# Patient Record
Sex: Female | Born: 1940
Health system: Southern US, Community
[De-identification: ages and names within clinical notes are randomized; demographics above are authoritative.]

## PROBLEM LIST (undated history)

## (undated) DIAGNOSIS — M810 Age-related osteoporosis without current pathological fracture: Secondary | ICD-10-CM

## (undated) DIAGNOSIS — J189 Pneumonia, unspecified organism: Secondary | ICD-10-CM

## (undated) DIAGNOSIS — H409 Unspecified glaucoma: Secondary | ICD-10-CM

## (undated) DIAGNOSIS — J449 Chronic obstructive pulmonary disease, unspecified: Secondary | ICD-10-CM

## (undated) DIAGNOSIS — K635 Polyp of colon: Secondary | ICD-10-CM

## (undated) DIAGNOSIS — E785 Hyperlipidemia, unspecified: Secondary | ICD-10-CM

## (undated) DIAGNOSIS — K2270 Barrett's esophagus without dysplasia: Secondary | ICD-10-CM

## (undated) DIAGNOSIS — I82409 Acute embolism and thrombosis of unspecified deep veins of unspecified lower extremity: Secondary | ICD-10-CM

## (undated) DIAGNOSIS — K269 Duodenal ulcer, unspecified as acute or chronic, without hemorrhage or perforation: Secondary | ICD-10-CM

## (undated) DIAGNOSIS — K8020 Calculus of gallbladder without cholecystitis without obstruction: Secondary | ICD-10-CM

## (undated) DIAGNOSIS — H3520 Other non-diabetic proliferative retinopathy, unspecified eye: Secondary | ICD-10-CM

## (undated) DIAGNOSIS — K219 Gastro-esophageal reflux disease without esophagitis: Secondary | ICD-10-CM

## (undated) DIAGNOSIS — K559 Vascular disorder of intestine, unspecified: Secondary | ICD-10-CM

## (undated) DIAGNOSIS — N281 Cyst of kidney, acquired: Secondary | ICD-10-CM

## (undated) HISTORY — DX: Acute embolism and thrombosis of unspecified deep veins of unspecified lower extremity: I82.409

## (undated) HISTORY — DX: Hyperlipidemia, unspecified: E78.5

## (undated) HISTORY — DX: Age-related osteoporosis without current pathological fracture: M81.0

## (undated) HISTORY — DX: Barrett's esophagus without dysplasia: K22.70

## (undated) HISTORY — PX: FEMORAL-POPLITEAL BYPASS GRAFT: SHX937

## (undated) HISTORY — DX: Polyp of colon: K63.5

## (undated) HISTORY — DX: Duodenal ulcer, unspecified as acute or chronic, without hemorrhage or perforation: K26.9

## (undated) HISTORY — DX: Calculus of gallbladder without cholecystitis without obstruction: K80.20

## (undated) HISTORY — DX: Vascular disorder of intestine, unspecified: K55.9

## (undated) HISTORY — PX: OTHER SURGICAL HISTORY: SHX169

## (undated) HISTORY — DX: Chronic obstructive pulmonary disease, unspecified: J44.9

## (undated) HISTORY — DX: Cyst of kidney, acquired: N28.1

## (undated) HISTORY — PX: CHOLECYSTECTOMY: SHX55

## (undated) HISTORY — DX: Unspecified glaucoma: H40.9

## (undated) HISTORY — DX: Other non-diabetic proliferative retinopathy, unspecified eye: H35.20

---

## 2014-04-23 DIAGNOSIS — E1159 Type 2 diabetes mellitus with other circulatory complications: Secondary | ICD-10-CM | POA: Diagnosis not present

## 2014-04-30 DIAGNOSIS — L852 Keratosis punctata (palmaris et plantaris): Secondary | ICD-10-CM | POA: Diagnosis not present

## 2014-04-30 DIAGNOSIS — M2041 Other hammer toe(s) (acquired), right foot: Secondary | ICD-10-CM | POA: Diagnosis not present

## 2014-04-30 DIAGNOSIS — I70203 Unspecified atherosclerosis of native arteries of extremities, bilateral legs: Secondary | ICD-10-CM | POA: Diagnosis not present

## 2014-04-30 DIAGNOSIS — B351 Tinea unguium: Secondary | ICD-10-CM | POA: Diagnosis not present

## 2014-05-01 DIAGNOSIS — I70219 Atherosclerosis of native arteries of extremities with intermittent claudication, unspecified extremity: Secondary | ICD-10-CM | POA: Diagnosis not present

## 2014-05-01 DIAGNOSIS — E1159 Type 2 diabetes mellitus with other circulatory complications: Secondary | ICD-10-CM | POA: Diagnosis not present

## 2014-05-01 DIAGNOSIS — J439 Emphysema, unspecified: Secondary | ICD-10-CM | POA: Diagnosis not present

## 2014-05-01 DIAGNOSIS — H4011X0 Primary open-angle glaucoma, stage unspecified: Secondary | ICD-10-CM | POA: Diagnosis not present

## 2014-05-04 DIAGNOSIS — D751 Secondary polycythemia: Secondary | ICD-10-CM | POA: Diagnosis not present

## 2014-05-04 DIAGNOSIS — D45 Polycythemia vera: Secondary | ICD-10-CM | POA: Diagnosis not present

## 2014-05-14 DIAGNOSIS — D45 Polycythemia vera: Secondary | ICD-10-CM | POA: Diagnosis not present

## 2014-05-17 DIAGNOSIS — H40003 Preglaucoma, unspecified, bilateral: Secondary | ICD-10-CM | POA: Diagnosis not present

## 2014-05-17 DIAGNOSIS — H43393 Other vitreous opacities, bilateral: Secondary | ICD-10-CM | POA: Diagnosis not present

## 2014-05-17 DIAGNOSIS — H2512 Age-related nuclear cataract, left eye: Secondary | ICD-10-CM | POA: Diagnosis not present

## 2014-05-17 DIAGNOSIS — Z961 Presence of intraocular lens: Secondary | ICD-10-CM | POA: Diagnosis not present

## 2014-05-17 DIAGNOSIS — R1031 Right lower quadrant pain: Secondary | ICD-10-CM | POA: Diagnosis not present

## 2014-05-17 DIAGNOSIS — H5213 Myopia, bilateral: Secondary | ICD-10-CM | POA: Diagnosis not present

## 2014-05-21 DIAGNOSIS — R1031 Right lower quadrant pain: Secondary | ICD-10-CM | POA: Diagnosis not present

## 2014-05-31 DIAGNOSIS — M810 Age-related osteoporosis without current pathological fracture: Secondary | ICD-10-CM | POA: Diagnosis not present

## 2014-05-31 DIAGNOSIS — J439 Emphysema, unspecified: Secondary | ICD-10-CM | POA: Diagnosis not present

## 2014-05-31 DIAGNOSIS — R0609 Other forms of dyspnea: Secondary | ICD-10-CM | POA: Diagnosis not present

## 2014-05-31 DIAGNOSIS — F17210 Nicotine dependence, cigarettes, uncomplicated: Secondary | ICD-10-CM | POA: Diagnosis not present

## 2014-06-26 DIAGNOSIS — H40003 Preglaucoma, unspecified, bilateral: Secondary | ICD-10-CM | POA: Diagnosis not present

## 2014-06-27 DIAGNOSIS — H402221 Chronic angle-closure glaucoma, left eye, mild stage: Secondary | ICD-10-CM | POA: Diagnosis not present

## 2014-06-27 DIAGNOSIS — E119 Type 2 diabetes mellitus without complications: Secondary | ICD-10-CM | POA: Diagnosis not present

## 2014-06-27 DIAGNOSIS — H43393 Other vitreous opacities, bilateral: Secondary | ICD-10-CM | POA: Diagnosis not present

## 2014-06-27 DIAGNOSIS — H2620 Unspecified complicated cataract: Secondary | ICD-10-CM | POA: Diagnosis not present

## 2014-06-29 DIAGNOSIS — M533 Sacrococcygeal disorders, not elsewhere classified: Secondary | ICD-10-CM | POA: Diagnosis not present

## 2014-06-29 DIAGNOSIS — S300XXA Contusion of lower back and pelvis, initial encounter: Secondary | ICD-10-CM | POA: Diagnosis not present

## 2014-06-29 DIAGNOSIS — M47816 Spondylosis without myelopathy or radiculopathy, lumbar region: Secondary | ICD-10-CM | POA: Diagnosis not present

## 2014-06-29 DIAGNOSIS — W19XXXA Unspecified fall, initial encounter: Secondary | ICD-10-CM | POA: Diagnosis not present

## 2014-07-02 DIAGNOSIS — L852 Keratosis punctata (palmaris et plantaris): Secondary | ICD-10-CM | POA: Diagnosis not present

## 2014-07-02 DIAGNOSIS — B351 Tinea unguium: Secondary | ICD-10-CM | POA: Diagnosis not present

## 2014-07-02 DIAGNOSIS — I70203 Unspecified atherosclerosis of native arteries of extremities, bilateral legs: Secondary | ICD-10-CM | POA: Diagnosis not present

## 2014-07-02 DIAGNOSIS — M2041 Other hammer toe(s) (acquired), right foot: Secondary | ICD-10-CM | POA: Diagnosis not present

## 2014-07-16 DIAGNOSIS — M810 Age-related osteoporosis without current pathological fracture: Secondary | ICD-10-CM | POA: Diagnosis not present

## 2014-07-16 DIAGNOSIS — R5383 Other fatigue: Secondary | ICD-10-CM | POA: Diagnosis not present

## 2014-07-16 DIAGNOSIS — E785 Hyperlipidemia, unspecified: Secondary | ICD-10-CM | POA: Diagnosis not present

## 2014-07-16 DIAGNOSIS — E1159 Type 2 diabetes mellitus with other circulatory complications: Secondary | ICD-10-CM | POA: Diagnosis not present

## 2014-07-23 DIAGNOSIS — E1151 Type 2 diabetes mellitus with diabetic peripheral angiopathy without gangrene: Secondary | ICD-10-CM | POA: Diagnosis not present

## 2014-07-23 DIAGNOSIS — S300XXA Contusion of lower back and pelvis, initial encounter: Secondary | ICD-10-CM | POA: Diagnosis not present

## 2014-07-23 DIAGNOSIS — K2270 Barrett's esophagus without dysplasia: Secondary | ICD-10-CM | POA: Diagnosis not present

## 2014-07-23 DIAGNOSIS — J439 Emphysema, unspecified: Secondary | ICD-10-CM | POA: Diagnosis not present

## 2014-07-31 DIAGNOSIS — J449 Chronic obstructive pulmonary disease, unspecified: Secondary | ICD-10-CM | POA: Diagnosis not present

## 2014-07-31 DIAGNOSIS — H4020X0 Unspecified primary angle-closure glaucoma, stage unspecified: Secondary | ICD-10-CM | POA: Diagnosis not present

## 2014-07-31 DIAGNOSIS — F17200 Nicotine dependence, unspecified, uncomplicated: Secondary | ICD-10-CM | POA: Diagnosis not present

## 2014-07-31 DIAGNOSIS — E119 Type 2 diabetes mellitus without complications: Secondary | ICD-10-CM | POA: Diagnosis not present

## 2014-07-31 DIAGNOSIS — H40212 Acute angle-closure glaucoma, left eye: Secondary | ICD-10-CM | POA: Diagnosis not present

## 2014-08-22 DIAGNOSIS — H402231 Chronic angle-closure glaucoma, bilateral, mild stage: Secondary | ICD-10-CM | POA: Diagnosis not present

## 2014-08-23 DIAGNOSIS — F17210 Nicotine dependence, cigarettes, uncomplicated: Secondary | ICD-10-CM | POA: Diagnosis not present

## 2014-08-23 DIAGNOSIS — J439 Emphysema, unspecified: Secondary | ICD-10-CM | POA: Diagnosis not present

## 2014-08-23 DIAGNOSIS — M810 Age-related osteoporosis without current pathological fracture: Secondary | ICD-10-CM | POA: Diagnosis not present

## 2014-08-23 DIAGNOSIS — R0609 Other forms of dyspnea: Secondary | ICD-10-CM | POA: Diagnosis not present

## 2014-09-05 DIAGNOSIS — D750 Familial erythrocytosis: Secondary | ICD-10-CM | POA: Diagnosis not present

## 2014-09-17 DIAGNOSIS — D45 Polycythemia vera: Secondary | ICD-10-CM | POA: Diagnosis not present

## 2014-10-04 DIAGNOSIS — L853 Xerosis cutis: Secondary | ICD-10-CM | POA: Diagnosis not present

## 2014-10-04 DIAGNOSIS — B353 Tinea pedis: Secondary | ICD-10-CM | POA: Diagnosis not present

## 2014-10-04 DIAGNOSIS — M2041 Other hammer toe(s) (acquired), right foot: Secondary | ICD-10-CM | POA: Diagnosis not present

## 2014-10-04 DIAGNOSIS — I70203 Unspecified atherosclerosis of native arteries of extremities, bilateral legs: Secondary | ICD-10-CM | POA: Diagnosis not present

## 2014-10-04 DIAGNOSIS — L852 Keratosis punctata (palmaris et plantaris): Secondary | ICD-10-CM | POA: Diagnosis not present

## 2014-10-04 DIAGNOSIS — B351 Tinea unguium: Secondary | ICD-10-CM | POA: Diagnosis not present

## 2014-10-18 DIAGNOSIS — E1151 Type 2 diabetes mellitus with diabetic peripheral angiopathy without gangrene: Secondary | ICD-10-CM | POA: Diagnosis not present

## 2014-10-18 DIAGNOSIS — S300XXA Contusion of lower back and pelvis, initial encounter: Secondary | ICD-10-CM | POA: Diagnosis not present

## 2014-10-31 DIAGNOSIS — K2270 Barrett's esophagus without dysplasia: Secondary | ICD-10-CM | POA: Diagnosis not present

## 2014-10-31 DIAGNOSIS — K267 Chronic duodenal ulcer without hemorrhage or perforation: Secondary | ICD-10-CM | POA: Diagnosis not present

## 2014-10-31 DIAGNOSIS — Z0000 Encounter for general adult medical examination without abnormal findings: Secondary | ICD-10-CM | POA: Diagnosis not present

## 2014-10-31 DIAGNOSIS — M533 Sacrococcygeal disorders, not elsewhere classified: Secondary | ICD-10-CM | POA: Diagnosis not present

## 2014-11-01 DIAGNOSIS — Z86010 Personal history of colonic polyps: Secondary | ICD-10-CM | POA: Diagnosis not present

## 2014-11-01 DIAGNOSIS — R1013 Epigastric pain: Secondary | ICD-10-CM | POA: Diagnosis not present

## 2014-11-20 DIAGNOSIS — L723 Sebaceous cyst: Secondary | ICD-10-CM | POA: Diagnosis not present

## 2014-12-03 DIAGNOSIS — D45 Polycythemia vera: Secondary | ICD-10-CM | POA: Diagnosis not present

## 2014-12-04 DIAGNOSIS — D45 Polycythemia vera: Secondary | ICD-10-CM | POA: Diagnosis not present

## 2014-12-06 DIAGNOSIS — B351 Tinea unguium: Secondary | ICD-10-CM | POA: Diagnosis not present

## 2014-12-06 DIAGNOSIS — I70203 Unspecified atherosclerosis of native arteries of extremities, bilateral legs: Secondary | ICD-10-CM | POA: Diagnosis not present

## 2014-12-06 DIAGNOSIS — L852 Keratosis punctata (palmaris et plantaris): Secondary | ICD-10-CM | POA: Diagnosis not present

## 2014-12-06 DIAGNOSIS — M2041 Other hammer toe(s) (acquired), right foot: Secondary | ICD-10-CM | POA: Diagnosis not present

## 2014-12-10 DIAGNOSIS — D45 Polycythemia vera: Secondary | ICD-10-CM | POA: Diagnosis not present

## 2014-12-18 DIAGNOSIS — D125 Benign neoplasm of sigmoid colon: Secondary | ICD-10-CM | POA: Diagnosis not present

## 2014-12-18 DIAGNOSIS — Z86718 Personal history of other venous thrombosis and embolism: Secondary | ICD-10-CM | POA: Diagnosis not present

## 2014-12-18 DIAGNOSIS — K2950 Unspecified chronic gastritis without bleeding: Secondary | ICD-10-CM | POA: Diagnosis not present

## 2014-12-18 DIAGNOSIS — K5730 Diverticulosis of large intestine without perforation or abscess without bleeding: Secondary | ICD-10-CM | POA: Diagnosis not present

## 2014-12-18 DIAGNOSIS — K640 First degree hemorrhoids: Secondary | ICD-10-CM | POA: Diagnosis not present

## 2014-12-18 DIAGNOSIS — J449 Chronic obstructive pulmonary disease, unspecified: Secondary | ICD-10-CM | POA: Diagnosis not present

## 2014-12-18 DIAGNOSIS — J45909 Unspecified asthma, uncomplicated: Secondary | ICD-10-CM | POA: Diagnosis not present

## 2014-12-18 DIAGNOSIS — K2900 Acute gastritis without bleeding: Secondary | ICD-10-CM | POA: Diagnosis not present

## 2014-12-18 DIAGNOSIS — Z86010 Personal history of colonic polyps: Secondary | ICD-10-CM | POA: Diagnosis not present

## 2014-12-18 DIAGNOSIS — R1013 Epigastric pain: Secondary | ICD-10-CM | POA: Diagnosis not present

## 2014-12-18 DIAGNOSIS — K219 Gastro-esophageal reflux disease without esophagitis: Secondary | ICD-10-CM | POA: Diagnosis not present

## 2014-12-18 DIAGNOSIS — F419 Anxiety disorder, unspecified: Secondary | ICD-10-CM | POA: Diagnosis not present

## 2014-12-18 DIAGNOSIS — F329 Major depressive disorder, single episode, unspecified: Secondary | ICD-10-CM | POA: Diagnosis not present

## 2015-01-10 DIAGNOSIS — F17200 Nicotine dependence, unspecified, uncomplicated: Secondary | ICD-10-CM | POA: Diagnosis not present

## 2015-01-10 DIAGNOSIS — M810 Age-related osteoporosis without current pathological fracture: Secondary | ICD-10-CM | POA: Diagnosis not present

## 2015-01-10 DIAGNOSIS — J439 Emphysema, unspecified: Secondary | ICD-10-CM | POA: Diagnosis not present

## 2015-01-10 DIAGNOSIS — R0609 Other forms of dyspnea: Secondary | ICD-10-CM | POA: Diagnosis not present

## 2015-01-28 DIAGNOSIS — D45 Polycythemia vera: Secondary | ICD-10-CM | POA: Diagnosis not present

## 2015-01-28 DIAGNOSIS — E1151 Type 2 diabetes mellitus with diabetic peripheral angiopathy without gangrene: Secondary | ICD-10-CM | POA: Diagnosis not present

## 2015-02-05 DIAGNOSIS — F17200 Nicotine dependence, unspecified, uncomplicated: Secondary | ICD-10-CM | POA: Diagnosis not present

## 2015-02-05 DIAGNOSIS — J432 Centrilobular emphysema: Secondary | ICD-10-CM | POA: Diagnosis not present

## 2015-02-05 DIAGNOSIS — R918 Other nonspecific abnormal finding of lung field: Secondary | ICD-10-CM | POA: Diagnosis not present

## 2015-02-05 DIAGNOSIS — Z122 Encounter for screening for malignant neoplasm of respiratory organs: Secondary | ICD-10-CM | POA: Diagnosis not present

## 2015-02-05 DIAGNOSIS — Z1231 Encounter for screening mammogram for malignant neoplasm of breast: Secondary | ICD-10-CM | POA: Diagnosis not present

## 2015-02-06 DIAGNOSIS — K2270 Barrett's esophagus without dysplasia: Secondary | ICD-10-CM | POA: Diagnosis not present

## 2015-02-06 DIAGNOSIS — H402290 Chronic angle-closure glaucoma, unspecified eye, stage unspecified: Secondary | ICD-10-CM | POA: Diagnosis not present

## 2015-02-06 DIAGNOSIS — J439 Emphysema, unspecified: Secondary | ICD-10-CM | POA: Diagnosis not present

## 2015-02-06 DIAGNOSIS — H40239 Intermittent angle-closure glaucoma, unspecified eye: Secondary | ICD-10-CM | POA: Diagnosis not present

## 2015-02-06 DIAGNOSIS — R5382 Chronic fatigue, unspecified: Secondary | ICD-10-CM | POA: Diagnosis not present

## 2015-02-06 DIAGNOSIS — E1151 Type 2 diabetes mellitus with diabetic peripheral angiopathy without gangrene: Secondary | ICD-10-CM | POA: Diagnosis not present

## 2015-02-06 DIAGNOSIS — Z23 Encounter for immunization: Secondary | ICD-10-CM | POA: Diagnosis not present

## 2015-02-11 DIAGNOSIS — D45 Polycythemia vera: Secondary | ICD-10-CM | POA: Diagnosis not present

## 2015-02-14 DIAGNOSIS — I70203 Unspecified atherosclerosis of native arteries of extremities, bilateral legs: Secondary | ICD-10-CM | POA: Diagnosis not present

## 2015-02-14 DIAGNOSIS — L852 Keratosis punctata (palmaris et plantaris): Secondary | ICD-10-CM | POA: Diagnosis not present

## 2015-02-14 DIAGNOSIS — B351 Tinea unguium: Secondary | ICD-10-CM | POA: Diagnosis not present

## 2015-02-22 DIAGNOSIS — Z780 Asymptomatic menopausal state: Secondary | ICD-10-CM | POA: Diagnosis not present

## 2015-02-22 DIAGNOSIS — M810 Age-related osteoporosis without current pathological fracture: Secondary | ICD-10-CM | POA: Diagnosis not present

## 2015-02-26 DIAGNOSIS — H402231 Chronic angle-closure glaucoma, bilateral, mild stage: Secondary | ICD-10-CM | POA: Diagnosis not present

## 2015-03-05 DIAGNOSIS — I70203 Unspecified atherosclerosis of native arteries of extremities, bilateral legs: Secondary | ICD-10-CM | POA: Diagnosis not present

## 2015-03-13 DIAGNOSIS — E119 Type 2 diabetes mellitus without complications: Secondary | ICD-10-CM | POA: Diagnosis not present

## 2015-03-13 DIAGNOSIS — H43393 Other vitreous opacities, bilateral: Secondary | ICD-10-CM | POA: Diagnosis not present

## 2015-03-13 DIAGNOSIS — H402221 Chronic angle-closure glaucoma, left eye, mild stage: Secondary | ICD-10-CM | POA: Diagnosis not present

## 2015-03-13 DIAGNOSIS — H2512 Age-related nuclear cataract, left eye: Secondary | ICD-10-CM | POA: Diagnosis not present

## 2015-03-18 DIAGNOSIS — I70203 Unspecified atherosclerosis of native arteries of extremities, bilateral legs: Secondary | ICD-10-CM | POA: Diagnosis not present

## 2015-03-18 DIAGNOSIS — Z6825 Body mass index (BMI) 25.0-25.9, adult: Secondary | ICD-10-CM | POA: Diagnosis not present

## 2015-04-09 DIAGNOSIS — D45 Polycythemia vera: Secondary | ICD-10-CM | POA: Diagnosis not present

## 2015-04-09 DIAGNOSIS — E1151 Type 2 diabetes mellitus with diabetic peripheral angiopathy without gangrene: Secondary | ICD-10-CM | POA: Diagnosis not present

## 2015-04-17 DIAGNOSIS — I70219 Atherosclerosis of native arteries of extremities with intermittent claudication, unspecified extremity: Secondary | ICD-10-CM | POA: Diagnosis not present

## 2015-04-17 DIAGNOSIS — M810 Age-related osteoporosis without current pathological fracture: Secondary | ICD-10-CM | POA: Diagnosis not present

## 2015-04-17 DIAGNOSIS — E1151 Type 2 diabetes mellitus with diabetic peripheral angiopathy without gangrene: Secondary | ICD-10-CM | POA: Diagnosis not present

## 2015-04-17 DIAGNOSIS — J439 Emphysema, unspecified: Secondary | ICD-10-CM | POA: Diagnosis not present

## 2015-04-17 DIAGNOSIS — K2270 Barrett's esophagus without dysplasia: Secondary | ICD-10-CM | POA: Diagnosis not present

## 2015-04-17 DIAGNOSIS — M47812 Spondylosis without myelopathy or radiculopathy, cervical region: Secondary | ICD-10-CM | POA: Diagnosis not present

## 2015-04-17 DIAGNOSIS — M489 Spondylopathy, unspecified: Secondary | ICD-10-CM | POA: Diagnosis not present

## 2015-04-22 DIAGNOSIS — E611 Iron deficiency: Secondary | ICD-10-CM | POA: Diagnosis not present

## 2015-04-22 DIAGNOSIS — D45 Polycythemia vera: Secondary | ICD-10-CM | POA: Diagnosis not present

## 2015-04-26 DIAGNOSIS — R0609 Other forms of dyspnea: Secondary | ICD-10-CM | POA: Diagnosis not present

## 2015-04-26 DIAGNOSIS — J431 Panlobular emphysema: Secondary | ICD-10-CM | POA: Diagnosis not present

## 2015-04-26 DIAGNOSIS — Z716 Tobacco abuse counseling: Secondary | ICD-10-CM | POA: Diagnosis not present

## 2015-04-26 DIAGNOSIS — F17210 Nicotine dependence, cigarettes, uncomplicated: Secondary | ICD-10-CM | POA: Diagnosis not present

## 2015-04-26 DIAGNOSIS — M810 Age-related osteoporosis without current pathological fracture: Secondary | ICD-10-CM | POA: Diagnosis not present

## 2015-05-02 DIAGNOSIS — I70203 Unspecified atherosclerosis of native arteries of extremities, bilateral legs: Secondary | ICD-10-CM | POA: Diagnosis not present

## 2015-05-02 DIAGNOSIS — L852 Keratosis punctata (palmaris et plantaris): Secondary | ICD-10-CM | POA: Diagnosis not present

## 2015-05-02 DIAGNOSIS — B351 Tinea unguium: Secondary | ICD-10-CM | POA: Diagnosis not present

## 2015-07-10 DIAGNOSIS — M810 Age-related osteoporosis without current pathological fracture: Secondary | ICD-10-CM | POA: Diagnosis not present

## 2015-07-10 DIAGNOSIS — D45 Polycythemia vera: Secondary | ICD-10-CM | POA: Diagnosis not present

## 2015-07-10 DIAGNOSIS — E1151 Type 2 diabetes mellitus with diabetic peripheral angiopathy without gangrene: Secondary | ICD-10-CM | POA: Diagnosis not present

## 2015-07-10 DIAGNOSIS — R5382 Chronic fatigue, unspecified: Secondary | ICD-10-CM | POA: Diagnosis not present

## 2015-07-11 DIAGNOSIS — I70203 Unspecified atherosclerosis of native arteries of extremities, bilateral legs: Secondary | ICD-10-CM | POA: Diagnosis not present

## 2015-07-11 DIAGNOSIS — B351 Tinea unguium: Secondary | ICD-10-CM | POA: Diagnosis not present

## 2015-07-11 DIAGNOSIS — L852 Keratosis punctata (palmaris et plantaris): Secondary | ICD-10-CM | POA: Diagnosis not present

## 2015-07-17 DIAGNOSIS — D751 Secondary polycythemia: Secondary | ICD-10-CM | POA: Diagnosis not present

## 2015-07-17 DIAGNOSIS — L209 Atopic dermatitis, unspecified: Secondary | ICD-10-CM | POA: Diagnosis not present

## 2015-07-17 DIAGNOSIS — E1151 Type 2 diabetes mellitus with diabetic peripheral angiopathy without gangrene: Secondary | ICD-10-CM | POA: Diagnosis not present

## 2015-07-17 DIAGNOSIS — J439 Emphysema, unspecified: Secondary | ICD-10-CM | POA: Diagnosis not present

## 2015-07-17 DIAGNOSIS — E785 Hyperlipidemia, unspecified: Secondary | ICD-10-CM | POA: Diagnosis not present

## 2015-07-22 DIAGNOSIS — R918 Other nonspecific abnormal finding of lung field: Secondary | ICD-10-CM | POA: Diagnosis not present

## 2015-07-22 DIAGNOSIS — D45 Polycythemia vera: Secondary | ICD-10-CM | POA: Diagnosis not present

## 2015-08-28 DIAGNOSIS — E113293 Type 2 diabetes mellitus with mild nonproliferative diabetic retinopathy without macular edema, bilateral: Secondary | ICD-10-CM | POA: Diagnosis not present

## 2015-08-28 DIAGNOSIS — H43393 Other vitreous opacities, bilateral: Secondary | ICD-10-CM | POA: Diagnosis not present

## 2015-08-28 DIAGNOSIS — Z961 Presence of intraocular lens: Secondary | ICD-10-CM | POA: Diagnosis not present

## 2015-08-28 DIAGNOSIS — H402231 Chronic angle-closure glaucoma, bilateral, mild stage: Secondary | ICD-10-CM | POA: Diagnosis not present

## 2015-10-03 DIAGNOSIS — J449 Chronic obstructive pulmonary disease, unspecified: Secondary | ICD-10-CM | POA: Diagnosis not present

## 2015-10-03 DIAGNOSIS — R0609 Other forms of dyspnea: Secondary | ICD-10-CM | POA: Diagnosis not present

## 2015-11-14 ENCOUNTER — Encounter: Payer: Self-pay | Admitting: Family Medicine

## 2015-11-25 ENCOUNTER — Encounter: Payer: Self-pay | Admitting: Family Medicine

## 2015-11-25 ENCOUNTER — Ambulatory Visit (INDEPENDENT_AMBULATORY_CARE_PROVIDER_SITE_OTHER): Payer: Medicare Other | Admitting: Family Medicine

## 2015-11-25 VITALS — BP 148/72 | HR 80 | Temp 98.4°F | Resp 20 | Ht 64.0 in | Wt 141.0 lb

## 2015-11-25 DIAGNOSIS — E785 Hyperlipidemia, unspecified: Secondary | ICD-10-CM | POA: Diagnosis not present

## 2015-11-25 DIAGNOSIS — M545 Low back pain: Secondary | ICD-10-CM | POA: Diagnosis not present

## 2015-11-25 DIAGNOSIS — F411 Generalized anxiety disorder: Secondary | ICD-10-CM | POA: Diagnosis not present

## 2015-11-25 DIAGNOSIS — Z7189 Other specified counseling: Secondary | ICD-10-CM

## 2015-11-25 DIAGNOSIS — J449 Chronic obstructive pulmonary disease, unspecified: Secondary | ICD-10-CM

## 2015-11-25 DIAGNOSIS — E119 Type 2 diabetes mellitus without complications: Secondary | ICD-10-CM

## 2015-11-25 DIAGNOSIS — Z7689 Persons encountering health services in other specified circumstances: Secondary | ICD-10-CM

## 2015-11-25 DIAGNOSIS — G8929 Other chronic pain: Secondary | ICD-10-CM

## 2015-11-25 MED ORDER — MICROLET LANCETS MISC: 11 refills | Status: DC

## 2015-11-25 MED ORDER — ESCITALOPRAM OXALATE 10 MG PO TABS: 10.0000 mg | ORAL_TABLET | Freq: Every day | ORAL | 5 refills | Status: DC

## 2015-11-25 MED ORDER — OMEPRAZOLE 20 MG PO CPDR: 20.0000 mg | DELAYED_RELEASE_CAPSULE | Freq: Two times a day (BID) | ORAL | 11 refills | Status: DC

## 2015-11-25 MED ORDER — ALENDRONATE SODIUM 70 MG PO TABS: 70.0000 mg | ORAL_TABLET | ORAL | 11 refills | Status: DC

## 2015-11-25 NOTE — Progress Notes (Signed)
Subjective:    Patient ID: Briana Barr, female    DOB: Mar 02, 1941, 75 y.o.   MRN: [id number]  HPI Patient is a 75 year old white female here today to establish care. She has oxygen dependent end-stage COPD. She is on 2 L via nasal cannula constantly. She continues to smoke. She is also on a combination of Pulmicort, Brovana, and Spiriva. She reports several exacerbations every year requiring antibiotics and steroids. She also has type 2 diabetes mellitus currently controlled with glimepiride. She reports frequent symptoms of hypoglycemia. Her fasting blood sugars are typically between 101 120. She also reports chronic low back pain. This seems to be her worst symptom. She reports a history of degenerative disc disease in lower back. She has a remote history of surgery to remove a "bone spur from pinching a nerve". She cannot recollect how long it's been since that surgery but it is been many years ago. She takes pain medication 3 times a day to help control the pain. She has not had any x-rays done of her lower back in many years. She denies any neuropathy in her legs or leg weakness. She also reports Briana anxiety problems. She takes Xanax 3 times a day to control this. She has not tried and failed any preventative medication such as Prozac or Lexapro. Without the medication she has withdrawal type symptoms and uncontrolled anxiety. She also has a history of osteoporosis for which she takes Fosamax. Her colonoscopy is up-to-date. She is due for mammogram. She does not require Pap smears any longer because of her age.  Past Medical History:  Diagnosis Date  . Anxiety   . Arthritis    chronic low back pain (remote surgery)  . COPD (chronic obstructive pulmonary disease) ([place])   . DDD (degenerative disc disease), cervical   . DDD (degenerative disc disease), lumbar   . Diabetes mellitus without complication ([place])   . Diabetic nephropathy ([place])   . Duodenal ulcer   . Glaucoma   . Hyperlipidemia     . Osteoporosis   . Retinopathy, background, proliferative    Past Surgical History:  Procedure Laterality Date  . cervical vertebral fusion     c5-6, 6-7  . CHOLECYSTECTOMY    . FEMORAL-POPLITEAL BYPASS GRAFT      Current Outpatient Prescriptions on File Prior to Visit  Medication Sig Dispense Refill  . ACETAMINOPHEN-CODEINE #3 PO Take 1 tablet by mouth 3 (three) times Briana as needed.    Marland Kitchen acetaZOLAMIDE (DIAMOX) 250 MG tablet Take 250 mg by mouth Briana.    Marland Kitchen albuterol (PROAIR HFA) 108 (90 Base) MCG/ACT inhaler Inhale into the lungs every 6 (six) hours as needed for wheezing or shortness of breath.    . Aloe-Sodium Chloride (AYR SALINE NASAL GEL NA) Place into the nose 2 (two) times Briana.    Marland Kitchen ALPRAZolam (XANAX) 0.5 MG tablet Take 0.5 mg by mouth 3 (three) times Briana as needed for anxiety.    Marland Kitchen arformoterol (BROVANA) 15 MCG/2ML NEBU Take 15 mcg by nebulization 2 (two) times Briana.    Marland Kitchen aspirin 81 MG tablet Take 81 mg by mouth Briana.    . budesonide (PULMICORT) 0.5 MG/2ML nebulizer solution Take 0.5 mg by nebulization 2 (two) times Briana.    . Cholecalciferol (VITAMIN D3) 1000 units CAPS Take 1 capsule by mouth Briana.    Marland Kitchen CINNAMON PO Take 1,000 mg by mouth Briana.    . clotrimazole-betamethasone (LOTRISONE) cream Apply 1 application topically 2 (two) times Briana. To feet    .  Dextromethorphan-Guaifenesin (DIABETIC TUSSIN DM PO) Take by mouth at bedtime as needed.    Marland Kitchen dextromethorphan-guaiFENesin (MUCINEX DM) 30-600 MG 12hr tablet Take 1 tablet by mouth Briana as needed for cough.    . docusate sodium (COLACE) 100 MG capsule Take 100 mg by mouth 2 (two) times Briana as needed for mild constipation.    . fluticasone (FLONASE) 50 MCG/ACT nasal spray Place 2 sprays into both nostrils Briana.    Marland Kitchen glimepiride (AMARYL) 1 MG tablet Take 1 mg by mouth Briana with breakfast.    . glucose blood test strip 1 each by Other route as needed for other. Contour test strips Use as instructed    .  Multiple Vitamin (MULTIVITAMIN) tablet Take 1 tablet by mouth Briana. Centrum Silver 50+ women    . Omega-3 Fatty Acids (FISH OIL) 1000 MG CPDR Take 1,000 mg by mouth Briana.    Marland Kitchen Propylene Glycol (SYSTANE BALANCE OP) Apply to eye 2 (two) times Briana.    . pseudoephedrine ([employer] NASAL DECONGESTANT) 30 MG tablet Take 30 mg by mouth 2 (two) times Briana as needed for congestion.    . simvastatin (ZOCOR) 40 MG tablet Take 40 mg by mouth Briana at 6 PM.    . sodium chloride (OCEAN) 0.65 % SOLN nasal spray Place 1 spray into both nostrils as needed for congestion.    Marland Kitchen tiotropium (SPIRIVA) 18 MCG inhalation capsule Place 18 mcg into inhaler and inhale Briana.     No current facility-administered medications on file prior to visit.    Allergies  Allergen Reactions  . Vioxx [Rofecoxib] Swelling   Social History   Social History  . Marital status: Single    Spouse name: N/A  . Number of children: N/A  . Years of education: N/A   Occupational History  . Not on file.   Social History Main Topics  . Smoking status: Current Some Day Smoker    Types: Cigarettes  . Smokeless tobacco: Never Used  . Alcohol use No  . Drug use: No  . Sexual activity: Not on file   Other Topics Concern  . Not on file   Social History Narrative  . No narrative on file   No family history on file.    Review of Systems  All other systems reviewed and are negative.      Objective:   Physical Exam  Constitutional: She is oriented to person, place, and time. She appears well-developed and well-nourished. No distress.  HENT:  Head: Normocephalic and atraumatic.  Right Ear: External ear normal.  Left Ear: External ear normal.  Nose: Nose normal.  Mouth/Throat: Oropharynx is clear and moist. No oropharyngeal exudate.  Eyes: Conjunctivae are normal. Right eye exhibits no discharge. Left eye exhibits no discharge. No scleral icterus.  Neck: Neck supple. No JVD present. No thyromegaly present.  Cardiovascular:  Normal rate, regular rhythm, normal heart sounds and intact distal pulses.  Exam reveals no gallop and no friction rub.   No murmur heard. Pulmonary/Chest: Effort normal. She has wheezes.  Abdominal: Soft. Bowel sounds are normal. She exhibits no distension and no mass. There is no tenderness. There is no rebound and no guarding.  Musculoskeletal: She exhibits no edema.       Cervical back: She exhibits decreased range of motion and tenderness.       Lumbar back: She exhibits decreased range of motion and tenderness.  Lymphadenopathy:    She has no cervical adenopathy.  Neurological: She is alert and oriented  to person, place, and time. She has normal reflexes. She displays normal reflexes. No cranial nerve deficit. She exhibits normal muscle tone. Coordination normal.  Skin: Skin is warm. No rash noted. She is not diaphoretic. No erythema. No pallor.  Psychiatric: She has a normal mood and affect. Her behavior is normal. Judgment and thought content normal.  Vitals reviewed.         Assessment & Plan:  Establishing care with new doctor, encounter for  GAD (generalized anxiety disorder) - Plan: escitalopram (LEXAPRO) 10 MG tablet  COPD mixed type ([place])  Diabetes mellitus type II, non insulin dependent ([place]) - Plan: CBC with Differential/Platelet, COMPLETE METABOLIC PANEL WITH GFR, Hemoglobin A1c, Lipid panel  HLD (hyperlipidemia)  Chronic low back pain  Patient has numerous medical issues that I'm concerned about. #1 concerned about her uses Xanax 3 times a day and her use of pain medication 3 times a day coupled with her oxygen dependent COPD and her osteoporosis all of which makes her very high fall risk. I explained this in detail to the patient. She states that she needs the Xanax and that she withdraws and she does not get the medication 3 times a day. I recommended starting Lexapro 10 mg a day and rechecking in one month. Hopefully at that time we can start to wean the patient  back off the Xanax as the Lexapro takes effect. I would also like to obtain some baseline lab work including a CBC, CMP, fasting lipid panel, hemoglobin A1c. Her sugars sound well controlled but I do believe that glimepiride is a poor choice given his risk of hypoglycemia. Based on her lab work I would like to switch her to metformin as long as her not any contraindications. I would also like to obtain x-rays of lumbar spine to evaluate her low back pain further rather than just treated with pain medication. She is a very high surgical risk and we very well may need to keep her on chronic pain medication to manage her pain but I want to rule out other possible causes first.

## 2015-11-26 ENCOUNTER — Other Ambulatory Visit: Payer: Medicare Other

## 2015-11-26 DIAGNOSIS — E119 Type 2 diabetes mellitus without complications: Secondary | ICD-10-CM | POA: Diagnosis not present

## 2015-11-26 LAB — CBC WITH DIFFERENTIAL/PLATELET
BASOS PCT: 0 %
Basophils Absolute: 0 cells/uL (ref 0–200)
Eosinophils Absolute: 166 cells/uL (ref 15–500)
Eosinophils Relative: 2 %
HEMATOCRIT: 47.2 % — AB (ref 35.0–45.0)
Hemoglobin: 14.1 g/dL (ref 12.0–15.0)
LYMPHS ABS: 1328 {cells}/uL (ref 850–3900)
MCH: 25.5 pg — ABNORMAL LOW (ref 27.0–33.0)
MCHC: 29.9 g/dL — ABNORMAL LOW (ref 32.0–36.0)
MCV: 85.5 fL (ref 80.0–100.0)
MONOS PCT: 10 %
MPV: 10.6 fL (ref 7.5–12.5)
Monocytes Absolute: 830 cells/uL (ref 200–950)
NEUTROS PCT: 72 %
Neutro Abs: 5976 cells/uL (ref 1500–7800)
PLATELETS: 361 10*3/uL (ref 140–400)
RBC: 5.52 MIL/uL — AB (ref 3.80–5.10)
RDW: 17.3 % — AB (ref 11.0–15.0)
WBC: 8.3 10*3/uL (ref 3.8–10.8)

## 2015-11-26 LAB — COMPLETE METABOLIC PANEL WITH GFR
ALBUMIN: 4.4 g/dL (ref 3.6–5.1)
ALK PHOS: 71 U/L (ref 33–130)
ALT: 21 U/L (ref 6–29)
AST: 20 U/L (ref 10–35)
BILIRUBIN TOTAL: 0.3 mg/dL (ref 0.2–1.2)
BUN: 17 mg/dL (ref 7–25)
CALCIUM: 9.4 mg/dL (ref 8.6–10.4)
CO2: 34 mmol/L — ABNORMAL HIGH (ref 20–31)
CREATININE: 0.48 mg/dL — AB (ref 0.60–0.93)
Chloride: 102 mmol/L (ref 98–110)
GFR, Est Non African American: >89 mL/min (ref >=60)
Glucose, Bld: 104 mg/dL — ABNORMAL HIGH (ref 70–99)
Potassium: 4.7 mmol/L (ref 3.5–5.3)
Sodium: 143 mmol/L (ref 135–146)
Total Protein: 7 g/dL (ref 6.1–8.1)

## 2015-11-26 LAB — LIPID PANEL
CHOL/HDL RATIO: 2.8 ratio (ref <=5.0)
Cholesterol: 169 mg/dL (ref 125–200)
HDL: 60 mg/dL (ref >=46)
LDL Cholesterol: 83 mg/dL (ref <130)
Triglycerides: 128 mg/dL (ref <150)
VLDL: 26 mg/dL (ref <30)

## 2015-11-27 ENCOUNTER — Other Ambulatory Visit: Payer: Self-pay | Admitting: Family Medicine

## 2015-11-27 DIAGNOSIS — M545 Low back pain: Secondary | ICD-10-CM

## 2015-11-27 LAB — HEMOGLOBIN A1C
HEMOGLOBIN A1C: 5.9 % — AB (ref <5.7)
Mean Plasma Glucose: 123 mg/dL

## 2015-11-29 ENCOUNTER — Other Ambulatory Visit: Payer: Self-pay

## 2015-11-29 MED ORDER — METFORMIN HCL 500 MG PO TABS: 500.0000 mg | ORAL_TABLET | Freq: Two times a day (BID) | ORAL | 3 refills | Status: DC

## 2015-12-04 ENCOUNTER — Ambulatory Visit
Admission: RE | Admit: 2015-12-04 | Discharge: 2015-12-04 | Disposition: A | Discharge status: Home / Self Care | Payer: Medicare Other | Source: Ambulatory Visit | Attending: Family Medicine | Admitting: Family Medicine

## 2015-12-04 DIAGNOSIS — M545 Low back pain: Secondary | ICD-10-CM

## 2015-12-04 DIAGNOSIS — M5136 Other intervertebral disc degeneration, lumbar region: Secondary | ICD-10-CM | POA: Diagnosis not present

## 2015-12-06 ENCOUNTER — Other Ambulatory Visit: Payer: Self-pay | Admitting: Family Medicine

## 2015-12-06 DIAGNOSIS — M5136 Other intervertebral disc degeneration, lumbar region: Secondary | ICD-10-CM

## 2015-12-11 ENCOUNTER — Other Ambulatory Visit: Payer: Self-pay | Admitting: *Deleted

## 2015-12-11 MED ORDER — HYDROCODONE-ACETAMINOPHEN 5-325 MG PO TABS: 1.0000 | ORAL_TABLET | Freq: Three times a day (TID) | ORAL | 0 refills | Status: DC | PRN

## 2016-01-02 ENCOUNTER — Ambulatory Visit (INDEPENDENT_AMBULATORY_CARE_PROVIDER_SITE_OTHER): Payer: Medicare Other | Admitting: Family Medicine

## 2016-01-02 ENCOUNTER — Encounter: Payer: Self-pay | Admitting: Family Medicine

## 2016-01-02 VITALS — BP 120/60 | HR 88 | Temp 99.1°F | Resp 20 | Ht 64.0 in | Wt 137.0 lb

## 2016-01-02 DIAGNOSIS — J449 Chronic obstructive pulmonary disease, unspecified: Secondary | ICD-10-CM

## 2016-01-02 DIAGNOSIS — R0609 Other forms of dyspnea: Secondary | ICD-10-CM

## 2016-01-02 NOTE — Progress Notes (Signed)
Subjective:    Patient ID: Briana Barr, female    DOB: 24-Sep-1940, 75 y.o.   MRN: [id number]  HPI  8/17 Patient is a 75 year old white female here today to establish care. She has oxygen dependent end-stage COPD. She is on 2 L via nasal cannula constantly. She continues to smoke. She is also on a combination of Pulmicort, Brovana, and Spiriva. She reports several exacerbations every year requiring antibiotics and steroids. She also has type 2 diabetes mellitus currently controlled with glimepiride. She reports frequent symptoms of hypoglycemia. Her fasting blood sugars are typically between 101 120. She also reports chronic low back pain. This seems to be her worst symptom. She reports a history of degenerative disc disease in lower back. She has a remote history of surgery to remove a "bone spur from pinching a nerve". She cannot recollect how long it's been since that surgery but it is been many years ago. She takes pain medication 3 times a day to help control the pain. She has not had any x-rays done of her lower back in many years. She denies any neuropathy in her legs or leg weakness. She also reports daily anxiety problems. She takes Xanax 3 times a day to control this. She has not tried and failed any preventative medication such as Prozac or Lexapro. Without the medication she has withdrawal type symptoms and uncontrolled anxiety. She also has a history of osteoporosis for which she takes Fosamax. Her colonoscopy is up-to-date. She is due for mammogram. She does not require Pap smears any longer because of her age.  At that time, my plan was: Patient has numerous medical issues that I'm concerned about. #1 concerned about her uses Xanax 3 times a day and her use of pain medication 3 times a day coupled with her oxygen dependent COPD and her osteoporosis all of which makes her very high fall risk. I explained this in detail to the patient. She states that she needs the Xanax and that she withdraws  and she does not get the medication 3 times a day. I recommended starting Lexapro 10 mg a day and rechecking in one month. Hopefully at that time we can start to wean the patient back off the Xanax as the Lexapro takes effect. I would also like to obtain some baseline lab work including a CBC, CMP, fasting lipid panel, hemoglobin A1c. Her sugars sound well controlled but I do believe that glimepiride is a poor choice given his risk of hypoglycemia. Based on her lab work I would like to switch her to metformin as long as her not any contraindications. I would also like to obtain x-rays of lumbar spine to evaluate her low back pain further rather than just treated with pain medication. She is a very high surgical risk and we very well may need to keep her on chronic pain medication to manage her pain but I want to rule out other possible causes first.  01/02/16 Patient reports worsening dyspnea on exertion over the last several months. Unfortunately she continues to smoke approximately one half a pack of cigarettes per day. She has severe COPD and is oxygen dependent. She is 98% on 2 L at rest. With ambulation the patient drops to 95% on 2 L however she becomes extremely winded and has to stop due to a combination of shortness of breath and also claudication symptoms in her legs. She has a history of peripheral vascular disease status post arterial bypass in her left leg certainly  raising the concern about possible coronary artery disease/heart disease. She denies any orthopnea. She denies any paroxysmal nocturnal dyspnea. There is no JVD today on examination. Her pulmonary exam shows diminished lung sounds bilaterally but no wheezes crackles [place]  Past Medical History:  Diagnosis Date  . Anxiety   . Arthritis    chronic low back pain (remote surgery)  . COPD (chronic obstructive pulmonary disease) ([place])   . DDD (degenerative disc disease), cervical   . DDD (degenerative disc disease), lumbar   . Diabetes  mellitus without complication ([place])   . Diabetic nephropathy ([place])   . Duodenal ulcer   . Glaucoma   . Hyperlipidemia   . Osteoporosis   . Retinopathy, background, proliferative    Past Surgical History:  Procedure Laterality Date  . cervical vertebral fusion     c5-6, 6-7  . CHOLECYSTECTOMY    . FEMORAL-POPLITEAL BYPASS GRAFT      Current Outpatient Prescriptions on File Prior to Visit  Medication Sig Dispense Refill  . acetaZOLAMIDE (DIAMOX) 250 MG tablet Take 250 mg by mouth daily.    Marland Kitchen albuterol (PROAIR HFA) 108 (90 Base) MCG/ACT inhaler Inhale into the lungs every 6 (six) hours as needed for wheezing or shortness of breath.    Marland Kitchen alendronate (FOSAMAX) 70 MG tablet Take 1 tablet (70 mg total) by mouth once a week. Take with a full glass of water on an empty stomach. 4 tablet 11  . Aloe-Sodium Chloride (AYR SALINE NASAL GEL NA) Place into the nose 2 (two) times daily.    Marland Kitchen ALPRAZolam (XANAX) 0.5 MG tablet Take 0.5 mg by mouth 3 (three) times daily as needed for anxiety.    Marland Kitchen arformoterol (BROVANA) 15 MCG/2ML NEBU Take 15 mcg by nebulization 2 (two) times daily.    Marland Kitchen aspirin 81 MG tablet Take 81 mg by mouth daily.    . budesonide (PULMICORT) 0.5 MG/2ML nebulizer solution Take 0.5 mg by nebulization 2 (two) times daily.    . Cholecalciferol (VITAMIN D3) 1000 units CAPS Take 1 capsule by mouth daily.    Marland Kitchen CINNAMON PO Take 1,000 mg by mouth daily.    . clotrimazole-betamethasone (LOTRISONE) cream Apply 1 application topically 2 (two) times daily. To feet    . Dextromethorphan-Guaifenesin (DIABETIC TUSSIN DM PO) Take by mouth at bedtime as needed.    Marland Kitchen dextromethorphan-guaiFENesin (MUCINEX DM) 30-600 MG 12hr tablet Take 1 tablet by mouth daily as needed for cough.    . docusate sodium (COLACE) 100 MG capsule Take 100 mg by mouth 2 (two) times daily as needed for mild constipation.    Marland Kitchen escitalopram (LEXAPRO) 10 MG tablet Take 1 tablet (10 mg total) by mouth daily. 30 tablet 5  .  fluticasone (FLONASE) 50 MCG/ACT nasal spray Place 2 sprays into both nostrils daily.    Marland Kitchen glimepiride (AMARYL) 1 MG tablet Take 1 mg by mouth daily with breakfast.    . glucose blood test strip 1 each by Other route as needed for other. Contour test strips Use as instructed    . HYDROcodone-acetaminophen (NORCO) 5-325 MG tablet Take 1 tablet by mouth 3 (three) times daily as needed for moderate pain. 90 tablet 0  . metFORMIN (GLUCOPHAGE) 500 MG tablet Take 1 tablet (500 mg total) by mouth 2 (two) times daily with a meal. 180 tablet 3  . MICROLET LANCETS MISC Check sugars daily 30 each 11  . Multiple Vitamin (MULTIVITAMIN) tablet Take 1 tablet by mouth daily. Centrum Silver 50+ women    .  Omega-3 Fatty Acids (FISH OIL) 1000 MG CPDR Take 1,000 mg by mouth daily.    Marland Kitchen omeprazole (PRILOSEC) 20 MG capsule Take 1 capsule (20 mg total) by mouth 2 (two) times daily before a meal. 60 capsule 11  . OXYGEN Inhale 2 L into the lungs continuous.    Marland Kitchen Propylene Glycol (SYSTANE BALANCE OP) Apply to eye 2 (two) times daily.    . pseudoephedrine ([employer]S NASAL DECONGESTANT) 30 MG tablet Take 30 mg by mouth 2 (two) times daily as needed for congestion.    . simvastatin (ZOCOR) 40 MG tablet Take 40 mg by mouth daily at 6 PM.    . sodium chloride (OCEAN) 0.65 % SOLN nasal spray Place 1 spray into both nostrils as needed for congestion.    Marland Kitchen tiotropium (SPIRIVA) 18 MCG inhalation capsule Place 18 mcg into inhaler and inhale daily.     No current facility-administered medications on file prior to visit.    Allergies  Allergen Reactions  . Vioxx [Rofecoxib] Swelling   Social History   Social History  . Marital status: Single    Spouse name: N/A  . Number of children: N/A  . Years of education: N/A   Occupational History  . Not on file.   Social History Main Topics  . Smoking status: Current Some Day Smoker    Types: Cigarettes  . Smokeless tobacco: Never Used  . Alcohol use No  . Drug use: No  . Sexual  activity: Not on file   Other Topics Concern  . Not on file   Social History Narrative  . No narrative on file   No family history on file.    Review of Systems  All other systems reviewed and are negative.      Objective:   Physical Exam  Constitutional: She is oriented to person, place, and time. She appears well-developed and well-nourished. No distress.  HENT:  Head: Normocephalic and atraumatic.  Right Ear: External ear normal.  Left Ear: External ear normal.  Nose: Nose normal.  Mouth/Throat: Oropharynx is clear and moist. No oropharyngeal exudate.  Eyes: Conjunctivae are normal. Right eye exhibits no discharge. Left eye exhibits no discharge. No scleral icterus.  Neck: Neck supple. No JVD present. No thyromegaly present.  Cardiovascular: Normal rate, regular rhythm, normal heart sounds and intact distal pulses.  Exam reveals no gallop and no friction rub.   No murmur heard. Pulmonary/Chest: Effort normal. She has wheezes.  Abdominal: Soft. Bowel sounds are normal. She exhibits no distension and no mass. There is no tenderness. There is no rebound and no guarding.  Musculoskeletal: She exhibits no edema.       Cervical back: She exhibits decreased range of motion and tenderness.       Lumbar back: She exhibits decreased range of motion and tenderness.  Lymphadenopathy:    She has no cervical adenopathy.  Neurological: She is alert and oriented to person, place, and time. She has normal reflexes. No cranial nerve deficit. She exhibits normal muscle tone. Coordination normal.  Skin: Skin is warm. No rash noted. She is not diaphoretic. No erythema. No pallor.  Psychiatric: She has a normal mood and affect. Her behavior is normal. Judgment and thought content normal.  Vitals reviewed.         Assessment & Plan:  Dyspnea on exertion  COPD mixed type ([place])  I reviewed the lab work that I obtained in August which was relatively normal. The patient has no anemia. I  am  very concerned that this is a cardiovascular or pulmonary problem. Certainly her severe COPD and deconditioning is contributing to her shortness of breath. However given her history of ASCVD, I believe the patient would benefit from an echocardiogram to evaluate for heart failure given her severe dyspnea on exertion. I will see if echocardiogram is abnormal, I would recommend cardiovascular consultation and also optimizing medications for heart failure. If echocardiogram is normal, the patient needs to focus on smoking cessation and I would recommend pulmonary rehabilitation. She also is complaining of severe neuropathic pain in both legs and is ready for epidural steroid injections in her lumbar spine. I recommended that we defer that until we workup heart first

## 2016-01-13 ENCOUNTER — Telehealth: Payer: Self-pay | Admitting: Family Medicine

## 2016-01-13 MED ORDER — HYDROCODONE-ACETAMINOPHEN 5-325 MG PO TABS: 1.0000 | ORAL_TABLET | Freq: Three times a day (TID) | ORAL | 0 refills | Status: DC | PRN

## 2016-01-13 NOTE — Telephone Encounter (Signed)
Ok to refill 

## 2016-01-13 NOTE — Telephone Encounter (Signed)
Patients daughter denise calling to say that if at possible could they could get rx for her hydrocodone today, she is going out of town and did not realize [facility] was out      [phone]

## 2016-01-13 NOTE — Telephone Encounter (Signed)
Prescription printed and patient made aware to come to office to pick up.  

## 2016-01-13 NOTE — Telephone Encounter (Signed)
ok 

## 2016-01-28 ENCOUNTER — Other Ambulatory Visit: Payer: Self-pay

## 2016-01-28 ENCOUNTER — Ambulatory Visit (HOSPITAL_COMMUNITY): Payer: Medicare Other | Attending: Cardiology

## 2016-01-28 DIAGNOSIS — Z720 Tobacco use: Secondary | ICD-10-CM | POA: Diagnosis not present

## 2016-01-28 DIAGNOSIS — R0609 Other forms of dyspnea: Secondary | ICD-10-CM | POA: Diagnosis not present

## 2016-01-28 DIAGNOSIS — J449 Chronic obstructive pulmonary disease, unspecified: Secondary | ICD-10-CM | POA: Diagnosis not present

## 2016-01-28 DIAGNOSIS — E785 Hyperlipidemia, unspecified: Secondary | ICD-10-CM | POA: Diagnosis not present

## 2016-01-28 DIAGNOSIS — R0600 Dyspnea, unspecified: Secondary | ICD-10-CM | POA: Diagnosis present

## 2016-01-28 DIAGNOSIS — I059 Rheumatic mitral valve disease, unspecified: Secondary | ICD-10-CM | POA: Diagnosis not present

## 2016-01-28 DIAGNOSIS — E119 Type 2 diabetes mellitus without complications: Secondary | ICD-10-CM | POA: Diagnosis not present

## 2016-01-30 ENCOUNTER — Other Ambulatory Visit: Payer: Self-pay | Admitting: Family Medicine

## 2016-01-30 DIAGNOSIS — R0600 Dyspnea, unspecified: Secondary | ICD-10-CM

## 2016-02-24 ENCOUNTER — Telehealth: Payer: Self-pay | Admitting: Family Medicine

## 2016-02-24 MED ORDER — HYDROCODONE-ACETAMINOPHEN 5-325 MG PO TABS: 1.0000 | ORAL_TABLET | Freq: Three times a day (TID) | ORAL | 0 refills | Status: DC | PRN

## 2016-02-24 NOTE — Telephone Encounter (Signed)
Approved. #[id number]. 

## 2016-02-24 NOTE — Telephone Encounter (Signed)
Ok to refill??      LRF - 01/13/2016

## 2016-02-24 NOTE — Telephone Encounter (Signed)
Patient calling to get rx for hydrocodone, she is out so if she can get today it would be great she said  [phone]

## 2016-02-24 NOTE — Telephone Encounter (Signed)
RX printed, left up front and patient aware to pick up  

## 2016-03-02 ENCOUNTER — Ambulatory Visit (INDEPENDENT_AMBULATORY_CARE_PROVIDER_SITE_OTHER): Payer: Medicare Other | Admitting: Family Medicine

## 2016-03-02 ENCOUNTER — Encounter: Payer: Self-pay | Admitting: Family Medicine

## 2016-03-02 VITALS — BP 140/82 | HR 104 | Temp 98.6°F | Resp 20 | Ht 64.0 in | Wt 137.0 lb

## 2016-03-02 DIAGNOSIS — Z23 Encounter for immunization: Secondary | ICD-10-CM

## 2016-03-02 DIAGNOSIS — R5383 Other fatigue: Secondary | ICD-10-CM | POA: Diagnosis not present

## 2016-03-02 LAB — BASIC METABOLIC PANEL WITH GFR
BUN: 21 mg/dL (ref 7–25)
CHLORIDE: 101 mmol/L (ref 98–110)
CO2: 34 mmol/L — AB (ref 20–31)
Calcium: 9.9 mg/dL (ref 8.6–10.4)
Creat: 0.52 mg/dL — ABNORMAL LOW (ref 0.60–0.93)
GFR, Est African American: >89 mL/min (ref >=60)
GFR, Est Non African American: >89 mL/min (ref >=60)
Glucose, Bld: 121 mg/dL — ABNORMAL HIGH (ref 70–99)
POTASSIUM: 4.7 mmol/L (ref 3.5–5.3)
SODIUM: 141 mmol/L (ref 135–146)

## 2016-03-02 NOTE — Progress Notes (Signed)
Subjective:    Patient ID: Briana Barr, female    DOB: 24-Sep-1940, 75 y.o.   MRN: [id number]  HPI  8/17 Patient is a 75 year old white female here today to establish care. She has oxygen dependent end-stage COPD. She is on 2 L via nasal cannula constantly. She continues to smoke. She is also on a combination of Pulmicort, Brovana, and Spiriva. She reports several exacerbations every year requiring antibiotics and steroids. She also has type 2 diabetes mellitus currently controlled with glimepiride. She reports frequent symptoms of hypoglycemia. Her fasting blood sugars are typically between 101 120. She also reports chronic low back pain. This seems to be her worst symptom. She reports a history of degenerative disc disease in lower back. She has a remote history of surgery to remove a "bone spur from pinching a nerve". She cannot recollect how long it's been since that surgery but it is been many years ago. She takes pain medication 3 times a day to help control the pain. She has not had any x-rays done of her lower back in many years. She denies any neuropathy in her legs or leg weakness. She also reports daily anxiety problems. She takes Xanax 3 times a day to control this. She has not tried and failed any preventative medication such as Prozac or Lexapro. Without the medication she has withdrawal type symptoms and uncontrolled anxiety. She also has a history of osteoporosis for which she takes Fosamax. Her colonoscopy is up-to-date. She is due for mammogram. She does not require Pap smears any longer because of her age.  At that time, my plan was: Patient has numerous medical issues that I'm concerned about. #1 concerned about her uses Xanax 3 times a day and her use of pain medication 3 times a day coupled with her oxygen dependent COPD and her osteoporosis all of which makes her very high fall risk. I explained this in detail to the patient. She states that she needs the Xanax and that she withdraws  and she does not get the medication 3 times a day. I recommended starting Lexapro 10 mg a day and rechecking in one month. Hopefully at that time we can start to wean the patient back off the Xanax as the Lexapro takes effect. I would also like to obtain some baseline lab work including a CBC, CMP, fasting lipid panel, hemoglobin A1c. Her sugars sound well controlled but I do believe that glimepiride is a poor choice given his risk of hypoglycemia. Based on her lab work I would like to switch her to metformin as long as her not any contraindications. I would also like to obtain x-rays of lumbar spine to evaluate her low back pain further rather than just treated with pain medication. She is a very high surgical risk and we very well may need to keep her on chronic pain medication to manage her pain but I want to rule out other possible causes first.  01/02/16 Patient reports worsening dyspnea on exertion over the last several months. Unfortunately she continues to smoke approximately one half a pack of cigarettes per day. She has severe COPD and is oxygen dependent. She is 98% on 2 L at rest. With ambulation the patient drops to 95% on 2 L however she becomes extremely winded and has to stop due to a combination of shortness of breath and also claudication symptoms in her legs. She has a history of peripheral vascular disease status post arterial bypass in her left leg certainly  raising the concern about possible coronary artery disease/heart disease. She denies any orthopnea. She denies any paroxysmal nocturnal dyspnea. There is no JVD today on examination. Her pulmonary exam shows diminished lung sounds bilaterally but no wheezes crackles Rales.  At that time, ,my plan was: I reviewed the lab work that I obtained in August which was relatively normal. The patient has no anemia. I am very concerned that this is a cardiovascular or pulmonary problem. Certainly her severe COPD and deconditioning is contributing to  her shortness of breath. However given her history of ASCVD, I believe the patient would benefit from an echocardiogram to evaluate for heart failure given her severe dyspnea on exertion. I will see if echocardiogram is abnormal, I would recommend cardiovascular consultation and also optimizing medications for heart failure. If echocardiogram is normal, the patient needs to focus on smoking cessation and I would recommend pulmonary rehabilitation. She also is complaining of severe neuropathic pain in both legs and is ready for epidural steroid injections in her lumbar spine. I recommended that we defer that until we workup heart first  03/02/16 Echo was essentially normal with ef 55-60% and grade 1 diastolic dysfunction.  She refused pulmonary rehab. We did increase the frequency of her hydrocodone for low back pain at her last office visit pending the results of her heart test. Since increasing the frequency, her low back pain is better. She no longer is interested in epidural steroid injections as the pain is more better managed. However she does complain of fatigue and feeling "sleepy" during the day. I believe that this is likely due to the increased frequency of pain medication. Is also possible that she has nocturnal hypoxia however she is adamant that she has been checked for sleep apnea within the last 2 years and was normal. She is on chronic oxygen due to her COPD. She is complaining of bilateral cerumen impactions. Exam confirms that. She is also requesting a flu shot  Past Medical History:  Diagnosis Date  . Anxiety   . Arthritis    chronic low back pain (remote surgery)  . COPD (chronic obstructive pulmonary disease) ([place])   . DDD (degenerative disc disease), cervical   . DDD (degenerative disc disease), lumbar   . Diabetes mellitus without complication ([place])   . Diabetic nephropathy ([place])   . Duodenal ulcer   . Glaucoma   . Hyperlipidemia   . Osteoporosis   . Retinopathy, background,  proliferative    Past Surgical History:  Procedure Laterality Date  . cervical vertebral fusion     c5-6, 6-7  . CHOLECYSTECTOMY    . FEMORAL-POPLITEAL BYPASS GRAFT      Current Outpatient Prescriptions on File Prior to Visit  Medication Sig Dispense Refill  . acetaZOLAMIDE (DIAMOX) 250 MG tablet Take 250 mg by mouth daily.    Marland Kitchen albuterol (PROAIR HFA) 108 (90 Base) MCG/ACT inhaler Inhale into the lungs every 6 (six) hours as needed for wheezing or shortness of breath.    Marland Kitchen alendronate (FOSAMAX) 70 MG tablet Take 1 tablet (70 mg total) by mouth once a week. Take with a full glass of water on an empty stomach. 4 tablet 11  . Aloe-Sodium Chloride (AYR SALINE NASAL GEL NA) Place into the nose 2 (two) times daily.    Marland Kitchen ALPRAZolam (XANAX) 0.5 MG tablet Take 0.5 mg by mouth 3 (three) times daily as needed for anxiety.    Marland Kitchen arformoterol (BROVANA) 15 MCG/2ML NEBU Take 15 mcg by nebulization 2 (two)  times daily.    Marland Kitchen aspirin 81 MG tablet Take 81 mg by mouth daily.    . budesonide (PULMICORT) 0.5 MG/2ML nebulizer solution Take 0.5 mg by nebulization 2 (two) times daily.    . Cholecalciferol (VITAMIN D3) 1000 units CAPS Take 1 capsule by mouth daily.    Marland Kitchen CINNAMON PO Take 1,000 mg by mouth daily.    . clotrimazole-betamethasone (LOTRISONE) cream Apply 1 application topically 2 (two) times daily. To feet    . Dextromethorphan-Guaifenesin (DIABETIC TUSSIN DM PO) Take by mouth at bedtime as needed.    Marland Kitchen dextromethorphan-guaiFENesin (MUCINEX DM) 30-600 MG 12hr tablet Take 1 tablet by mouth daily as needed for cough.    . docusate sodium (COLACE) 100 MG capsule Take 100 mg by mouth 2 (two) times daily as needed for mild constipation.    Marland Kitchen escitalopram (LEXAPRO) 10 MG tablet Take 1 tablet (10 mg total) by mouth daily. 30 tablet 5  . fluticasone (FLONASE) 50 MCG/ACT nasal spray Place 2 sprays into both nostrils daily.    Marland Kitchen glimepiride (AMARYL) 1 MG tablet Take 1 mg by mouth daily with breakfast.    .  glucose blood test strip 1 each by Other route as needed for other. Contour test strips Use as instructed    . HYDROcodone-acetaminophen (NORCO) 5-325 MG tablet Take 1 tablet by mouth 3 (three) times daily as needed for moderate pain. 90 tablet 0  . metFORMIN (GLUCOPHAGE) 500 MG tablet Take 1 tablet (500 mg total) by mouth 2 (two) times daily with a meal. 180 tablet 3  . MICROLET LANCETS MISC Check sugars daily 30 each 11  . Multiple Vitamin (MULTIVITAMIN) tablet Take 1 tablet by mouth daily. Centrum Silver 50+ women    . Omega-3 Fatty Acids (FISH OIL) 1000 MG CPDR Take 1,000 mg by mouth daily.    Marland Kitchen omeprazole (PRILOSEC) 20 MG capsule Take 1 capsule (20 mg total) by mouth 2 (two) times daily before a meal. 60 capsule 11  . OXYGEN Inhale 2 L into the lungs continuous.    Marland Kitchen Propylene Glycol (SYSTANE BALANCE OP) Apply to eye 2 (two) times daily.    . pseudoephedrine ([employer] NASAL DECONGESTANT) 30 MG tablet Take 30 mg by mouth 2 (two) times daily as needed for congestion.    . simvastatin (ZOCOR) 40 MG tablet Take 40 mg by mouth daily at 6 PM.    . sodium chloride (OCEAN) 0.65 % SOLN nasal spray Place 1 spray into both nostrils as needed for congestion.    Marland Kitchen tiotropium (SPIRIVA) 18 MCG inhalation capsule Place 18 mcg into inhaler and inhale daily.     No current facility-administered medications on file prior to visit.    Allergies  Allergen Reactions  . Vioxx [Rofecoxib] Swelling   Social History   Social History  . Marital status: Single    Spouse name: N/A  . Number of children: N/A  . Years of education: N/A   Occupational History  . Not on file.   Social History Main Topics  . Smoking status: Current Some Day Smoker    Types: Cigarettes  . Smokeless tobacco: Never Used  . Alcohol use No  . Drug use: No  . Sexual activity: Not on file   Other Topics Concern  . Not on file   Social History Narrative  . No narrative on file   No family history on file.    Review of Systems   All other systems reviewed and are negative.  Objective:   Physical Exam  Constitutional: She is oriented to person, place, and time. She appears well-developed and well-nourished. No distress.  HENT:  Head: Normocephalic and atraumatic.  Right Ear: External ear normal.  Left Ear: External ear normal.  Nose: Nose normal.  Mouth/Throat: Oropharynx is clear and moist. No oropharyngeal exudate.  Eyes: Conjunctivae are normal. Right eye exhibits no discharge. Left eye exhibits no discharge. No scleral icterus.  Neck: Neck supple. No JVD present. No thyromegaly present.  Cardiovascular: Normal rate, regular rhythm, normal heart sounds and intact distal pulses.  Exam reveals no gallop and no friction rub.   No murmur heard. Pulmonary/Chest: Effort normal. She has wheezes.  Abdominal: Soft. Bowel sounds are normal. She exhibits no distension and no mass. There is no tenderness. There is no rebound and no guarding.  Musculoskeletal: She exhibits no edema.       Cervical back: She exhibits decreased range of motion and tenderness.       Lumbar back: She exhibits decreased range of motion and tenderness.  Lymphadenopathy:    She has no cervical adenopathy.  Neurological: She is alert and oriented to person, place, and time. She has normal reflexes. No cranial nerve deficit. She exhibits normal muscle tone. Coordination normal.  Skin: Skin is warm. No rash noted. She is not diaphoretic. No erythema. No pallor.  Psychiatric: She has a normal mood and affect. Her behavior is normal. Judgment and thought content normal.  Vitals reviewed.         Assessment & Plan:  Other fatigue - Plan: BASIC METABOLIC PANEL WITH GFR  At this point I believe her dyspnea and her fatigue is due to a combination of severe COPD, hypoxia/chronic hypoxia, narcotic pain medication. I will check a BMP to rule out dehydration as she does have some [place]. I continue to encourage her to quit smoking. She can  try to decrease the frequency of pain medication as allowed to see if the fatigue would improve. I continue to recommend a nocturnal sleep study but she declines the present time. She will receive a flu shot today. Cerumen impactions were removed with irrigation bilaterally

## 2016-03-15 ENCOUNTER — Other Ambulatory Visit: Payer: Self-pay | Admitting: Family Medicine

## 2016-03-23 ENCOUNTER — Telehealth: Payer: Self-pay | Admitting: Family Medicine

## 2016-03-23 MED ORDER — HYDROCODONE-ACETAMINOPHEN 5-325 MG PO TABS: 1.0000 | ORAL_TABLET | Freq: Three times a day (TID) | ORAL | 0 refills | Status: DC | PRN

## 2016-03-23 NOTE — Telephone Encounter (Signed)
ok 

## 2016-03-23 NOTE — Telephone Encounter (Signed)
Patient needs rx for her hydrocodone  [phone] when ready

## 2016-03-23 NOTE — Telephone Encounter (Signed)
Ok to refill 

## 2016-03-23 NOTE — Telephone Encounter (Signed)
RX printed, left up front and patient's daughter aware to pick up in am

## 2016-03-24 ENCOUNTER — Other Ambulatory Visit: Payer: Self-pay | Admitting: Family Medicine

## 2016-05-01 ENCOUNTER — Telehealth: Payer: Self-pay | Admitting: Family Medicine

## 2016-05-01 MED ORDER — HYDROCODONE-ACETAMINOPHEN 5-325 MG PO TABS: 1.0000 | ORAL_TABLET | Freq: Three times a day (TID) | ORAL | 0 refills | Status: DC | PRN

## 2016-05-01 NOTE — Telephone Encounter (Signed)
Ok to refill 

## 2016-05-01 NOTE — Telephone Encounter (Signed)
cb [phone], pt needs refill on hydrocodone.

## 2016-05-01 NOTE — Telephone Encounter (Signed)
ok 

## 2016-05-01 NOTE — Telephone Encounter (Signed)
RX printed, left up front and patient's daughter aware to pick up  

## 2016-05-03 ENCOUNTER — Other Ambulatory Visit: Payer: Self-pay | Admitting: Family Medicine

## 2016-05-04 NOTE — Telephone Encounter (Signed)
Ok to refill 

## 2016-05-04 NOTE — Telephone Encounter (Signed)
ok 

## 2016-05-05 ENCOUNTER — Other Ambulatory Visit: Payer: Self-pay | Admitting: Family Medicine

## 2016-05-05 NOTE — Telephone Encounter (Signed)
Medication called to pharmacy. 

## 2016-05-07 ENCOUNTER — Other Ambulatory Visit: Payer: Self-pay | Admitting: Family Medicine

## 2016-05-07 DIAGNOSIS — F411 Generalized anxiety disorder: Secondary | ICD-10-CM

## 2016-06-03 ENCOUNTER — Telehealth: Payer: Self-pay | Admitting: Family Medicine

## 2016-06-03 NOTE — Telephone Encounter (Signed)
Patient is calling to get rx for her hydrocodone  [phone]

## 2016-06-03 NOTE — Telephone Encounter (Signed)
Ok to refill 

## 2016-06-04 MED ORDER — HYDROCODONE-ACETAMINOPHEN 5-325 MG PO TABS: 1.0000 | ORAL_TABLET | Freq: Three times a day (TID) | ORAL | 0 refills | Status: DC | PRN

## 2016-06-04 NOTE — Telephone Encounter (Signed)
ok 

## 2016-06-04 NOTE — Telephone Encounter (Signed)
RX printed, left up front and patient aware to pick up  

## 2016-06-28 ENCOUNTER — Other Ambulatory Visit: Payer: Self-pay | Admitting: Family Medicine

## 2016-06-29 NOTE — Telephone Encounter (Signed)
Medication refilled per protocol. 

## 2016-07-08 ENCOUNTER — Other Ambulatory Visit: Payer: Self-pay | Admitting: Family Medicine

## 2016-07-08 NOTE — Telephone Encounter (Signed)
Ok to refill 

## 2016-07-09 MED ORDER — HYDROCODONE-ACETAMINOPHEN 5-325 MG PO TABS: 1.0000 | ORAL_TABLET | Freq: Three times a day (TID) | ORAL | 0 refills | Status: DC | PRN

## 2016-07-09 NOTE — Telephone Encounter (Signed)
rx printed and family made aware will be ready

## 2016-07-09 NOTE — Telephone Encounter (Signed)
ok 

## 2016-08-03 ENCOUNTER — Other Ambulatory Visit: Payer: Self-pay | Admitting: Family Medicine

## 2016-08-03 NOTE — Telephone Encounter (Signed)
Medication refilled per protocol. 

## 2016-08-06 ENCOUNTER — Ambulatory Visit (INDEPENDENT_AMBULATORY_CARE_PROVIDER_SITE_OTHER): Payer: Medicare Other | Admitting: Family Medicine

## 2016-08-06 ENCOUNTER — Encounter: Payer: Self-pay | Admitting: Family Medicine

## 2016-08-06 VITALS — BP 122/70 | HR 100 | Temp 98.4°F | Resp 22 | Ht 64.0 in | Wt 126.0 lb

## 2016-08-06 DIAGNOSIS — J449 Chronic obstructive pulmonary disease, unspecified: Secondary | ICD-10-CM

## 2016-08-06 DIAGNOSIS — M545 Low back pain: Secondary | ICD-10-CM | POA: Diagnosis not present

## 2016-08-06 DIAGNOSIS — E1159 Type 2 diabetes mellitus with other circulatory complications: Secondary | ICD-10-CM | POA: Diagnosis not present

## 2016-08-06 DIAGNOSIS — E7800 Pure hypercholesterolemia, unspecified: Secondary | ICD-10-CM

## 2016-08-06 DIAGNOSIS — G8929 Other chronic pain: Secondary | ICD-10-CM | POA: Diagnosis not present

## 2016-08-06 DIAGNOSIS — R634 Abnormal weight loss: Secondary | ICD-10-CM

## 2016-08-06 MED ORDER — HYDROCODONE-ACETAMINOPHEN 5-325 MG PO TABS: 1.0000 | ORAL_TABLET | Freq: Three times a day (TID) | ORAL | 0 refills | Status: DC | PRN

## 2016-08-06 NOTE — Progress Notes (Signed)
Subjective:    Patient ID: Briana Barr, female    DOB: 24-Sep-1940, 76 y.o.   MRN: [id number]  HPI  8/17 Patient is a 76 year old white female here today to establish care. She has oxygen dependent end-stage COPD. She is on 2 L via nasal cannula constantly. She continues to smoke. She is also on a combination of Pulmicort, Brovana, and Spiriva. She reports several exacerbations every year requiring antibiotics and steroids. She also has type 2 diabetes mellitus currently controlled with glimepiride. She reports frequent symptoms of hypoglycemia. Her fasting blood sugars are typically between 101 120. She also reports chronic low back pain. This seems to be her worst symptom. She reports a history of degenerative disc disease in lower back. She has a remote history of surgery to remove a "bone spur from pinching a nerve". She cannot recollect how long it's been since that surgery but it is been many years ago. She takes pain medication 3 times a day to help control the pain. She has not had any x-rays done of her lower back in many years. She denies any neuropathy in her legs or leg weakness. She also reports daily anxiety problems. She takes Xanax 3 times a day to control this. She has not tried and failed any preventative medication such as Prozac or Lexapro. Without the medication she has withdrawal type symptoms and uncontrolled anxiety. She also has a history of osteoporosis for which she takes Fosamax. Her colonoscopy is up-to-date. She is due for mammogram. She does not require Pap smears any longer because of her age.  At that time, my plan was: Patient has numerous medical issues that I'm concerned about. #1 concerned about her uses Xanax 3 times a day and her use of pain medication 3 times a day coupled with her oxygen dependent COPD and her osteoporosis all of which makes her very high fall risk. I explained this in detail to the patient. She states that she needs the Xanax and that she withdraws  and she does not get the medication 3 times a day. I recommended starting Lexapro 10 mg a day and rechecking in one month. Hopefully at that time we can start to wean the patient back off the Xanax as the Lexapro takes effect. I would also like to obtain some baseline lab work including a CBC, CMP, fasting lipid panel, hemoglobin A1c. Her sugars sound well controlled but I do believe that glimepiride is a poor choice given his risk of hypoglycemia. Based on her lab work I would like to switch her to metformin as long as her not any contraindications. I would also like to obtain x-rays of lumbar spine to evaluate her low back pain further rather than just treated with pain medication. She is a very high surgical risk and we very well may need to keep her on chronic pain medication to manage her pain but I want to rule out other possible causes first.  01/02/16 Patient reports worsening dyspnea on exertion over the last several months. Unfortunately she continues to smoke approximately one half a pack of cigarettes per day. She has severe COPD and is oxygen dependent. She is 98% on 2 L at rest. With ambulation the patient drops to 95% on 2 L however she becomes extremely winded and has to stop due to a combination of shortness of breath and also claudication symptoms in her legs. She has a history of peripheral vascular disease status post arterial bypass in her left leg certainly  raising the concern about possible coronary artery disease/heart disease. She denies any orthopnea. She denies any paroxysmal nocturnal dyspnea. There is no JVD today on examination. Her pulmonary exam shows diminished lung sounds bilaterally but no wheezes crackles Rales.  At that time, ,my plan was: I reviewed the lab work that I obtained in August which was relatively normal. The patient has no anemia. I am very concerned that this is a cardiovascular or pulmonary problem. Certainly her severe COPD and deconditioning is contributing to  her shortness of breath. However given her history of ASCVD, I believe the patient would benefit from an echocardiogram to evaluate for heart failure given her severe dyspnea on exertion. I will see if echocardiogram is abnormal, I would recommend cardiovascular consultation and also optimizing medications for heart failure. If echocardiogram is normal, the patient needs to focus on smoking cessation and I would recommend pulmonary rehabilitation. She also is complaining of severe neuropathic pain in both legs and is ready for epidural steroid injections in her lumbar spine. I recommended that we defer that until we workup heart first  03/02/16 Echo was essentially normal with ef 55-60% and grade 1 diastolic dysfunction.  She refused pulmonary rehab. We did increase the frequency of her hydrocodone for low back pain at her last office visit pending the results of her heart test. Since increasing the frequency, her low back pain is better. She no longer is interested in epidural steroid injections as the pain is more better managed. However she does complain of fatigue and feeling "sleepy" during the day. I believe that this is likely due to the increased frequency of pain medication. Is also possible that she has nocturnal hypoxia however she is adamant that she has been checked for sleep apnea within the last 2 years and was normal. She is on chronic oxygen due to her COPD. She is complaining of bilateral cerumen impactions. Exam confirms that. She is also requesting a flu shot.  At that time, my plan was: At this point I believe her dyspnea and her fatigue is due to a combination of severe COPD, hypoxia/chronic hypoxia, narcotic pain medication. I will check a BMP to rule out dehydration as she does have some [place]. I continue to encourage her to quit smoking. She can try to decrease the frequency of pain medication as allowed to see if the fatigue would improve. I continue to recommend a nocturnal sleep  study but she declines the present time. She will receive a flu shot today. Cerumen impactions were removed with irrigation bilaterally  08/06/16 Patient is here today for a six-month checkup at my request. She has successfully stopped taking Xanax which I'm very proud of her for doing. She was using it sparingly for anxiety. Unfortunately since last time I saw the patient, she has lost 11 pounds without explanation. She continues to have chronic dyspnea on exertion and even at rest she is oxygen dependent. She also had an episode of epigastric discomfort this morning while eating cereal. She denies any melena or hematochezia. However today on examination she is tender to palpation in the right upper quadrant. There is no guarding or rebound. She denies any fevers or chills. Today was the first time she experienced this pain has since resolved. She denies any night sweats. She denies any nausea vomiting or diarrhea. She denies any rash. She continues to have her chronic low back pain. Regarding her sugars, she does not check it often but when she does her blood sugars  are typically under 130 she denies any hypoglycemia. She denies any polydipsia or blurry vision. She does report polyuria.  He states is possible that she could be missing some of her meals. She also reports poor appetite.  Past Medical History:  Diagnosis Date  . Anxiety   . Arthritis    chronic low back pain (remote surgery)  . COPD (chronic obstructive pulmonary disease) ([place])   . DDD (degenerative disc disease), cervical   . DDD (degenerative disc disease), lumbar   . Diabetes mellitus without complication ([place])   . Diabetic nephropathy ([place])   . Duodenal ulcer   . Glaucoma   . Hyperlipidemia   . Osteoporosis   . Retinopathy, background, proliferative    Past Surgical History:  Procedure Laterality Date  . cervical vertebral fusion     c5-6, 6-7  . CHOLECYSTECTOMY    . FEMORAL-POPLITEAL BYPASS GRAFT      Current  Outpatient Prescriptions on File Prior to Visit  Medication Sig Dispense Refill  . acetaZOLAMIDE (DIAMOX) 250 MG tablet TAKE 1 TABLET DAILY 30 tablet 1  . albuterol (PROAIR HFA) 108 (90 Base) MCG/ACT inhaler Inhale into the lungs every 6 (six) hours as needed for wheezing or shortness of breath.    Marland Kitchen alendronate (FOSAMAX) 70 MG tablet Take 1 tablet (70 mg total) by mouth once a week. Take with a full glass of water on an empty stomach. 4 tablet 11  . Aloe-Sodium Chloride (AYR SALINE NASAL GEL NA) Place into the nose 2 (two) times daily.    Marland Kitchen ALPRAZolam (XANAX) 0.5 MG tablet TAKE 1 TABLET BY MOUTH 3 TIMES A DAY AS NEEDED 90 tablet 1  . arformoterol (BROVANA) 15 MCG/2ML NEBU Take 15 mcg by nebulization 2 (two) times daily.    Marland Kitchen aspirin 81 MG tablet Take 81 mg by mouth daily.    . budesonide (PULMICORT) 0.5 MG/2ML nebulizer solution Take 0.5 mg by nebulization 2 (two) times daily.    . Cholecalciferol (VITAMIN D3) 1000 units CAPS Take 1 capsule by mouth daily.    Marland Kitchen CINNAMON PO Take 1,000 mg by mouth daily.    . clotrimazole-betamethasone (LOTRISONE) cream Apply 1 application topically 2 (two) times daily. To feet    . Dextromethorphan-Guaifenesin (DIABETIC TUSSIN DM PO) Take by mouth at bedtime as needed.    Marland Kitchen dextromethorphan-guaiFENesin (MUCINEX DM) 30-600 MG 12hr tablet Take 1 tablet by mouth daily as needed for cough.    . docusate sodium (COLACE) 100 MG capsule Take 100 mg by mouth 2 (two) times daily as needed for mild constipation.    Marland Kitchen escitalopram (LEXAPRO) 10 MG tablet TAKE 1 TABLET BY MOUTH EVERY DAY 30 tablet 4  . fluticasone (FLONASE) 50 MCG/ACT nasal spray Place 2 sprays into both nostrils daily.    Marland Kitchen glimepiride (AMARYL) 1 MG tablet Take 1 mg by mouth daily with breakfast.    . glucose blood test strip 1 each by Other route as needed for other. Contour test strips Use as instructed    . HYDROcodone-acetaminophen (NORCO) 5-325 MG tablet Take 1 tablet by mouth 3 (three) times daily as  needed for moderate pain. 90 tablet 0  . metFORMIN (GLUCOPHAGE) 500 MG tablet Take 1 tablet (500 mg total) by mouth 2 (two) times daily with a meal. 180 tablet 3  . MICROLET LANCETS MISC Check sugars daily 30 each 11  . Multiple Vitamin (MULTIVITAMIN) tablet Take 1 tablet by mouth daily. Centrum Silver 50+ women    . Omega-3 Fatty  Acids (FISH OIL) 1000 MG CPDR Take 1,000 mg by mouth daily.    Marland Kitchen omeprazole (PRILOSEC) 20 MG capsule Take 1 capsule (20 mg total) by mouth 2 (two) times daily before a meal. 60 capsule 11  . OXYGEN Inhale 2 L into the lungs continuous.    Marland Kitchen Propylene Glycol (SYSTANE BALANCE OP) Apply to eye 2 (two) times daily.    . pseudoephedrine ([employer] NASAL DECONGESTANT) 30 MG tablet Take 30 mg by mouth 2 (two) times daily as needed for congestion.    . simvastatin (ZOCOR) 40 MG tablet TAKE 1 TABLET DAILY 30 tablet 3  . sodium chloride (OCEAN) 0.65 % SOLN nasal spray Place 1 spray into both nostrils as needed for congestion.    Marland Kitchen tiotropium (SPIRIVA) 18 MCG inhalation capsule Place 18 mcg into inhaler and inhale daily.     No current facility-administered medications on file prior to visit.    Allergies  Allergen Reactions  . Vioxx [Rofecoxib] Swelling   Social History   Social History  . Marital status: Single    Spouse name: N/A  . Number of children: N/A  . Years of education: N/A   Occupational History  . Not on file.   Social History Main Topics  . Smoking status: Current Some Day Smoker    Types: Cigarettes  . Smokeless tobacco: Never Used  . Alcohol use No  . Drug use: No  . Sexual activity: Not on file   Other Topics Concern  . Not on file   Social History Narrative  . No narrative on file   No family history on file.    Review of Systems  All other systems reviewed and are negative.      Objective:   Physical Exam  Constitutional: She is oriented to person, place, and time. She appears well-developed and well-nourished. No distress.  HENT:    Head: Normocephalic and atraumatic.  Right Ear: External ear normal.  Left Ear: External ear normal.  Nose: Nose normal.  Mouth/Throat: Oropharynx is clear and moist. No oropharyngeal exudate.  Eyes: Conjunctivae are normal. Right eye exhibits no discharge. Left eye exhibits no discharge. No scleral icterus.  Neck: Neck supple. No JVD present. No thyromegaly present.  Cardiovascular: Normal rate, regular rhythm, normal heart sounds and intact distal pulses.  Exam reveals no gallop and no friction rub.   No murmur heard. Pulmonary/Chest: Effort normal. She has wheezes.  Abdominal: Soft. Bowel sounds are normal. She exhibits no distension and no mass. There is no tenderness. There is no rebound and no guarding.  Musculoskeletal: She exhibits no edema.       Cervical back: She exhibits decreased range of motion and tenderness.       Lumbar back: She exhibits decreased range of motion and tenderness.  Lymphadenopathy:    She has no cervical adenopathy.  Neurological: She is alert and oriented to person, place, and time. She has normal reflexes. No cranial nerve deficit. She exhibits normal muscle tone. Coordination normal.  Skin: Skin is warm. No rash noted. She is not diaphoretic. No erythema. No pallor.  Psychiatric: She has a normal mood and affect. Her behavior is normal. Judgment and thought content normal.  Vitals reviewed.         Assessment & Plan:  Controlled type 2 diabetes mellitus with other circulatory complication, without long-term current use of insulin ([place]) - Plan: CBC with Differential/Platelet, COMPLETE METABOLIC PANEL WITH GFR, Lipid panel, Hemoglobin A1c, TSH  Weight loss - Plan:  DG Chest 2 View  COPD mixed type ([place])  Pure hypercholesterolemia  Chronic bilateral low back pain without sciatica  I am very concerned by the patient's weight loss. I will like the patient to get a chest x-ray given her history of tobacco abuse. I would also like to get lab work  including a CBC, CMP, lipid panel, hemoglobin A1c, and a TSH. If lab work is normal and chest x-ray is normal, I would proceed with further workup of her abdominal discomfort that she experienced today particular given the tenderness to palpation in the right upper quadrant. Next step may be imaging of the abdomen including a right upper quadrant ultrasound. I will like to see her CMP results first before I make a definitive plan. Meanwhile will refill her pain medication that she takes for chronic low back pain. I'm happy that she discontinued Xanax. Check fasting lipid panel. Goal LDL cholesterol is less than 70 given her history of peripheral vascular disease

## 2016-08-07 ENCOUNTER — Telehealth: Payer: Self-pay | Admitting: Family Medicine

## 2016-08-07 ENCOUNTER — Ambulatory Visit
Admission: RE | Admit: 2016-08-07 | Discharge: 2016-08-07 | Disposition: A | Discharge status: Home / Self Care | Payer: Medicare Other | Source: Ambulatory Visit | Attending: Family Medicine | Admitting: Family Medicine

## 2016-08-07 ENCOUNTER — Other Ambulatory Visit: Payer: Medicare Other

## 2016-08-07 DIAGNOSIS — E1159 Type 2 diabetes mellitus with other circulatory complications: Secondary | ICD-10-CM | POA: Diagnosis not present

## 2016-08-07 DIAGNOSIS — R634 Abnormal weight loss: Secondary | ICD-10-CM

## 2016-08-07 DIAGNOSIS — J449 Chronic obstructive pulmonary disease, unspecified: Secondary | ICD-10-CM | POA: Diagnosis not present

## 2016-08-07 LAB — CBC WITH DIFFERENTIAL/PLATELET
Basophils Absolute: 0 cells/uL (ref 0–200)
Basophils Relative: 0 %
EOS PCT: 2 %
Eosinophils Absolute: 180 cells/uL (ref 15–500)
HCT: 50.3 % — ABNORMAL HIGH (ref 35.0–45.0)
HEMOGLOBIN: 15.7 g/dL — AB (ref 12.0–15.0)
LYMPHS ABS: 1980 {cells}/uL (ref 850–3900)
Lymphocytes Relative: 22 %
MCH: 28.8 pg (ref 27.0–33.0)
MCHC: 31.2 g/dL — ABNORMAL LOW (ref 32.0–36.0)
MCV: 92.1 fL (ref 80.0–100.0)
MPV: 10.6 fL (ref 7.5–12.5)
Monocytes Absolute: 900 cells/uL (ref 200–950)
Monocytes Relative: 10 %
NEUTROS ABS: 5940 {cells}/uL (ref 1500–7800)
Neutrophils Relative %: 66 %
Platelets: 468 10*3/uL — ABNORMAL HIGH (ref 140–400)
RBC: 5.46 MIL/uL — AB (ref 3.80–5.10)
RDW: 15.6 % — AB (ref 11.0–15.0)
WBC: 9 10*3/uL (ref 3.8–10.8)

## 2016-08-07 LAB — COMPLETE METABOLIC PANEL WITH GFR
AG Ratio: 1.6 Ratio (ref 1.0–2.5)
ALT: 24 U/L (ref 6–29)
AST: 26 U/L (ref 10–35)
Albumin: 4.2 g/dL (ref 3.6–5.1)
Alkaline Phosphatase: 62 U/L (ref 33–130)
BILIRUBIN TOTAL: 0.3 mg/dL (ref 0.2–1.2)
BUN/Creatinine Ratio: 34 Ratio — ABNORMAL HIGH (ref 6–22)
BUN: 18 mg/dL (ref 7–25)
CHLORIDE: 105 mmol/L (ref 98–110)
CO2: 29 mmol/L (ref 20–31)
Calcium: 9.4 mg/dL (ref 8.6–10.4)
Creat: 0.53 mg/dL — ABNORMAL LOW (ref 0.60–0.93)
GFR, Est Non African American: >89 mL/min (ref >=60)
GLOBULIN: 2.6 g/dL (ref 1.9–3.7)
GLUCOSE: 88 mg/dL (ref 70–99)
Potassium: 4.1 mmol/L (ref 3.5–5.3)
Sodium: 144 mmol/L (ref 135–146)
TOTAL PROTEIN: 6.8 g/dL (ref 6.1–8.1)

## 2016-08-07 LAB — LIPID PANEL
Cholesterol: 161 mg/dL (ref <200)
HDL: 53 mg/dL (ref >50)
LDL CALC: 88 mg/dL (ref <100)
Total CHOL/HDL Ratio: 3 Ratio (ref <5.0)
Triglycerides: 101 mg/dL (ref <150)
VLDL: 20 mg/dL (ref <30)

## 2016-08-07 MED ORDER — GLIMEPIRIDE 1 MG PO TABS: 1.0000 mg | ORAL_TABLET | Freq: Every day | ORAL | 5 refills | Status: DC

## 2016-08-07 MED ORDER — ACETAZOLAMIDE 250 MG PO TABS: 250.0000 mg | ORAL_TABLET | Freq: Every day | ORAL | 5 refills | Status: DC

## 2016-08-07 MED ORDER — GLUCOSE BLOOD VI STRP: 1.0000 | ORAL_STRIP | 5 refills | Status: DC | PRN

## 2016-08-07 NOTE — Telephone Encounter (Signed)
Pt needs ascensia contour or ascensia microfill test strips sent in to [facility] in [place].

## 2016-08-07 NOTE — Addendum Note (Signed)
Addended by: Shary Decamp B on: 08/07/2016 09:18 AM   Modules accepted: Orders

## 2016-08-08 LAB — TSH: TSH: 1.54 m[IU]/L

## 2016-08-08 LAB — HEMOGLOBIN A1C
HEMOGLOBIN A1C: 5.4 % (ref <5.7)
MEAN PLASMA GLUCOSE: 108 mg/dL

## 2016-08-10 ENCOUNTER — Encounter: Payer: Self-pay | Admitting: Family Medicine

## 2016-08-10 MED ORDER — GLUCOSE BLOOD VI STRP: 1.0000 | ORAL_STRIP | 5 refills | Status: DC | PRN

## 2016-08-10 NOTE — Telephone Encounter (Signed)
Medication called/sent to requested pharmacy  

## 2016-09-16 ENCOUNTER — Telehealth: Payer: Self-pay | Admitting: Family Medicine

## 2016-09-16 NOTE — Telephone Encounter (Signed)
Spoke to son and he has questions about pt's o2 - she was getting her o2 from her former MD in [place] and now that she is down here would like to get it locally. He is wanting to know if we think she needs to see pulmonology?    Does she need to come in for evaluation, send her to pulmonology or can we do it off her LOV?

## 2016-09-16 NOTE — Telephone Encounter (Addendum)
Patients son Legrand Como calling to speak to you regarding this patient and her oxygen  Please call him at [phone]

## 2016-09-17 NOTE — Telephone Encounter (Signed)
I am fine doing it off last ov.  Only needs to come in if we need to document O2 readings.

## 2016-09-17 NOTE — Telephone Encounter (Signed)
We need to have documentation of low o2 sats - son called and appt made. Will send to [facility] once completed.

## 2016-09-18 ENCOUNTER — Telehealth: Payer: Self-pay | Admitting: Family Medicine

## 2016-09-18 MED ORDER — HYDROCODONE-ACETAMINOPHEN 5-325 MG PO TABS: 1.0000 | ORAL_TABLET | Freq: Three times a day (TID) | ORAL | 0 refills | Status: DC | PRN

## 2016-09-18 NOTE — Telephone Encounter (Signed)
Pt needs refill on hydrocodone.  °

## 2016-09-18 NOTE — Telephone Encounter (Signed)
ok 

## 2016-09-18 NOTE — Telephone Encounter (Signed)
Ok to refill 

## 2016-09-21 NOTE — Telephone Encounter (Signed)
RX printed, left up front and patient aware to pick up  

## 2016-09-22 ENCOUNTER — Encounter: Payer: Self-pay | Admitting: Family Medicine

## 2016-09-22 ENCOUNTER — Ambulatory Visit (INDEPENDENT_AMBULATORY_CARE_PROVIDER_SITE_OTHER): Payer: Medicare Other | Admitting: Family Medicine

## 2016-09-22 VITALS — BP 130/80 | HR 110 | Temp 99.2°F | Resp 26 | Ht 64.0 in | Wt 123.0 lb

## 2016-09-22 DIAGNOSIS — R634 Abnormal weight loss: Secondary | ICD-10-CM | POA: Diagnosis not present

## 2016-09-22 DIAGNOSIS — R0609 Other forms of dyspnea: Secondary | ICD-10-CM

## 2016-09-22 DIAGNOSIS — J9611 Chronic respiratory failure with hypoxia: Secondary | ICD-10-CM

## 2016-09-22 DIAGNOSIS — R64 Cachexia: Secondary | ICD-10-CM | POA: Diagnosis not present

## 2016-09-22 DIAGNOSIS — J449 Chronic obstructive pulmonary disease, unspecified: Secondary | ICD-10-CM

## 2016-09-22 MED ORDER — MIRTAZAPINE 30 MG PO TABS: 30.0000 mg | ORAL_TABLET | Freq: Every day | ORAL | 3 refills | Status: DC

## 2016-09-22 NOTE — Progress Notes (Signed)
Subjective:    Patient ID: Briana Barr, female    DOB: 12/12/1940, 76 y.o.   MRN: [id number]  Medication Refill     8/17 Patient is a 76 year old white female here today to establish care. She has oxygen dependent end-stage COPD. She is on 2 L via nasal cannula constantly. She continues to smoke. She is also on a combination of Pulmicort, Brovana, and Spiriva. She reports several exacerbations every year requiring antibiotics and steroids. She also has type 2 diabetes mellitus currently controlled with glimepiride. She reports frequent symptoms of hypoglycemia. Her fasting blood sugars are typically between 101 120. She also reports chronic low back pain. This seems to be her worst symptom. She reports a history of degenerative disc disease in lower back. She has a remote history of surgery to remove a "bone spur from pinching a nerve". She cannot recollect how long it's been since that surgery but it is been many years ago. She takes pain medication 3 times a day to help control the pain. She has not had any x-rays done of her lower back in many years. She denies any neuropathy in her legs or leg weakness. She also reports daily anxiety problems. She takes Xanax 3 times a day to control this. She has not tried and failed any preventative medication such as Prozac or Lexapro. Without the medication she has withdrawal type symptoms and uncontrolled anxiety. She also has a history of osteoporosis for which she takes Fosamax. Her colonoscopy is up-to-date. She is due for mammogram. She does not require Pap smears any longer because of her age.  At that time, my plan was: Patient has numerous medical issues that I'm concerned about. #1 concerned about her uses Xanax 3 times a day and her use of pain medication 3 times a day coupled with her oxygen dependent COPD and her osteoporosis all of which makes her very high fall risk. I explained this in detail to the patient. She states that she needs the Xanax and  that she withdraws and she does not get the medication 3 times a day. I recommended starting Lexapro 10 mg a day and rechecking in one month. Hopefully at that time we can start to wean the patient back off the Xanax as the Lexapro takes effect. I would also like to obtain some baseline lab work including a CBC, CMP, fasting lipid panel, hemoglobin A1c. Her sugars sound well controlled but I do believe that glimepiride is a poor choice given his risk of hypoglycemia. Based on her lab work I would like to switch her to metformin as long as her not any contraindications. I would also like to obtain x-rays of lumbar spine to evaluate her low back pain further rather than just treated with pain medication. She is a very high surgical risk and we very well may need to keep her on chronic pain medication to manage her pain but I want to rule out other possible causes first.  01/02/16 Patient reports worsening dyspnea on exertion over the last several months. Unfortunately she continues to smoke approximately one half a pack of cigarettes per day. She has severe COPD and is oxygen dependent. She is 98% on 2 L at rest. With ambulation the patient drops to 95% on 2 L however she becomes extremely winded and has to stop due to a combination of shortness of breath and also claudication symptoms in her legs. She has a history of peripheral vascular disease status post arterial bypass in  her left leg certainly raising the concern about possible coronary artery disease/heart disease. She denies any orthopnea. She denies any paroxysmal nocturnal dyspnea. There is no JVD today on examination. Her pulmonary exam shows diminished lung sounds bilaterally but no wheezes crackles Rales.  At that time, ,my plan was: I reviewed the lab work that I obtained in August which was relatively normal. The patient has no anemia. I am very concerned that this is a cardiovascular or pulmonary problem. Certainly her severe COPD and deconditioning  is contributing to her shortness of breath. However given her history of ASCVD, I believe the patient would benefit from an echocardiogram to evaluate for heart failure given her severe dyspnea on exertion. I will see if echocardiogram is abnormal, I would recommend cardiovascular consultation and also optimizing medications for heart failure. If echocardiogram is normal, the patient needs to focus on smoking cessation and I would recommend pulmonary rehabilitation. She also is complaining of severe neuropathic pain in both legs and is ready for epidural steroid injections in her lumbar spine. I recommended that we defer that until we workup heart first  03/02/16 Echo was essentially normal with ef 55-60% and grade 1 diastolic dysfunction.  She refused pulmonary rehab. We did increase the frequency of her hydrocodone for low back pain at her last office visit pending the results of her heart test. Since increasing the frequency, her low back pain is better. She no longer is interested in epidural steroid injections as the pain is more better managed. However she does complain of fatigue and feeling "sleepy" during the day. I believe that this is likely due to the increased frequency of pain medication. Is also possible that she has nocturnal hypoxia however she is adamant that she has been checked for sleep apnea within the last 2 years and was normal. She is on chronic oxygen due to her COPD. She is complaining of bilateral cerumen impactions. Exam confirms that. She is also requesting a flu shot.  At that time, my plan was: At this point I believe her dyspnea and her fatigue is due to a combination of severe COPD, hypoxia/chronic hypoxia, narcotic pain medication. I will check a BMP to rule out dehydration as she does have some [place]. I continue to encourage her to quit smoking. She can try to decrease the frequency of pain medication as allowed to see if the fatigue would improve. I continue to  recommend a nocturnal sleep study but she declines the present time. She will receive a flu shot today. Cerumen impactions were removed with irrigation bilaterally  08/06/16 Patient is here today for a six-month checkup at my request. She has successfully stopped taking Xanax which I'm very proud of her for doing. She was using it sparingly for anxiety. Unfortunately since last time I saw the patient, she has lost 11 pounds without explanation. She continues to have chronic dyspnea on exertion and even at rest she is oxygen dependent. She also had an episode of epigastric discomfort this morning while eating cereal. She denies any melena or hematochezia. However today on examination she is tender to palpation in the right upper quadrant. There is no guarding or rebound. She denies any fevers or chills. Today was the first time she experienced this pain has since resolved. She denies any night sweats. She denies any nausea vomiting or diarrhea. She denies any rash. She continues to have her chronic low back pain. Regarding her sugars, she does not check it often but when she  does her blood sugars are typically under 130 she denies any hypoglycemia. She denies any polydipsia or blurry vision. She does report polyuria.  He states is possible that she could be missing some of her meals. She also reports poor appetite.  AT that time, my plan was: I am very concerned by the patient's weight loss. I will like the patient to get a chest x-ray given her history of tobacco abuse. I would also like to get lab work including a CBC, CMP, lipid panel, hemoglobin A1c, and a TSH. If lab work is normal and chest x-ray is normal, I would proceed with further workup of her abdominal discomfort that she experienced today particular given the tenderness to palpation in the right upper quadrant. Next step may be imaging of the abdomen including a right upper quadrant ultrasound. I will like to see her CMP results first before I make a  definitive plan. Meanwhile will refill her pain medication that she takes for chronic low back pain. I'm happy that she discontinued Xanax. Check fasting lipid panel. Goal LDL cholesterol is less than 70 given her history of peripheral vascular disease  09/22/16 The patient's labs on April 27 were normal. White blood cell count was normal. Hemoglobin was normal or slightly elevated. Hemoglobin A1c was excellent at 5.4. TSH was normal at 1.54. Cholesterol was excellent. CMP revealed no elevation in liver function test.  Chest x-ray revealed only emphysematous changes. There was no explanation for her weight loss. Given the lack of any specific symptoms, I made a decision to monitor the patient clinically. She is here today for recheck and for evaluation/certification for home oxygen. Wt Readings from Last 3 Encounters:  09/22/16 123 lb (55.8 kg)  08/06/16 126 lb (57.2 kg)  03/02/16 137 lb (62.1 kg)   Unfortunately she continues to lose weight and has lost 3 additional pounds since her last office visit. She is now down 14 pounds from November.  When I first met her in August she was 141 pounds. She is down 18 pounds from that.  Again today, the patient is asymptomatic. Aside from severe dyspnea on exertion, and her baseline dyspnea secondary to COPD and her chronic respiratory failure she denies any new symptoms. She denies depression. However her daughter-in-law states that she frequently skips breakfast due to excessive sleeping. She will simply watch TV all day long. She is doing nothing. She is isolated. She is a very small supper. And she goes to bed. The patient denies depression but states that she may not be eating sufficient calories. She does report trouble sleeping. Her primary reason for coming today however was 2 research 5 for home oxygen use. On 2 L of oxygen, she is 93% at rest. With ambulation she drops to 92%. Off oxygen, the patient oxygen saturation drops to 87% after ambulating 60 feet.  She becomes profoundly tachycardic at 120 bpm and is laboring to breathe. With oxygen she is 92% with ambulation.   Past Medical History:  Diagnosis Date  . Anxiety   . Arthritis    chronic low back pain (remote surgery)  . COPD (chronic obstructive pulmonary disease) ([place])   . DDD (degenerative disc disease), cervical   . DDD (degenerative disc disease), lumbar   . Diabetes mellitus without complication ([place])   . Diabetic nephropathy ([place])   . Duodenal ulcer   . Glaucoma   . Hyperlipidemia   . Osteoporosis   . Retinopathy, background, proliferative    Past Surgical History:  Procedure Laterality Date  . cervical vertebral fusion     c5-6, 6-7  . CHOLECYSTECTOMY    . FEMORAL-POPLITEAL BYPASS GRAFT      Current Outpatient Prescriptions on File Prior to Visit  Medication Sig Dispense Refill  . acetaZOLAMIDE (DIAMOX) 250 MG tablet Take 1 tablet (250 mg total) by mouth daily. 30 tablet 5  . albuterol (PROAIR HFA) 108 (90 Base) MCG/ACT inhaler Inhale into the lungs every 6 (six) hours as needed for wheezing or shortness of breath.    Marland Kitchen alendronate (FOSAMAX) 70 MG tablet Take 1 tablet (70 mg total) by mouth once a week. Take with a full glass of water on an empty stomach. 4 tablet 11  . Aloe-Sodium Chloride (AYR SALINE NASAL GEL NA) Place into the nose 2 (two) times daily.    Marland Kitchen ALPRAZolam (XANAX) 0.5 MG tablet TAKE 1 TABLET BY MOUTH 3 TIMES A DAY AS NEEDED 90 tablet 1  . arformoterol (BROVANA) 15 MCG/2ML NEBU Take 15 mcg by nebulization 2 (two) times daily.    Marland Kitchen aspirin 81 MG tablet Take 81 mg by mouth daily.    . budesonide (PULMICORT) 0.5 MG/2ML nebulizer solution Take 0.5 mg by nebulization 2 (two) times daily.    . Cholecalciferol (VITAMIN D3) 1000 units CAPS Take 1 capsule by mouth daily.    Marland Kitchen CINNAMON PO Take 1,000 mg by mouth daily.    . clotrimazole-betamethasone (LOTRISONE) cream Apply 1 application topically 2 (two) times daily. To feet    . Dextromethorphan-Guaifenesin  (DIABETIC TUSSIN DM PO) Take by mouth at bedtime as needed.    Marland Kitchen dextromethorphan-guaiFENesin (MUCINEX DM) 30-600 MG 12hr tablet Take 1 tablet by mouth daily as needed for cough.    . docusate sodium (COLACE) 100 MG capsule Take 100 mg by mouth 2 (two) times daily as needed for mild constipation.    Marland Kitchen escitalopram (LEXAPRO) 10 MG tablet TAKE 1 TABLET BY MOUTH EVERY DAY 30 tablet 4  . fluticasone (FLONASE) 50 MCG/ACT nasal spray Place 2 sprays into both nostrils daily.    Marland Kitchen glimepiride (AMARYL) 1 MG tablet Take 1 tablet (1 mg total) by mouth daily with breakfast. 30 tablet 5  . glucose blood test strip 1 each by Other route as needed for other. ascensia contour test strips strips-  Dx: E11.9 Check BS qam 50 each 5  . HYDROcodone-acetaminophen (NORCO) 5-325 MG tablet Take 1 tablet by mouth 3 (three) times daily as needed for moderate pain. 90 tablet 0  . metFORMIN (GLUCOPHAGE) 500 MG tablet Take 1 tablet (500 mg total) by mouth 2 (two) times daily with a meal. 180 tablet 3  . MICROLET LANCETS MISC Check sugars daily 30 each 11  . Multiple Vitamin (MULTIVITAMIN) tablet Take 1 tablet by mouth daily. Centrum Silver 50+ women    . Omega-3 Fatty Acids (FISH OIL) 1000 MG CPDR Take 1,000 mg by mouth daily.    Marland Kitchen omeprazole (PRILOSEC) 20 MG capsule Take 1 capsule (20 mg total) by mouth 2 (two) times daily before a meal. 60 capsule 11  . OXYGEN Inhale 2 L into the lungs continuous.    Marland Kitchen Propylene Glycol (SYSTANE BALANCE OP) Apply to eye 2 (two) times daily.    . pseudoephedrine ([employer] NASAL DECONGESTANT) 30 MG tablet Take 30 mg by mouth 2 (two) times daily as needed for congestion.    . simvastatin (ZOCOR) 40 MG tablet TAKE 1 TABLET DAILY 30 tablet 3  . sodium chloride (OCEAN) 0.65 % SOLN nasal spray  Place 1 spray into both nostrils as needed for congestion.    Marland Kitchen tiotropium (SPIRIVA) 18 MCG inhalation capsule Place 18 mcg into inhaler and inhale daily.     No current facility-administered medications on file  prior to visit.    Allergies  Allergen Reactions  . Vioxx [Rofecoxib] Swelling   Social History   Social History  . Marital status: Single    Spouse name: N/A  . Number of children: N/A  . Years of education: N/A   Occupational History  . Not on file.   Social History Main Topics  . Smoking status: Current Some Day Smoker    Types: Cigarettes  . Smokeless tobacco: Never Used  . Alcohol use No  . Drug use: No  . Sexual activity: Not on file   Other Topics Concern  . Not on file   Social History Narrative  . No narrative on file   No family history on file.    Review of Systems  All other systems reviewed and are negative.      Objective:   Physical Exam  Constitutional: She is oriented to person, place, and time. She appears well-developed and well-nourished. No distress.  HENT:  Head: Normocephalic and atraumatic.  Right Ear: External ear normal.  Left Ear: External ear normal.  Nose: Nose normal.  Mouth/Throat: Oropharynx is clear and moist. No oropharyngeal exudate.  Eyes: Conjunctivae are normal. Right eye exhibits no discharge. Left eye exhibits no discharge. No scleral icterus.  Neck: Neck supple. No JVD present. No thyromegaly present.  Cardiovascular: Normal rate, regular rhythm, normal heart sounds and intact distal pulses.  Exam reveals no gallop and no friction rub.   No murmur heard. Pulmonary/Chest: Effort normal. She has wheezes.  Abdominal: Soft. Bowel sounds are normal. She exhibits no distension and no mass. There is no tenderness. There is no rebound and no guarding.  Musculoskeletal: She exhibits no edema.       Cervical back: She exhibits decreased range of motion and tenderness.       Lumbar back: She exhibits decreased range of motion and tenderness.  Lymphadenopathy:    She has no cervical adenopathy.  Neurological: She is alert and oriented to person, place, and time. She has normal reflexes. No cranial nerve deficit. She exhibits  normal muscle tone. Coordination normal.  Skin: Skin is warm. No rash noted. She is not diaphoretic. No erythema. No pallor.  Psychiatric: She has a normal mood and affect. Her behavior is normal. Judgment and thought content normal.  Vitals reviewed.         Assessment & Plan:  Weight loss - Plan: mirtazapine (REMERON) 30 MG tablet  COPD mixed type (HCC)  Dyspnea on exertion  Chronic respiratory failure with hypoxia (HCC)  Given the weight loss, I believe we should proceed with a CT scan of the abdomen and pelvis to rule out an occult malignancy. She continues to decline with regards to her stamina and energy and weight. So far today lab work has been unrevealing. My suspicion is that she has weight loss secondary to poor appetite, poor by mouth intake, possible depression, and muscle wasting. Therefore I recommended Glucerna 2-3 cans a day. I want her to increase her caloric intake. I'll try to stimulate her appetite with Remeron 30 mg by mouth daily at bedtime.

## 2016-09-28 ENCOUNTER — Other Ambulatory Visit: Payer: Self-pay | Admitting: Family Medicine

## 2016-09-28 ENCOUNTER — Ambulatory Visit
Admission: RE | Admit: 2016-09-28 | Discharge: 2016-09-28 | Disposition: A | Discharge status: Home / Self Care | Payer: Medicare Other | Source: Ambulatory Visit | Attending: Family Medicine | Admitting: Family Medicine

## 2016-09-28 DIAGNOSIS — R64 Cachexia: Secondary | ICD-10-CM

## 2016-09-28 DIAGNOSIS — R634 Abnormal weight loss: Secondary | ICD-10-CM

## 2016-09-28 DIAGNOSIS — R630 Anorexia: Secondary | ICD-10-CM | POA: Diagnosis not present

## 2016-09-28 DIAGNOSIS — R0609 Other forms of dyspnea: Secondary | ICD-10-CM

## 2016-09-28 DIAGNOSIS — J449 Chronic obstructive pulmonary disease, unspecified: Secondary | ICD-10-CM

## 2016-09-28 MED ORDER — IOPAMIDOL (ISOVUE-300) INJECTION 61%
100.0000 mL | Freq: Once | INTRAVENOUS | Status: AC | PRN
  Administered 2016-09-28: 100 mL via INTRAVENOUS

## 2016-09-30 ENCOUNTER — Other Ambulatory Visit: Payer: Self-pay | Admitting: Family Medicine

## 2016-10-01 ENCOUNTER — Telehealth: Payer: Self-pay | Admitting: Family Medicine

## 2016-10-01 ENCOUNTER — Encounter: Payer: Self-pay | Admitting: Gastroenterology

## 2016-10-01 DIAGNOSIS — D120 Benign neoplasm of cecum: Secondary | ICD-10-CM

## 2016-10-01 DIAGNOSIS — N2889 Other specified disorders of kidney and ureter: Secondary | ICD-10-CM

## 2016-10-01 NOTE — Telephone Encounter (Signed)
Patient calling to let you know for her mri, she would like that to be an open mri because she is very claustrophobic

## 2016-10-01 NOTE — Telephone Encounter (Signed)
Put note in referral that pt needs open MRI due to claustrophobia

## 2016-10-03 ENCOUNTER — Other Ambulatory Visit: Payer: Self-pay | Admitting: Family Medicine

## 2016-10-03 DIAGNOSIS — F411 Generalized anxiety disorder: Secondary | ICD-10-CM

## 2016-10-22 ENCOUNTER — Telehealth: Payer: Self-pay | Admitting: Family Medicine

## 2016-10-22 MED ORDER — HYDROCODONE-ACETAMINOPHEN 5-325 MG PO TABS: 1.0000 | ORAL_TABLET | Freq: Three times a day (TID) | ORAL | 0 refills | Status: DC | PRN

## 2016-10-22 NOTE — Telephone Encounter (Signed)
ok 

## 2016-10-22 NOTE — Telephone Encounter (Signed)
Pt needs refill on hydrocodone.  °

## 2016-10-22 NOTE — Telephone Encounter (Signed)
RX printed, left up front and patient aware to pick up  

## 2016-10-22 NOTE — Telephone Encounter (Signed)
Ok to refill 

## 2016-10-26 ENCOUNTER — Ambulatory Visit
Admission: RE | Admit: 2016-10-26 | Discharge: 2016-10-26 | Disposition: A | Discharge status: Home / Self Care | Payer: Medicare Other | Source: Ambulatory Visit | Attending: Family Medicine | Admitting: Family Medicine

## 2016-10-26 DIAGNOSIS — N281 Cyst of kidney, acquired: Secondary | ICD-10-CM | POA: Diagnosis not present

## 2016-10-26 DIAGNOSIS — N2889 Other specified disorders of kidney and ureter: Secondary | ICD-10-CM

## 2016-10-26 MED ORDER — GADOBENATE DIMEGLUMINE 529 MG/ML IV SOLN
11.0000 mL | Freq: Once | INTRAVENOUS | Status: AC | PRN
  Administered 2016-10-26: 11 mL via INTRAVENOUS

## 2016-10-27 ENCOUNTER — Encounter: Payer: Self-pay | Admitting: Family Medicine

## 2016-11-12 ENCOUNTER — Ambulatory Visit (INDEPENDENT_AMBULATORY_CARE_PROVIDER_SITE_OTHER): Payer: Medicare Other | Admitting: Gastroenterology

## 2016-11-12 ENCOUNTER — Encounter: Payer: Self-pay | Admitting: Gastroenterology

## 2016-11-12 VITALS — BP 106/60 | HR 82 | Ht 64.0 in | Wt 125.0 lb

## 2016-11-12 DIAGNOSIS — R630 Anorexia: Secondary | ICD-10-CM

## 2016-11-12 DIAGNOSIS — R634 Abnormal weight loss: Secondary | ICD-10-CM

## 2016-11-12 DIAGNOSIS — R10816 Epigastric abdominal tenderness: Secondary | ICD-10-CM | POA: Diagnosis not present

## 2016-11-12 DIAGNOSIS — R933 Abnormal findings on diagnostic imaging of other parts of digestive tract: Secondary | ICD-10-CM

## 2016-11-12 MED ORDER — NA SULFATE-K SULFATE-MG SULF 17.5-3.13-1.6 GM/177ML PO SOLN: 1.0000 | Freq: Once | ORAL | 0 refills | Status: AC

## 2016-11-12 NOTE — Progress Notes (Addendum)
History of Present Illness: This is a 76 year old female referred by [name], MD for the evaluation of an abnormal CT scan of the colon, anorexia and weight loss. She is accompanied by her son. CT scan results below. Patient relates a decreased appetite and she has a weight loss of 18 pounds compared to August 2017. She relates prior colonoscopies performed in [place] with polyp removals. She reports that her last colonoscopy in September 2016 and prior to that in July 2011. Other than polyps she does not recall any other findings. Denies abdominal pain, constipation, diarrhea, change in stool caliber, melena, hematochezia, nausea, vomiting, dysphagia, reflux symptoms, chest pain.   Abd/pelvic CT 09/28/2016 IMPRESSION: Soft tissue thickening with mild luminal narrowing near the ileocecal valve in the proximal at ascending colon. Significance of this finding uncertain. This finding warrants direct visualization following colonic preparation if direct visualization of the colon has not been performed recently and proximal ascending colon was definitely visualized.  8 x 7 mm focus of decreased attenuation in the anterior mid right kidney that cannot be classified as a cyst. Further evaluation with pre and post contrast MRI should be considered. Pre and post contrast CT could alternatively be performed, but would likely be of decreased accuracy given lesion size.  Extensive atherosclerotic calcification in the aorta, iliac, and visualized femoral artery branches. There is also coronary artery calcification.  Gallbladder absent.  No hydronephrosis.  No renal or ureteral calculus.  Leiomyomatous uterus. Small left ovarian cyst. Given a cyst of this nature in this age group, a follow-up ultrasound of the pelvis in 1 year to confirm stability would be warranted per consensus guidelines.    Allergies  Allergen Reactions  . Vioxx [Rofecoxib] Swelling   Outpatient Medications  Prior to Visit  Medication Sig Dispense Refill  . acetaZOLAMIDE (DIAMOX) 250 MG tablet Take 1 tablet (250 mg total) by mouth daily. 30 tablet 5  . albuterol (PROAIR HFA) 108 (90 Base) MCG/ACT inhaler Inhale into the lungs every 6 (six) hours as needed for wheezing or shortness of breath.    Marland Kitchen alendronate (FOSAMAX) 70 MG tablet Take 1 tablet (70 mg total) by mouth once a week. Take with a full glass of water on an empty stomach. 4 tablet 11  . Aloe-Sodium Chloride (AYR SALINE NASAL GEL NA) Place into the nose 2 (two) times daily.    Marland Kitchen arformoterol (BROVANA) 15 MCG/2ML NEBU Take 15 mcg by nebulization 2 (two) times daily.    Marland Kitchen aspirin 81 MG tablet Take 81 mg by mouth daily.    . budesonide (PULMICORT) 0.5 MG/2ML nebulizer solution Take 0.5 mg by nebulization 2 (two) times daily.    . Cholecalciferol (VITAMIN D3) 1000 units CAPS Take 1 capsule by mouth daily.    Marland Kitchen CINNAMON PO Take 1,000 mg by mouth daily.    . clotrimazole-betamethasone (LOTRISONE) cream Apply 1 application topically 2 (two) times daily. To feet    . Dextromethorphan-Guaifenesin (DIABETIC TUSSIN DM PO) Take by mouth at bedtime as needed.    Marland Kitchen dextromethorphan-guaiFENesin (MUCINEX DM) 30-600 MG 12hr tablet Take 1 tablet by mouth daily as needed for cough.    . docusate sodium (COLACE) 100 MG capsule Take 100 mg by mouth 2 (two) times daily as needed for mild constipation.    Marland Kitchen escitalopram (LEXAPRO) 10 MG tablet TAKE 1 TABLET BY MOUTH EVERY DAY 90 tablet 3  . fluticasone (FLONASE) 50 MCG/ACT nasal spray Place 2 sprays into both nostrils daily.    Marland Kitchen  glimepiride (AMARYL) 1 MG tablet Take 1 tablet (1 mg total) by mouth daily with breakfast. 30 tablet 5  . glucose blood test strip 1 each by Other route as needed for other. ascensia contour test strips strips-  Dx: E11.9 Check BS qam 50 each 5  . HYDROcodone-acetaminophen (NORCO) 5-325 MG tablet Take 1 tablet by mouth 3 (three) times daily as needed for moderate pain. 90 tablet 0  .  metFORMIN (GLUCOPHAGE) 500 MG tablet Take 1 tablet (500 mg total) by mouth 2 (two) times daily with a meal. 180 tablet 3  . MICROLET LANCETS MISC Check sugars daily 30 each 11  . mirtazapine (REMERON) 30 MG tablet Take 1 tablet (30 mg total) by mouth at bedtime. 30 tablet 3  . Multiple Vitamin (MULTIVITAMIN) tablet Take 1 tablet by mouth daily. Centrum Silver 50+ women    . Omega-3 Fatty Acids (FISH OIL) 1000 MG CPDR Take 1,000 mg by mouth daily.    Marland Kitchen omeprazole (PRILOSEC) 20 MG capsule Take 1 capsule (20 mg total) by mouth 2 (two) times daily before a meal. 60 capsule 11  . OXYGEN Inhale 2 L into the lungs continuous.    Marland Kitchen Propylene Glycol (SYSTANE BALANCE OP) Apply to eye 2 (two) times daily.    . pseudoephedrine ([employer] NASAL DECONGESTANT) 30 MG tablet Take 30 mg by mouth 2 (two) times daily as needed for congestion.    . simvastatin (ZOCOR) 40 MG tablet TAKE 1 TABLET DAILY 30 tablet 3  . sodium chloride (OCEAN) 0.65 % SOLN nasal spray Place 1 spray into both nostrils as needed for congestion.    Marland Kitchen tiotropium (SPIRIVA) 18 MCG inhalation capsule Place 18 mcg into inhaler and inhale daily.    Marland Kitchen ALPRAZolam (XANAX) 0.5 MG tablet TAKE 1 TABLET BY MOUTH 3 TIMES A DAY AS NEEDED 90 tablet 1   No facility-administered medications prior to visit.    Past Medical History:  Diagnosis Date  . Anxiety   . Arthritis    chronic low back pain (remote surgery)  . Barrett esophagus   . Colon polyps    benign  . COPD (chronic obstructive pulmonary disease) ([facility])   . DDD (degenerative disc disease), cervical   . DDD (degenerative disc disease), lumbar   . Diabetes mellitus without complication ([facility])   . Diabetic nephropathy ([facility])   . Duodenal ulcer   . DVT (deep venous thrombosis) ([facility])    left leg x 2  . Gallstones   . Glaucoma   . Hyperlipidemia   . Osteoporosis   . Renal cyst, right   . Retinopathy, background, proliferative    Past Surgical History:  Procedure Laterality Date  . cervical  vertebral fusion     c5-6, 6-7  . CHOLECYSTECTOMY    . FEMORAL-POPLITEAL BYPASS GRAFT     Social History   Social History  . Marital status: Widowed    Spouse name: N/A  . Number of children: 3  . Years of education: N/A   Occupational History  . retired    Social History Main Topics  . Smoking status: Current Every Day Smoker    Types: Cigarettes  . Smokeless tobacco: Never Used     Comment: started age 86  . Alcohol use No  . Drug use: No  . Sexual activity: Not Asked   Other Topics Concern  . None   Social History Narrative  . None   Family History  Problem Relation Age of Onset  . Other Father  some sort of blood cancer-had to get transfusions  . Stomach cancer Maternal Grandmother   . Stomach cancer Maternal Grandfather        Review of Systems: Pertinent positive and negative review of systems were noted in the above HPI section. All other review of systems were otherwise negative.   Physical Exam: General: Well developed, well nourished, chronically ill appearing, O2 by [place], no acute distress Head: Normocephalic and atraumatic Eyes:  sclerae anicteric, EOMI Ears: Normal auditory acuity Mouth: No deformity or lesions Neck: Supple, no masses or thyromegaly Lungs: Clear throughout to auscultation, decreased breath sounds bilaterally Heart: Regular rate and rhythm; no murmurs, rubs or bruits Abdomen: Soft, epigastric tenderness and non distended. No masses, hepatosplenomegaly or hernias noted. Normal Bowel sounds Rectal: deferred to colonoscopy Musculoskeletal: Symmetrical with no gross deformities  Skin: No lesions on visible extremities Pulses:  Normal pulses noted Extremities: No clubbing, cyanosis, edema or deformities noted Neurological: Alert oriented x 4, grossly nonfocal Cervical Nodes:  No significant cervical adenopathy Inguinal Nodes: No significant inguinal adenopathy Psychological:  Alert and cooperative. Normal mood and  affect   Assessment and Recommendations:  1. Abnormal CT of the colon. Reported personal history of colon polyps. Rule out colorectal neoplasms and other disorders. Schedule colonoscopy. The risks (including bleeding, perforation, infection, missed lesions, medication reactions and possible hospitalization or surgery if complications occur), benefits, and alternatives to colonoscopy with possible biopsy and possible polypectomy were discussed with the patient and they consent to proceed.   2. Weight loss, anorexia, epigastric tenderness, GERD. Continue omeprazole 20 mg daily. Schedule EGD. The risks (including bleeding, perforation, infection, missed lesions, medication reactions and possible hospitalization or surgery if complications occur), benefits, and alternatives to endoscopy with possible biopsy and possible dilation were discussed with the patient and they consent to proceed.   3. COPD on home chronic home O2, 2L. Endoscopic procedures will be performed the hospital due to her respiratory status.   cc: [name], MD [address] [place], [place] [address]    11/22/2016 Addendum.  Records received, reviewed from [facility], MD in [place], [place]. Both colonoscopies performed for surveillance of colon polyps.   Colonoscopy 12/2014: sigmoid diverticulosis, internal hemorrhoids and a small sigmoid polyp (hyperplastic).  EGD 12/2014: mild antral gastritis, no pathological changes on biopsy of antrum. Colonoscopy 10/2009: sigmoid diverticulosis, small descending polyp (tubular adenoma) and small sigmoid/rectal polyps (focal glandular dropout suggestive of ischemic changes)

## 2016-11-12 NOTE — Patient Instructions (Signed)
You have been scheduled for an endoscopy and colonoscopy. Please follow the written instructions given to you at your visit today. Please pick up your prep supplies at the pharmacy within the next 1-3 days. If you use inhalers (even only as needed), please bring them with you on the day of your procedure. Your physician has requested that you go to www.[email or web address].com and enter the access code given to you at your visit today. This web site gives a general overview about your procedure. However, you should still follow specific instructions given to you by our office regarding your preparation for the procedure.  Normal BMI (Body Mass Index- based on height and weight) is between 23 and 30. Your BMI today is Body mass index is 21.46 kg/m. Marland Kitchen Please consider follow up  regarding your BMI with your Primary Care Provider.  Thank you for choosing me and [facility].  Pricilla Riffle. Dagoberto Ligas., MD., [facility]

## 2016-11-18 ENCOUNTER — Encounter (HOSPITAL_COMMUNITY): Payer: Self-pay | Admitting: *Deleted

## 2016-11-19 ENCOUNTER — Other Ambulatory Visit: Payer: Self-pay | Admitting: Family Medicine

## 2016-11-22 ENCOUNTER — Other Ambulatory Visit: Payer: Self-pay | Admitting: Family Medicine

## 2016-11-23 ENCOUNTER — Other Ambulatory Visit: Payer: Self-pay | Admitting: Family Medicine

## 2016-11-23 ENCOUNTER — Encounter (HOSPITAL_COMMUNITY): Payer: Self-pay | Admitting: *Deleted

## 2016-11-23 ENCOUNTER — Ambulatory Visit (HOSPITAL_COMMUNITY): Payer: Medicare Other | Admitting: Anesthesiology

## 2016-11-23 ENCOUNTER — Encounter (HOSPITAL_COMMUNITY)
Admission: RE | Disposition: A | Discharge status: Home / Self Care | Payer: Self-pay | Source: Ambulatory Visit | Attending: Gastroenterology

## 2016-11-23 ENCOUNTER — Ambulatory Visit (HOSPITAL_COMMUNITY)
Admission: RE | Admit: 2016-11-23 | Discharge: 2016-11-23 | Disposition: A | Discharge status: Home / Self Care | Payer: Medicare Other | Source: Ambulatory Visit | Attending: Gastroenterology | Admitting: Gastroenterology

## 2016-11-23 DIAGNOSIS — E785 Hyperlipidemia, unspecified: Secondary | ICD-10-CM | POA: Insufficient documentation

## 2016-11-23 DIAGNOSIS — K5730 Diverticulosis of large intestine without perforation or abscess without bleeding: Secondary | ICD-10-CM | POA: Diagnosis not present

## 2016-11-23 DIAGNOSIS — M5030 Other cervical disc degeneration, unspecified cervical region: Secondary | ICD-10-CM | POA: Diagnosis not present

## 2016-11-23 DIAGNOSIS — Z86718 Personal history of other venous thrombosis and embolism: Secondary | ICD-10-CM | POA: Insufficient documentation

## 2016-11-23 DIAGNOSIS — H409 Unspecified glaucoma: Secondary | ICD-10-CM | POA: Insufficient documentation

## 2016-11-23 DIAGNOSIS — J449 Chronic obstructive pulmonary disease, unspecified: Secondary | ICD-10-CM | POA: Diagnosis not present

## 2016-11-23 DIAGNOSIS — K640 First degree hemorrhoids: Secondary | ICD-10-CM | POA: Diagnosis not present

## 2016-11-23 DIAGNOSIS — M545 Low back pain: Secondary | ICD-10-CM | POA: Insufficient documentation

## 2016-11-23 DIAGNOSIS — F419 Anxiety disorder, unspecified: Secondary | ICD-10-CM | POA: Diagnosis not present

## 2016-11-23 DIAGNOSIS — I700 Atherosclerosis of aorta: Secondary | ICD-10-CM | POA: Diagnosis not present

## 2016-11-23 DIAGNOSIS — K219 Gastro-esophageal reflux disease without esophagitis: Secondary | ICD-10-CM | POA: Insufficient documentation

## 2016-11-23 DIAGNOSIS — Z7982 Long term (current) use of aspirin: Secondary | ICD-10-CM | POA: Diagnosis not present

## 2016-11-23 DIAGNOSIS — Z9049 Acquired absence of other specified parts of digestive tract: Secondary | ICD-10-CM | POA: Insufficient documentation

## 2016-11-23 DIAGNOSIS — D125 Benign neoplasm of sigmoid colon: Secondary | ICD-10-CM | POA: Insufficient documentation

## 2016-11-23 DIAGNOSIS — R10816 Epigastric abdominal tenderness: Secondary | ICD-10-CM | POA: Diagnosis not present

## 2016-11-23 DIAGNOSIS — K2970 Gastritis, unspecified, without bleeding: Secondary | ICD-10-CM | POA: Diagnosis not present

## 2016-11-23 DIAGNOSIS — Z86010 Personal history of colonic polyps: Secondary | ICD-10-CM | POA: Insufficient documentation

## 2016-11-23 DIAGNOSIS — M5136 Other intervertebral disc degeneration, lumbar region: Secondary | ICD-10-CM | POA: Insufficient documentation

## 2016-11-23 DIAGNOSIS — Z800 Family history of malignant neoplasm of digestive organs: Secondary | ICD-10-CM | POA: Insufficient documentation

## 2016-11-23 DIAGNOSIS — K621 Rectal polyp: Secondary | ICD-10-CM | POA: Diagnosis not present

## 2016-11-23 DIAGNOSIS — M810 Age-related osteoporosis without current pathological fracture: Secondary | ICD-10-CM | POA: Insufficient documentation

## 2016-11-23 DIAGNOSIS — Z79899 Other long term (current) drug therapy: Secondary | ICD-10-CM | POA: Insufficient documentation

## 2016-11-23 DIAGNOSIS — Z6821 Body mass index (BMI) 21.0-21.9, adult: Secondary | ICD-10-CM | POA: Insufficient documentation

## 2016-11-23 DIAGNOSIS — I2510 Atherosclerotic heart disease of native coronary artery without angina pectoris: Secondary | ICD-10-CM | POA: Diagnosis not present

## 2016-11-23 DIAGNOSIS — G8929 Other chronic pain: Secondary | ICD-10-CM | POA: Insufficient documentation

## 2016-11-23 DIAGNOSIS — Q2733 Arteriovenous malformation of digestive system vessel: Secondary | ICD-10-CM | POA: Diagnosis not present

## 2016-11-23 DIAGNOSIS — E113599 Type 2 diabetes mellitus with proliferative diabetic retinopathy without macular edema, unspecified eye: Secondary | ICD-10-CM | POA: Insufficient documentation

## 2016-11-23 DIAGNOSIS — Z981 Arthrodesis status: Secondary | ICD-10-CM | POA: Insufficient documentation

## 2016-11-23 DIAGNOSIS — F17210 Nicotine dependence, cigarettes, uncomplicated: Secondary | ICD-10-CM | POA: Insufficient documentation

## 2016-11-23 DIAGNOSIS — K449 Diaphragmatic hernia without obstruction or gangrene: Secondary | ICD-10-CM | POA: Insufficient documentation

## 2016-11-23 DIAGNOSIS — E1121 Type 2 diabetes mellitus with diabetic nephropathy: Secondary | ICD-10-CM | POA: Insufficient documentation

## 2016-11-23 DIAGNOSIS — R634 Abnormal weight loss: Secondary | ICD-10-CM

## 2016-11-23 DIAGNOSIS — K259 Gastric ulcer, unspecified as acute or chronic, without hemorrhage or perforation: Secondary | ICD-10-CM | POA: Insufficient documentation

## 2016-11-23 DIAGNOSIS — K649 Unspecified hemorrhoids: Secondary | ICD-10-CM | POA: Diagnosis not present

## 2016-11-23 DIAGNOSIS — Z7984 Long term (current) use of oral hypoglycemic drugs: Secondary | ICD-10-CM | POA: Insufficient documentation

## 2016-11-23 DIAGNOSIS — D259 Leiomyoma of uterus, unspecified: Secondary | ICD-10-CM | POA: Diagnosis not present

## 2016-11-23 DIAGNOSIS — R933 Abnormal findings on diagnostic imaging of other parts of digestive tract: Secondary | ICD-10-CM | POA: Diagnosis not present

## 2016-11-23 DIAGNOSIS — D128 Benign neoplasm of rectum: Secondary | ICD-10-CM

## 2016-11-23 DIAGNOSIS — M1990 Unspecified osteoarthritis, unspecified site: Secondary | ICD-10-CM | POA: Insufficient documentation

## 2016-11-23 DIAGNOSIS — N83202 Unspecified ovarian cyst, left side: Secondary | ICD-10-CM | POA: Insufficient documentation

## 2016-11-23 DIAGNOSIS — R630 Anorexia: Secondary | ICD-10-CM | POA: Diagnosis not present

## 2016-11-23 HISTORY — DX: Pneumonia, unspecified organism: J18.9

## 2016-11-23 HISTORY — DX: Gastro-esophageal reflux disease without esophagitis: K21.9

## 2016-11-23 HISTORY — PX: COLONOSCOPY WITH PROPOFOL: SHX5780

## 2016-11-23 HISTORY — PX: ESOPHAGOGASTRODUODENOSCOPY (EGD) WITH PROPOFOL: SHX5813

## 2016-11-23 LAB — GLUCOSE, CAPILLARY: GLUCOSE-CAPILLARY: 124 mg/dL — AB (ref 65–99)

## 2016-11-23 SURGERY — ESOPHAGOGASTRODUODENOSCOPY (EGD) WITH PROPOFOL
Anesthesia: Monitor Anesthesia Care

## 2016-11-23 MED ORDER — PROPOFOL 10 MG/ML IV BOLUS
INTRAVENOUS | Status: DC | PRN
  Administered 2016-11-23 (×2): 20 mg via INTRAVENOUS
  Administered 2016-11-23 (×2): 30 mg via INTRAVENOUS

## 2016-11-23 MED ORDER — PROPOFOL 10 MG/ML IV BOLUS
INTRAVENOUS | Status: AC
  Filled 2016-11-23: qty 60

## 2016-11-23 MED ORDER — PROPOFOL 500 MG/50ML IV EMUL
INTRAVENOUS | Status: DC | PRN
  Administered 2016-11-23: 100 ug/kg/min via INTRAVENOUS

## 2016-11-23 MED ORDER — LIDOCAINE 2% (20 MG/ML) 5 ML SYRINGE
INTRAMUSCULAR | Status: AC
  Filled 2016-11-23: qty 5

## 2016-11-23 MED ORDER — LIDOCAINE 2% (20 MG/ML) 5 ML SYRINGE
INTRAMUSCULAR | Status: DC | PRN
  Administered 2016-11-23: 80 mg via INTRAVENOUS

## 2016-11-23 MED ORDER — LACTATED RINGERS IV SOLN
INTRAVENOUS | Status: DC
  Administered 2016-11-23: 1000 mL via INTRAVENOUS

## 2016-11-23 SURGICAL SUPPLY — 24 items

## 2016-11-23 NOTE — Telephone Encounter (Signed)
Pt needs refill on hydrocodone.  °

## 2016-11-23 NOTE — H&P (View-Only) (Signed)
History of Present Illness: This is a 76 year old female referred by [name], MD for the evaluation of an abnormal CT scan of the colon, anorexia and weight loss. She is accompanied by her son. CT scan results below. Patient relates a decreased appetite and she has a weight loss of 18 pounds compared to August 2017. She relates prior colonoscopies performed in [place] with polyp removals. She reports that her last colonoscopy in September 2016 and prior to that in July 2011. Other than polyps she does not recall any other findings. Denies abdominal pain, constipation, diarrhea, change in stool caliber, melena, hematochezia, nausea, vomiting, dysphagia, reflux symptoms, chest pain.   Abd/pelvic CT 09/28/2016 IMPRESSION: Soft tissue thickening with mild luminal narrowing near the ileocecal valve in the proximal at ascending colon. Significance of this finding uncertain. This finding warrants direct visualization following colonic preparation if direct visualization of the colon has not been performed recently and proximal ascending colon was definitely visualized.  8 x 7 mm focus of decreased attenuation in the anterior mid right kidney that cannot be classified as a cyst. Further evaluation with pre and post contrast MRI should be considered. Pre and post contrast CT could alternatively be performed, but would likely be of decreased accuracy given lesion size.  Extensive atherosclerotic calcification in the aorta, iliac, and visualized femoral artery branches. There is also coronary artery calcification.  Gallbladder absent.  No hydronephrosis.  No renal or ureteral calculus.  Leiomyomatous uterus. Small left ovarian cyst. Given a cyst of this nature in this age group, a follow-up ultrasound of the pelvis in 1 year to confirm stability would be warranted per consensus guidelines.    Allergies  Allergen Reactions  . Vioxx [Rofecoxib] Swelling   Outpatient Medications  Prior to Visit  Medication Sig Dispense Refill  . acetaZOLAMIDE (DIAMOX) 250 MG tablet Take 1 tablet (250 mg total) by mouth daily. 30 tablet 5  . albuterol (PROAIR HFA) 108 (90 Base) MCG/ACT inhaler Inhale into the lungs every 6 (six) hours as needed for wheezing or shortness of breath.    Marland Kitchen alendronate (FOSAMAX) 70 MG tablet Take 1 tablet (70 mg total) by mouth once a week. Take with a full glass of water on an empty stomach. 4 tablet 11  . Aloe-Sodium Chloride (AYR SALINE NASAL GEL NA) Place into the nose 2 (two) times daily.    Marland Kitchen arformoterol (BROVANA) 15 MCG/2ML NEBU Take 15 mcg by nebulization 2 (two) times daily.    Marland Kitchen aspirin 81 MG tablet Take 81 mg by mouth daily.    . budesonide (PULMICORT) 0.5 MG/2ML nebulizer solution Take 0.5 mg by nebulization 2 (two) times daily.    . Cholecalciferol (VITAMIN D3) 1000 units CAPS Take 1 capsule by mouth daily.    Marland Kitchen CINNAMON PO Take 1,000 mg by mouth daily.    . clotrimazole-betamethasone (LOTRISONE) cream Apply 1 application topically 2 (two) times daily. To feet    . Dextromethorphan-Guaifenesin (DIABETIC TUSSIN DM PO) Take by mouth at bedtime as needed.    Marland Kitchen dextromethorphan-guaiFENesin (MUCINEX DM) 30-600 MG 12hr tablet Take 1 tablet by mouth daily as needed for cough.    . docusate sodium (COLACE) 100 MG capsule Take 100 mg by mouth 2 (two) times daily as needed for mild constipation.    Marland Kitchen escitalopram (LEXAPRO) 10 MG tablet TAKE 1 TABLET BY MOUTH EVERY DAY 90 tablet 3  . fluticasone (FLONASE) 50 MCG/ACT nasal spray Place 2 sprays into both nostrils daily.    Marland Kitchen  glimepiride (AMARYL) 1 MG tablet Take 1 tablet (1 mg total) by mouth daily with breakfast. 30 tablet 5  . glucose blood test strip 1 each by Other route as needed for other. ascensia contour test strips strips-  Dx: E11.9 Check BS qam 50 each 5  . HYDROcodone-acetaminophen (NORCO) 5-325 MG tablet Take 1 tablet by mouth 3 (three) times daily as needed for moderate pain. 90 tablet 0  .  metFORMIN (GLUCOPHAGE) 500 MG tablet Take 1 tablet (500 mg total) by mouth 2 (two) times daily with a meal. 180 tablet 3  . MICROLET LANCETS MISC Check sugars daily 30 each 11  . mirtazapine (REMERON) 30 MG tablet Take 1 tablet (30 mg total) by mouth at bedtime. 30 tablet 3  . Multiple Vitamin (MULTIVITAMIN) tablet Take 1 tablet by mouth daily. Centrum Silver 50+ women    . Omega-3 Fatty Acids (FISH OIL) 1000 MG CPDR Take 1,000 mg by mouth daily.    Marland Kitchen omeprazole (PRILOSEC) 20 MG capsule Take 1 capsule (20 mg total) by mouth 2 (two) times daily before a meal. 60 capsule 11  . OXYGEN Inhale 2 L into the lungs continuous.    Marland Kitchen Propylene Glycol (SYSTANE BALANCE OP) Apply to eye 2 (two) times daily.    . pseudoephedrine ([employer] NASAL DECONGESTANT) 30 MG tablet Take 30 mg by mouth 2 (two) times daily as needed for congestion.    . simvastatin (ZOCOR) 40 MG tablet TAKE 1 TABLET DAILY 30 tablet 3  . sodium chloride (OCEAN) 0.65 % SOLN nasal spray Place 1 spray into both nostrils as needed for congestion.    Marland Kitchen tiotropium (SPIRIVA) 18 MCG inhalation capsule Place 18 mcg into inhaler and inhale daily.    Marland Kitchen ALPRAZolam (XANAX) 0.5 MG tablet TAKE 1 TABLET BY MOUTH 3 TIMES A DAY AS NEEDED 90 tablet 1   No facility-administered medications prior to visit.    Past Medical History:  Diagnosis Date  . Anxiety   . Arthritis    chronic low back pain (remote surgery)  . Barrett esophagus   . Colon polyps    benign  . COPD (chronic obstructive pulmonary disease) ([facility])   . DDD (degenerative disc disease), cervical   . DDD (degenerative disc disease), lumbar   . Diabetes mellitus without complication ([facility])   . Diabetic nephropathy ([facility])   . Duodenal ulcer   . DVT (deep venous thrombosis) ([facility])    left leg x 2  . Gallstones   . Glaucoma   . Hyperlipidemia   . Osteoporosis   . Renal cyst, right   . Retinopathy, background, proliferative    Past Surgical History:  Procedure Laterality Date  . cervical  vertebral fusion     c5-6, 6-7  . CHOLECYSTECTOMY    . FEMORAL-POPLITEAL BYPASS GRAFT     Social History   Social History  . Marital status: Widowed    Spouse name: N/A  . Number of children: 3  . Years of education: N/A   Occupational History  . retired    Social History Main Topics  . Smoking status: Current Every Day Smoker    Types: Cigarettes  . Smokeless tobacco: Never Used     Comment: started age 13  . Alcohol use No  . Drug use: No  . Sexual activity: Not Asked   Other Topics Concern  . None   Social History Narrative  . None   Family History  Problem Relation Age of Onset  . Other Father  some sort of blood cancer-had to get transfusions  . Stomach cancer Maternal Grandmother   . Stomach cancer Maternal Grandfather        Review of Systems: Pertinent positive and negative review of systems were noted in the above HPI section. All other review of systems were otherwise negative.   Physical Exam: General: Well developed, well nourished, chronically ill appearing, O2 by [place], no acute distress Head: Normocephalic and atraumatic Eyes:  sclerae anicteric, EOMI Ears: Normal auditory acuity Mouth: No deformity or lesions Neck: Supple, no masses or thyromegaly Lungs: Clear throughout to auscultation, decreased breath sounds bilaterally Heart: Regular rate and rhythm; no murmurs, rubs or bruits Abdomen: Soft, epigastric tenderness and non distended. No masses, hepatosplenomegaly or hernias noted. Normal Bowel sounds Rectal: deferred to colonoscopy Musculoskeletal: Symmetrical with no gross deformities  Skin: No lesions on visible extremities Pulses:  Normal pulses noted Extremities: No clubbing, cyanosis, edema or deformities noted Neurological: Alert oriented x 4, grossly nonfocal Cervical Nodes:  No significant cervical adenopathy Inguinal Nodes: No significant inguinal adenopathy Psychological:  Alert and cooperative. Normal mood and  affect   Assessment and Recommendations:  1. Abnormal CT of the colon. Reported personal history of colon polyps. Rule out colorectal neoplasms and other disorders. Schedule colonoscopy. The risks (including bleeding, perforation, infection, missed lesions, medication reactions and possible hospitalization or surgery if complications occur), benefits, and alternatives to colonoscopy with possible biopsy and possible polypectomy were discussed with the patient and they consent to proceed.   2. Weight loss, anorexia, epigastric tenderness, GERD. Continue omeprazole 20 mg daily. Schedule EGD. The risks (including bleeding, perforation, infection, missed lesions, medication reactions and possible hospitalization or surgery if complications occur), benefits, and alternatives to endoscopy with possible biopsy and possible dilation were discussed with the patient and they consent to proceed.   3. COPD on home chronic home O2, 2L. Endoscopic procedures will be performed the hospital due to her respiratory status.   cc: [name], MD [address] [place], [place] [address]    11/22/2016 Addendum.  Records received, reviewed from [facility], MD in [place], [place]. Both colonoscopies performed for surveillance of colon polyps.   Colonoscopy 12/2014: sigmoid diverticulosis, internal hemorrhoids and a small sigmoid polyp (hyperplastic).  EGD 12/2014: mild antral gastritis, no pathological changes on biopsy of antrum. Colonoscopy 10/2009: sigmoid diverticulosis, small descending polyp (tubular adenoma) and small sigmoid/rectal polyps (focal glandular dropout suggestive of ischemic changes)

## 2016-11-23 NOTE — Transfer of Care (Signed)
Immediate Anesthesia Transfer of Care Note  Patient: Briana Barr  Procedure(s) Performed: Procedure(s): ESOPHAGOGASTRODUODENOSCOPY (EGD) WITH PROPOFOL (N/A) COLONOSCOPY WITH PROPOFOL (N/A)  Patient Location: PACU  Anesthesia Type:MAC  Level of Consciousness: Patient easily awoken, sedated, comfortable, cooperative, following commands, responds to stimulation.   Airway & Oxygen Therapy: Patient spontaneously breathing, ventilating well, oxygen via simple oxygen mask.  Post-op Assessment: Report given to PACU RN, vital signs reviewed and stable, moving all extremities.   Post vital signs: Reviewed and stable.  Complications: No apparent anesthesia complications  Last Vitals:  Vitals:   11/23/16 0704 11/23/16 0907  BP: (!) 148/83 125/65  Pulse: (!) 107 (!) 105  Resp: 18 (!) 29  Temp: 37.3 C 36.6 C  SpO2: 96% 94%    Last Pain:  Vitals:   11/23/16 0907  TempSrc: Oral         Complications: No apparent anesthesia complications

## 2016-11-23 NOTE — Discharge Instructions (Signed)
YOU HAD AN ENDOSCOPIC PROCEDURE TODAY: Refer to the procedure report and other information in the discharge instructions given to you for any specific questions about what was found during the examination. If this information does not answer your questions, please call [place] office at [phone] to clarify.   YOU SHOULD EXPECT: Some feelings of bloating in the abdomen. Passage of more gas than usual. Walking can help get rid of the air that was put into your GI tract during the procedure and reduce the bloating. If you had a lower endoscopy (such as a colonoscopy or flexible sigmoidoscopy) you may notice spotting of blood in your stool or on the toilet paper. Some abdominal soreness may be present for a day or two, also.  DIET: Your first meal following the procedure should be a light meal and then it is ok to progress to your normal diet. A half-sandwich or bowl of soup is an example of a good first meal. Heavy or fried foods are harder to digest and may make you feel nauseous or bloated. Drink plenty of fluids but you should avoid alcoholic beverages for 24 hours. If you had a esophageal dilation, please see attached instructions for diet.    ACTIVITY: Your care partner should take you home directly after the procedure. You should plan to take it easy, moving slowly for the rest of the day. You can resume normal activity the day after the procedure however YOU SHOULD NOT DRIVE, use power tools, machinery or perform tasks that involve climbing or major physical exertion for 24 hours (because of the sedation medicines used during the test).   SYMPTOMS TO REPORT IMMEDIATELY: A gastroenterologist can be reached at any hour. Please call [phone]  for any of the following symptoms:  Following lower endoscopy (colonoscopy, flexible sigmoidoscopy) Excessive amounts of blood in the stool  Significant tenderness, worsening of abdominal pains  Swelling of the abdomen that is new, acute  Fever of 100 or  higher  Following upper endoscopy (EGD, EUS, ERCP, esophageal dilation) Vomiting of blood or coffee ground material  New, significant abdominal pain  New, significant chest pain or pain under the shoulder blades  Painful or persistently difficult swallowing  New shortness of breath  Black, tarry-looking or red, bloody stools  FOLLOW UP:  If any biopsies were taken you will be contacted by phone or by letter within the next 1-3 weeks. Call [phone]  if you have not heard about the biopsies in 3 weeks.  Please also call with any specific questions about appointments or follow up tests.YOU HAD AN ENDOSCOPIC PROCEDURE TODAY: Refer to the procedure report and other information in the discharge instructions given to you for any specific questions about what was found during the examination. If this information does not answer your questions, please call [place] office at [phone] to clarify.   YOU SHOULD EXPECT: Some feelings of bloating in the abdomen. Passage of more gas than usual. Walking can help get rid of the air that was put into your GI tract during the procedure and reduce the bloating. If you had a lower endoscopy (such as a colonoscopy or flexible sigmoidoscopy) you may notice spotting of blood in your stool or on the toilet paper. Some abdominal soreness may be present for a day or two, also.  DIET: Your first meal following the procedure should be a light meal and then it is ok to progress to your normal diet. A half-sandwich or bowl of soup is an example of a   good first meal. Heavy or fried foods are harder to digest and may make you feel nauseous or bloated. Drink plenty of fluids but you should avoid alcoholic beverages for 24 hours. If you had a esophageal dilation, please see attached instructions for diet.    ACTIVITY: Your care partner should take you home directly after the procedure. You should plan to take it easy, moving slowly for the rest of the day. You can resume  normal activity the day after the procedure however YOU SHOULD NOT DRIVE, use power tools, machinery or perform tasks that involve climbing or major physical exertion for 24 hours (because of the sedation medicines used during the test).   SYMPTOMS TO REPORT IMMEDIATELY: A gastroenterologist can be reached at any hour. Please call [phone]  for any of the following symptoms:  Following lower endoscopy (colonoscopy, flexible sigmoidoscopy) Excessive amounts of blood in the stool  Significant tenderness, worsening of abdominal pains  Swelling of the abdomen that is new, acute  Fever of 100 or higher  Following upper endoscopy (EGD, EUS, ERCP, esophageal dilation) Vomiting of blood or coffee ground material  New, significant abdominal pain  New, significant chest pain or pain under the shoulder blades  Painful or persistently difficult swallowing  New shortness of breath  Black, tarry-looking or red, bloody stools  FOLLOW UP:  If any biopsies were taken you will be contacted by phone or by letter within the next 1-3 weeks. Call [phone]  if you have not heard about the biopsies in 3 weeks.  Please also call with any specific questions about appointments or follow up tests. 

## 2016-11-23 NOTE — Op Note (Addendum)
[facility] Patient Name: [facility] Procedure Date: 11/23/2016 MRN: [id number] Attending MD: Ladene Artist , MD Date of Birth: 1940-11-15 CSN: [id number] Age: 76 Admit Type: Outpatient Procedure:                Colonoscopy Indications:              Abnormal CT of the GI tract-abnormal ascending                            colon, personal history of adenomatous colon polyps. Providers:                Pricilla Riffle. Fuller Plan, MD, Tory Emerald, RN, Cherylynn Ridges, Technician, Heide Scales, CRNA Referring MD:             Cammie Mcgee. Dennard Schaumann, MD Medicines:                Monitored Anesthesia Care Complications:            No immediate complications. Estimated blood loss:                            None. Estimated Blood Loss:     Estimated blood loss: none. Procedure:                Pre-Anesthesia Assessment:                           - Prior to the procedure, a History and Physical                            was performed, and patient medications and                            allergies were reviewed. The patient's tolerance of                            previous anesthesia was also reviewed. The risks                            and benefits of the procedure and the sedation                            options and risks were discussed with the patient.                            All questions were answered, and informed consent                            was obtained. Prior Anticoagulants: The patient has                            taken no previous anticoagulant or antiplatelet  agents. ASA Grade Assessment: III - A patient with                            severe systemic disease. After reviewing the risks                            and benefits, the patient was deemed in                            satisfactory condition to undergo the procedure.                           After obtaining informed consent, the colonoscope                       was passed under direct vision. Throughout the                            procedure, the patient's blood pressure, pulse, and                            oxygen saturations were monitored continuously. The                            Olympus PCF-H190L [id number]) colonoscope was                            introduced through the anus and advanced to the the                            cecum, identified by appendiceal orifice and                            ileocecal valve. The ileocecal valve, appendiceal                            orifice, and rectum were photographed. The quality                            of the bowel preparation was good. The colonoscopy                            was performed without difficulty. The patient                            tolerated the procedure well. Scope In: 8:28:00 AM Scope Out: 8:50:46 AM Scope Withdrawal Time: 0 hours 16 minutes 28 seconds  Total Procedure Duration: 0 hours 22 minutes 46 seconds  Findings:      The perianal and digital rectal examinations were normal.      Four sessile polyps were found in the rectum (3) and sigmoid colon (1).       The polyps were 5 (rectum) to 7 mm (sigmoid) in size. These polyps were       removed with a cold snare. Resection and retrieval were  complete.      A few medium-mouthed diverticula were found in the sigmoid colon. There       was no evidence of diverticular bleeding.      Internal hemorrhoids were found during retroflexion. The hemorrhoids       were small and Grade I (internal hemorrhoids that do not prolapse).      The exam was otherwise without abnormality on direct and retroflexion       views. Specifically the ascending colon, hepatic flexure appeared normal.      Two small localized angiodysplastic lesions without bleeding were found       in the cecum. Impression:               - Four 5 to 7 mm polyps in the rectum and in the                            sigmoid colon, removed with a  cold snare. Resected                            and retrieved.                           - Mild diverticulosis in the sigmoid colon. There                            was no evidence of diverticular bleeding.                           - Two small AVMs in the cecum.                           - Internal hemorrhoids.                           - The examination was otherwise normal on direct                            and retroflexion views. Moderate Sedation:      N/A- Per Anesthesia Care Recommendation:           - Patient has a contact number available for                            emergencies. The signs and symptoms of potential                            delayed complications were discussed with the                            patient. Return to normal activities tomorrow.                            Written discharge instructions were provided to the                            patient.                           -  Resume previous diet.                           - Continue present medications.                           - Await pathology results.                           - No repeat surveillance colonoscopy planned due to                            age, comorbidities. Procedure Code(s):        --- Professional ---                           [id number], Colonoscopy, flexible; with removal of                            tumor(s), polyp(s), or other lesion(s) by snare                            technique Diagnosis Code(s):        --- Professional ---                           K62.1, Rectal polyp                           D12.5, Benign neoplasm of sigmoid colon                           K64.0, First degree hemorrhoids                           K57.30, Diverticulosis of large intestine without                            perforation or abscess without bleeding CPT copyright 2016 American Medical Association. All rights reserved. The codes documented in this report are preliminary and upon coder  review may  be revised to meet current compliance requirements. Ladene Artist, MD 11/23/2016 9:05:13 AM This report has been signed electronically. Number of Addenda: 0

## 2016-11-23 NOTE — Interval H&P Note (Signed)
History and Physical Interval Note:  11/23/2016 8:10 AM  Briana Barr  has presented today for surgery, with the diagnosis of weight loss, abnormal CT, anorexia  The various methods of treatment have been discussed with the patient and family. After consideration of risks, benefits and other options for treatment, the patient has consented to  Procedure(s): ESOPHAGOGASTRODUODENOSCOPY (EGD) WITH PROPOFOL (N/A) COLONOSCOPY WITH PROPOFOL (N/A) as a surgical intervention .  The patient's history has been reviewed, patient examined, no change in status, stable for surgery.  I have reviewed the patient's chart and labs.  Questions were answered to the patient's satisfaction.     Briana Barr. Fuller Plan

## 2016-11-23 NOTE — Anesthesia Procedure Notes (Signed)
Date/Time: 11/23/2016 8:18 AM Performed by: Deliah Boston Pre-anesthesia Checklist: Patient identified, Emergency Drugs available, Suction available and Patient being monitored Patient Re-evaluated:Patient Re-evaluated prior to induction Oxygen Delivery Method: Nasal cannula

## 2016-11-23 NOTE — Anesthesia Preprocedure Evaluation (Signed)
Anesthesia Evaluation  Patient identified by MRN, date of birth, ID band Patient awake    Reviewed: Allergy & Precautions, NPO status , Patient's Chart, lab work & pertinent test results  Airway Mallampati: I  TM Distance: >3 FB Neck ROM: Full    Dental   Pulmonary COPD, Current Smoker,    Pulmonary exam normal        Cardiovascular Normal cardiovascular exam     Neuro/Psych    GI/Hepatic GERD  Medicated and Controlled,  Endo/Other    Renal/GU      Musculoskeletal   Abdominal   Peds  Hematology   Anesthesia Other Findings   Reproductive/Obstetrics                             Anesthesia Physical Anesthesia Plan  ASA: III  Anesthesia Plan: MAC   Post-op Pain Management:    Induction: Intravenous  PONV Risk Score and Plan: 1 and Ondansetron and Dexamethasone  Airway Management Planned: Simple Face Mask  Additional Equipment:   Intra-op Plan:   Post-operative Plan:   Informed Consent: I have reviewed the patients History and Physical, chart, labs and discussed the procedure including the risks, benefits and alternatives for the proposed anesthesia with the patient or authorized representative who has indicated his/her understanding and acceptance.     Plan Discussed with: CRNA and Surgeon  Anesthesia Plan Comments:         Anesthesia Quick Evaluation

## 2016-11-23 NOTE — Anesthesia Postprocedure Evaluation (Signed)
Anesthesia Post Note  Patient: Briana Barr  Procedure(s) Performed: Procedure(s) (LRB): ESOPHAGOGASTRODUODENOSCOPY (EGD) WITH PROPOFOL (N/A) COLONOSCOPY WITH PROPOFOL (N/A)     Patient location during evaluation: PACU Anesthesia Type: MAC Level of consciousness: awake and alert Pain management: pain level controlled Vital Signs Assessment: post-procedure vital signs reviewed and stable Respiratory status: spontaneous breathing, nonlabored ventilation, respiratory function stable and patient connected to nasal cannula oxygen Cardiovascular status: stable and blood pressure returned to baseline Anesthetic complications: no    Last Vitals:  Vitals:   11/23/16 0920 11/23/16 0930  BP: (!) 123/98 (!) 127/55  Pulse: (!) 103 98  Resp: (!) 26 (!) 24  Temp:    SpO2: 93% 98%    Last Pain:  Vitals:   11/23/16 0907  TempSrc: Oral                 [name] DAVID

## 2016-11-23 NOTE — Op Note (Addendum)
[facility] Patient Name: [facility] Procedure Date: 11/23/2016 MRN: [id number] Attending MD: Ladene Artist , MD Date of Birth: 04/02/1941 CSN: [id number] Age: 76 Admit Type: Outpatient Procedure:                Upper GI endoscopy Indications:              Anorexia, Weight loss, Epigastric tenderness Providers:                Pricilla Riffle. Fuller Plan, MD, Elmer Ramp. Tilden Dome, RN, [employer], Technician, Heide Scales, CRNA Referring MD:             Cammie Mcgee. Dennard Schaumann, MD Medicines:                Monitored Anesthesia Care Complications:            No immediate complications. Estimated Blood Loss:     Estimated blood loss: none. Procedure:                Pre-Anesthesia Assessment:                           - Prior to the procedure, a History and Physical                            was performed, and patient medications and                            allergies were reviewed. The patient's tolerance of                            previous anesthesia was also reviewed. The risks                            and benefits of the procedure and the sedation                            options and risks were discussed with the patient.                            All questions were answered, and informed consent                            was obtained. Prior Anticoagulants: The patient has                            taken no previous anticoagulant or antiplatelet                            agents. ASA Grade Assessment: III - A patient with                            severe systemic disease. After reviewing the risks  and benefits, the patient was deemed in                            satisfactory condition to undergo the procedure.                           After obtaining informed consent, the endoscope was                            passed under direct vision. Throughout the                            procedure, the patient's blood  pressure, pulse, and                            oxygen saturations were monitored continuously. The                            Olympus GIF-H190 ([id number]) endoscope was introduced                            through the mouth, and advanced to the second part                            of duodenum. The upper GI endoscopy was                            accomplished without difficulty. The patient                            tolerated the procedure well. Scope In: Scope Out: Findings:      The examined esophagus was normal.      A few localized, small non-bleeding erosions were found in the       prepyloric region of the stomach. There were no stigmata of recent       bleeding.      A small hiatal hernia was present.      The exam of the stomach was otherwise normal.      The duodenal bulb and second portion of the duodenum were normal. Impression:               - Normal esophagus.                           - Non-bleeding erosive gastropathy.                           - Small hiatal hernia.                           - Normal duodenal bulb and second portion of the                            duodenum.                           -  No specimens collected. Moderate Sedation:      N/A- Per Anesthesia Care Recommendation:           - Patient has a contact number available for                            emergencies. The signs and symptoms of potential                            delayed complications were discussed with the                            patient. Return to normal activities tomorrow.                            Written discharge instructions were provided to the                            patient.                           - Resume previous diet.                           - Continue present medications.                           - Follow up with PCP for ongoing care. Procedure Code(s):        --- Professional ---                           [phone], Esophagogastroduodenoscopy, flexible,                             transoral; diagnostic, including collection of                            specimen(s) by brushing or washing, when performed                            (separate procedure) Diagnosis Code(s):        --- Professional ---                           K31.89, Other diseases of stomach and duodenum                           K44.9, Diaphragmatic hernia without obstruction or                            gangrene                           R63.0, Anorexia                           R63.4, Abnormal weight loss CPT copyright 2016 American Medical Association. All rights reserved. The codes  documented in this report are preliminary and upon coder review may  be revised to meet current compliance requirements. Ladene Artist, MD 11/23/2016 9:13:49 AM This report has been signed electronically. Number of Addenda: 0

## 2016-11-24 MED ORDER — HYDROCODONE-ACETAMINOPHEN 5-325 MG PO TABS: 1.0000 | ORAL_TABLET | Freq: Three times a day (TID) | ORAL | 0 refills | Status: DC | PRN

## 2016-11-24 NOTE — Telephone Encounter (Signed)
RX printed 

## 2016-11-24 NOTE — Telephone Encounter (Signed)
ok 

## 2016-11-24 NOTE — Telephone Encounter (Signed)
LRF 10/23/16 # 90  LOV 09/22/16  OK refill?

## 2016-11-25 ENCOUNTER — Encounter: Payer: Self-pay | Admitting: Gastroenterology

## 2016-11-30 ENCOUNTER — Telehealth: Payer: Self-pay | Admitting: Family Medicine

## 2016-11-30 MED ORDER — HYDROCODONE-ACETAMINOPHEN 5-325 MG PO TABS: 1.0000 | ORAL_TABLET | Freq: Three times a day (TID) | ORAL | 0 refills | Status: DC | PRN

## 2016-11-30 NOTE — Telephone Encounter (Signed)
New Message  Pt voiced she was waiting for the nurse to call her back about hydrocodeone 5-325 mg tablet every 6 hours for pain as needed  Please f/u

## 2016-11-30 NOTE — Telephone Encounter (Signed)
RX printed, left up front and patient aware to pick up  

## 2016-11-30 NOTE — Telephone Encounter (Signed)
ok 

## 2016-11-30 NOTE — Telephone Encounter (Signed)
Ok to refill 

## 2016-12-10 ENCOUNTER — Other Ambulatory Visit: Payer: Self-pay | Admitting: Family Medicine

## 2016-12-11 ENCOUNTER — Telehealth: Payer: Self-pay | Admitting: *Deleted

## 2016-12-11 NOTE — Telephone Encounter (Signed)
Pharmacy had sent her med refill request to previous MD and it was filled by them. Dtr wanted to make sure she was still suppose to be taking it and as per her chart yes she should still be taking. Pt does have the medication now.

## 2016-12-11 NOTE — Telephone Encounter (Signed)
Patient daughter in law Vassie Moment has some questions about the patient medications. Its the metformin and the glimepiride. Patient daughter in law is requesting a call back. Her contact is [phone]. Please advise. Thank you

## 2016-12-13 ENCOUNTER — Other Ambulatory Visit: Payer: Self-pay | Admitting: Family Medicine

## 2016-12-28 ENCOUNTER — Telehealth: Payer: Self-pay

## 2016-12-28 DIAGNOSIS — J449 Chronic obstructive pulmonary disease, unspecified: Secondary | ICD-10-CM

## 2016-12-28 NOTE — Telephone Encounter (Signed)
Son called requesting a new Rx order for a portable concentrator to use for patient instead of the tanks to be sent to Briana Barr. Patient's son states her tanks run out in several hours and she cannot go on any long distance trips without the use of several tanks.  Last OV was 09/22/2016 Next OV: none Please advise

## 2016-12-28 NOTE — Telephone Encounter (Signed)
Ok to call and place order with [facility] for l=portable oxygen concentrator.

## 2016-12-29 NOTE — Addendum Note (Signed)
Addended by: Sheral Flow on: 12/29/2016 04:42 PM   Modules accepted: Orders

## 2016-12-30 NOTE — Telephone Encounter (Signed)
Orders placed and [facility] made aware.

## 2017-01-13 ENCOUNTER — Other Ambulatory Visit: Payer: Self-pay

## 2017-01-13 NOTE — Telephone Encounter (Signed)
Last OV 09/22/2016 Last refill 8/20 Ok to refill?

## 2017-01-14 ENCOUNTER — Telehealth: Payer: Self-pay | Admitting: Family Medicine

## 2017-01-14 MED ORDER — HYDROCODONE-ACETAMINOPHEN 5-325 MG PO TABS: 1.0000 | ORAL_TABLET | Freq: Three times a day (TID) | ORAL | 0 refills | Status: DC | PRN

## 2017-01-14 NOTE — Telephone Encounter (Signed)
rx can be picked up after 2pm today. Langley Gauss is aware

## 2017-01-14 NOTE — Telephone Encounter (Signed)
Please call patients daughter in law regarding this patients spriva and if she can have  A refill of this or not, she says she is not taking it right.  [phone]

## 2017-01-14 NOTE — Telephone Encounter (Signed)
ok 

## 2017-01-15 MED ORDER — TIOTROPIUM BROMIDE MONOHYDRATE 18 MCG IN CAPS: 18.0000 ug | ORAL_CAPSULE | Freq: Every day | RESPIRATORY_TRACT | 3 refills | Status: DC

## 2017-01-15 NOTE — Telephone Encounter (Signed)
Refill sent to pharmacy.   

## 2017-02-10 ENCOUNTER — Other Ambulatory Visit: Payer: Self-pay | Admitting: Family Medicine

## 2017-02-10 MED ORDER — ACETAZOLAMIDE 250 MG PO TABS: 250.0000 mg | ORAL_TABLET | Freq: Every day | ORAL | 1 refills | Status: DC

## 2017-02-24 ENCOUNTER — Other Ambulatory Visit: Payer: Self-pay | Admitting: Family Medicine

## 2017-02-24 MED ORDER — SIMVASTATIN 40 MG PO TABS: 40.0000 mg | ORAL_TABLET | Freq: Every day | ORAL | 1 refills | Status: DC

## 2017-03-01 ENCOUNTER — Telehealth: Payer: Self-pay | Admitting: *Deleted

## 2017-03-01 DIAGNOSIS — Z23 Encounter for immunization: Secondary | ICD-10-CM | POA: Diagnosis not present

## 2017-03-01 NOTE — Telephone Encounter (Signed)
ok 

## 2017-03-01 NOTE — Telephone Encounter (Signed)
Received call from patient daughter in law [place].   Requested refill on Hydrocodone.   Ok to refill??  Last office visit 09/22/2016.  Last refill 01/14/2017.

## 2017-03-02 MED ORDER — HYDROCODONE-ACETAMINOPHEN 5-325 MG PO TABS: 1.0000 | ORAL_TABLET | Freq: Three times a day (TID) | ORAL | 0 refills | Status: DC | PRN

## 2017-03-02 NOTE — Telephone Encounter (Signed)
Prescription printed and patient daughter in law Langley Gauss made aware to come to office to pick up after 2pm on 03/02/2017.

## 2017-05-12 ENCOUNTER — Other Ambulatory Visit: Payer: Self-pay | Admitting: Family Medicine

## 2017-07-26 ENCOUNTER — Other Ambulatory Visit: Payer: Self-pay | Admitting: Family Medicine

## 2017-09-17 ENCOUNTER — Telehealth: Payer: Self-pay | Admitting: Family Medicine

## 2017-09-17 NOTE — Telephone Encounter (Signed)
Patients son came in to leave you a message regarding his mom, and getting an oxygen order, so she can get a portable oxygen concentrator. Has been trying to get this for one year through [facility]. He is very frustrated with the was this has been handled through them. He would like the order sent to josh at [facility]. Josh's phone number is [phone]

## 2017-09-20 NOTE — Telephone Encounter (Signed)
Paperwork is on in Dr. Samella Parr folder waiting to be filled out

## 2017-09-21 ENCOUNTER — Encounter: Payer: Self-pay | Admitting: Family Medicine

## 2017-09-21 NOTE — Telephone Encounter (Signed)
Called and spoke to [place] and he did receive the paperwork that was faxed earlier today.

## 2017-09-21 NOTE — Telephone Encounter (Signed)
Paperwork filled out and faxed to [facility]

## 2017-09-24 ENCOUNTER — Other Ambulatory Visit: Payer: Self-pay | Admitting: Family Medicine

## 2017-09-24 DIAGNOSIS — F411 Generalized anxiety disorder: Secondary | ICD-10-CM

## 2017-11-08 ENCOUNTER — Other Ambulatory Visit: Payer: Self-pay | Admitting: Family Medicine

## 2017-11-29 ENCOUNTER — Other Ambulatory Visit: Payer: Self-pay | Admitting: Family Medicine

## 2017-12-07 ENCOUNTER — Other Ambulatory Visit: Payer: Self-pay | Admitting: Family Medicine

## 2017-12-07 ENCOUNTER — Encounter: Payer: Self-pay | Admitting: Family Medicine

## 2017-12-07 ENCOUNTER — Ambulatory Visit (INDEPENDENT_AMBULATORY_CARE_PROVIDER_SITE_OTHER): Payer: Medicare Other | Admitting: Family Medicine

## 2017-12-07 VITALS — BP 140/82 | HR 99 | Temp 99.1°F | Resp 24 | Ht 64.0 in | Wt 130.0 lb

## 2017-12-07 DIAGNOSIS — L608 Other nail disorders: Secondary | ICD-10-CM

## 2017-12-07 DIAGNOSIS — Z0000 Encounter for general adult medical examination without abnormal findings: Secondary | ICD-10-CM | POA: Diagnosis not present

## 2017-12-07 DIAGNOSIS — E118 Type 2 diabetes mellitus with unspecified complications: Secondary | ICD-10-CM

## 2017-12-07 DIAGNOSIS — J9611 Chronic respiratory failure with hypoxia: Secondary | ICD-10-CM

## 2017-12-07 DIAGNOSIS — E7800 Pure hypercholesterolemia, unspecified: Secondary | ICD-10-CM

## 2017-12-07 DIAGNOSIS — M810 Age-related osteoporosis without current pathological fracture: Secondary | ICD-10-CM

## 2017-12-07 DIAGNOSIS — J449 Chronic obstructive pulmonary disease, unspecified: Secondary | ICD-10-CM | POA: Diagnosis not present

## 2017-12-07 DIAGNOSIS — Z23 Encounter for immunization: Secondary | ICD-10-CM

## 2017-12-07 NOTE — Progress Notes (Signed)
Subjective:    Patient ID: Briana Barr, female    DOB: [birth year], 77 y.o.   MRN: [id number]  HPI Patient is a 77 year old Caucasian female here today for a annual wellness visit.  Past medical history is significant for controlled type 2 diabetes mellitus currently on glimepiride and metformin.  She has not had a hemoglobin A1c in over a year.  Her last hemoglobin A1c however was well controlled at less than 6.  She states that she is not checking her sugars very often however she denies any episodes of hypoglycemia.  She does have a history of proliferative diabetic retinopathy and has not seen an eye specialist in more than a year.  Therefore she is long overdue for diabetic eye exam.  She has had Prevnar 13 however she is due for Pneumovax 23 along with a flu shot.  She has end-stage COPD that is oxygen dependent currently treated with Brovana and Spiriva.  However she states that her breathing has been stable over the last year and she has not had any recent COPD exacerbations.  She denies any problems with depression.  She currently takes Lexapro and states that her mood is good.  She denies any problems with memory loss.  She denies any problems on her functional screen aside from trouble hearing.  On examination today she has bilateral cerumen impactions and she is requesting that we remove these with irrigation/lavage.  Because of her age, she no longer requires Pap smears.  Due to her frailty and end-stage COPD I have recommended against a colonoscopy.  Past Medical History:  Diagnosis Date  . Barrett esophagus   . Colon polyps    benign  . COPD (chronic obstructive pulmonary disease) ([place])   . Duodenal ulcer   . DVT (deep venous thrombosis) ([facility])    left leg x 2  . Gallstones   . GERD (gastroesophageal reflux disease)   . Glaucoma   . Hyperlipidemia   . Ischemic colitis ([place])   . Osteoporosis   . Pneumonia    hx of x 2 as a child   . Renal cyst, right   . Retinopathy,  background, proliferative    Past Surgical History:  Procedure Laterality Date  . cervical vertebral fusion     c5-6, 6-7  . CHOLECYSTECTOMY    . COLONOSCOPY WITH PROPOFOL N/A 11/23/2016   Procedure: COLONOSCOPY WITH PROPOFOL;  Surgeon: Ladene Artist, MD;  Location: [facility];  Service: Endoscopy;  Laterality: N/A;  . ESOPHAGOGASTRODUODENOSCOPY (EGD) WITH PROPOFOL N/A 11/23/2016   Procedure: ESOPHAGOGASTRODUODENOSCOPY (EGD) WITH PROPOFOL;  Surgeon: Ladene Artist, MD;  Location: [facility];  Service: Endoscopy;  Laterality: N/A;  . FEMORAL-POPLITEAL BYPASS GRAFT      Current Outpatient Medications on File Prior to Visit  Medication Sig Dispense Refill  . albuterol (PROAIR HFA) 108 (90 Base) MCG/ACT inhaler Inhale into the lungs every 6 (six) hours as needed for wheezing or shortness of breath.    Marland Kitchen alendronate (FOSAMAX) 70 MG tablet TAKE 1 TABLET BY MOUTH ONCE A WEEK ON AN EMPTY STOMACH WITH A FULL GLASS OF WATER 4 tablet 8  . arformoterol (BROVANA) 15 MCG/2ML NEBU Take 15 mcg by nebulization 2 (two) times daily.    Marland Kitchen aspirin 81 MG tablet Take 81 mg by mouth daily.    . Cholecalciferol (VITAMIN D3) 1000 units CAPS Take 1 capsule by mouth daily.    Marland Kitchen CINNAMON PO Take 1,000 mg by mouth daily.    . clotrimazole-betamethasone (  LOTRISONE) cream Apply 1 application topically 2 (two) times daily. To feet    . Dextromethorphan-Guaifenesin (DIABETIC TUSSIN DM PO) Take 1 tablet by mouth at bedtime as needed. FOR COUGH    . dextromethorphan-guaiFENesin (MUCINEX DM) 30-600 MG 12hr tablet Take 1 tablet by mouth daily as needed for cough.    . docusate sodium (COLACE) 100 MG capsule Take 100 mg by mouth 2 (two) times daily as needed for mild constipation.    Marland Kitchen escitalopram (LEXAPRO) 10 MG tablet TAKE 1 TABLET BY MOUTH EVERY DAY 90 tablet 3  . fluticasone (FLONASE) 50 MCG/ACT nasal spray Place 2 sprays into both nostrils daily.    Marland Kitchen glimepiride (AMARYL) 1 MG tablet Take 1 tablet (1 mg total) by  mouth daily with breakfast. 30 tablet 5  . glucose blood test strip 1 each by Other route as needed for other. ascensia contour test strips strips-  Dx: E11.9 Check BS qam 50 each 5  . HYDROcodone-acetaminophen (NORCO) 5-325 MG tablet Take 1 tablet by mouth 3 (three) times daily as needed for moderate pain. 90 tablet 0  . metFORMIN (GLUCOPHAGE) 500 MG tablet TAKE 1 TABLET BY MOUTH TWICE A DAY WITH A MEAL 60 tablet 0  . MICROLET LANCETS MISC Check sugars daily 30 each 11  . mirtazapine (REMERON) 30 MG tablet Take 1 tablet (30 mg total) by mouth at bedtime. 30 tablet 3  . Multiple Vitamin (MULTIVITAMIN) tablet Take 1 tablet by mouth daily. Centrum Silver 50+ women    . Omega-3 Fatty Acids (FISH OIL) 1000 MG CPDR Take 1,000 mg by mouth daily.    Marland Kitchen omeprazole (PRILOSEC) 20 MG capsule TAKE ONE CAPSULE BY MOUTH TWICE A DAY BEFORE A MEAL 180 capsule 3  . OXYGEN Inhale 2 L into the lungs continuous.    . pseudoephedrine ([employer] NASAL DECONGESTANT) 30 MG tablet Take 30 mg by mouth 2 (two) times daily as needed for congestion.    . simvastatin (ZOCOR) 40 MG tablet Take 1 tablet (40 mg total) daily by mouth. 90 tablet 1  . SPIRIVA HANDIHALER 18 MCG inhalation capsule INHALE 1 CAPSULE VIA HANDIHALER ONCE DAILY AT THE SAME TIME EVERY DAY 30 capsule 3   No current facility-administered medications on file prior to visit.    Allergies  Allergen Reactions  . Vioxx [Rofecoxib] Swelling   Social History   Socioeconomic History  . Marital status: Widowed    Spouse name: Not on file  . Number of children: 3  . Years of education: Not on file  . Highest education level: Not on file  Occupational History  . Occupation: retired  Scientific laboratory technician  . Financial resource strain: Not on file  . Food insecurity:    Worry: Not on file    Inability: Not on file  . Transportation needs:    Medical: Not on file    Non-medical: Not on file  Tobacco Use  . Smoking status: Current Every Day Smoker    Packs/day: 1.00     Types: Cigarettes  . Smokeless tobacco: Never Used  . Tobacco comment: started age 89  Substance and Sexual Activity  . Alcohol use: No  . Drug use: No  . Sexual activity: Not on file  Lifestyle  . Physical activity:    Days per week: Not on file    Minutes per session: Not on file  . Stress: Not on file  Relationships  . Social connections:    Talks on phone: Not on file  Gets together: Not on file    Attends religious service: Not on file    Active member of club or organization: Not on file    Attends meetings of clubs or organizations: Not on file    Relationship status: Not on file  . Intimate partner violence:    Fear of current or ex partner: Not on file    Emotionally abused: Not on file    Physically abused: Not on file    Forced sexual activity: Not on file  Other Topics Concern  . Not on file  Social History Narrative  . Not on file   Family History  Problem Relation Age of Onset  . Other Father        some sort of blood cancer-had to get transfusions  . Stomach cancer Maternal Grandmother   . Stomach cancer Maternal Grandfather       Review of Systems  All other systems reviewed and are negative.      Objective:   Physical Exam  Constitutional: She is oriented to person, place, and time. She appears well-developed and well-nourished. No distress.  HENT:  Head: Normocephalic and atraumatic.  Right Ear: External ear normal.  Left Ear: External ear normal.  Nose: Nose normal.  Mouth/Throat: Oropharynx is clear and moist. No oropharyngeal exudate.  Eyes: Conjunctivae are normal. Right eye exhibits no discharge. Left eye exhibits no discharge. No scleral icterus.  Neck: Neck supple. No JVD present. No thyromegaly present.  Cardiovascular: Normal rate, regular rhythm, normal heart sounds and intact distal pulses. Exam reveals no gallop and no friction rub.  No murmur heard. Pulmonary/Chest: Effort normal. She has wheezes.  Abdominal: Soft. Bowel  sounds are normal. She exhibits no distension and no mass. There is no tenderness. There is no rebound and no guarding.  Musculoskeletal: She exhibits no edema.       Cervical back: She exhibits decreased range of motion and tenderness.       Lumbar back: She exhibits decreased range of motion and tenderness.  Lymphadenopathy:    She has no cervical adenopathy.  Neurological: She is alert and oriented to person, place, and time. She has normal reflexes. No cranial nerve deficit. She exhibits normal muscle tone. Coordination normal.  Skin: Skin is warm. No rash noted. She is not diaphoretic. No erythema. No pallor.  Psychiatric: She has a normal mood and affect. Her behavior is normal. Judgment and thought content normal.  Vitals reviewed.         Assessment & Plan:  Controlled type 2 diabetes mellitus with complication, without long-term current use of insulin ([place]) - Plan: CBC with Differential/Platelet, COMPLETE METABOLIC PANEL WITH GFR, Lipid panel, Microalbumin, urine, Hemoglobin A1c, Ambulatory referral to Ophthalmology, Pneumococcal polysaccharide vaccine 23-valent greater than or equal to 2yo subcutaneous/IM, Flu Vaccine QUAD 36+ mos IM, Ambulatory referral to Podiatry, Ambulatory referral to Ophthalmology  Need for prophylactic vaccination against Streptococcus pneumoniae (pneumococcus) and influenza - Plan: Pneumococcal polysaccharide vaccine 23-valent greater than or equal to 2yo subcutaneous/IM, Flu Vaccine QUAD 36+ mos IM  COPD mixed type ([place])  Pure hypercholesterolemia  Chronic respiratory failure with hypoxia ([place])  General medical exam  Osteoporosis, unspecified osteoporosis type, unspecified pathological fracture presence - Plan: DG Bone Density  Acquired deformity of toenail - Plan: Ambulatory referral to Podiatry  Patient's diabetic foot exam is performed today and is significant for thick toenails that the patient is unable to cut herself.  She is requesting that  I schedule her to see  a podiatrist which I will arrange.  She also has a history of diabetic retinopathy and has not seen an eye specialist in more than a year.  Therefore I will consult ophthalmology for this as well.  She has a history of osteoporosis and has not had a bone density test in more than 2 years.  She is currently taking Fosamax.  I will schedule the patient for a bone density test to determine the efficacy of her treatment and to determine if we also need to do more.  Regarding her diabetes, I will check a CBC, CMP, fasting lipid panel, hemoglobin A1c, and a urine microalbumin.  Her blood pressure today is adequately controlled at 140/82.  If her hemoglobin A1c is less than 6, I will discontinue glimepiride due to the risk of hypoglycemia in the elderly.  Patient received Pneumovax 23 today.  She also received a flu shot.  Prevnar 13 is up-to-date she does not require Pap smear.  I have recommended against a colonoscopy because of her age and the severity of her COPD.  My nurse removed her cerumen impactions with irrigation and lavage.

## 2017-12-08 LAB — COMPLETE METABOLIC PANEL WITH GFR
AG RATIO: 1.6 (calc) (ref 1.0–2.5)
ALBUMIN MSPROF: 4.5 g/dL (ref 3.6–5.1)
ALT: 23 U/L (ref 6–29)
AST: 18 U/L (ref 10–35)
Alkaline phosphatase (APISO): 55 U/L (ref 33–130)
BUN/Creatinine Ratio: 60 (calc) — ABNORMAL HIGH (ref 6–22)
BUN: 36 mg/dL — ABNORMAL HIGH (ref 7–25)
CALCIUM: 10 mg/dL (ref 8.6–10.4)
CO2: 33 mmol/L — ABNORMAL HIGH (ref 20–32)
Chloride: 100 mmol/L (ref 98–110)
Creat: 0.6 mg/dL (ref 0.60–0.93)
GFR, EST AFRICAN AMERICAN: 103 mL/min/{1.73_m2} (ref >=60)
GFR, EST NON AFRICAN AMERICAN: 89 mL/min/{1.73_m2} (ref >=60)
GLOBULIN: 2.9 g/dL (ref 1.9–3.7)
Glucose, Bld: 137 mg/dL — ABNORMAL HIGH (ref 65–99)
POTASSIUM: 4.3 mmol/L (ref 3.5–5.3)
SODIUM: 143 mmol/L (ref 135–146)
TOTAL PROTEIN: 7.4 g/dL (ref 6.1–8.1)
Total Bilirubin: 0.3 mg/dL (ref 0.2–1.2)

## 2017-12-08 LAB — CBC WITH DIFFERENTIAL/PLATELET
BASOS PCT: 0.7 %
Basophils Absolute: 60 cells/uL (ref 0–200)
Eosinophils Absolute: 213 cells/uL (ref 15–500)
Eosinophils Relative: 2.5 %
HEMATOCRIT: 51.6 % — AB (ref 35.0–45.0)
Hemoglobin: 16.7 g/dL — ABNORMAL HIGH (ref 11.7–15.5)
LYMPHS ABS: 1726 {cells}/uL (ref 850–3900)
MCH: 29.8 pg (ref 27.0–33.0)
MCHC: 32.4 g/dL (ref 32.0–36.0)
MCV: 92 fL (ref 80.0–100.0)
MPV: 10.5 fL (ref 7.5–12.5)
Monocytes Relative: 12.1 %
Neutro Abs: 5474 cells/uL (ref 1500–7800)
Neutrophils Relative %: 64.4 %
Platelets: 448 10*3/uL — ABNORMAL HIGH (ref 140–400)
RBC: 5.61 10*6/uL — ABNORMAL HIGH (ref 3.80–5.10)
RDW: 13.1 % (ref 11.0–15.0)
Total Lymphocyte: 20.3 %
WBC: 8.5 10*3/uL (ref 3.8–10.8)
WBCMIX: 1029 {cells}/uL — AB (ref 200–950)

## 2017-12-08 LAB — HEMOGLOBIN A1C
HEMOGLOBIN A1C: 6.6 %{Hb} — AB (ref <5.7)
Mean Plasma Glucose: 143 (calc)
eAG (mmol/L): 7.9 (calc)

## 2017-12-08 LAB — LIPID PANEL
Cholesterol: 188 mg/dL (ref <200)
HDL: 56 mg/dL (ref >50)
LDL Cholesterol (Calc): 110 mg/dL (calc) — ABNORMAL HIGH
NON-HDL CHOLESTEROL (CALC): 132 mg/dL — AB (ref <130)
TRIGLYCERIDES: 112 mg/dL (ref <150)
Total CHOL/HDL Ratio: 3.4 (calc) (ref <5.0)

## 2017-12-08 LAB — MICROALBUMIN, URINE: Microalb, Ur: 1.2 mg/dL

## 2017-12-23 ENCOUNTER — Other Ambulatory Visit: Payer: Self-pay | Admitting: Family Medicine

## 2017-12-23 MED ORDER — ROSUVASTATIN CALCIUM 20 MG PO TABS: 20.0000 mg | ORAL_TABLET | Freq: Every day | ORAL | 1 refills | Status: DC

## 2017-12-30 DIAGNOSIS — H43811 Vitreous degeneration, right eye: Secondary | ICD-10-CM | POA: Diagnosis not present

## 2017-12-30 DIAGNOSIS — E119 Type 2 diabetes mellitus without complications: Secondary | ICD-10-CM | POA: Diagnosis not present

## 2017-12-30 DIAGNOSIS — H25012 Cortical age-related cataract, left eye: Secondary | ICD-10-CM | POA: Diagnosis not present

## 2017-12-30 DIAGNOSIS — H2512 Age-related nuclear cataract, left eye: Secondary | ICD-10-CM | POA: Diagnosis not present

## 2017-12-30 DIAGNOSIS — H43812 Vitreous degeneration, left eye: Secondary | ICD-10-CM | POA: Diagnosis not present

## 2017-12-30 LAB — HM DIABETES EYE EXAM

## 2018-01-03 ENCOUNTER — Encounter: Payer: Self-pay | Admitting: *Deleted

## 2018-01-03 ENCOUNTER — Other Ambulatory Visit: Payer: Self-pay | Admitting: Family Medicine

## 2018-01-28 ENCOUNTER — Telehealth: Payer: Self-pay | Admitting: Family Medicine

## 2018-01-28 NOTE — Telephone Encounter (Signed)
Received a phone call from [place] with [employer] stating that they went out to service patient's oxygen and while they were in the home the patient was smoking while wearing the oxygen. They educated the patient on the dangers of smoking with oxygen therapy and wanted to put up no smoking signs in the patient home. Patient's daughter informed her that patient "smokes like a freight train" so putting up the signs would be pointless. So they have patient listed as being non complaint and they just wanted to give [place] an FYI.

## 2018-02-04 DIAGNOSIS — H5203 Hypermetropia, bilateral: Secondary | ICD-10-CM | POA: Diagnosis not present

## 2018-02-04 DIAGNOSIS — Z961 Presence of intraocular lens: Secondary | ICD-10-CM | POA: Diagnosis not present

## 2018-02-04 DIAGNOSIS — H2512 Age-related nuclear cataract, left eye: Secondary | ICD-10-CM | POA: Diagnosis not present

## 2018-02-08 ENCOUNTER — Ambulatory Visit
Admission: RE | Admit: 2018-02-08 | Discharge: 2018-02-08 | Disposition: A | Discharge status: Home / Self Care | Payer: Medicare Other | Source: Ambulatory Visit | Attending: Family Medicine | Admitting: Family Medicine

## 2018-02-08 DIAGNOSIS — Z780 Asymptomatic menopausal state: Secondary | ICD-10-CM | POA: Diagnosis not present

## 2018-02-08 DIAGNOSIS — M810 Age-related osteoporosis without current pathological fracture: Secondary | ICD-10-CM

## 2018-03-08 ENCOUNTER — Other Ambulatory Visit: Payer: Self-pay | Admitting: Family Medicine

## 2018-06-17 ENCOUNTER — Emergency Department (HOSPITAL_COMMUNITY)
Admission: EM | Admit: 2018-06-17 | Discharge: 2018-06-17 | Disposition: A | Discharge status: Home / Self Care | Payer: Medicare Other | Attending: Emergency Medicine | Admitting: Emergency Medicine

## 2018-06-17 ENCOUNTER — Encounter (HOSPITAL_COMMUNITY): Payer: Self-pay | Admitting: Emergency Medicine

## 2018-06-17 ENCOUNTER — Emergency Department (HOSPITAL_COMMUNITY): Payer: Medicare Other

## 2018-06-17 ENCOUNTER — Other Ambulatory Visit: Payer: Self-pay

## 2018-06-17 DIAGNOSIS — R1030 Lower abdominal pain, unspecified: Secondary | ICD-10-CM | POA: Diagnosis not present

## 2018-06-17 DIAGNOSIS — F17210 Nicotine dependence, cigarettes, uncomplicated: Secondary | ICD-10-CM | POA: Insufficient documentation

## 2018-06-17 DIAGNOSIS — Z79899 Other long term (current) drug therapy: Secondary | ICD-10-CM | POA: Diagnosis not present

## 2018-06-17 DIAGNOSIS — R109 Unspecified abdominal pain: Secondary | ICD-10-CM | POA: Diagnosis present

## 2018-06-17 DIAGNOSIS — Z7982 Long term (current) use of aspirin: Secondary | ICD-10-CM | POA: Insufficient documentation

## 2018-06-17 DIAGNOSIS — Z7984 Long term (current) use of oral hypoglycemic drugs: Secondary | ICD-10-CM | POA: Insufficient documentation

## 2018-06-17 DIAGNOSIS — N200 Calculus of kidney: Secondary | ICD-10-CM

## 2018-06-17 DIAGNOSIS — Z9049 Acquired absence of other specified parts of digestive tract: Secondary | ICD-10-CM | POA: Diagnosis not present

## 2018-06-17 DIAGNOSIS — J449 Chronic obstructive pulmonary disease, unspecified: Secondary | ICD-10-CM | POA: Diagnosis not present

## 2018-06-17 DIAGNOSIS — R112 Nausea with vomiting, unspecified: Secondary | ICD-10-CM | POA: Diagnosis not present

## 2018-06-17 DIAGNOSIS — N132 Hydronephrosis with renal and ureteral calculous obstruction: Secondary | ICD-10-CM | POA: Diagnosis not present

## 2018-06-17 LAB — URINALYSIS, ROUTINE W REFLEX MICROSCOPIC
Bilirubin Urine: NEGATIVE
Glucose, UA: >=500 mg/dL — AB
HGB URINE DIPSTICK: NEGATIVE
Ketones, ur: 5 mg/dL — AB
Leukocytes,Ua: NEGATIVE
Nitrite: NEGATIVE
Protein, ur: NEGATIVE mg/dL
Specific Gravity, Urine: 1.017 (ref 1.005–1.030)
pH: 7 (ref 5.0–8.0)

## 2018-06-17 LAB — COMPREHENSIVE METABOLIC PANEL
ALBUMIN: 4.5 g/dL (ref 3.5–5.0)
ALT: 26 U/L (ref 0–44)
AST: 25 U/L (ref 15–41)
Alkaline Phosphatase: 54 U/L (ref 38–126)
Anion gap: 8 (ref 5–15)
BUN: 22 mg/dL (ref 8–23)
CO2: 30 mmol/L (ref 22–32)
Calcium: 9.6 mg/dL (ref 8.9–10.3)
Chloride: 100 mmol/L (ref 98–111)
Creatinine, Ser: 0.63 mg/dL (ref 0.44–1.00)
GFR calc Af Amer: >60 mL/min (ref >60)
GFR calc non Af Amer: >60 mL/min (ref >60)
GLUCOSE: 223 mg/dL — AB (ref 70–99)
Potassium: 4.2 mmol/L (ref 3.5–5.1)
SODIUM: 138 mmol/L (ref 135–145)
Total Bilirubin: 0.5 mg/dL (ref 0.3–1.2)
Total Protein: 7.8 g/dL (ref 6.5–8.1)

## 2018-06-17 LAB — CBC
HCT: 51.4 % — ABNORMAL HIGH (ref 36.0–46.0)
Hemoglobin: 15.3 g/dL — ABNORMAL HIGH (ref 12.0–15.0)
MCH: 30.4 pg (ref 26.0–34.0)
MCHC: 29.8 g/dL — ABNORMAL LOW (ref 30.0–36.0)
MCV: 102.2 fL — ABNORMAL HIGH (ref 80.0–100.0)
Platelets: 466 10*3/uL — ABNORMAL HIGH (ref 150–400)
RBC: 5.03 MIL/uL (ref 3.87–5.11)
RDW: 13.2 % (ref 11.5–15.5)
WBC: 13.7 10*3/uL — ABNORMAL HIGH (ref 4.0–10.5)
nRBC: 0 % (ref 0.0–0.2)

## 2018-06-17 LAB — I-STAT TROPONIN, ED: Troponin i, poc: 0.01 ng/mL (ref 0.00–0.08)

## 2018-06-17 LAB — LIPASE, BLOOD: Lipase: 35 U/L (ref 11–51)

## 2018-06-17 LAB — I-STAT CREATININE, ED: Creatinine, Ser: 0.6 mg/dL (ref 0.44–1.00)

## 2018-06-17 MED ORDER — IOPAMIDOL (ISOVUE-300) INJECTION 61%
100.0000 mL | Freq: Once | INTRAVENOUS | Status: AC | PRN
  Administered 2018-06-17: 100 mL via INTRAVENOUS

## 2018-06-17 MED ORDER — SODIUM CHLORIDE (PF) 0.9 % IJ SOLN
INTRAMUSCULAR | Status: AC
  Filled 2018-06-17: qty 50

## 2018-06-17 MED ORDER — TAMSULOSIN HCL 0.4 MG PO CAPS: 0.4000 mg | ORAL_CAPSULE | Freq: Every day | ORAL | 0 refills | Status: DC

## 2018-06-17 MED ORDER — ONDANSETRON HCL 4 MG/2ML IJ SOLN
4.0000 mg | Freq: Once | INTRAMUSCULAR | Status: AC
  Administered 2018-06-17: 4 mg via INTRAVENOUS
  Filled 2018-06-17: qty 2

## 2018-06-17 MED ORDER — SODIUM CHLORIDE 0.9 % IV BOLUS
1000.0000 mL | Freq: Once | INTRAVENOUS | Status: AC
  Administered 2018-06-17: 1000 mL via INTRAVENOUS

## 2018-06-17 MED ORDER — MORPHINE SULFATE (PF) 4 MG/ML IV SOLN
4.0000 mg | Freq: Once | INTRAVENOUS | Status: AC
  Administered 2018-06-17: 4 mg via INTRAVENOUS
  Filled 2018-06-17: qty 1

## 2018-06-17 MED ORDER — MORPHINE SULFATE 15 MG PO TABS: 15.0000 mg | ORAL_TABLET | ORAL | 0 refills | Status: DC | PRN

## 2018-06-17 MED ORDER — IOPAMIDOL (ISOVUE-300) INJECTION 61%
INTRAVENOUS | Status: AC
  Filled 2018-06-17: qty 100

## 2018-06-17 MED ORDER — KETOROLAC TROMETHAMINE 30 MG/ML IJ SOLN
15.0000 mg | Freq: Once | INTRAMUSCULAR | Status: AC
  Administered 2018-06-17: 15 mg via INTRAVENOUS
  Filled 2018-06-17: qty 1

## 2018-06-17 MED ORDER — ONDANSETRON 4 MG PO TBDP: ORAL_TABLET | ORAL | 0 refills | Status: DC

## 2018-06-17 NOTE — ED Provider Notes (Signed)
[facility] DEPT Provider Note   CSN: [id number] Arrival date & time: 06/17/18  1850    History   Chief Complaint Chief Complaint  Patient presents with  . Abdominal Pain  . Emesis    HPI Taygan Khalid is a 78 y.o. female.     78 yo F with a chief complaint of left-sided abdominal pain nausea and vomiting.  This been going on just today.  Worse with movement and palpation.  She has taken pain medicine for this at home with some mild improvement.  Pain seems to come and go.  Denies urinary symptoms.  Has a history of a cholecystectomy.  Denies other abdominal surgery.  Denies vaginal bleeding or discharge.  The history is provided by the patient.  Abdominal Pain  Pain location:  L flank Pain quality: sharp and shooting   Pain radiates to:  Does not radiate Pain severity:  Severe Onset quality:  Gradual Duration:  1 day Timing:  Constant Progression:  Unchanged Chronicity:  New Relieved by:  Nothing Worsened by:  Movement and palpation Ineffective treatments:  None tried Associated symptoms: nausea and vomiting   Associated symptoms: no chest pain, no chills, no dysuria, no fever and no shortness of breath   Emesis  Associated symptoms: abdominal pain   Associated symptoms: no arthralgias, no chills, no fever, no headaches and no myalgias     Past Medical History:  Diagnosis Date  . Barrett esophagus   . Colon polyps    benign  . COPD (chronic obstructive pulmonary disease) ([place])   . Duodenal ulcer   . DVT (deep venous thrombosis) ([facility])    left leg x 2  . Gallstones   . GERD (gastroesophageal reflux disease)   . Glaucoma   . Hyperlipidemia   . Ischemic colitis ([place])   . Osteoporosis   . Pneumonia    hx of x 2 as a child   . Renal cyst, right   . Retinopathy, background, proliferative     Patient Active Problem List   Diagnosis Date Noted  . Abnormal CT scan, colon   . Benign neoplasm of sigmoid colon   . Benign neoplasm of  rectum   . Loss of weight   . Epigastric abdominal tenderness without rebound tenderness   . Anorexia     Past Surgical History:  Procedure Laterality Date  . cervical vertebral fusion     c5-6, 6-7  . CHOLECYSTECTOMY    . COLONOSCOPY WITH PROPOFOL N/A 11/23/2016   Procedure: COLONOSCOPY WITH PROPOFOL;  Surgeon: Ladene Artist, MD;  Location: [facility];  Service: Endoscopy;  Laterality: N/A;  . ESOPHAGOGASTRODUODENOSCOPY (EGD) WITH PROPOFOL N/A 11/23/2016   Procedure: ESOPHAGOGASTRODUODENOSCOPY (EGD) WITH PROPOFOL;  Surgeon: Ladene Artist, MD;  Location: [facility];  Service: Endoscopy;  Laterality: N/A;  . FEMORAL-POPLITEAL BYPASS GRAFT       OB History   No obstetric history on file.      Home Medications    Prior to Admission medications   Medication Sig Start Date End Date Taking? Authorizing Provider  acetaZOLAMIDE (DIAMOX) 250 MG tablet TAKE 1 TABLET BY MOUTH EVERY DAY 12/07/17   [name], MD  albuterol (PROAIR HFA) 108 (90 Base) MCG/ACT inhaler Inhale into the lungs every 6 (six) hours as needed for wheezing or shortness of breath.    [provider]  alendronate (FOSAMAX) 70 MG tablet TAKE 1 TABLET BY MOUTH ONCE A WEEK ON AN EMPTY STOMACH WITH A FULL  GLASS OF WATER 07/26/17   [name], MD  arformoterol [facility]) 15 MCG/2ML NEBU Take 15 mcg by nebulization 2 (two) times daily.    [provider]  aspirin 81 MG tablet Take 81 mg by mouth daily.    [provider]  Cholecalciferol (VITAMIN D3) 1000 units CAPS Take 1 capsule by mouth daily.    [provider]  CINNAMON PO Take 1,000 mg by mouth daily.    [provider]  clotrimazole-betamethasone (LOTRISONE) cream Apply 1 application topically 2 (two) times daily. To feet    [provider]  Dextromethorphan-Guaifenesin (DIABETIC TUSSIN DM PO) Take 1 tablet by mouth at bedtime as needed. FOR COUGH    [provider]    dextromethorphan-guaiFENesin (MUCINEX DM) 30-600 MG 12hr tablet Take 1 tablet by mouth daily as needed for cough.    [provider]  docusate sodium (COLACE) 100 MG capsule Take 100 mg by mouth 2 (two) times daily as needed for mild constipation.    [provider]  escitalopram (LEXAPRO) 10 MG tablet TAKE 1 TABLET BY MOUTH EVERY DAY 09/24/17   [name], MD  fluticasone (FLONASE) 50 MCG/ACT nasal spray Place 2 sprays into both nostrils daily.    [provider]  glimepiride (AMARYL) 1 MG tablet TAKE 1 TABLET BY MOUTH IN THE MORNING 03/08/18   [name], MD  glucose blood test strip 1 each by Other route as needed for other. ascensia contour test strips strips-  Dx: E11.9 Check BS qam 08/10/16   [name], MD  HYDROcodone-acetaminophen (NORCO) 5-325 MG tablet Take 1 tablet by mouth 3 (three) times daily as needed for moderate pain. 03/02/17   [name], MD  metFORMIN (GLUCOPHAGE) 500 MG tablet TAKE 1 TABLET BY MOUTH TWICE A DAY WITH MEALS 01/03/18   [name], MD  MICROLET LANCETS MISC Check sugars daily 11/25/15   [name], MD  mirtazapine (REMERON) 30 MG tablet Take 1 tablet (30 mg total) by mouth at bedtime. 09/22/16   [name], MD  morphine (MSIR) 15 MG tablet Take 1 tablet (15 mg total) by mouth every 4 (four) hours as needed for severe pain. 06/17/18   Deno Etienne, DO  Multiple Vitamin (MULTIVITAMIN) tablet Take 1 tablet by mouth daily. Centrum Silver 50+ women    [provider]  Omega-3 Fatty Acids (FISH OIL) 1000 MG CPDR Take 1,000 mg by mouth daily.    [provider]  omeprazole (PRILOSEC) 20 MG capsule TAKE ONE CAPSULE BY MOUTH TWICE A DAY BEFORE A MEAL 11/08/17   [name], MD  ondansetron (ZOFRAN ODT) 4 MG disintegrating tablet 4mg  ODT q4 hours prn nausea/vomit 06/17/18   Deno Etienne, DO  OXYGEN Inhale 2 L into the lungs continuous.    [provider]  pseudoephedrine ([employer]  NASAL DECONGESTANT) 30 MG tablet Take 30 mg by mouth 2 (two) times daily as needed for congestion.    [provider]  rosuvastatin (CRESTOR) 20 MG tablet Take 1 tablet (20 mg total) by mouth daily. 12/23/17   [name], MD  simvastatin (ZOCOR) 40 MG tablet Take 1 tablet (40 mg total) daily by mouth. 02/24/17   [name], MD  SPIRIVA HANDIHALER 18 MCG inhalation capsule INHALE 1 CAPSULE VIA HANDIHALER ONCE DAILY AT THE SAME TIME EVERY DAY 05/12/17   [name], MD  tamsulosin (FLOMAX) 0.4 MG CAPS capsule Take 1 capsule (0.4 mg total)  by mouth daily after supper. 06/17/18   Deno Etienne, DO    Family History Family History  Problem Relation Age of Onset  . Other Father        some sort of blood cancer-had to get transfusions  . Stomach cancer Maternal Grandmother   . Stomach cancer Maternal Grandfather     Social History Social History   Tobacco Use  . Smoking status: Current Every Day Smoker    Packs/day: 1.00    Types: Cigarettes  . Smokeless tobacco: Never Used  . Tobacco comment: started age 21  Substance Use Topics  . Alcohol use: No  . Drug use: No     Allergies   Vioxx [rofecoxib]   Review of Systems Review of Systems  Constitutional: Negative for chills and fever.  HENT: Negative for congestion and rhinorrhea.   Eyes: Negative for redness and visual disturbance.  Respiratory: Negative for shortness of breath and wheezing.   Cardiovascular: Negative for chest pain and palpitations.  Gastrointestinal: Positive for abdominal pain, nausea and vomiting.  Genitourinary: Positive for flank pain. Negative for dysuria and urgency.  Musculoskeletal: Negative for arthralgias and myalgias.  Skin: Negative for pallor and wound.  Neurological: Negative for dizziness and headaches.     Physical Exam Updated Vital Signs BP (!) 171/83 (BP Location: Right Arm)   Pulse 98   Temp 98.3 F (36.8 C) (Oral)   Resp 19   SpO2 97%   Physical  Exam Vitals signs and nursing note reviewed.  Constitutional:      General: She is not in acute distress.    Appearance: She is well-developed. She is not diaphoretic.  HENT:     Head: Normocephalic and atraumatic.  Eyes:     Pupils: Pupils are equal, round, and reactive to light.  Neck:     Musculoskeletal: Normal range of motion and neck supple.  Cardiovascular:     Rate and Rhythm: Normal rate and regular rhythm.     Heart sounds: No murmur. No friction rub. No gallop.   Pulmonary:     Effort: Pulmonary effort is normal.     Breath sounds: No wheezing or rales.  Abdominal:     General: There is no distension.     Palpations: Abdomen is soft.     Tenderness: There is abdominal tenderness.     Comments: Diffusely tender about the abdomen though worse in the right lower quadrant.  Musculoskeletal:        General: No tenderness.  Skin:    General: Skin is warm and dry.  Neurological:     Mental Status: She is alert and oriented to person, place, and time.  Psychiatric:        Behavior: Behavior normal.      ED Treatments / Results  Labs (all labs ordered are listed, but only abnormal results are displayed) Labs Reviewed  CBC - Abnormal; Notable for the following components:      Result Value   WBC 13.7 (*)    Hemoglobin 15.3 (*)    HCT 51.4 (*)    MCV 102.2 (*)    MCHC 29.8 (*)    Platelets 466 (*)    All other components within normal limits  URINALYSIS, ROUTINE W REFLEX MICROSCOPIC - Abnormal; Notable for the following components:   APPearance CLOUDY (*)    Glucose, UA >=500 (*)    Ketones, ur 5 (*)    Bacteria, UA RARE (*)    All other components within  normal limits  COMPREHENSIVE METABOLIC PANEL - Abnormal; Notable for the following components:   Glucose, Bld 223 (*)    All other components within normal limits  LIPASE, BLOOD  I-STAT CREATININE, ED  I-STAT TROPONIN, ED    EKG EKG Interpretation  Date/Time:  Friday June 17 2018 19:02:32  EST Ventricular Rate:  88 PR Interval:    QRS Duration: 93 QT Interval:  391 QTC Calculation: 474 R Axis:   92 Text Interpretation:  Sinus rhythm Right axis deviation No old tracing to compare Confirmed by Deno Etienne [phone]) on 06/17/2018 9:38:19 PM   Radiology Ct Abdomen Pelvis W Contrast  Result Date: 06/17/2018 CLINICAL DATA:  78 y/o F; left lower quadrant abdominal pain with nausea and vomiting. EXAM: CT ABDOMEN AND PELVIS WITH CONTRAST TECHNIQUE: Multidetector CT imaging of the abdomen and pelvis was performed using the standard protocol following bolus administration of intravenous contrast. CONTRAST:  142mL ISOVUE-300 IOPAMIDOL (ISOVUE-300) INJECTION 61% COMPARISON:  09/28/2016 CT abdomen and pelvis. 10/26/2016 MRI of the abdomen. FINDINGS: Lower chest: Stable 3 mm nodule in the right middle lobe (series 2, image 1) compatible benign etiology. Mitral annular and coronary artery calcific atherosclerosis. Hepatobiliary: No focal liver abnormality is seen. Status post cholecystectomy. No biliary dilatation. Pancreas: Unremarkable. No pancreatic ductal dilatation or surrounding inflammatory changes. Spleen: Normal in size without focal abnormality. Adrenals/Urinary Tract: 2.5 mm stone at the left ureterovesicular junction (series 2, image 70) with moderate left-sided hydronephrosis and perinephric stranding. Subcentimeter right kidney interpolar cyst. No focal kidney lesion is evident. No right kidney hydronephrosis. Normal adrenal glands. No additional urinary stone disease identified. Stomach/Bowel: Stomach is within normal limits. Appendix appears normal. No evidence of bowel wall thickening, distention, or inflammatory changes. Vascular/Lymphatic: Infrarenal abdominal aorta measures 2.5 cm. Aortic atherosclerosis with mixed plaque. No lymphadenopathy. Reproductive: Calcified uterine fundal myoma measuring 16 mm. Adnexa are unremarkable. Other: No abdominal wall hernia or abnormality. No  abdominopelvic ascites. Musculoskeletal: No fracture is seen. Mild levocurvature and spondylosis of the lumbar spine with discogenic degenerative changes greatest at L4-5 and lower lumbar facet arthrosis. Stable mild L3 superior endplate deformity, likely a chronic Schmorl's node. IMPRESSION: 1. 2.5 mm stone at the left ureterovesicular junction with moderate left-sided hydronephrosis and perinephric stranding. 2. Ectatic abdominal aorta at risk for aneurysm development. Recommend followup by ultrasound in 5 years. This recommendation follows ACR consensus guidelines: White Paper of the ACR Incidental Findings Committee II on Vascular Findings. J Am Coll Radiol 2013; 18:563-149. 3. Aortic Atherosclerosis (ICD10-I70.0). Electronically Signed   By: Kristine Garbe M.D.   On: 06/17/2018 22:34    Procedures Procedures (including critical care time)  Medications Ordered in ED Medications  iopamidol (ISOVUE-300) 61 % injection (has no administration in time range)  sodium chloride (PF) 0.9 % injection (has no administration in time range)  sodium chloride 0.9 % bolus 1,000 mL (0 mLs Intravenous Stopped 06/17/18 2254)  ondansetron (ZOFRAN) injection 4 mg (4 mg Intravenous Given 06/17/18 2113)  morphine 4 MG/ML injection 4 mg (4 mg Intravenous Given 06/17/18 2123)  iopamidol (ISOVUE-300) 61 % injection 100 mL (100 mLs Intravenous Contrast Given 06/17/18 2216)  ketorolac (TORADOL) 30 MG/ML injection 15 mg (15 mg Intravenous Given 06/17/18 2254)     Initial Impression / Assessment and Plan / ED Course  I have reviewed the triage vital signs and the nursing notes.  Pertinent labs & imaging results that were available during my care of the patient were reviewed by me and considered in my medical  decision making (see chart for details).        78 yo F with a chief complaint of left flank pain.  Going on since this morning.  Sounds like kidney stones by history though patient is most tender in the right  lower quadrant.  Will obtain a CT scan of the abdomen pelvis with contrast.  CT scan with left-sided hydronephrosis, 2.5 mm stone seen.  Otherwise unremarkable.  The patient's pain is well controlled on my reassessment.  UA without infection.  We will discharge the patient home.  Urology follow-up.  11:05 PM:  I have discussed the diagnosis/risks/treatment options with the patient and family and believe the pt to be eligible for discharge home to follow-up with PCP. We also discussed returning to the ED immediately if new or worsening sx occur. We discussed the sx which are most concerning (e.g., sudden worsening pain, fever, inability to tolerate by mouth) that necessitate immediate return. Medications administered to the patient during their visit and any new prescriptions provided to the patient are listed below.  Medications given during this visit Medications  iopamidol (ISOVUE-300) 61 % injection (has no administration in time range)  sodium chloride (PF) 0.9 % injection (has no administration in time range)  sodium chloride 0.9 % bolus 1,000 mL (0 mLs Intravenous Stopped 06/17/18 2254)  ondansetron (ZOFRAN) injection 4 mg (4 mg Intravenous Given 06/17/18 2113)  morphine 4 MG/ML injection 4 mg (4 mg Intravenous Given 06/17/18 2123)  iopamidol (ISOVUE-300) 61 % injection 100 mL (100 mLs Intravenous Contrast Given 06/17/18 2216)  ketorolac (TORADOL) 30 MG/ML injection 15 mg (15 mg Intravenous Given 06/17/18 2254)     The patient appears reasonably screen and/or stabilized for discharge and I doubt any other medical condition or other [facility] requiring further screening, evaluation, or treatment in the ED at this time prior to discharge.    Final Clinical Impressions(s) / ED Diagnoses   Final diagnoses:  Nephrolithiasis    ED Discharge Orders         Ordered    morphine (MSIR) 15 MG tablet  Every 4 hours PRN     06/17/18 2249    tamsulosin (FLOMAX) 0.4 MG CAPS capsule  Daily after supper      06/17/18 2249    ondansetron (ZOFRAN ODT) 4 MG disintegrating tablet     06/17/18 2249           Deno Etienne, DO 06/17/18 2305

## 2018-06-17 NOTE — ED Triage Notes (Signed)
Per [facility] pt from home for LLQ pains with n/v that started today. Pt was given Fentanyl 37mcg and Zofran 4mg  in route via 20g in let hand 160/90, 88HR, 16R, 98% on 2L O2, CBG 293.

## 2018-06-17 NOTE — Discharge Instructions (Addendum)
Take tylenol 1000mg (2 extra strength) four times a day.   If your doctor says you can take a NSAID: Take 2 over the counter ibuprofen tablets 3 times a day or 1 over-the-counter naproxen tablets twice a day for pain. Please ask your family doc if they feel these medications are safe for you.   There was an incidental finding on your CT scan.  Your aorta was noted to have a look consistent to one that may end up having an aneurysm.  Please discuss this with your family doctor for further imaging.   Take the Flomax as prescribed.  This medicine can make you lightheaded upon standing, remember this when you get up to use the bathroom in the night to take a second before you start walking.  Then take the pain medicine if you feel like you need it. Narcotics do not help with the pain, they only make you care about it less.  You can become addicted to this, people may break into your house to steal it.  It will constipate you.  If you drive under the influence of this medicine you can get a DUI.

## 2018-12-06 ENCOUNTER — Other Ambulatory Visit: Payer: Self-pay | Admitting: Family Medicine

## 2019-01-12 ENCOUNTER — Ambulatory Visit (INDEPENDENT_AMBULATORY_CARE_PROVIDER_SITE_OTHER): Payer: Medicare Other | Admitting: Family Medicine

## 2019-01-12 ENCOUNTER — Other Ambulatory Visit: Payer: Self-pay

## 2019-01-12 ENCOUNTER — Encounter: Payer: Self-pay | Admitting: Family Medicine

## 2019-01-12 VITALS — BP 178/101 | HR 106 | Temp 97.8°F | Resp 20 | Ht 64.0 in | Wt 130.0 lb

## 2019-01-12 DIAGNOSIS — Z23 Encounter for immunization: Secondary | ICD-10-CM

## 2019-01-12 DIAGNOSIS — M810 Age-related osteoporosis without current pathological fracture: Secondary | ICD-10-CM

## 2019-01-12 DIAGNOSIS — E7800 Pure hypercholesterolemia, unspecified: Secondary | ICD-10-CM | POA: Diagnosis not present

## 2019-01-12 DIAGNOSIS — E118 Type 2 diabetes mellitus with unspecified complications: Secondary | ICD-10-CM | POA: Diagnosis not present

## 2019-01-12 DIAGNOSIS — Z0001 Encounter for general adult medical examination with abnormal findings: Secondary | ICD-10-CM

## 2019-01-12 DIAGNOSIS — Z0000 Encounter for general adult medical examination without abnormal findings: Secondary | ICD-10-CM

## 2019-01-12 MED ORDER — LOSARTAN POTASSIUM 50 MG PO TABS: 50.0000 mg | ORAL_TABLET | Freq: Every day | ORAL | 3 refills | Status: DC

## 2019-01-12 NOTE — Addendum Note (Signed)
Addended by: Shary Decamp B on: 01/12/2019 11:54 AM   Modules accepted: Orders

## 2019-01-12 NOTE — Progress Notes (Signed)
Subjective:    Patient ID: Briana Barr, female    DOB: 1941-03-06, 78 y.o.   MRN: [id number]  HPI Patient is a 78 year old Caucasian female here today for a annual wellness visit.  Patient has oxygen dependent end-stage COPD.  Given that and her advanced age I have recommended against cancer screening such as a breast cancer screening with a mammogram or colonoscopy.  The patient is wheezing significantly on exam.  She is using albuterol occasionally for breakthrough shortness of breath and using 2 L of oxygen via nasal cannula.  However she has increased work of breathing simply walking into the exam room.  She is not on any kind of maintenance medication.  In fact the patient is uncertain of what medicine she is actually taking.  She has not been checking her blood sugars.  She denies any polyuria, polydipsia, or blurry vision.  She has a significantly elevated blood pressure of 178/101.  I allow the patient to rest in the exam room for 20 minutes and then rechecked her blood pressure myself.  It was still elevated at 160/90.  She denies any falls.  She denies any memory loss although she seems easily confused today.  However the vast majority of her exam was spent trying to update her medications accurately and determine what she is taking and managing her chronic acute conditions.  Diabetic foot exam was performed and is significant for dysmorphic toenails on both feet.  Also there are calluses on the dorsums and side of her right foot Past Medical History:  Diagnosis Date  . Barrett esophagus   . Colon polyps    benign  . COPD (chronic obstructive pulmonary disease) ([place])   . Duodenal ulcer   . DVT (deep venous thrombosis) ([facility])    left leg x 2  . Gallstones   . GERD (gastroesophageal reflux disease)   . Glaucoma   . Hyperlipidemia   . Ischemic colitis ([place])   . Osteoporosis   . Pneumonia    hx of x 2 as a child   . Renal cyst, right   . Retinopathy, background, proliferative     Past Surgical History:  Procedure Laterality Date  . cervical vertebral fusion     c5-6, 6-7  . CHOLECYSTECTOMY    . COLONOSCOPY WITH PROPOFOL N/A 11/23/2016   Procedure: COLONOSCOPY WITH PROPOFOL;  Surgeon: Ladene Artist, MD;  Location: [facility];  Service: Endoscopy;  Laterality: N/A;  . ESOPHAGOGASTRODUODENOSCOPY (EGD) WITH PROPOFOL N/A 11/23/2016   Procedure: ESOPHAGOGASTRODUODENOSCOPY (EGD) WITH PROPOFOL;  Surgeon: Ladene Artist, MD;  Location: [facility];  Service: Endoscopy;  Laterality: N/A;  . FEMORAL-POPLITEAL BYPASS GRAFT      Current Outpatient Medications on File Prior to Visit  Medication Sig Dispense Refill  . acetaZOLAMIDE (DIAMOX) 250 MG tablet TAKE 1 TABLET BY MOUTH EVERY DAY 90 tablet 1  . albuterol (PROAIR HFA) 108 (90 Base) MCG/ACT inhaler Inhale into the lungs every 6 (six) hours as needed for wheezing or shortness of breath.    Marland Kitchen aspirin 81 MG tablet Take 81 mg by mouth daily.    . metFORMIN (GLUCOPHAGE) 500 MG tablet TAKE 1 TABLET BY MOUTH TWICE A DAY WITH MEALS 60 tablet 0  . omeprazole (PRILOSEC) 20 MG capsule TAKE ONE CAPSULE BY MOUTH TWICE A DAY BEFORE A MEAL 180 capsule 3  . rosuvastatin (CRESTOR) 20 MG tablet Take 1 tablet (20 mg total) by mouth daily. 90 tablet 1   No current facility-administered medications on  file prior to visit.    Allergies  Allergen Reactions  . Vioxx [Rofecoxib] Swelling   Social History   Socioeconomic History  . Marital status: Widowed    Spouse name: Not on file  . Number of children: 3  . Years of education: Not on file  . Highest education level: Not on file  Occupational History  . Occupation: retired  Scientific laboratory technician  . Financial resource strain: Not on file  . Food insecurity    Worry: Not on file    Inability: Not on file  . Transportation needs    Medical: Not on file    Non-medical: Not on file  Tobacco Use  . Smoking status: Current Every Day Smoker    Packs/day: 1.00    Types: Cigarettes  .  Smokeless tobacco: Never Used  . Tobacco comment: started age 81  Substance and Sexual Activity  . Alcohol use: No  . Drug use: No  . Sexual activity: Not on file  Lifestyle  . Physical activity    Days per week: Not on file    Minutes per session: Not on file  . Stress: Not on file  Relationships  . Social Herbalist on phone: Not on file    Gets together: Not on file    Attends religious service: Not on file    Active member of club or organization: Not on file    Attends meetings of clubs or organizations: Not on file    Relationship status: Not on file  . Intimate partner violence    Fear of current or ex partner: Not on file    Emotionally abused: Not on file    Physically abused: Not on file    Forced sexual activity: Not on file  Other Topics Concern  . Not on file  Social History Narrative  . Not on file   Family History  Problem Relation Age of Onset  . Other Father        some sort of blood cancer-had to get transfusions  . Stomach cancer Maternal Grandmother   . Stomach cancer Maternal Grandfather       Review of Systems  All other systems reviewed and are negative.      Objective:   Physical Exam  Constitutional: She is oriented to person, place, and time. She appears well-developed and well-nourished. No distress.  HENT:  Head: Normocephalic and atraumatic.  Right Ear: External ear normal.  Left Ear: External ear normal.  Nose: Nose normal.  Mouth/Throat: Oropharynx is clear and moist. No oropharyngeal exudate.  Eyes: Conjunctivae are normal. Right eye exhibits no discharge. Left eye exhibits no discharge. No scleral icterus.  Neck: Neck supple. No JVD present. No thyromegaly present.  Cardiovascular: Normal rate, regular rhythm, normal heart sounds and intact distal pulses. Exam reveals no gallop and no friction rub.  No murmur heard. Pulmonary/Chest: Effort normal. She has wheezes.  Abdominal: Soft. Bowel sounds are normal. She  exhibits no distension and no mass. There is no abdominal tenderness. There is no rebound and no guarding.  Musculoskeletal:        General: No edema.     Cervical back: She exhibits decreased range of motion and tenderness.     Lumbar back: She exhibits decreased range of motion and tenderness.  Lymphadenopathy:    She has no cervical adenopathy.  Neurological: She is alert and oriented to person, place, and time. She has normal reflexes. No cranial nerve deficit. She  exhibits normal muscle tone. Coordination normal.  Skin: Skin is warm. No rash noted. She is not diaphoretic. No erythema. No pallor.  Psychiatric: She has a normal mood and affect. Her behavior is normal. Judgment and thought content normal.  Vitals reviewed.         Assessment & Plan:  Controlled type 2 diabetes mellitus with complication, without long-term current use of insulin ([place]) - Plan: CBC with Differential/Platelet, COMPLETE METABOLIC PANEL WITH GFR, Lipid panel, Microalbumin, urine, Hemoglobin A1c, Ambulatory referral to Podiatry  Pure hypercholesterolemia  Osteoporosis, unspecified osteoporosis type, unspecified pathological fracture presence  General medical exam  Patient's diabetic foot exam is performed today and is significant for thick toenails that the patient is unable to cut herself.  She is requesting that I schedule her to see a podiatrist which I will arrange.  I also recommended a diabetic eye exam.  Begin losartan 50 mg p.o. daily and recheck blood pressure in 1 week.  Start the patient on Anoro 1 inhalation daily and then reassess in 2 weeks to see if her breathing improves.  Check CBC, CMP, fasting lipid panel, urine microalbumin, and hemoglobin A1c.  Patient received her flu shot today.  The remainder of her immunizations are up-to-date.  Recommended a diabetic eye exam.

## 2019-01-15 LAB — COMPLETE METABOLIC PANEL WITH GFR
AG Ratio: 1.8 (calc) (ref 1.0–2.5)
ALT: 14 U/L (ref 6–29)
AST: 14 U/L (ref 10–35)
Albumin: 4.6 g/dL (ref 3.6–5.1)
Alkaline phosphatase (APISO): 65 U/L (ref 37–153)
BUN/Creatinine Ratio: 49 (calc) — ABNORMAL HIGH (ref 6–22)
BUN: 30 mg/dL — ABNORMAL HIGH (ref 7–25)
CO2: 36 mmol/L — ABNORMAL HIGH (ref 20–32)
Calcium: 10.5 mg/dL — ABNORMAL HIGH (ref 8.6–10.4)
Chloride: 100 mmol/L (ref 98–110)
Creat: 0.61 mg/dL (ref 0.60–0.93)
GFR, Est African American: 101 mL/min/{1.73_m2} (ref >=60)
GFR, Est Non African American: 87 mL/min/{1.73_m2} (ref >=60)
Globulin: 2.6 g/dL (calc) (ref 1.9–3.7)
Glucose, Bld: 148 mg/dL — ABNORMAL HIGH (ref 65–99)
Potassium: 4.3 mmol/L (ref 3.5–5.3)
Sodium: 143 mmol/L (ref 135–146)
Total Bilirubin: 0.3 mg/dL (ref 0.2–1.2)
Total Protein: 7.2 g/dL (ref 6.1–8.1)

## 2019-01-15 LAB — LIPID PANEL
Cholesterol: 292 mg/dL — ABNORMAL HIGH (ref <200)
HDL: 53 mg/dL (ref >=50)
LDL Cholesterol (Calc): 205 mg/dL (calc) — ABNORMAL HIGH
Non-HDL Cholesterol (Calc): 239 mg/dL (calc) — ABNORMAL HIGH (ref <130)
Total CHOL/HDL Ratio: 5.5 (calc) — ABNORMAL HIGH (ref <5.0)
Triglycerides: 168 mg/dL — ABNORMAL HIGH (ref <150)

## 2019-01-15 LAB — CBC WITH DIFFERENTIAL/PLATELET
Absolute Monocytes: 863 cells/uL (ref 200–950)
Basophils Absolute: 58 cells/uL (ref 0–200)
Basophils Relative: 0.6 %
Eosinophils Absolute: 223 cells/uL (ref 15–500)
Eosinophils Relative: 2.3 %
HCT: 45.9 % — ABNORMAL HIGH (ref 35.0–45.0)
Hemoglobin: 14.6 g/dL (ref 11.7–15.5)
Lymphs Abs: 1019 cells/uL (ref 850–3900)
MCH: 30.6 pg (ref 27.0–33.0)
MCHC: 31.8 g/dL — ABNORMAL LOW (ref 32.0–36.0)
MCV: 96.2 fL (ref 80.0–100.0)
MPV: 11 fL (ref 7.5–12.5)
Monocytes Relative: 8.9 %
Neutro Abs: 7537 cells/uL (ref 1500–7800)
Neutrophils Relative %: 77.7 %
Platelets: 486 10*3/uL — ABNORMAL HIGH (ref 140–400)
RBC: 4.77 10*6/uL (ref 3.80–5.10)
RDW: 12.1 % (ref 11.0–15.0)
Total Lymphocyte: 10.5 %
WBC: 9.7 10*3/uL (ref 3.8–10.8)

## 2019-01-15 LAB — MICROALBUMIN, URINE: Microalb, Ur: 1.7 mg/dL

## 2019-01-15 LAB — HEMOGLOBIN A1C
Hgb A1c MFr Bld: 6 % of total Hgb — ABNORMAL HIGH (ref <5.7)
Mean Plasma Glucose: 126 (calc)
eAG (mmol/L): 7 (calc)

## 2019-01-20 ENCOUNTER — Telehealth: Payer: Self-pay | Admitting: Family Medicine

## 2019-01-20 MED ORDER — VITAMIN D3 250 MCG (10000 UT) PO CAPS: 10000.0000 [IU] | ORAL_CAPSULE | Freq: Every day | ORAL | Status: AC

## 2019-01-20 MED ORDER — GLIMEPIRIDE 1 MG PO TABS: 1.0000 mg | ORAL_TABLET | Freq: Every day | ORAL | 1 refills | Status: DC

## 2019-01-20 MED ORDER — ROSUVASTATIN CALCIUM 20 MG PO TABS: 20.0000 mg | ORAL_TABLET | Freq: Every day | ORAL | 1 refills | Status: DC

## 2019-01-20 MED ORDER — CENTRUM SILVER 50+WOMEN PO TABS: 1.0000 | ORAL_TABLET | Freq: Every day | ORAL | Status: AC

## 2019-01-20 MED ORDER — ESCITALOPRAM OXALATE 10 MG PO TABS: 10.0000 mg | ORAL_TABLET | Freq: Every day | ORAL | 1 refills | Status: DC

## 2019-01-20 NOTE — Telephone Encounter (Signed)
Janett Billow called back to update med list- pt was not taking Crestor (rx sent in for that)  She is also on some that were not mentioned during ov or was denied as taking by pt and caretaker. She is on Gipmepiride and Lexapro. Caretaker was informed to continue medication as is and will call with any changes.

## 2019-01-20 NOTE — Telephone Encounter (Signed)
Continue and folllow up in 3 months to recheck labs

## 2019-01-23 ENCOUNTER — Other Ambulatory Visit: Payer: Self-pay | Admitting: Family Medicine

## 2019-02-02 ENCOUNTER — Ambulatory Visit (INDEPENDENT_AMBULATORY_CARE_PROVIDER_SITE_OTHER): Payer: Medicare Other | Admitting: Podiatry

## 2019-02-02 ENCOUNTER — Other Ambulatory Visit: Payer: Self-pay

## 2019-02-02 DIAGNOSIS — Z794 Long term (current) use of insulin: Secondary | ICD-10-CM

## 2019-02-02 DIAGNOSIS — M79675 Pain in left toe(s): Secondary | ICD-10-CM

## 2019-02-02 DIAGNOSIS — M79674 Pain in right toe(s): Secondary | ICD-10-CM

## 2019-02-02 DIAGNOSIS — E1140 Type 2 diabetes mellitus with diabetic neuropathy, unspecified: Secondary | ICD-10-CM | POA: Diagnosis not present

## 2019-02-02 DIAGNOSIS — L853 Xerosis cutis: Secondary | ICD-10-CM | POA: Diagnosis not present

## 2019-02-02 DIAGNOSIS — E1351 Other specified diabetes mellitus with diabetic peripheral angiopathy without gangrene: Secondary | ICD-10-CM

## 2019-02-02 DIAGNOSIS — B351 Tinea unguium: Secondary | ICD-10-CM | POA: Diagnosis not present

## 2019-02-03 ENCOUNTER — Telehealth: Payer: Self-pay | Admitting: *Deleted

## 2019-02-03 ENCOUNTER — Encounter: Payer: Self-pay | Admitting: Podiatry

## 2019-02-03 DIAGNOSIS — E1140 Type 2 diabetes mellitus with diabetic neuropathy, unspecified: Secondary | ICD-10-CM

## 2019-02-03 DIAGNOSIS — Z794 Long term (current) use of insulin: Secondary | ICD-10-CM

## 2019-02-03 DIAGNOSIS — E1351 Other specified diabetes mellitus with diabetic peripheral angiopathy without gangrene: Secondary | ICD-10-CM

## 2019-02-03 NOTE — Telephone Encounter (Signed)
-----   Message from Felipa Furnace, DPM sent at 02/03/2019  6:51 AM EDT ----- Reina Fuse,  Would you be able to set up ABIs PVRs for this patient thank you very much  Lennette Bihari

## 2019-02-03 NOTE — Telephone Encounter (Signed)
Faxed orders to [facility]. 

## 2019-02-03 NOTE — Progress Notes (Signed)
  Subjective:  Patient ID: Briana Barr, female    DOB: 10-May-1940,  MRN: [id number]  Chief Complaint  Patient presents with  . Nail Problem    Nail trim 1-5 bilateral  . Callouses    Plantar callous trim  . Diabetes Mellitus    Diabetic foot exam   78 y.o. female returns for the above complaint.  She states that she has continuous pain to all of her toenails.  She has not seen any other previous podiatrist for this pain.  She is here today to have thorough diabetic foot exam.  He also would like to know if he can trim down her nails.  Dates she ambulates with the regular sneakers.  Denies any other acute complaints.  Denies any neuropathic pain.  Objective:  There were no vitals filed for this visit. Podiatric Exam: Vascular: Diminished dorsalis pedis and posterior tibial pulses noted bilaterally. Capillary return is immediate. Temperature gradient is WNL. Skin turgor WNL  Sensorium: Normal Semmes Weinstein monofilament test. Normal tactile sensation bilaterally. Nail Exam: Pt has thick disfigured discolored nails with subungual debris noted bilateral entire nail hallux through fifth toenails Ulcer Exam: There is no evidence of ulcer or pre-ulcerative changes or infection. Orthopedic Exam: Muscle tone and strength are WNL. No limitations in general ROM. No crepitus or effusions noted. HAV  B/L.  Hammer toes 2-5  B/L. Skin: No Porokeratosis. No infection or ulcers  Assessment & Plan:  Patient was evaluated and treated and all questions answered.  Peripheral vascular disease -Given her physical exam findings are diminished pedal pulses.  I determined the patient will benefit from a baseline ABIs PVRs examination. -Mateo Flow will contact her for schedule set up.  Xerosis to bilateral plantar feet -I explained patient the etiology and treatment options as discussed with diabetes.  I explained to the patient that the best treatment option is continue with moisturization.  Onychomycosis  with pain  -Nails palliatively debrided as below. -Educated on self-care  Procedure: Nail Debridement Rationale: pain  Type of Debridement: manual, sharp debridement. Instrumentation: Nail nipper, rotary burr. Number of Nails: 10  Procedures and Treatment: Consent by patient was obtained for treatment procedures. The patient understood the discussion of treatment and procedures well. All questions were answered thoroughly reviewed. Debridement of mycotic and hypertrophic toenails, 1 through 5 bilateral and clearing of subungual debris. No ulceration, no infection noted.  Return Visit-Office Procedure: Patient instructed to return to the office for a follow up visit 3 months for continued evaluation and treatment.  Boneta Lucks, DPM    No follow-ups on file.

## 2019-02-06 ENCOUNTER — Other Ambulatory Visit: Payer: Self-pay | Admitting: Family Medicine

## 2019-02-07 ENCOUNTER — Ambulatory Visit (HOSPITAL_COMMUNITY)
Admission: RE | Admit: 2019-02-07 | Discharge: 2019-02-07 | Disposition: A | Discharge status: Home / Self Care | Payer: Medicare Other | Source: Ambulatory Visit | Attending: Cardiovascular Disease | Admitting: Cardiovascular Disease

## 2019-02-07 ENCOUNTER — Other Ambulatory Visit: Payer: Self-pay

## 2019-02-07 DIAGNOSIS — Z794 Long term (current) use of insulin: Secondary | ICD-10-CM | POA: Diagnosis not present

## 2019-02-07 DIAGNOSIS — E1351 Other specified diabetes mellitus with diabetic peripheral angiopathy without gangrene: Secondary | ICD-10-CM | POA: Diagnosis not present

## 2019-02-07 DIAGNOSIS — E1140 Type 2 diabetes mellitus with diabetic neuropathy, unspecified: Secondary | ICD-10-CM

## 2019-02-08 ENCOUNTER — Other Ambulatory Visit: Payer: Self-pay | Admitting: Family Medicine

## 2019-02-09 ENCOUNTER — Other Ambulatory Visit: Payer: Self-pay | Admitting: Family Medicine

## 2019-02-09 ENCOUNTER — Telehealth: Payer: Self-pay | Admitting: Family Medicine

## 2019-02-09 MED ORDER — HYDROCODONE-ACETAMINOPHEN 5-325 MG PO TABS: 1.0000 | ORAL_TABLET | Freq: Three times a day (TID) | ORAL | 0 refills | Status: DC | PRN

## 2019-02-09 NOTE — Telephone Encounter (Signed)
Pt's son called and states that he would like to get her hydrocodone refilled that they tried stopping it but she is just in too much pain and would like it refilled??? At end of voice mail he stated she needed oxycodone refilled.   We have not given her hydrocodone since 2018 and have never given her oxycodone. ?refill or does she need to be seen?

## 2019-02-09 NOTE — Telephone Encounter (Signed)
She was on hydrocodone due to back pain.  I will refill her hydrocodone

## 2019-02-10 NOTE — Telephone Encounter (Signed)
Pt's son aware of med sent

## 2019-03-01 ENCOUNTER — Other Ambulatory Visit: Payer: Self-pay | Admitting: Family Medicine

## 2019-03-02 MED ORDER — HYDROCODONE-ACETAMINOPHEN 5-325 MG PO TABS: 1.0000 | ORAL_TABLET | Freq: Four times a day (QID) | ORAL | 0 refills | Status: DC

## 2019-03-02 NOTE — Telephone Encounter (Signed)
Pt only received 21 pills (pharm called and verified) on 02/10/2019. Per pharm can only do a 7 days supply of pain medication without a prior authorization. Per Dr. Dennard Schaumann because pt is out will refill medication for the same this time and then pt's son to call prior to needing a refill so that we can do a PA on next refill. Per Son pt is only having to take the medication bid.   Please send refill.  (since she can only get 7 days worth I set up RX for QID #28 pills - if not ok please change)

## 2019-03-03 ENCOUNTER — Other Ambulatory Visit: Payer: Self-pay

## 2019-03-06 ENCOUNTER — Encounter: Payer: Self-pay | Admitting: Family Medicine

## 2019-03-06 ENCOUNTER — Ambulatory Visit (INDEPENDENT_AMBULATORY_CARE_PROVIDER_SITE_OTHER): Payer: Medicare Other | Admitting: Family Medicine

## 2019-03-06 ENCOUNTER — Other Ambulatory Visit: Payer: Self-pay

## 2019-03-06 VITALS — BP 142/78 | HR 90 | Temp 98.3°F | Resp 16 | Ht 64.0 in | Wt 134.0 lb

## 2019-03-06 DIAGNOSIS — H9202 Otalgia, left ear: Secondary | ICD-10-CM

## 2019-03-06 MED ORDER — ALBUTEROL SULFATE HFA 108 (90 BASE) MCG/ACT IN AERS: 2.0000 | INHALATION_SPRAY | Freq: Four times a day (QID) | RESPIRATORY_TRACT | 1 refills | Status: AC | PRN

## 2019-03-06 NOTE — Patient Instructions (Signed)
F/U call if no improvement

## 2019-03-06 NOTE — Progress Notes (Signed)
   Subjective:    Patient ID: Briana Barr, female    DOB: February 03, 1941, 78 y.o.   MRN: [id number]  Patient presents for Facial Edema (x2 days- L side of face swollen, reports pain to L ear and down to throat) Patient here with her daughter-in-law.  They were concerned because she had swelling of her face on Thursday and Friday.  They gave her acetaminophen and an ice pack and I did go down.  She is also expressing some mild sore throat but also points to pain beneath her left ear.  This morning she had a sharp pain in her left ear.  She has not had any fever no sinus drainage no cough or congestion.  She is on 2 L of oxygen.  Her only new medication has been hydrocodone they have been using 1 tablet a day and that is helped with her pain.  Her blood pressure is also improved.  She denies any significant sore throat today states that that has improved and the swelling has gone down.  She is able to eat normally.  Blood sugars have been good - last A1C 6%  Meds reviewed, recent labs reviewed   Review Of Systems:  GEN- denies fatigue, fever, weight loss,weakness, recent illness HEENT- denies eye drainage, change in vision, nasal discharge, CVS- denies chest pain, palpitations RESP- denies SOB, cough, wheeze ABD- denies N/V, change in stools, abd pain GU- denies dysuria, hematuria, dribbling, incontinence Neuro- denies headache, dizziness, syncope, seizure activity       Objective:    BP (!) 142/78   Pulse 90   Temp 98.3 F (36.8 C) (Temporal)   Resp 16   Ht 5\' 4"  (1.626 m)   Wt 134 lb (60.8 kg)   SpO2 94% Comment: 2 L/min via [place]  BMI 23.00 kg/m  GEN- NAD, alert and oriented x3 HEENT- PERRL, EOMI, non injected sclera, pink conjunctiva, MMM, oropharynx clear, TM clear bilaterally no effusion mild wax in bilateral canals nares clear no rhinorrhea no sinus tenderness Neck- Supple, very small shotty tender node left submandibular region parotid nontender no swelling CVS- RRR, no  murmur RESP-CTAB, wearing 2 L of oxygen EXT- No edema Pulses- Radial2+        Assessment & Plan:      Problem List Items Addressed This Visit    None    Visit Diagnoses    Left ear pain    -  Primary   Symptoms overall improved, very small shotty node, expect will resolve on its own , no phargyntitis, no dental abscess, no sinusuitis or OM noted, just monitor for now, if anything changes, pt family  Will contact us       Note: This dictation was prepared with Dragon dictation along with smaller phrase technology. Any transcriptional errors that result from this process are unintentional.

## 2019-03-09 ENCOUNTER — Other Ambulatory Visit: Payer: Self-pay | Admitting: Family Medicine

## 2019-03-29 NOTE — Addendum Note (Signed)
Addended by: Shary Decamp B on: 03/29/2019 03:12 PM   Modules accepted: Orders

## 2019-03-29 NOTE — Telephone Encounter (Signed)
Please send refill RX so I can do a PA for qty.  LRF - 03/02/2019

## 2019-03-30 MED ORDER — HYDROCODONE-ACETAMINOPHEN 5-325 MG PO TABS: 1.0000 | ORAL_TABLET | Freq: Four times a day (QID) | ORAL | 0 refills | Status: DC

## 2019-04-26 ENCOUNTER — Other Ambulatory Visit: Payer: Self-pay | Admitting: Family Medicine

## 2019-05-05 ENCOUNTER — Other Ambulatory Visit: Payer: Self-pay | Admitting: Podiatry

## 2019-05-05 ENCOUNTER — Ambulatory Visit (INDEPENDENT_AMBULATORY_CARE_PROVIDER_SITE_OTHER): Payer: Medicare Other | Admitting: Podiatry

## 2019-05-05 ENCOUNTER — Telehealth: Payer: Self-pay | Admitting: *Deleted

## 2019-05-05 ENCOUNTER — Encounter: Payer: Self-pay | Admitting: Podiatry

## 2019-05-05 ENCOUNTER — Other Ambulatory Visit: Payer: Self-pay

## 2019-05-05 DIAGNOSIS — M79675 Pain in left toe(s): Secondary | ICD-10-CM | POA: Diagnosis not present

## 2019-05-05 DIAGNOSIS — L853 Xerosis cutis: Secondary | ICD-10-CM

## 2019-05-05 DIAGNOSIS — Z794 Long term (current) use of insulin: Secondary | ICD-10-CM | POA: Diagnosis not present

## 2019-05-05 DIAGNOSIS — B351 Tinea unguium: Secondary | ICD-10-CM

## 2019-05-05 DIAGNOSIS — E1140 Type 2 diabetes mellitus with diabetic neuropathy, unspecified: Secondary | ICD-10-CM

## 2019-05-05 DIAGNOSIS — M79674 Pain in right toe(s): Secondary | ICD-10-CM | POA: Diagnosis not present

## 2019-05-05 DIAGNOSIS — E1351 Other specified diabetes mellitus with diabetic peripheral angiopathy without gangrene: Secondary | ICD-10-CM | POA: Diagnosis not present

## 2019-05-05 NOTE — Progress Notes (Signed)
  Subjective:  Patient ID: Briana Barr, female    DOB: 08-25-1940,  MRN: [id number]  Chief Complaint  Patient presents with  . Foot Pain    pt is here for bil rfc. pt states that she is a diabetic type 2, pt also states that she might have poor circulation in both feet.   79 y.o. female returns for the above complaint.  She states that she has continuous pain to all of her toenails.  She has not seen any other previous podiatrist for this pain.  She is here today to have thorough diabetic foot exam.  He also would like to know if he can trim down her nails.  Dates she ambulates with the regular sneakers.  Denies any other acute complaints.  Denies any neuropathic pain.  Objective:  There were no vitals filed for this visit. Podiatric Exam: Vascular: Diminished dorsalis pedis and posterior tibial pulses noted bilaterally. Capillary return is immediate. Temperature gradient is WNL. Skin turgor WNL  Sensorium: Normal Semmes Weinstein monofilament test. Normal tactile sensation bilaterally. Nail Exam: Pt has thick disfigured discolored nails with subungual debris noted bilateral entire nail hallux through fifth toenails Ulcer Exam: There is no evidence of ulcer or pre-ulcerative changes or infection. Orthopedic Exam: Muscle tone and strength are WNL. No limitations in general ROM. No crepitus or effusions noted. HAV  B/L.  Hammer toes 2-5  B/L. Skin: No Porokeratosis. No infection or ulcers  Assessment & Plan:  Patient was evaluated and treated and all questions answered.  Peripheral vascular disease without soft tissue loss -I reviewed the ABIs PVR study that was done by the patient couple of months ago.  It appears that patient has poor vascular flow to the by lateral lower extremity given the waveforms.  I believe patient will benefit from an evaluation by Dr. Damita Dunnings.  I will defer further vascular management to him.  Xerosis to bilateral plantar feet -I explained patient the etiology  and treatment options as discussed with diabetes.  I explained to the patient that the best treatment option is continue with moisturization.  Onychomycosis with pain  -Nails palliatively debrided as below. -Educated on self-care  Procedure: Nail Debridement Rationale: pain  Type of Debridement: manual, sharp debridement. Instrumentation: Nail nipper, rotary burr. Number of Nails: 10  Procedures and Treatment: Consent by patient was obtained for treatment procedures. The patient understood the discussion of treatment and procedures well. All questions were answered thoroughly reviewed. Debridement of mycotic and hypertrophic toenails, 1 through 5 bilateral and clearing of subungual debris. No ulceration, no infection noted.  Return Visit-Office Procedure: Patient instructed to return to the office for a follow up visit 3 months for continued evaluation and treatment.  Boneta Lucks, DPM    No follow-ups on file.

## 2019-05-05 NOTE — Telephone Encounter (Signed)
Faxed referral to [facility], with clinicals and demographics.

## 2019-05-05 NOTE — Telephone Encounter (Signed)
-----   Message from Felipa Furnace, DPM sent at 05/05/2019  2:14 PM EST ----- Regarding: Referral to Dr. Damita Dunnings [facility],  Can you send in a referral for Dr. Damita Dunnings for this patient.  For poor vascular flow to the lower extremity.  Thanks

## 2019-05-12 ENCOUNTER — Other Ambulatory Visit: Payer: Medicare Other

## 2019-05-15 ENCOUNTER — Other Ambulatory Visit: Payer: Self-pay | Admitting: Podiatry

## 2019-05-15 ENCOUNTER — Ambulatory Visit
Admission: RE | Admit: 2019-05-15 | Discharge: 2019-05-15 | Disposition: A | Discharge status: Home / Self Care | Payer: Medicare Other | Source: Ambulatory Visit | Attending: Podiatry | Admitting: Podiatry

## 2019-05-15 DIAGNOSIS — L853 Xerosis cutis: Secondary | ICD-10-CM

## 2019-05-15 DIAGNOSIS — E1140 Type 2 diabetes mellitus with diabetic neuropathy, unspecified: Secondary | ICD-10-CM

## 2019-05-15 DIAGNOSIS — Z794 Long term (current) use of insulin: Secondary | ICD-10-CM

## 2019-05-15 DIAGNOSIS — I70291 Other atherosclerosis of native arteries of extremities, right leg: Secondary | ICD-10-CM | POA: Diagnosis not present

## 2019-05-15 DIAGNOSIS — E1351 Other specified diabetes mellitus with diabetic peripheral angiopathy without gangrene: Secondary | ICD-10-CM

## 2019-05-16 ENCOUNTER — Other Ambulatory Visit: Payer: Self-pay

## 2019-05-16 ENCOUNTER — Ambulatory Visit
Admission: RE | Admit: 2019-05-16 | Discharge: 2019-05-16 | Disposition: A | Discharge status: Home / Self Care | Payer: Medicare Other | Source: Ambulatory Visit | Attending: Podiatry | Admitting: Podiatry

## 2019-05-16 DIAGNOSIS — Z794 Long term (current) use of insulin: Secondary | ICD-10-CM

## 2019-05-16 DIAGNOSIS — E1351 Other specified diabetes mellitus with diabetic peripheral angiopathy without gangrene: Secondary | ICD-10-CM

## 2019-05-16 DIAGNOSIS — L853 Xerosis cutis: Secondary | ICD-10-CM

## 2019-05-16 DIAGNOSIS — E1140 Type 2 diabetes mellitus with diabetic neuropathy, unspecified: Secondary | ICD-10-CM

## 2019-06-06 ENCOUNTER — Encounter: Payer: Self-pay | Admitting: *Deleted

## 2019-06-06 ENCOUNTER — Ambulatory Visit
Admission: RE | Admit: 2019-06-06 | Discharge: 2019-06-06 | Disposition: A | Discharge status: Home / Self Care | Payer: Medicare Other | Source: Ambulatory Visit | Attending: Podiatry | Admitting: Podiatry

## 2019-06-06 ENCOUNTER — Other Ambulatory Visit: Payer: Self-pay

## 2019-06-06 DIAGNOSIS — I739 Peripheral vascular disease, unspecified: Secondary | ICD-10-CM | POA: Diagnosis not present

## 2019-06-06 HISTORY — PX: IR RADIOLOGIST EVAL & MGMT: IMG5224

## 2019-06-06 NOTE — Consult Note (Signed)
Chief Complaint: PAD  Referring Physician(s): Patel,Kevin P  History of Present Illness: Briana Barr is a 79 y.o. female presenting today as a scheduled consultation to [place] [facility], kindly referred by Dr. Posey Pronto, for evaluation of known PAD and candidacy for any possible intervention.   Briana Barr joins [place] today by way of telemedicine visit with WebEx, and her step-daughter Keane Scrape joins [place] on the visit.  Her family provided the majority of the given history.   Briana Barr has been seeing Dr. Posey Pronto for about 6 months now for routine foot care.  This was encouraged by her daughter in law, as she works in the field of mental health, and she knows with her risk factors that she needs care beyond what can be provided at home.    She denies any history of prior wound of her lower extremities, and she does not currently have a wound.    Her days are spent without much activity her daughter in law tells me, as she has a 2L - 3L O2 requirement at baseline.  She denies any symptoms of claudication, but she is not exercising/walking very much.    She denies any history of stroke or MI.  She denies any chest pain.   She tells me she had "a vein taken from her leg and put somewhere" but she cannot provide details.  I suspect this was for left fem-pop bypass, which is partially imaged on CT.   CV risk factors:  HTN, DM, HLD, smoking (ongoing)  She is long time smoker, staring in her teens/20's, and continues to smoke ~1/2 pack per day. She has never really considered quitting from what I can gather.   Her career was labor/industry, working for a Production assistant, radio" with long hours standing/working.   Her PCP is Dr. Dennard Schaumann with [facility].    Non-invasive exam 05/15/19: Right ABI: 0.56, with segmental exam showing likely fem-pop occlusion Left ABI: 0.94  Prior CT shows evidence of prior left fem-pop bypass.   Past Medical History:  Diagnosis Date  . Barrett esophagus   .  Colon polyps    benign  . COPD (chronic obstructive pulmonary disease) ([place])   . Duodenal ulcer   . DVT (deep venous thrombosis) (H[facility])    left leg x 2  . Gallstones   . GERD (gastroesophageal reflux disease)   . Glaucoma   . Hyperlipidemia   . Ischemic colitis ([place])   . Osteoporosis   . Pneumonia    hx of x 2 as a child   . Renal cyst, right   . Retinopathy, background, proliferative     Past Surgical History:  Procedure Laterality Date  . cervical vertebral fusion     c5-6, 6-7  . CHOLECYSTECTOMY    . COLONOSCOPY WITH PROPOFOL N/A 11/23/2016   Procedure: COLONOSCOPY WITH PROPOFOL;  Surgeon: Ladene Artist, MD;  Location: [facility];  Service: Endoscopy;  Laterality: N/A;  . ESOPHAGOGASTRODUODENOSCOPY (EGD) WITH PROPOFOL N/A 11/23/2016   Procedure: ESOPHAGOGASTRODUODENOSCOPY (EGD) WITH PROPOFOL;  Surgeon: Ladene Artist, MD;  Location: [facility];  Service: Endoscopy;  Laterality: N/A;  . FEMORAL-POPLITEAL BYPASS GRAFT      Allergies: Vioxx [rofecoxib]  Medications: Prior to Admission medications   Medication Sig Start Date End Date Taking? Authorizing Provider  acetaZOLAMIDE (DIAMOX) 250 MG tablet TAKE 1 TABLET BY MOUTH EVERY DAY 02/08/19   [name], MD  albuterol [facility] HFA) 108 (90 Base) MCG/ACT inhaler Inhale 2 puffs into  the lungs every 6 (six) hours as needed for wheezing or shortness of breath. 03/06/19   Alycia Rossetti, MD  aspirin 81 MG tablet Take 81 mg by mouth daily.    [provider]  Cholecalciferol (VITAMIN D3) 250 MCG (10000 UT) capsule Take 1 capsule (10,000 Units total) by mouth daily. 01/20/19   [name], MD  escitalopram (LEXAPRO) 10 MG tablet TAKE 1 TABLET BY MOUTH EVERY DAY 02/06/19   [name], MD  glimepiride (AMARYL) 1 MG tablet TAKE 1 TABLET BY MOUTH EVERY DAY IN THE MORNING 04/26/19   [name], MD  HYDROcodone-acetaminophen (NORCO) 5-325 MG tablet Take 1 tablet by mouth 4 (four) times daily.  03/30/19   [name], MD  losartan (COZAAR) 50 MG tablet Take 1 tablet (50 mg total) by mouth daily. 01/12/19   [name], MD  metFORMIN (GLUCOPHAGE) 500 MG tablet TAKE 1 TABLET BY MOUTH TWICE A DAY WITH MEALS 03/13/19   [name], MD  Multiple Vitamins-Minerals (CENTRUM SILVER 50+WOMEN) TABS Take 1 tablet by mouth daily. 01/20/19   [name], MD  omeprazole (PRILOSEC) 20 MG capsule TAKE 1 CAPSULE BY MOUTH TWICE A DAY BEFORE A MEAL 01/23/19   [name], MD  rosuvastatin (CRESTOR) 20 MG tablet Take 1 tablet (20 mg total) by mouth at bedtime. 01/20/19   [name], MD     Family History  Problem Relation Age of Onset  . Other Father        some sort of blood cancer-had to get transfusions  . Stomach cancer Maternal Grandmother   . Stomach cancer Maternal Grandfather     Social History   Socioeconomic History  . Marital status: Widowed    Spouse name: Not on file  . Number of children: 3  . Years of education: Not on file  . Highest education level: Not on file  Occupational History  . Occupation: retired  Tobacco Use  . Smoking status: Current Every Day Smoker    Packs/day: 1.00    Types: Cigarettes  . Smokeless tobacco: Never Used  . Tobacco comment: started age 74  Substance and Sexual Activity  . Alcohol use: No  . Drug use: No  . Sexual activity: Not on file  Other Topics Concern  . Not on file  Social History Narrative  . Not on file   Social Determinants of Health   Financial Resource Strain:   . Difficulty of Paying Living Expenses: Not on file  Food Insecurity:   . Worried About Charity fundraiser in the Last Year: Not on file  . Ran Out of Food in the Last Year: Not on file  Transportation Needs:   . Lack of Transportation (Medical): Not on file  . Lack of Transportation (Non-Medical): Not on file  Physical Activity:   . Days of Exercise per Week: Not on file  . Minutes of Exercise per Session: Not on file    Stress:   . Feeling of Stress : Not on file  Social Connections:   . Frequency of Communication with Friends and Family: Not on file  . Frequency of Social Gatherings with Friends and Family: Not on file  . Attends Religious Services: Not on file  . Active Member of Clubs or Organizations: Not on file  . Attends Archivist Meetings: Not on file  . Marital Status: Not on file       Review of Systems  Review of Systems: A 12 point ROS discussed and pertinent positives are indicated in the HPI above.  All other systems are negative.  Physical Exam No direct physical exam was performed (except for noted visual exam findings with Video Visits).    Vital Signs: There were no vitals taken for this visit.  Imaging: US ARTERIAL SEG MULTIPLE LE (ABI, SEGMENTAL PRESSURES, PVR'S)  Result Date: 05/15/2019 CLINICAL DATA:  79 year old female with a history of peripheral vascular disease EXAM: NONINVASIVE PHYSIOLOGIC VASCULAR STUDY OF BILATERAL LOWER EXTREMITIES TECHNIQUE: Evaluation of both lower extremities was performed at rest, including calculation of ankle-brachial indices, multiple segmental pressure evaluation, segmental Doppler and segmental pulse volume recording. COMPARISON:  None. FINDINGS: Right: Resting ankle brachial index:  0.56 Segmental blood pressure: Upper extremity pressures are symmetric. Appropriate increased to the thigh. Significant drop below knee. Digital pressure measures 58 systolic Doppler: Segmental Doppler demonstrates triphasic femoral artery waveform, with monophasic popliteal artery and tibial artery waveforms. Pulse volume recording: Segmental PVR demonstrates deterioration below knee and at the ankle. Left: Resting ankle brachial index: 0.94 Segmental blood pressure: Upper extremity pressures are symmetric. No significant increased to the thigh. Digital pressure measures 94 systolic Doppler: Segmental Doppler demonstrates monophasic waveforms throughout the  left lower extremity. Pulse volume recording: Segmental PVR demonstrates deterioration at the ankle. Additional: IMPRESSION: Right: Resting ABI in the moderate range arterial occlusive disease with the segmental exam demonstrating femoropopliteal occlusion. Left: Resting ABI in the normal range, though would likely decrease after exercise exam. Segmental exam demonstrates iliac arterial occlusive disease. Signed, Dulcy Fanny. Dellia Nims, RPVI [facility] Radiology Electronically Signed   By: Corrie Mckusick D.O.   On: 05/15/2019 15:23    Labs:  CBC: Recent Labs    06/17/18 2005 01/12/19 1017  WBC 13.7* 9.7  HGB 15.3* 14.6  HCT 51.4* 45.9*  PLT 466* 486*    COAGS: No results for input(s): INR, APTT in the last 8760 hours.  BMP: Recent Labs    06/17/18 2125 06/17/18 2133 01/12/19 1017  NA 138  --  143  K 4.2  --  4.3  CL 100  --  100  CO2 30  --  36*  GLUCOSE 223*  --  148*  BUN 22  --  30*  CALCIUM 9.6  --  10.5*  CREATININE 0.63 0.60 0.61  GFRNONAA >60  --  87  GFRAA >60  --  101    LIVER FUNCTION TESTS: Recent Labs    06/17/18 2125 01/12/19 1017  BILITOT 0.5 0.3  AST 25 14  ALT 26 14  ALKPHOS 54  --   PROT 7.8 7.2  ALBUMIN 4.5  --     TUMOR MARKERS: No results for input(s): AFPTM, CEA, CA199, CHROMGRNA in the last 8760 hours.  Assessment and Plan:  Assessment:  Briana Bastedo is a 79 yo female presenting with known PAD, and without current symptoms of claudication, and without current wound.   Her non-invasive exam shows evidence of right femoral-popliteal disease, likely with occlusion.  She remains, however, without symptoms at this time.   I had a lengthy discussion with her and her family regarding anatomy, pathology/pathophysiology, natural history, and prognosis of PAD/CLI.  Informed consent regarding treatment strategies was performed which would possibly include medical management, surgical strategy, and/or  endovascular options, with risk/benefit discussion.  The indications for treatment supported by updated guidelines1, 2 were discussed.  I did tell her that without a wound or any symptoms of  claudication, we would not treat her at this time.    Regarding medical management, maximal medical therapy for reduction of risk factors is indicated as recommended by updated AHA guidelines1.  This includes anti-platelet medication, tight blood glucose control to a HbA1c < 7, tight blood pressure control, maximum-dose HMG-CoA reductase inhibitor, and smoking cessation.  Smoking cessation was addressed, with strategies including counseling and nicotine replacement.  She did not really express a desire to attempt cessation at this time.   Annual flu vaccination is also recommended, with Class 1 recommendation1.   Plan: - Continue maximal medical treatment. - We will schedule a 1 year surveillance ABI/segmental exam, indicated by her history and diagnosis. - She knows that if she has any changes/worsening in the interval she can call us to discuss.  -Annual flu vaccination is recommended in the setting of known PAD, in the absence of contra-indications.  - I discussed smoking cessation, with such strategies to include patches and/or gum, and other.    ___________________________________________________________________   1Morley Kos MD, et al. 2016 AHA/ACC Guideline on the Management of Patients With Lower Extremity Peripheral Artery Disease: Executive Summary: A Report of the American College of Cardiology/American Heart Association Task Force on Clinical Practice Guidelines. J Am Coll Cardiol. 2017 Mar 21;69(11):1465-1508. doi: 10.1016/j.jacc.2016.11.008.   2 - Norgren L, et al. TASC II Working Group. Inter-society consensus for the management of peripheral arterial disease. Int Tressia Miners. 2007 Jun;26(2):81-157. Review. PubMed PMID: [id number]  3 - Hingorani A, et al. The management of diabetic foot: A  clinical practice guideline by the Society for Vascular Surgery in collaboration with the [place] and the Society  for Vascular Medicine. J Vasc Surg. 2016 Feb;63(2 Suppl):3S-21S. doi: 10.1016/j.jvs.2015.10.003. PubMed PMID: [id number].  4 - Corinna Gab, Saab FA, Luberta Mutter, Grant Ruts, Ewell Poe, Driver VR, [place], Lookstein R, van den Baldemar Lenis, Jaff MR, Guadalupe Dawn, Henao S, AlMahameed A, Katzen B. Digital Subtraction Angiography Prior to an Amputation for Critical Limb Ischemia (CLI): An Expert Recommendation Statement From the CLI Global Society to Optimize Limb Salvage. J Endovasc Ther. 2020 Aug;27(4):540-546. doi: 10.1177/1526602820928590. Epub 2020 May 29. PMID: [id number].    Thank you for this interesting consult.  I greatly enjoyed meeting Tiawanna Carriger and look forward to participating in their care.  A copy of this report was sent to the requesting provider on this date.  Electronically Signed: Corrie Mckusick 06/06/2019, 1:47 PM   I spent a total of  40 Minutes   in remote  clinical consultation, greater than 50% of which was counseling/coordinating care for PAD.    Visit type: Audio and video (Webex).   Alternative for in-person consultation at [facility], [address], [place], [place]. This visit type was conducted due to national recommendations for restrictions regarding the COVID-19 Pandemic (e.g. social distancing).  This format is felt to be most appropriate for this patient at this time.  All issues noted in this document were discussed and addressed.

## 2019-06-17 ENCOUNTER — Other Ambulatory Visit: Payer: Self-pay | Admitting: Family Medicine

## 2019-07-06 ENCOUNTER — Other Ambulatory Visit: Payer: Self-pay | Admitting: Family Medicine

## 2019-07-06 NOTE — Telephone Encounter (Signed)
Patient is requesting a refill on Hydrocodone   LOV: 03/06/19  LRF:  03/30/19

## 2019-07-07 MED ORDER — HYDROCODONE-ACETAMINOPHEN 5-325 MG PO TABS: 1.0000 | ORAL_TABLET | Freq: Four times a day (QID) | ORAL | 0 refills | Status: DC

## 2019-07-12 ENCOUNTER — Other Ambulatory Visit: Payer: Self-pay | Admitting: Family Medicine

## 2019-08-04 ENCOUNTER — Ambulatory Visit: Payer: Medicare Other | Admitting: Podiatry

## 2019-08-11 ENCOUNTER — Other Ambulatory Visit: Payer: Self-pay | Admitting: Family Medicine

## 2019-09-12 ENCOUNTER — Other Ambulatory Visit: Payer: Self-pay | Admitting: Family Medicine

## 2019-09-12 MED ORDER — HYDROCODONE-ACETAMINOPHEN 5-325 MG PO TABS: 1.0000 | ORAL_TABLET | Freq: Four times a day (QID) | ORAL | 0 refills | Status: DC

## 2019-09-12 NOTE — Telephone Encounter (Signed)
Last refilled: 07/07/2019 Last office visit: 03/06/2019

## 2019-09-19 ENCOUNTER — Telehealth: Payer: Self-pay | Admitting: Family Medicine

## 2019-09-19 NOTE — Telephone Encounter (Signed)
Pt daughter in law called They came to check on her, she was have SOB, confusion, oxygen sat was 80% They increased oxygen, cant get above 90% Advised to call EMS to evaluate her and take to ER She agrees to do so

## 2019-09-28 ENCOUNTER — Other Ambulatory Visit: Payer: Self-pay

## 2019-09-28 ENCOUNTER — Ambulatory Visit (INDEPENDENT_AMBULATORY_CARE_PROVIDER_SITE_OTHER): Payer: Medicare Other | Admitting: Family Medicine

## 2019-09-28 VITALS — BP 120/62 | HR 87 | Temp 97.1°F | Ht 64.0 in | Wt 146.0 lb

## 2019-09-28 DIAGNOSIS — F0150 Vascular dementia without behavioral disturbance: Secondary | ICD-10-CM | POA: Diagnosis not present

## 2019-09-28 DIAGNOSIS — J9611 Chronic respiratory failure with hypoxia: Secondary | ICD-10-CM | POA: Diagnosis not present

## 2019-09-28 DIAGNOSIS — J441 Chronic obstructive pulmonary disease with (acute) exacerbation: Secondary | ICD-10-CM

## 2019-09-28 MED ORDER — METHYLPREDNISOLONE ACETATE 40 MG/ML IJ SUSP
40.0000 mg | Freq: Once | INTRAMUSCULAR | Status: AC
  Administered 2019-09-28: 40 mg via INTRAMUSCULAR

## 2019-09-28 MED ORDER — IPRATROPIUM-ALBUTEROL 0.5-2.5 (3) MG/3ML IN SOLN: 3.0000 mL | Freq: Four times a day (QID) | RESPIRATORY_TRACT | 1 refills | Status: DC | PRN

## 2019-09-28 MED ORDER — PREDNISONE 20 MG PO TABS: ORAL_TABLET | ORAL | 0 refills | Status: DC

## 2019-09-28 NOTE — Progress Notes (Signed)
Subjective:    Patient ID: Briana Barr, female    DOB: Feb 11, 1941, 79 y.o.   MRN: [id number]  HPI Patient presents today with her daughter-in-law.  Recently, she was confused at home and they checked her oxygen saturations and despite being on 2 L she was found to be in the 70s.  EMS was contacted and she received an albuterol nebulizer treatment and steroids.  They were able to get her oxygen up to the 90s on 2 L however the family refused to go to the hospital per their report.  They present today.  The patient is in no respiratory distress today.  She is 90% on 4 L.  There is no increased work of breathing.  She appears comfortable.  However she has markedly diminished breath sounds bilaterally with faint expiratory wheezing and very little air movement.  They report increased coughing.  She has not been taking any of her inhalers.  She has not been using her albuterol.  Patient lives independently at home and the family comes by once or twice a day to check on her.  I performed a Mini-Mental status exam.  Patient is barely able to tell me the year.  She is unable to tell me the month although she can tell me the day of the week.  She is unable to remember any of 3 objects on recall.  She cannot perform serial sevens although she can spell world in reverse but clearly the patient is having a problem with short-term memory and therefore I am concerned that she is not taking her medication properly. Past Medical History:  Diagnosis Date  . Barrett esophagus   . Colon polyps    benign  . COPD (chronic obstructive pulmonary disease) ([place])   . Duodenal ulcer   . DVT (deep venous thrombosis) (H[facility])    left leg x 2  . Gallstones   . GERD (gastroesophageal reflux disease)   . Glaucoma   . Hyperlipidemia   . Ischemic colitis ([place])   . Osteoporosis   . Pneumonia    hx of x 2 as a child   . Renal cyst, right   . Retinopathy, background, proliferative    Past Surgical History:  Procedure  Laterality Date  . cervical vertebral fusion     c5-6, 6-7  . CHOLECYSTECTOMY    . COLONOSCOPY WITH PROPOFOL N/A 11/23/2016   Procedure: COLONOSCOPY WITH PROPOFOL;  Surgeon: Ladene Artist, MD;  [facility];  Service: Endoscopy;  Laterality: N/A;  . ESOPHAGOGASTRODUODENOSCOPY (EGD) WITH PROPOFOL N/A 11/23/2016   Procedure: ESOPHAGOGASTRODUODENOSCOPY (EGD) WITH PROPOFOL;  Surgeon: Ladene Artist, MD;  [facility];  Service: Endoscopy;  Laterality: N/A;  . FEMORAL-POPLITEAL BYPASS GRAFT    . IR RADIOLOGIST EVAL & MGMT  06/06/2019    Current Outpatient Medications on File Prior to Visit  Medication Sig Dispense Refill  . acetaZOLAMIDE (DIAMOX) 250 MG tablet TAKE 1 TABLET BY MOUTH EVERY DAY 90 tablet 1  . albuterol (PROAIR HFA) 108 (90 Base) MCG/ACT inhaler Inhale 2 puffs into the lungs every 6 (six) hours as needed for wheezing or shortness of breath. 18 g 1  . aspirin 81 MG tablet Take 81 mg by mouth daily.    . Cholecalciferol (VITAMIN D3) 250 MCG (10000 UT) capsule Take 1 capsule (10,000 Units total) by mouth daily. 30 capsule   . escitalopram (LEXAPRO) 10 MG tablet TAKE 1 TABLET BY MOUTH EVERY DAY 90 tablet 3  . glimepiride (AMARYL) 1  MG tablet TAKE 1 TABLET BY MOUTH EVERY DAY IN THE MORNING 90 tablet 1  . HYDROcodone-acetaminophen (NORCO) 5-325 MG tablet Take 1 tablet by mouth 4 (four) times daily. 60 tablet 0  . losartan (COZAAR) 50 MG tablet Take 1 tablet (50 mg total) by mouth daily. 90 tablet 3  . metFORMIN (GLUCOPHAGE) 500 MG tablet TAKE 1 TABLET BY MOUTH TWICE A DAY WITH MEALS 180 tablet 1  . Multiple Vitamins-Minerals (CENTRUM SILVER 50+WOMEN) TABS Take 1 tablet by mouth daily. 90 tablet   . omeprazole (PRILOSEC) 20 MG capsule TAKE 1 CAPSULE BY MOUTH TWICE A DAY BEFORE A MEAL 180 capsule 3  . rosuvastatin (CRESTOR) 20 MG tablet TAKE 1 TABLET BY MOUTH EVERYDAY AT BEDTIME 90 tablet 1   No current facility-administered medications on file prior to visit.    Allergies  Allergen Reactions  . Vioxx [Rofecoxib] Swelling   Social History   Socioeconomic History  . Marital status: Widowed    Spouse name: Not on file  . Number of children: 3  . Years of education: Not on file  . Highest education level: Not on file  Occupational History  . Occupation: retired  Tobacco Use  . Smoking status: Current Every Day Smoker    Packs/day: 1.00    Types: Cigarettes  . Smokeless tobacco: Never Used  . Tobacco comment: started age 41  Vaping Use  . Vaping Use: Never used  Substance and Sexual Activity  . Alcohol use: No  . Drug use: No  . Sexual activity: Not on file  Other Topics Concern  . Not on file  Social History Narrative  . Not on file   Social Determinants of Health   Financial Resource Strain:   . Difficulty of Paying Living Expenses:   Food Insecurity:   . Worried About Charity fundraiser in the Last Year:   . Arboriculturist in the Last Year:   Transportation Needs:   . Film/video editor (Medical):   Marland Kitchen Lack of Transportation (Non-Medical):   Physical Activity:   . Days of Exercise per Week:   . Minutes of Exercise per Session:   Stress:   . Feeling of Stress :   Social Connections:   . Frequency of Communication with Friends and Family:   . Frequency of Social Gatherings with Friends and Family:   . Attends Religious Services:   . Active Member of Clubs or Organizations:   . Attends Archivist Meetings:   Marland Kitchen Marital Status:   Intimate Partner Violence:   . Fear of Current or Ex-Partner:   . Emotionally Abused:   Marland Kitchen Physically Abused:   . Sexually Abused:    Family History  Problem Relation Age of Onset  . Other Father        some sort of blood cancer-had to get transfusions  . Stomach cancer Maternal Grandmother   . Stomach cancer Maternal Grandfather       Review of Systems  All other systems reviewed and are negative.      Objective:   Physical Exam Vitals reviewed.   Constitutional:      General: She is not in acute distress.    Appearance: She is well-developed. She is not diaphoretic.  HENT:     Head: Normocephalic and atraumatic.     Right Ear: External ear normal.     Left Ear: External ear normal.     Nose: Nose normal.     Mouth/Throat:  Pharynx: No oropharyngeal exudate.  Eyes:     General: No scleral icterus.       Right eye: No discharge.        Left eye: No discharge.     Conjunctiva/sclera: Conjunctivae normal.  Neck:     Thyroid: No thyromegaly.     Vascular: No JVD.  Cardiovascular:     Rate and Rhythm: Normal rate and regular rhythm.     Heart sounds: Normal heart sounds. No murmur heard.  No friction rub. No gallop.   Pulmonary:     Effort: Pulmonary effort is normal. No respiratory distress.     Breath sounds: Decreased air movement present. Decreased breath sounds and wheezing present. No rhonchi.  Chest:     Chest wall: No tenderness.  Abdominal:     General: Bowel sounds are normal. There is no distension.     Palpations: Abdomen is soft. There is no mass.     Tenderness: There is no abdominal tenderness. There is no guarding or rebound.  Musculoskeletal:     Cervical back: Neck supple.  Lymphadenopathy:     Cervical: No cervical adenopathy.  Skin:    General: Skin is warm.     Coloration: Skin is not pale.     Findings: No erythema or rash.  Neurological:     Mental Status: She is alert and oriented to person, place, and time.     Cranial Nerves: No cranial nerve deficit.     Motor: No abnormal muscle tone.     Coordination: Coordination normal.     Deep Tendon Reflexes: Reflexes are normal and symmetric.  Psychiatric:        Behavior: Behavior normal.        Thought Content: Thought content normal.        Judgment: Judgment normal.           Assessment & Plan:  COPD exacerbation ([place]) - Plan: ipratropium-albuterol (DUONEB) 0.5-2.5 (3) MG/3ML SOLN, predniSONE (DELTASONE) 20 MG tablet,  methylPREDNISolone acetate (DEPO-MEDROL) injection 40 mg  Chronic respiratory failure with hypoxia (H[facility])  Vascular dementia without behavioral disturbance ([place])  Patient is having a COPD exacerbation and worsening hypoxia.  We discussed going to the hospital however the patient appears comfortable today and is in no apparent distress.  Therefore we will try to treat the patient as an outpatient.  She received 60 mg of Depo-Medrol IM x1 here.  Begin a prednisone taper pack at home starting tomorrow.  Begin duo nebs 2.5/0.5 every 6 hours over the weekend and then recheck here on Monday.  At that point I hope to transition the patient to Trelegy daily to better manage her COPD and used DuoNebs as needed.  Continue oxygen at 4 L temporarily.  Patient has worsening dementia and requires 24/7 supervision.  This was told to the family.  They will need to administer her medication in the future to avoid unforeseen errors.  We will need fasting lab work when she comes back however we need to stabilize her pulmonary status first

## 2019-10-02 ENCOUNTER — Ambulatory Visit: Payer: Medicare Other | Admitting: Family Medicine

## 2019-10-02 ENCOUNTER — Telehealth: Payer: Self-pay | Admitting: Family Medicine

## 2019-10-02 NOTE — Telephone Encounter (Signed)
Decrease duoneb  from qid to prn

## 2019-10-02 NOTE — Telephone Encounter (Signed)
CB# [phone] Briana Barr would like to know what can she do until mothers appt for her shaking due takin  Albuterol

## 2019-10-02 NOTE — Progress Notes (Signed)
°  Chronic Care Management   Outreach Note  10/02/2019 Name: Briana Barr MRN: [id number] DOB: 1940/08/18  Referred by: [name], MD Reason for referral : Chronic Care Management   An unsuccessful telephone outreach was attempted today. The patient was referred to the pharmacist for assistance with care management and care coordination.   Follow Up Plan:   [place]

## 2019-10-02 NOTE — Telephone Encounter (Signed)
Spoke with Pt daughter she verbalized understanding of instructions from Dr. Dennard Schaumann.

## 2019-10-04 ENCOUNTER — Ambulatory Visit: Payer: Medicare Other | Admitting: Podiatry

## 2019-10-05 ENCOUNTER — Ambulatory Visit (INDEPENDENT_AMBULATORY_CARE_PROVIDER_SITE_OTHER): Payer: Medicare Other | Admitting: Family Medicine

## 2019-10-05 ENCOUNTER — Other Ambulatory Visit: Payer: Self-pay

## 2019-10-05 VITALS — Temp 97.9°F | Ht 64.0 in | Wt 141.0 lb

## 2019-10-05 DIAGNOSIS — F0150 Vascular dementia without behavioral disturbance: Secondary | ICD-10-CM

## 2019-10-05 DIAGNOSIS — J9611 Chronic respiratory failure with hypoxia: Secondary | ICD-10-CM | POA: Diagnosis not present

## 2019-10-05 DIAGNOSIS — J441 Chronic obstructive pulmonary disease with (acute) exacerbation: Secondary | ICD-10-CM

## 2019-10-05 MED ORDER — TRELEGY ELLIPTA 100-62.5-25 MCG/INH IN AEPB: 1.0000 | INHALATION_SPRAY | Freq: Every day | RESPIRATORY_TRACT | 11 refills | Status: DC

## 2019-10-05 NOTE — Progress Notes (Signed)
Subjective:    Patient ID: Briana Barr, female    DOB: 1941-04-11, 79 y.o.   MRN: [id number]  HPI  09/28/19 Patient presents today with her daughter-in-law.  Recently, she was confused at home and they checked her oxygen saturations and despite being on 2 L she was found to be in the 70s.  EMS was contacted and she received an albuterol nebulizer treatment and steroids.  They were able to get her oxygen up to the 90s on 2 L however the family refused to go to the hospital per their report.  They present today.  The patient is in no respiratory distress today.  She is 90% on 4 L.  There is no increased work of breathing.  She appears comfortable.  However she has markedly diminished breath sounds bilaterally with faint expiratory wheezing and very little air movement.  They report increased coughing.  She has not been taking any of her inhalers.  She has not been using her albuterol.  Patient lives independently at home and the family comes by once or twice a day to check on her.  I performed a Mini-Mental status exam.  Patient is barely able to tell me the year.  She is unable to tell me the month although she can tell me the day of the week.  She is unable to remember any of 3 objects on recall.  She cannot perform serial sevens although she can spell world in reverse but clearly the patient is having a problem with short-term memory and therefore I am concerned that she is not taking her medication properly.  At that time, my plan was: Patient is having a COPD exacerbation and worsening hypoxia.  We discussed going to the hospital however the patient appears comfortable today and is in no apparent distress.  Therefore we will try to treat the patient as an outpatient.  She received 60 mg of Depo-Medrol IM x1 here.  Begin a prednisone taper pack at home starting tomorrow.  Begin duo nebs 2.5/0.5 every 6 hours over the weekend and then recheck here on Monday.  At that point I hope to transition the patient  to Trelegy daily to better manage her COPD and used DuoNebs as needed.  Continue oxygen at 4 L temporarily.  Patient has worsening dementia and requires 24/7 supervision.  This was told to the family.  They will need to administer her medication in the future to avoid unforeseen errors.  We will need fasting lab work when she comes back however we need to stabilize her pulmonary status first  10/05/19 Patient is here today with her daughter-in-law.  Her breathing has improved.  Pulse oximetry is between 91 and 95% at home whereas last week it was in the 80s.  She has not seen a significant benefit although she is not labored in her breathing.  She is less confused and more awake and alert.  She was able to wean down on the DuoNebs and is now using them only as needed.  She has completed the prednisone.  Family states that she was more delirious on prednisone but has improved since discontinuing it. Past Medical History:  Diagnosis Date  . Barrett esophagus   . Colon polyps    benign  . COPD (chronic obstructive pulmonary disease) ([place])   . Duodenal ulcer   . DVT (deep venous thrombosis) ([facility])    left leg x 2  . Gallstones   . GERD (gastroesophageal reflux disease)   .  Glaucoma   . Hyperlipidemia   . Ischemic colitis ([place])   . Osteoporosis   . Pneumonia    hx of x 2 as a child   . Renal cyst, right   . Retinopathy, background, proliferative    Past Surgical History:  Procedure Laterality Date  . cervical vertebral fusion     c5-6, 6-7  . CHOLECYSTECTOMY    . COLONOSCOPY WITH PROPOFOL N/A 11/23/2016   Procedure: COLONOSCOPY WITH PROPOFOL;  Surgeon: Ladene Artist, MD;  Location: [facility];  Service: Endoscopy;  Laterality: N/A;  . ESOPHAGOGASTRODUODENOSCOPY (EGD) WITH PROPOFOL N/A 11/23/2016   Procedure: ESOPHAGOGASTRODUODENOSCOPY (EGD) WITH PROPOFOL;  Surgeon: Ladene Artist, MD;  Location: [facility];  Service: Endoscopy;  Laterality: N/A;  . FEMORAL-POPLITEAL BYPASS GRAFT      . IR RADIOLOGIST EVAL & MGMT  06/06/2019    Current Outpatient Medications on File Prior to Visit  Medication Sig Dispense Refill  . acetaZOLAMIDE (DIAMOX) 250 MG tablet TAKE 1 TABLET BY MOUTH EVERY DAY 90 tablet 1  . albuterol (PROAIR HFA) 108 (90 Base) MCG/ACT inhaler Inhale 2 puffs into the lungs every 6 (six) hours as needed for wheezing or shortness of breath. 18 g 1  . aspirin 81 MG tablet Take 81 mg by mouth daily.    . Cholecalciferol (VITAMIN D3) 250 MCG (10000 UT) capsule Take 1 capsule (10,000 Units total) by mouth daily. 30 capsule   . escitalopram (LEXAPRO) 10 MG tablet TAKE 1 TABLET BY MOUTH EVERY DAY 90 tablet 3  . glimepiride (AMARYL) 1 MG tablet TAKE 1 TABLET BY MOUTH EVERY DAY IN THE MORNING 90 tablet 1  . HYDROcodone-acetaminophen (NORCO) 5-325 MG tablet Take 1 tablet by mouth 4 (four) times daily. 60 tablet 0  . ipratropium-albuterol (DUONEB) 0.5-2.5 (3) MG/3ML SOLN Take 3 mLs by nebulization every 6 (six) hours as needed. 360 mL 1  . losartan (COZAAR) 50 MG tablet Take 1 tablet (50 mg total) by mouth daily. 90 tablet 3  . metFORMIN (GLUCOPHAGE) 500 MG tablet TAKE 1 TABLET BY MOUTH TWICE A DAY WITH MEALS 180 tablet 1  . Multiple Vitamins-Minerals (CENTRUM SILVER 50+WOMEN) TABS Take 1 tablet by mouth daily. 90 tablet   . omeprazole (PRILOSEC) 20 MG capsule TAKE 1 CAPSULE BY MOUTH TWICE A DAY BEFORE A MEAL 180 capsule 3  . rosuvastatin (CRESTOR) 20 MG tablet TAKE 1 TABLET BY MOUTH EVERYDAY AT BEDTIME 90 tablet 1   No current facility-administered medications on file prior to visit.   Allergies  Allergen Reactions  . Vioxx [Rofecoxib] Swelling   Social History   Socioeconomic History  . Marital status: Widowed    Spouse name: Not on file  . Number of children: 3  . Years of education: Not on file  . Highest education level: Not on file  Occupational History  . Occupation: retired  Tobacco Use  . Smoking status: Current Every Day Smoker    Packs/day: 1.00     Types: Cigarettes  . Smokeless tobacco: Never Used  . Tobacco comment: started age 58  Vaping Use  . Vaping Use: Never used  Substance and Sexual Activity  . Alcohol use: No  . Drug use: No  . Sexual activity: Not on file  Other Topics Concern  . Not on file  Social History Narrative  . Not on file   Social Determinants of Health   Financial Resource Strain:   . Difficulty of Paying Living Expenses:   Food Insecurity:   .  Worried About Charity fundraiser in the Last Year:   . Arboriculturist in the Last Year:   Transportation Needs:   . Film/video editor (Medical):   Marland Kitchen Lack of Transportation (Non-Medical):   Physical Activity:   . Days of Exercise per Week:   . Minutes of Exercise per Session:   Stress:   . Feeling of Stress :   Social Connections:   . Frequency of Communication with Friends and Family:   . Frequency of Social Gatherings with Friends and Family:   . Attends Religious Services:   . Active Member of Clubs or Organizations:   . Attends Archivist Meetings:   Marland Kitchen Marital Status:   Intimate Partner Violence:   . Fear of Current or Ex-Partner:   . Emotionally Abused:   Marland Kitchen Physically Abused:   . Sexually Abused:    Family History  Problem Relation Age of Onset  . Other Father        some sort of blood cancer-had to get transfusions  . Stomach cancer Maternal Grandmother   . Stomach cancer Maternal Grandfather       Review of Systems  All other systems reviewed and are negative.      Objective:   Physical Exam Vitals reviewed.  Constitutional:      General: She is not in acute distress.    Appearance: She is well-developed. She is not diaphoretic.  HENT:     Head: Normocephalic and atraumatic.     Right Ear: External ear normal.     Left Ear: External ear normal.     Nose: Nose normal.     Mouth/Throat:     Pharynx: No oropharyngeal exudate.  Eyes:     General: No scleral icterus.       Right eye: No discharge.        Left  eye: No discharge.     Conjunctiva/sclera: Conjunctivae normal.  Neck:     Thyroid: No thyromegaly.     Vascular: No JVD.  Cardiovascular:     Rate and Rhythm: Normal rate and regular rhythm.     Heart sounds: Normal heart sounds. No murmur heard.  No friction rub. No gallop.   Pulmonary:     Effort: Pulmonary effort is normal. No respiratory distress.     Breath sounds: Decreased air movement present. Decreased breath sounds and wheezing present. No rhonchi.  Chest:     Chest wall: No tenderness.  Abdominal:     General: Bowel sounds are normal. There is no distension.     Palpations: Abdomen is soft. There is no mass.     Tenderness: There is no abdominal tenderness. There is no guarding or rebound.  Musculoskeletal:     Cervical back: Neck supple.  Lymphadenopathy:     Cervical: No cervical adenopathy.  Skin:    General: Skin is warm.     Coloration: Skin is not pale.     Findings: No erythema or rash.  Neurological:     Mental Status: She is alert and oriented to person, place, and time.     Cranial Nerves: No cranial nerve deficit.     Motor: No abnormal muscle tone.     Coordination: Coordination normal.     Deep Tendon Reflexes: Reflexes are normal and symmetric.  Psychiatric:        Behavior: Behavior normal.        Thought Content: Thought content normal.  Judgment: Judgment normal.           Assessment & Plan:  COPD exacerbation ([place])  Chronic respiratory failure with hypoxia ([facility])  Vascular dementia without behavioral disturbance ([place])  Spent more than 30 minutes today with the patient and her family.  I have recommended that she get a healthcare and legal power of attorney.  I am concerned that her vascular dementia is progressing.  I believe it would be in her benefit to start making long-term planning with her family.  Her 3 sons will be in town for 4 July and I have recommended that they have this discussion.  I also recommend that she have  supervision.  I am concerned that the patient stopped taking her medication due to her dementia and this may have triggered her COPD exacerbation.  Therefore I want to discontinue any mind altering substances such as pain medication.  Also on to simplify her regimen for COPD and start the patient on Trelegy 1 inhalation a day therefore the family could administer the dose to ensure that she has had the medication.  I also strongly recommended smoking cessation as this will likely only expedite the deterioration of her condition.  We also discussed DNR/DNI status.  Patient has chronic respiratory failure with hypoxia.  Given continued smoking she will likely be hospitalized in the near future.  Therefore I believe that she needs to have this discussion with her family regarding her long-term wishes.  Family would like to discuss this at home before making any decisions. Consult home health for help at home.

## 2019-10-09 ENCOUNTER — Telehealth: Payer: Self-pay | Admitting: Family Medicine

## 2019-10-09 NOTE — Progress Notes (Signed)
  Chronic Care Management   Note  10/09/2019 Name: Briana Barr MRN: [id number] DOB: 1940/05/22  Briana Barr is a 78 y.o. year old female who is a primary care patient of [name], MD. I reached out to [name] by phone today in response to a referral sent by [name]'s PCP, [name], MD.   [name] was given information about Chronic Care Management services today including:  1. CCM service includes personalized support from designated clinical staff supervised by her physician, including individualized plan of care and coordination with other care providers 2. 24/7 contact phone numbers for assistance for urgent and routine care needs. 3. Service will only be billed when office clinical staff spend 20 minutes or more in a month to coordinate care. 4. Only one practitioner may furnish and bill the service in a calendar month. 5. The patient may stop CCM services at any time (effective at the end of the month) by phone call to the office staff.   Patient agreed to services and verbal consent obtained.   Follow up plan:   [place]

## 2019-10-13 ENCOUNTER — Ambulatory Visit: Payer: Medicare Other | Admitting: Podiatry

## 2019-10-22 DIAGNOSIS — I82402 Acute embolism and thrombosis of unspecified deep veins of left lower extremity: Secondary | ICD-10-CM | POA: Diagnosis not present

## 2019-10-22 DIAGNOSIS — F17210 Nicotine dependence, cigarettes, uncomplicated: Secondary | ICD-10-CM | POA: Diagnosis not present

## 2019-10-22 DIAGNOSIS — E113599 Type 2 diabetes mellitus with proliferative diabetic retinopathy without macular edema, unspecified eye: Secondary | ICD-10-CM | POA: Diagnosis not present

## 2019-10-22 DIAGNOSIS — F0150 Vascular dementia without behavioral disturbance: Secondary | ICD-10-CM | POA: Diagnosis not present

## 2019-10-22 DIAGNOSIS — K2270 Barrett's esophagus without dysplasia: Secondary | ICD-10-CM | POA: Diagnosis not present

## 2019-10-22 DIAGNOSIS — K559 Vascular disorder of intestine, unspecified: Secondary | ICD-10-CM | POA: Diagnosis not present

## 2019-10-22 DIAGNOSIS — M810 Age-related osteoporosis without current pathological fracture: Secondary | ICD-10-CM | POA: Diagnosis not present

## 2019-10-22 DIAGNOSIS — N281 Cyst of kidney, acquired: Secondary | ICD-10-CM | POA: Diagnosis not present

## 2019-10-22 DIAGNOSIS — J441 Chronic obstructive pulmonary disease with (acute) exacerbation: Secondary | ICD-10-CM | POA: Diagnosis not present

## 2019-10-22 DIAGNOSIS — K8020 Calculus of gallbladder without cholecystitis without obstruction: Secondary | ICD-10-CM | POA: Diagnosis not present

## 2019-10-22 DIAGNOSIS — Z9981 Dependence on supplemental oxygen: Secondary | ICD-10-CM | POA: Diagnosis not present

## 2019-10-22 DIAGNOSIS — K635 Polyp of colon: Secondary | ICD-10-CM | POA: Diagnosis not present

## 2019-10-22 DIAGNOSIS — Z95820 Peripheral vascular angioplasty status with implants and grafts: Secondary | ICD-10-CM | POA: Diagnosis not present

## 2019-10-22 DIAGNOSIS — E785 Hyperlipidemia, unspecified: Secondary | ICD-10-CM | POA: Diagnosis not present

## 2019-10-22 DIAGNOSIS — Z7984 Long term (current) use of oral hypoglycemic drugs: Secondary | ICD-10-CM | POA: Diagnosis not present

## 2019-10-22 DIAGNOSIS — H409 Unspecified glaucoma: Secondary | ICD-10-CM | POA: Diagnosis not present

## 2019-10-22 DIAGNOSIS — J9611 Chronic respiratory failure with hypoxia: Secondary | ICD-10-CM | POA: Diagnosis not present

## 2019-10-24 ENCOUNTER — Telehealth: Payer: Self-pay

## 2019-10-24 NOTE — Telephone Encounter (Signed)
Verbal orders for COPD management. Home Health Nurse for bathing

## 2019-10-24 NOTE — Telephone Encounter (Signed)
Ok to order 

## 2019-10-25 ENCOUNTER — Other Ambulatory Visit: Payer: Self-pay | Admitting: Family Medicine

## 2019-11-01 DIAGNOSIS — I82402 Acute embolism and thrombosis of unspecified deep veins of left lower extremity: Secondary | ICD-10-CM | POA: Diagnosis not present

## 2019-11-01 DIAGNOSIS — J441 Chronic obstructive pulmonary disease with (acute) exacerbation: Secondary | ICD-10-CM | POA: Diagnosis not present

## 2019-11-01 DIAGNOSIS — J9611 Chronic respiratory failure with hypoxia: Secondary | ICD-10-CM | POA: Diagnosis not present

## 2019-11-01 DIAGNOSIS — F0150 Vascular dementia without behavioral disturbance: Secondary | ICD-10-CM | POA: Diagnosis not present

## 2019-11-01 DIAGNOSIS — E785 Hyperlipidemia, unspecified: Secondary | ICD-10-CM | POA: Diagnosis not present

## 2019-11-01 DIAGNOSIS — M810 Age-related osteoporosis without current pathological fracture: Secondary | ICD-10-CM | POA: Diagnosis not present

## 2019-11-06 DIAGNOSIS — I82402 Acute embolism and thrombosis of unspecified deep veins of left lower extremity: Secondary | ICD-10-CM | POA: Diagnosis not present

## 2019-11-06 DIAGNOSIS — M810 Age-related osteoporosis without current pathological fracture: Secondary | ICD-10-CM | POA: Diagnosis not present

## 2019-11-06 DIAGNOSIS — J9611 Chronic respiratory failure with hypoxia: Secondary | ICD-10-CM | POA: Diagnosis not present

## 2019-11-06 DIAGNOSIS — J441 Chronic obstructive pulmonary disease with (acute) exacerbation: Secondary | ICD-10-CM | POA: Diagnosis not present

## 2019-11-06 DIAGNOSIS — F0150 Vascular dementia without behavioral disturbance: Secondary | ICD-10-CM | POA: Diagnosis not present

## 2019-11-06 DIAGNOSIS — E785 Hyperlipidemia, unspecified: Secondary | ICD-10-CM | POA: Diagnosis not present

## 2019-11-12 ENCOUNTER — Emergency Department (HOSPITAL_COMMUNITY): Payer: Medicare Other

## 2019-11-12 ENCOUNTER — Other Ambulatory Visit: Payer: Self-pay

## 2019-11-12 ENCOUNTER — Encounter (HOSPITAL_COMMUNITY): Payer: Self-pay | Admitting: Student

## 2019-11-12 ENCOUNTER — Inpatient Hospital Stay (HOSPITAL_COMMUNITY)
Admission: EM | Admit: 2019-11-12 | Discharge: 2019-11-20 | DRG: 193 | Disposition: A | Discharge status: Home Health | Payer: Medicare Other | Attending: Internal Medicine | Admitting: Internal Medicine

## 2019-11-12 DIAGNOSIS — E785 Hyperlipidemia, unspecified: Secondary | ICD-10-CM | POA: Diagnosis present

## 2019-11-12 DIAGNOSIS — Z981 Arthrodesis status: Secondary | ICD-10-CM

## 2019-11-12 DIAGNOSIS — R918 Other nonspecific abnormal finding of lung field: Secondary | ICD-10-CM | POA: Diagnosis not present

## 2019-11-12 DIAGNOSIS — J9621 Acute and chronic respiratory failure with hypoxia: Secondary | ICD-10-CM | POA: Diagnosis present

## 2019-11-12 DIAGNOSIS — Z20822 Contact with and (suspected) exposure to covid-19: Secondary | ICD-10-CM | POA: Diagnosis not present

## 2019-11-12 DIAGNOSIS — E119 Type 2 diabetes mellitus without complications: Secondary | ICD-10-CM

## 2019-11-12 DIAGNOSIS — J440 Chronic obstructive pulmonary disease with acute lower respiratory infection: Secondary | ICD-10-CM | POA: Diagnosis present

## 2019-11-12 DIAGNOSIS — Z7984 Long term (current) use of oral hypoglycemic drugs: Secondary | ICD-10-CM

## 2019-11-12 DIAGNOSIS — Z7982 Long term (current) use of aspirin: Secondary | ICD-10-CM

## 2019-11-12 DIAGNOSIS — R7881 Bacteremia: Secondary | ICD-10-CM | POA: Diagnosis not present

## 2019-11-12 DIAGNOSIS — Z8719 Personal history of other diseases of the digestive system: Secondary | ICD-10-CM

## 2019-11-12 DIAGNOSIS — F17210 Nicotine dependence, cigarettes, uncomplicated: Secondary | ICD-10-CM | POA: Diagnosis present

## 2019-11-12 DIAGNOSIS — J449 Chronic obstructive pulmonary disease, unspecified: Secondary | ICD-10-CM | POA: Diagnosis present

## 2019-11-12 DIAGNOSIS — J154 Pneumonia due to other streptococci: Principal | ICD-10-CM | POA: Diagnosis present

## 2019-11-12 DIAGNOSIS — I10 Essential (primary) hypertension: Secondary | ICD-10-CM | POA: Diagnosis not present

## 2019-11-12 DIAGNOSIS — J189 Pneumonia, unspecified organism: Secondary | ICD-10-CM

## 2019-11-12 DIAGNOSIS — J9601 Acute respiratory failure with hypoxia: Secondary | ICD-10-CM | POA: Diagnosis present

## 2019-11-12 DIAGNOSIS — Z86718 Personal history of other venous thrombosis and embolism: Secondary | ICD-10-CM

## 2019-11-12 DIAGNOSIS — Z888 Allergy status to other drugs, medicaments and biological substances status: Secondary | ICD-10-CM

## 2019-11-12 DIAGNOSIS — H409 Unspecified glaucoma: Secondary | ICD-10-CM | POA: Diagnosis present

## 2019-11-12 DIAGNOSIS — Z79899 Other long term (current) drug therapy: Secondary | ICD-10-CM

## 2019-11-12 DIAGNOSIS — E876 Hypokalemia: Secondary | ICD-10-CM | POA: Diagnosis present

## 2019-11-12 DIAGNOSIS — R069 Unspecified abnormalities of breathing: Secondary | ICD-10-CM | POA: Diagnosis not present

## 2019-11-12 DIAGNOSIS — E1151 Type 2 diabetes mellitus with diabetic peripheral angiopathy without gangrene: Secondary | ICD-10-CM | POA: Diagnosis present

## 2019-11-12 DIAGNOSIS — I959 Hypotension, unspecified: Secondary | ICD-10-CM | POA: Diagnosis not present

## 2019-11-12 DIAGNOSIS — K219 Gastro-esophageal reflux disease without esophagitis: Secondary | ICD-10-CM | POA: Diagnosis present

## 2019-11-12 DIAGNOSIS — Z800 Family history of malignant neoplasm of digestive organs: Secondary | ICD-10-CM

## 2019-11-12 DIAGNOSIS — Z9981 Dependence on supplemental oxygen: Secondary | ICD-10-CM

## 2019-11-12 DIAGNOSIS — R0902 Hypoxemia: Secondary | ICD-10-CM | POA: Diagnosis not present

## 2019-11-12 DIAGNOSIS — Z9049 Acquired absence of other specified parts of digestive tract: Secondary | ICD-10-CM

## 2019-11-12 DIAGNOSIS — M810 Age-related osteoporosis without current pathological fracture: Secondary | ICD-10-CM | POA: Diagnosis present

## 2019-11-12 MED ORDER — SODIUM CHLORIDE 0.9 % IV BOLUS
500.0000 mL | Freq: Once | INTRAVENOUS | Status: AC
  Administered 2019-11-13: 500 mL via INTRAVENOUS

## 2019-11-12 NOTE — ED Triage Notes (Signed)
Arrived by [facility] from home. Patient reports SHOB x1 week. Patient wears 4L/[place] at all times at home but EMS increased to 6L/[place] because patient O2 saturation was 80%. Patient increased to 88-94% on 6L/[place]

## 2019-11-12 NOTE — ED Provider Notes (Addendum)
[facility] DEPT Provider Note   CSN: [id number] Arrival date & time: 11/12/19  2318     History Chief Complaint  Patient presents with  . Shortness of Breath    Briana Barr is a 79 y.o. female with a history of chronic respiratory failure on 4L via [place] @ baseline, COPD, prior DVT, & hyperlipidemia who presents to the ED via EMS from home for evaluation of dyspnea over the past 1 week. Patient states she has been having dyspnea with activity & generalized fatigue/weakness with associated productive cough & subjective fevers. Other than activity no major aggravating factors. Using her inhaler without much relief. Called & discussed with patient's son via telephone- relays that she has been getting progressively weaker, walks with a cane at baseline, has been using a wheelchair. She lives by herself but family does live two doors down. Per EMS, fire dept got to 911 call first, patient was reported to be 80% on her baseline 4L via [place], increased to 6L with good response, gave 1 albuterol neb PTA. Patient denies wheezing, chest pain, abdominal pain, nausea, vomiting, or leg swelling.   HPI     Past Medical History:  Diagnosis Date  . Barrett esophagus   . Colon polyps    benign  . COPD (chronic obstructive pulmonary disease) ([place])   . Duodenal ulcer   . DVT (deep venous thrombosis) ([facility])    left leg x 2  . Gallstones   . GERD (gastroesophageal reflux disease)   . Glaucoma   . Hyperlipidemia   . Ischemic colitis ([place])   . Osteoporosis   . Pneumonia    hx of x 2 as a child   . Renal cyst, right   . Retinopathy, background, proliferative     Patient Active Problem List   Diagnosis Date Noted  . Abnormal CT scan, colon   . Benign neoplasm of sigmoid colon   . Benign neoplasm of rectum   . Loss of weight   . Epigastric abdominal tenderness without rebound tenderness   . Anorexia     Past Surgical History:  Procedure Laterality Date  . cervical  vertebral fusion     c5-6, 6-7  . CHOLECYSTECTOMY    . COLONOSCOPY WITH PROPOFOL N/A 11/23/2016   Procedure: COLONOSCOPY WITH PROPOFOL;  Surgeon: Ladene Artist, MD;  Location: [facility] END[facility]COPY;  Service: Endoscopy;  Laterality: N/A;  . ESOPHAGOGASTRODUODENOSCOPY (EGD) WITH PROPOFOL N/A 11/23/2016   Procedure: ESOPHAGOGASTRODUODENOSCOPY (EGD) WITH PROPOFOL;  Surgeon: Ladene Artist, MD;  Location: [facility];  Service: Endoscopy;  Laterality: N/A;  . FEMORAL-POPLITEAL BYPASS GRAFT    . IR RADIOLOGIST EVAL & MGMT  06/06/2019     OB History   No obstetric history on file.     Family History  Problem Relation Age of Onset  . Other Father        some sort of blood cancer-had to get transfusions  . Stomach cancer Maternal Grandmother   . Stomach cancer Maternal Grandfather     Social History   Tobacco Use  . Smoking status: Current Every Day Smoker    Packs/day: 1.00    Types: Cigarettes  . Smokeless tobacco: Never Used  . Tobacco comment: started age 31  Vaping Use  . Vaping Use: Never used  Substance Use Topics  . Alcohol use: No  . Drug use: No    Home Medications Prior to Admission medications   Medication Sig Start Date End Date Taking? Authorizing Provider  acetaZOLAMIDE (DIAMOX) 250 MG tablet TAKE 1 TABLET BY MOUTH EVERY DAY 08/11/19   [name], MD  albuterol (PROAIR HFA) 108 (90 Base) MCG/ACT inhaler Inhale 2 puffs into the lungs every 6 (six) hours as needed for wheezing or shortness of breath. 03/06/19   Alycia Rossetti, MD  aspirin 81 MG tablet Take 81 mg by mouth daily.    [provider]  Cholecalciferol (VITAMIN D3) 250 MCG (10000 UT) capsule Take 1 capsule (10,000 Units total) by mouth daily. 01/20/19   [name], MD  escitalopram (LEXAPRO) 10 MG tablet TAKE 1 TABLET BY MOUTH EVERY DAY 02/06/19   [name], MD  Fluticasone-Umeclidin-Vilant (TRELEGY ELLIPTA) 100-62.5-25 MCG/INH AEPB Inhale 1 Inhaler into the lungs daily. 10/05/19    [name], MD  glimepiride (AMARYL) 1 MG tablet TAKE 1 TABLET BY MOUTH EVERY DAY IN THE MORNING 04/26/19   [name], MD  HYDROcodone-acetaminophen (NORCO) 5-325 MG tablet Take 1 tablet by mouth 4 (four) times daily. 09/12/19   [name], MD  ipratropium-albuterol (DUONEB) 0.5-2.5 (3) MG/3ML SOLN Take 3 mLs by nebulization every 6 (six) hours as needed. 09/28/19   [name], MD  losartan (COZAAR) 50 MG tablet Take 1 tablet (50 mg total) by mouth daily. 01/12/19   [name], MD  metFORMIN (GLUCOPHAGE) 500 MG tablet TAKE 1 TABLET BY MOUTH TWICE A DAY WITH MEALS 06/19/19   [name], MD  Multiple Vitamins-Minerals (CENTRUM SILVER 50+WOMEN) TABS Take 1 tablet by mouth daily. 01/20/19   [name], MD  omeprazole (PRILOSEC) 20 MG capsule TAKE 1 CAPSULE BY MOUTH TWICE A DAY BEFORE A MEAL 01/23/19   [name], MD  rosuvastatin (CRESTOR) 20 MG tablet TAKE 1 TABLET BY MOUTH EVERYDAY AT BEDTIME 07/12/19   [name], MD    Allergies    Vioxx [rofecoxib]  Review of Systems   Review of Systems  Constitutional: Positive for fatigue and fever (subjective). Negative for chills.  HENT: Negative for congestion, ear pain, trouble swallowing and voice change.   Respiratory: Positive for cough and shortness of breath. Negative for wheezing.   Cardiovascular: Negative for chest pain and leg swelling.  Gastrointestinal: Negative for abdominal pain, diarrhea, nausea and vomiting.  Neurological: Positive for weakness (generalized). Negative for syncope and speech difficulty.  All other systems reviewed and are negative.   Physical Exam Updated Vital Signs BP 98/76 (BP Location: Right Arm)   Pulse (!) 110   Temp 98.8 F (37.1 C) (Rectal)   Resp (!) 26   Ht 5\' 6"  (1.676 m)   Wt 61.2 kg   SpO2 92%   BMI 21.79 kg/m   Physical Exam Vitals and nursing note reviewed.  Constitutional:      General: She is not in acute distress.    Appearance:  She is well-developed. She is not toxic-appearing.  HENT:     Head: Normocephalic and atraumatic.  Eyes:     General:        Right eye: No discharge.        Left eye: No discharge.     Conjunctiva/sclera: Conjunctivae normal.  Cardiovascular:     Rate and Rhythm: Regular rhythm. Tachycardia present.  Pulmonary:     Effort: No accessory muscle usage or respiratory distress.     Breath sounds: Rhonchi (bibasilar L>R) and rales (bibasilar L>R) present. No wheezing.     Comments: SPO2 94% on baseline 4 L via nasal  cannula. Abdominal:     General: There is no distension.     Palpations: Abdomen is soft.     Tenderness: There is no abdominal tenderness. There is no guarding or rebound.  Musculoskeletal:     Cervical back: Neck supple.     Right lower leg: No tenderness. No edema.     Left lower leg: No tenderness. No edema.  Skin:    General: Skin is warm and dry.     Findings: No rash.  Neurological:     Mental Status: She is alert.     Comments: Clear speech.   Psychiatric:        Behavior: Behavior normal.    ED Results / Procedures / Treatments   Labs (all labs ordered are listed, but only abnormal results are displayed) Labs Reviewed  CBC WITH DIFFERENTIAL/PLATELET - Abnormal; Notable for the following components:      Result Value   WBC 21.9 (*)    MCV 100.7 (*)    MCHC 29.9 (*)    Platelets 442 (*)    Neutro Abs 19.1 (*)    Lymphs Abs 0.5 (*)    Abs Immature Granulocytes 1.37 (*)    All other components within normal limits  COMPREHENSIVE METABOLIC PANEL - Abnormal; Notable for the following components:   Potassium 3.3 (*)    Glucose, Bld 244 (*)    BUN 36 (*)    All other components within normal limits  APTT - Abnormal; Notable for the following components:   aPTT 43 (*)    All other components within normal limits  LACTIC ACID, PLASMA - Abnormal; Notable for the following components:   Lactic Acid, Venous 2.2 (*)    All other components within normal limits   CULTURE, BLOOD (ROUTINE X 2)  CULTURE, BLOOD (ROUTINE X 2)  URINE CULTURE  SARS CORONAVIRUS 2 BY RT PCR (HOSPITAL ORDER, [facility])  PROTIME-INR  LACTIC ACID, PLASMA  URINALYSIS, ROUTINE W REFLEX MICROSCOPIC    EKG EKG Interpretation  Date/Time:  Sunday November 12 2019 23:44:42 EDT Ventricular Rate:  114 PR Interval:    QRS Duration: 74 QT Interval:  300 QTC Calculation: 414 R Axis:   78 Text Interpretation: Sinus tachycardia Atrial premature complexes Left atrial enlargement Inferior infarct, acute (LCx) Anterior infarct, old Lateral leads are also involved Since last tracing rate faster Confirmed by Wandra Arthurs [phone]) on 11/13/2019 12:15:46 AM   Radiology DG Chest Portable 1 View  Result Date: 11/13/2019 CLINICAL DATA:  Dyspnea and shortness of breath EXAM: PORTABLE CHEST 1 VIEW COMPARISON:  None. FINDINGS: The heart size and mediastinal contours are within normal limits. Mildly hazy airspace opacity seen within the left lower lung with diffusely increased interstitial markings. Aortic knob calcifications are seen. The visualized skeletal structures are unremarkable. IMPRESSION: Mildly increased airspace opacities within the left lung which could be due to asymmetric edema and/or infectious etiology. Electronically Signed   By: Prudencio Pair M.D.   On: 11/13/2019 00:38    Procedures .Critical Care Performed by: Amaryllis Dyke, PA-C Authorized by: Amaryllis Dyke, PA-C    CRITICAL CARE Performed by: Kennith Maes   Total critical care time: 30 minutes  Critical care time was exclusive of separately billable procedures and treating other patients.  Critical care was necessary to treat or prevent imminent or life-threatening deterioration.  Critical care was time spent personally by me on the following activities: development of treatment plan with patient and/or  surrogate as well as nursing, discussions with consultants,  evaluation of patient's response to treatment, examination of patient, obtaining history from patient or surrogate, ordering and performing treatments and interventions, ordering and review of laboratory studies, ordering and review of radiographic studies, pulse oximetry and re-evaluation of patient's condition.   (including critical care time)  Medications Ordered in ED Medications  cefTRIAXone (ROCEPHIN) 1 g in sodium chloride 0.9 % 100 mL IVPB (has no administration in time range)  azithromycin (ZITHROMAX) 500 mg in sodium chloride 0.9 % 250 mL IVPB (has no administration in time range)  potassium chloride SA (KLOR-CON) CR tablet 40 mEq (has no administration in time range)  sodium chloride 0.9 % bolus 500 mL (0 mLs Intravenous Stopped 11/13/19 0054)  methylPREDNISolone sodium succinate (SOLU-MEDROL) 125 mg/2 mL injection 125 mg (125 mg Intravenous Given 11/13/19 0051)    ED Course  I have reviewed the triage vital signs and the nursing notes.  Pertinent labs & imaging results that were available during my care of the patient were reviewed by me and considered in my medical decision making (see chart for details).  Briana Barr was evaluated in Emergency Department on 11/13/2019 for the symptoms described in the history of present illness. He/she was evaluated in the context of the global COVID-19 pandemic, which necessitated consideration that the patient might be at risk for infection with the SARS-CoV-2 virus that causes COVID-19. Institutional protocols and algorithms that pertain to the evaluation of patients at risk for COVID-19 are in a state of rapid change based on information released by regulatory bodies including the CDC and federal and state organizations. These policies and algorithms were followed during the patient's care in the ED.    MDM Rules/Calculators/A&P                         Patient presents to the ED with complaints of dyspnea & fatigue x 1 week.  Patient is nontoxic,  mildly tachycardic (is S/p albuterol), SpO2 92-94% on her baseline O2, afebrile on rectal temp. patient has some rales/rhonchi bibasilarly (L>R). Not in respiratory distress. No peripheral edema noted. No obvious wheezing on my assessment, she is S/p albuterol prior to arrival- will give solumedrol to cover for COPD exacerbation.   DDX: CAP, covid 81, COPD exacerbation, new onset CHF, critical anemia, electrolyte derangement, PE.   Additional history obtained:  Additional history obtained from patient's son via telephone & EMS. Previous records obtained and reviewed.   EKG: Since last tracing faster rate.  Lab Tests:  I Ordered, reviewed, and interpreted labs, which included:  CBC: Leukocytosis at 21.9 with left shift. CMP: Mild hypokalemia which will be orally replaced.  Hyperglycemia without acidosis or anion gap elevation. APTT: mild elevation PT/INR: WNL Lactic acid: mild elevation @ 2.2  Imaging Studies ordered:  I ordered imaging studies which included CXR, I independently visualized and interpreted imaging which showed Mildly increased airspace opacities within the left lung which could be due to asymmetric edema and/or infectious etiology.  Favor infectious etiology in terms of chest x-ray interpretation, especially given patient's leukocytosis, productive cough, and lack of peripheral edema.  We will start Rocephin and azithromycin for antibiotic coverage.  Anticipate admission.  Vitals:   11/12/19 2340 11/12/19 2341 11/13/19 0030 11/13/19 0100  BP: 98/76  (!) 123/53 (!) 116/56  Pulse: (!) 110  (!) 107 (!) 106  Temp: 98.8 F (37.1 C)     Resp: (!) 26  (!) 27 21  Height:  5\' 6"  (1.676 m)    Weight:  61.2 kg    SpO2: 92%  98% 94%  TempSrc: Rectal     BMI (Calculated):  74.7      Systolic BP not < 90, MAP not < 65, Lactic not > 4, will hold off on 30 cc/kg bolus and continue to monitor.   01:29: CONSULT: Discussed with hospitalist Dr. Hal Hope- accepts  admission  Findings and plan of care discussed with supervising physician Dr. Darl Householder who is in agreement.   Portions of this note were generated with [employer]. Dictation errors may occur despite best attempts at proofreading.  Final Clinical Impression(s) / ED Diagnoses Final diagnoses:  Community acquired pneumonia of left lower lobe of lung    Rx / DC Orders ED Discharge Orders    None       Amaryllis Dyke, PA-C 11/13/19 0140    Amaryllis Dyke, PA-C 11/13/19 0511    Drenda Freeze, MD 11/13/19 0516    Drenda Freeze, MD 11/13/19 [phone]

## 2019-11-13 ENCOUNTER — Inpatient Hospital Stay (HOSPITAL_COMMUNITY): Payer: Medicare Other

## 2019-11-13 ENCOUNTER — Encounter (HOSPITAL_COMMUNITY): Payer: Self-pay | Admitting: Internal Medicine

## 2019-11-13 DIAGNOSIS — J189 Pneumonia, unspecified organism: Secondary | ICD-10-CM

## 2019-11-13 DIAGNOSIS — Z0389 Encounter for observation for other suspected diseases and conditions ruled out: Secondary | ICD-10-CM | POA: Diagnosis not present

## 2019-11-13 DIAGNOSIS — J440 Chronic obstructive pulmonary disease with acute lower respiratory infection: Secondary | ICD-10-CM | POA: Diagnosis present

## 2019-11-13 DIAGNOSIS — K219 Gastro-esophageal reflux disease without esophagitis: Secondary | ICD-10-CM | POA: Diagnosis present

## 2019-11-13 DIAGNOSIS — Z9049 Acquired absence of other specified parts of digestive tract: Secondary | ICD-10-CM | POA: Diagnosis not present

## 2019-11-13 DIAGNOSIS — F17210 Nicotine dependence, cigarettes, uncomplicated: Secondary | ICD-10-CM | POA: Diagnosis present

## 2019-11-13 DIAGNOSIS — J9601 Acute respiratory failure with hypoxia: Secondary | ICD-10-CM | POA: Diagnosis not present

## 2019-11-13 DIAGNOSIS — Z79899 Other long term (current) drug therapy: Secondary | ICD-10-CM | POA: Diagnosis not present

## 2019-11-13 DIAGNOSIS — I10 Essential (primary) hypertension: Secondary | ICD-10-CM | POA: Diagnosis present

## 2019-11-13 DIAGNOSIS — E119 Type 2 diabetes mellitus without complications: Secondary | ICD-10-CM

## 2019-11-13 DIAGNOSIS — Z7982 Long term (current) use of aspirin: Secondary | ICD-10-CM | POA: Diagnosis not present

## 2019-11-13 DIAGNOSIS — M810 Age-related osteoporosis without current pathological fracture: Secondary | ICD-10-CM | POA: Diagnosis present

## 2019-11-13 DIAGNOSIS — Z20822 Contact with and (suspected) exposure to covid-19: Secondary | ICD-10-CM | POA: Diagnosis present

## 2019-11-13 DIAGNOSIS — E785 Hyperlipidemia, unspecified: Secondary | ICD-10-CM | POA: Diagnosis present

## 2019-11-13 DIAGNOSIS — E1151 Type 2 diabetes mellitus with diabetic peripheral angiopathy without gangrene: Secondary | ICD-10-CM | POA: Diagnosis present

## 2019-11-13 DIAGNOSIS — J449 Chronic obstructive pulmonary disease, unspecified: Secondary | ICD-10-CM

## 2019-11-13 DIAGNOSIS — R7881 Bacteremia: Secondary | ICD-10-CM | POA: Diagnosis present

## 2019-11-13 DIAGNOSIS — Z800 Family history of malignant neoplasm of digestive organs: Secondary | ICD-10-CM | POA: Diagnosis not present

## 2019-11-13 DIAGNOSIS — Z888 Allergy status to other drugs, medicaments and biological substances status: Secondary | ICD-10-CM | POA: Diagnosis not present

## 2019-11-13 DIAGNOSIS — Z9981 Dependence on supplemental oxygen: Secondary | ICD-10-CM | POA: Diagnosis not present

## 2019-11-13 DIAGNOSIS — E876 Hypokalemia: Secondary | ICD-10-CM | POA: Diagnosis present

## 2019-11-13 DIAGNOSIS — R918 Other nonspecific abnormal finding of lung field: Secondary | ICD-10-CM | POA: Diagnosis not present

## 2019-11-13 DIAGNOSIS — Z7984 Long term (current) use of oral hypoglycemic drugs: Secondary | ICD-10-CM | POA: Diagnosis not present

## 2019-11-13 DIAGNOSIS — H409 Unspecified glaucoma: Secondary | ICD-10-CM | POA: Diagnosis present

## 2019-11-13 DIAGNOSIS — Z86718 Personal history of other venous thrombosis and embolism: Secondary | ICD-10-CM | POA: Diagnosis not present

## 2019-11-13 DIAGNOSIS — Z8719 Personal history of other diseases of the digestive system: Secondary | ICD-10-CM | POA: Diagnosis not present

## 2019-11-13 DIAGNOSIS — Z981 Arthrodesis status: Secondary | ICD-10-CM | POA: Diagnosis not present

## 2019-11-13 DIAGNOSIS — J154 Pneumonia due to other streptococci: Secondary | ICD-10-CM | POA: Diagnosis present

## 2019-11-13 DIAGNOSIS — J9621 Acute and chronic respiratory failure with hypoxia: Secondary | ICD-10-CM | POA: Diagnosis present

## 2019-11-13 LAB — URINALYSIS, ROUTINE W REFLEX MICROSCOPIC
Bacteria, UA: NONE SEEN
Bilirubin Urine: NEGATIVE
Glucose, UA: NEGATIVE mg/dL
Hgb urine dipstick: NEGATIVE
Ketones, ur: NEGATIVE mg/dL
Nitrite: NEGATIVE
Protein, ur: 30 mg/dL — AB
Specific Gravity, Urine: 1.016 (ref 1.005–1.030)
pH: 5 (ref 5.0–8.0)

## 2019-11-13 LAB — LACTIC ACID, PLASMA
Lactic Acid, Venous: 1.5 mmol/L (ref 0.5–1.9)
Lactic Acid, Venous: 1.5 mmol/L (ref 0.5–1.9)
Lactic Acid, Venous: 1.6 mmol/L (ref 0.5–1.9)
Lactic Acid, Venous: 2.2 mmol/L (ref 0.5–1.9)

## 2019-11-13 LAB — TSH: TSH: 0.286 u[IU]/mL — ABNORMAL LOW (ref 0.350–4.500)

## 2019-11-13 LAB — BASIC METABOLIC PANEL
Anion gap: 8 (ref 5–15)
BUN: 32 mg/dL — ABNORMAL HIGH (ref 8–23)
CO2: 29 mmol/L (ref 22–32)
Calcium: 8.8 mg/dL — ABNORMAL LOW (ref 8.9–10.3)
Chloride: 104 mmol/L (ref 98–111)
Creatinine, Ser: 0.69 mg/dL (ref 0.44–1.00)
GFR calc Af Amer: >60 mL/min (ref >60)
GFR calc non Af Amer: >60 mL/min (ref >60)
Glucose, Bld: 196 mg/dL — ABNORMAL HIGH (ref 70–99)
Potassium: 4 mmol/L (ref 3.5–5.1)
Sodium: 141 mmol/L (ref 135–145)

## 2019-11-13 LAB — CBC WITH DIFFERENTIAL/PLATELET
Abs Immature Granulocytes: 1.37 10*3/uL — ABNORMAL HIGH (ref 0.00–0.07)
Basophils Absolute: 0.1 10*3/uL (ref 0.0–0.1)
Basophils Relative: 1 %
Eosinophils Absolute: 0 10*3/uL (ref 0.0–0.5)
Eosinophils Relative: 0 %
HCT: 42.1 % (ref 36.0–46.0)
Hemoglobin: 12.6 g/dL (ref 12.0–15.0)
Immature Granulocytes: 6 %
Lymphocytes Relative: 2 %
Lymphs Abs: 0.5 10*3/uL — ABNORMAL LOW (ref 0.7–4.0)
MCH: 30.1 pg (ref 26.0–34.0)
MCHC: 29.9 g/dL — ABNORMAL LOW (ref 30.0–36.0)
MCV: 100.7 fL — ABNORMAL HIGH (ref 80.0–100.0)
Monocytes Absolute: 0.9 10*3/uL (ref 0.1–1.0)
Monocytes Relative: 4 %
Neutro Abs: 19.1 10*3/uL — ABNORMAL HIGH (ref 1.7–7.7)
Neutrophils Relative %: 87 %
Platelets: 442 10*3/uL — ABNORMAL HIGH (ref 150–400)
RBC: 4.18 MIL/uL (ref 3.87–5.11)
RDW: 13.9 % (ref 11.5–15.5)
WBC: 21.9 10*3/uL — ABNORMAL HIGH (ref 4.0–10.5)
nRBC: 0 % (ref 0.0–0.2)

## 2019-11-13 LAB — EXPECTORATED SPUTUM ASSESSMENT W GRAM STAIN, RFLX TO RESP C

## 2019-11-13 LAB — CBC
HCT: 39.8 % (ref 36.0–46.0)
Hemoglobin: 12.1 g/dL (ref 12.0–15.0)
MCH: 30.6 pg (ref 26.0–34.0)
MCHC: 30.4 g/dL (ref 30.0–36.0)
MCV: 100.5 fL — ABNORMAL HIGH (ref 80.0–100.0)
Platelets: 374 10*3/uL (ref 150–400)
RBC: 3.96 MIL/uL (ref 3.87–5.11)
RDW: 13.8 % (ref 11.5–15.5)
WBC: 19 10*3/uL — ABNORMAL HIGH (ref 4.0–10.5)
nRBC: 0 % (ref 0.0–0.2)

## 2019-11-13 LAB — COMPREHENSIVE METABOLIC PANEL
ALT: 18 U/L (ref 0–44)
AST: 17 U/L (ref 15–41)
Albumin: 3.6 g/dL (ref 3.5–5.0)
Alkaline Phosphatase: 57 U/L (ref 38–126)
Anion gap: 10 (ref 5–15)
BUN: 36 mg/dL — ABNORMAL HIGH (ref 8–23)
CO2: 28 mmol/L (ref 22–32)
Calcium: 9 mg/dL (ref 8.9–10.3)
Chloride: 100 mmol/L (ref 98–111)
Creatinine, Ser: 0.79 mg/dL (ref 0.44–1.00)
GFR calc Af Amer: >60 mL/min (ref >60)
GFR calc non Af Amer: >60 mL/min (ref >60)
Glucose, Bld: 244 mg/dL — ABNORMAL HIGH (ref 70–99)
Potassium: 3.3 mmol/L — ABNORMAL LOW (ref 3.5–5.1)
Sodium: 138 mmol/L (ref 135–145)
Total Bilirubin: 0.6 mg/dL (ref 0.3–1.2)
Total Protein: 7.1 g/dL (ref 6.5–8.1)

## 2019-11-13 LAB — STREP PNEUMONIAE URINARY ANTIGEN: Strep Pneumo Urinary Antigen: POSITIVE — AB

## 2019-11-13 LAB — APTT: aPTT: 43 seconds — ABNORMAL HIGH (ref 24–36)

## 2019-11-13 LAB — T4, FREE: Free T4: 0.89 ng/dL (ref 0.61–1.12)

## 2019-11-13 LAB — BRAIN NATRIURETIC PEPTIDE: B Natriuretic Peptide: 193.1 pg/mL — ABNORMAL HIGH (ref 0.0–100.0)

## 2019-11-13 LAB — TROPONIN I (HIGH SENSITIVITY)
Troponin I (High Sensitivity): 13 ng/L (ref <18)
Troponin I (High Sensitivity): 11 ng/L (ref <18)

## 2019-11-13 LAB — SARS CORONAVIRUS 2 BY RT PCR (HOSPITAL ORDER, PERFORMED IN ~~LOC~~ HOSPITAL LAB): SARS Coronavirus 2: NEGATIVE

## 2019-11-13 LAB — D-DIMER, QUANTITATIVE: D-Dimer, Quant: 3.21 ug/mL-FEU — ABNORMAL HIGH (ref 0.00–0.50)

## 2019-11-13 LAB — PROTIME-INR
INR: 1.2 (ref 0.8–1.2)
Prothrombin Time: 14.4 seconds (ref 11.4–15.2)

## 2019-11-13 LAB — CBG MONITORING, ED: Glucose-Capillary: 237 mg/dL — ABNORMAL HIGH (ref 70–99)

## 2019-11-13 LAB — GLUCOSE, CAPILLARY: Glucose-Capillary: 233 mg/dL — ABNORMAL HIGH (ref 70–99)

## 2019-11-13 MED ORDER — ASPIRIN 81 MG PO CHEW
81.0000 mg | CHEWABLE_TABLET | Freq: Every day | ORAL | Status: DC
  Administered 2019-11-13 – 2019-11-20 (×8): 81 mg via ORAL
  Filled 2019-11-13 (×8): qty 1

## 2019-11-13 MED ORDER — IPRATROPIUM-ALBUTEROL 0.5-2.5 (3) MG/3ML IN SOLN
3.0000 mL | Freq: Three times a day (TID) | RESPIRATORY_TRACT | Status: DC
  Administered 2019-11-13: 3 mL via RESPIRATORY_TRACT
  Filled 2019-11-13: qty 3

## 2019-11-13 MED ORDER — IPRATROPIUM BROMIDE 0.02 % IN SOLN
0.5000 mg | RESPIRATORY_TRACT | Status: DC
  Administered 2019-11-13: 0.5 mg via RESPIRATORY_TRACT
  Filled 2019-11-13: qty 2.5

## 2019-11-13 MED ORDER — BUDESONIDE 0.25 MG/2ML IN SUSP
0.2500 mg | Freq: Two times a day (BID) | RESPIRATORY_TRACT | Status: DC
  Administered 2019-11-13 – 2019-11-20 (×14): 0.25 mg via RESPIRATORY_TRACT
  Filled 2019-11-13 (×15): qty 2

## 2019-11-13 MED ORDER — ONDANSETRON HCL 4 MG PO TABS: 4.0000 mg | ORAL_TABLET | Freq: Four times a day (QID) | ORAL | Status: DC | PRN

## 2019-11-13 MED ORDER — SODIUM CHLORIDE 0.9 % IV SOLN
1.0000 g | Freq: Once | INTRAVENOUS | Status: AC
  Administered 2019-11-13: 1 g via INTRAVENOUS
  Filled 2019-11-13: qty 10

## 2019-11-13 MED ORDER — SODIUM CHLORIDE 0.9 % IV SOLN
2.0000 g | INTRAVENOUS | Status: DC
  Administered 2019-11-13: 2 g via INTRAVENOUS
  Filled 2019-11-13: qty 20

## 2019-11-13 MED ORDER — SODIUM CHLORIDE 0.9 % IV SOLN: 2.0000 g | INTRAVENOUS | Status: DC

## 2019-11-13 MED ORDER — LOSARTAN POTASSIUM 50 MG PO TABS
50.0000 mg | ORAL_TABLET | Freq: Every day | ORAL | Status: DC
  Administered 2019-11-13 – 2019-11-20 (×8): 50 mg via ORAL
  Filled 2019-11-13 (×7): qty 1

## 2019-11-13 MED ORDER — ENOXAPARIN SODIUM 40 MG/0.4ML ~~LOC~~ SOLN
40.0000 mg | SUBCUTANEOUS | Status: DC
  Administered 2019-11-13 – 2019-11-19 (×7): 40 mg via SUBCUTANEOUS
  Filled 2019-11-13 (×7): qty 0.4

## 2019-11-13 MED ORDER — INSULIN ASPART 100 UNIT/ML ~~LOC~~ SOLN
0.0000 [IU] | Freq: Three times a day (TID) | SUBCUTANEOUS | Status: DC
  Administered 2019-11-13: 3 [IU] via SUBCUTANEOUS
  Administered 2019-11-14: 2 [IU] via SUBCUTANEOUS
  Administered 2019-11-14: 1 [IU] via SUBCUTANEOUS
  Administered 2019-11-14: 2 [IU] via SUBCUTANEOUS
  Administered 2019-11-15: 3 [IU] via SUBCUTANEOUS
  Administered 2019-11-15 – 2019-11-16 (×2): 2 [IU] via SUBCUTANEOUS
  Administered 2019-11-16: 3 [IU] via SUBCUTANEOUS
  Administered 2019-11-16 – 2019-11-17 (×2): 2 [IU] via SUBCUTANEOUS
  Administered 2019-11-17: 3 [IU] via SUBCUTANEOUS
  Administered 2019-11-18: 1 [IU] via SUBCUTANEOUS
  Administered 2019-11-18: 3 [IU] via SUBCUTANEOUS
  Administered 2019-11-18: 5 [IU] via SUBCUTANEOUS
  Administered 2019-11-19 (×2): 3 [IU] via SUBCUTANEOUS
  Administered 2019-11-19: 2 [IU] via SUBCUTANEOUS
  Administered 2019-11-20: 3 [IU] via SUBCUTANEOUS
  Administered 2019-11-20: 2 [IU] via SUBCUTANEOUS
  Filled 2019-11-13: qty 0.09

## 2019-11-13 MED ORDER — ALBUTEROL SULFATE (2.5 MG/3ML) 0.083% IN NEBU
2.5000 mg | INHALATION_SOLUTION | RESPIRATORY_TRACT | Status: DC
  Administered 2019-11-13: 2.5 mg via RESPIRATORY_TRACT
  Filled 2019-11-13: qty 3

## 2019-11-13 MED ORDER — POTASSIUM CHLORIDE CRYS ER 20 MEQ PO TBCR
40.0000 meq | EXTENDED_RELEASE_TABLET | Freq: Once | ORAL | Status: AC
  Administered 2019-11-13: 40 meq via ORAL
  Filled 2019-11-13: qty 2

## 2019-11-13 MED ORDER — SODIUM CHLORIDE 0.9 % IV SOLN
500.0000 mg | Freq: Once | INTRAVENOUS | Status: AC
  Administered 2019-11-13: 500 mg via INTRAVENOUS
  Filled 2019-11-13: qty 500

## 2019-11-13 MED ORDER — SODIUM CHLORIDE (PF) 0.9 % IJ SOLN
INTRAMUSCULAR | Status: AC
  Administered 2019-11-13: 10 mL
  Filled 2019-11-13: qty 50

## 2019-11-13 MED ORDER — ACETAZOLAMIDE 250 MG PO TABS
250.0000 mg | ORAL_TABLET | Freq: Every day | ORAL | Status: DC
  Administered 2019-11-13 – 2019-11-20 (×8): 250 mg via ORAL
  Filled 2019-11-13 (×8): qty 1

## 2019-11-13 MED ORDER — INSULIN ASPART 100 UNIT/ML ~~LOC~~ SOLN
0.0000 [IU] | Freq: Every day | SUBCUTANEOUS | Status: DC
  Administered 2019-11-13 – 2019-11-17 (×3): 2 [IU] via SUBCUTANEOUS
  Administered 2019-11-18: 3 [IU] via SUBCUTANEOUS
  Administered 2019-11-19: 4 [IU] via SUBCUTANEOUS
  Filled 2019-11-13: qty 0.05

## 2019-11-13 MED ORDER — ONDANSETRON HCL 4 MG/2ML IJ SOLN: 4.0000 mg | Freq: Four times a day (QID) | INTRAMUSCULAR | Status: DC | PRN

## 2019-11-13 MED ORDER — IOHEXOL 350 MG/ML SOLN
100.0000 mL | Freq: Once | INTRAVENOUS | Status: AC | PRN
  Administered 2019-11-13: 100 mL via INTRAVENOUS

## 2019-11-13 MED ORDER — ALBUTEROL SULFATE (2.5 MG/3ML) 0.083% IN NEBU
2.5000 mg | INHALATION_SOLUTION | RESPIRATORY_TRACT | Status: DC | PRN
  Administered 2019-11-14: 2.5 mg via RESPIRATORY_TRACT
  Filled 2019-11-13: qty 3

## 2019-11-13 MED ORDER — METHYLPREDNISOLONE SODIUM SUCC 125 MG IJ SOLR
125.0000 mg | Freq: Once | INTRAMUSCULAR | Status: AC
  Administered 2019-11-13: 125 mg via INTRAVENOUS
  Filled 2019-11-13: qty 2

## 2019-11-13 MED ORDER — ROSUVASTATIN CALCIUM 20 MG PO TABS
20.0000 mg | ORAL_TABLET | Freq: Every day | ORAL | Status: DC
  Administered 2019-11-13 – 2019-11-20 (×8): 20 mg via ORAL
  Filled 2019-11-13 (×9): qty 1

## 2019-11-13 MED ORDER — IPRATROPIUM-ALBUTEROL 0.5-2.5 (3) MG/3ML IN SOLN
3.0000 mL | Freq: Two times a day (BID) | RESPIRATORY_TRACT | Status: DC
  Administered 2019-11-14 – 2019-11-20 (×13): 3 mL via RESPIRATORY_TRACT
  Filled 2019-11-13 (×14): qty 3

## 2019-11-13 MED ORDER — SODIUM CHLORIDE 0.9 % IV SOLN: 500.0000 mg | INTRAVENOUS | Status: DC

## 2019-11-13 MED ORDER — ADULT MULTIVITAMIN W/MINERALS CH
1.0000 | ORAL_TABLET | Freq: Every day | ORAL | Status: DC
  Administered 2019-11-13 – 2019-11-20 (×8): 1 via ORAL
  Filled 2019-11-13 (×8): qty 1

## 2019-11-13 NOTE — Care Plan (Signed)
Briana Barr is a 79 y.o. female with history of chronic respiratory failure secondary to COPD on 4 L oxygen at baseline, hypertension, diabetes mellitus type 2 peripheral vascular disease, previous history of DVT presents to the ER because of worsening shortness of breath over the last week.  chest x-ray showing infiltrates concerning for pneumonia versus edema.  Lab work was significant for WBC count of 21,000,  lactic acid of 2.2, blood glucose 244, potassium of 3.3.  Patient is admitted for acute on chronic respiratory failure secondary to COPD exacerbation and possible pneumonia.  Continue IV antibiotics,  IV Solu-Medrol and nebulization.  Covid test is negative.  Patient is seen and examined at bedside.  She reports feeling better, on 4 L supplemental oxygen saturating 94%.

## 2019-11-13 NOTE — H&P (Addendum)
History and Physical    Briana Barr QHU:[id number] DOB: [birth year] DOA: 11/12/2019  PCP: [name], MD  Patient coming from: Home.  Chief Complaint: Shortness of breath.  HPI: Briana Barr is a 79 y.o. female with history of chronic respiratory failure secondary to COPD on 4 L oxygen, hypertension, diabetes mellitus type 2 peripheral vascular disease, previous history of DVT presents to the ER because of worsening shortness of breath over the last week.  Patient states initially she was having productive cough and has been feeling increasingly weak.  Denies chest pain nausea vomiting or abdominal pain.  Since patient was progressively getting weak patient was brought to the ER.  ED Course: In the ER patient was short of breath with chest x-ray showing infiltrates concerning for pneumonia versus edema.  Lab work was significant for WBC count of 21,000 lactic acid of 2.2 blood glucose 244 potassium of 3.3 EKG shows tachycardia with nonspecific ST-T changes in the inferior leads.  Patient was started on empiric antibiotics for pneumonia also was started on nebulizer and steroids for possible COPD exacerbation.  Covid test is negative.  Review of Systems: As per HPI, rest all negative.   Past Medical History:  Diagnosis Date  . Barrett esophagus   . Colon polyps    benign  . COPD (chronic obstructive pulmonary disease) ([place])   . Duodenal ulcer   . DVT (deep venous thrombosis) (HCC)    left leg x 2  . Gallstones   . GERD (gastroesophageal reflux disease)   . Glaucoma   . Hyperlipidemia   . Ischemic colitis ([place])   . Osteoporosis   . Pneumonia    hx of x 2 as a child   . Renal cyst, right   . Retinopathy, background, proliferative     Past Surgical History:  Procedure Laterality Date  . cervical vertebral fusion     c5-6, 6-7  . CHOLECYSTECTOMY    . COLONOSCOPY WITH PROPOFOL N/A 11/23/2016   Procedure: COLONOSCOPY WITH PROPOFOL;  Surgeon: Ladene Artist, MD;  Location:  [facility];  Service: Endoscopy;  Laterality: N/A;  . ESOPHAGOGASTRODUODENOSCOPY (EGD) WITH PROPOFOL N/A 11/23/2016   Procedure: ESOPHAGOGASTRODUODENOSCOPY (EGD) WITH PROPOFOL;  Surgeon: Ladene Artist, MD;  Location: [facility];  Service: Endoscopy;  Laterality: N/A;  . FEMORAL-POPLITEAL BYPASS GRAFT    . IR RADIOLOGIST EVAL & MGMT  06/06/2019     reports that she has been smoking cigarettes. She has been smoking about 1.00 pack per day. She has never used smokeless tobacco. She reports that she does not drink alcohol and does not use drugs.  Allergies  Allergen Reactions  . Vioxx [Rofecoxib] Swelling    Family History  Problem Relation Age of Onset  . Other Father        some sort of blood cancer-had to get transfusions  . Stomach cancer Maternal Grandmother   . Stomach cancer Maternal Grandfather     Prior to Admission medications   Medication Sig Start Date End Date Taking? Authorizing Provider  acetaZOLAMIDE (DIAMOX) 250 MG tablet TAKE 1 TABLET BY MOUTH EVERY DAY Patient taking differently: Take 250 mg by mouth daily.  08/11/19  Yes [name], MD  albuterol (PROAIR HFA) 108 (90 Base) MCG/ACT inhaler Inhale 2 puffs into the lungs every 6 (six) hours as needed for wheezing or shortness of breath. 03/06/19  Yes [place], Modena Nunnery, MD  aspirin 81 MG tablet Take 81 mg by mouth daily.   Yes [provider]  Fluticasone-Umeclidin-Vilant (TRELEGY ELLIPTA) 100-62.5-25 MCG/INH AEPB Inhale 1 Inhaler into the lungs daily. 10/05/19  Yes [name], MD  ipratropium-albuterol (DUONEB) 0.5-2.5 (3) MG/3ML SOLN Take 3 mLs by nebulization every 6 (six) hours as needed. Patient taking differently: Take 3 mLs by nebulization every 6 (six) hours as needed (SOB, wheezing).  09/28/19  Yes [name], MD  losartan (COZAAR) 50 MG tablet Take 1 tablet (50 mg total) by mouth daily. 01/12/19  Yes [name], MD  metFORMIN (GLUCOPHAGE) 500 MG tablet TAKE 1 TABLET BY MOUTH  TWICE A DAY WITH MEALS 06/19/19  Yes [name], MD  Multiple Vitamins-Minerals (CENTRUM SILVER 50+WOMEN) TABS Take 1 tablet by mouth daily. 01/20/19  Yes [name], MD  rosuvastatin (CRESTOR) 20 MG tablet TAKE 1 TABLET BY MOUTH EVERYDAY AT BEDTIME Patient taking differently: Take 20 mg by mouth daily after breakfast.  07/12/19  Yes [name], MD  Cholecalciferol (VITAMIN D3) 250 MCG (10000 UT) capsule Take 1 capsule (10,000 Units total) by mouth daily. Patient not taking: Reported on 11/13/2019 01/20/19   [name], MD  escitalopram (LEXAPRO) 10 MG tablet TAKE 1 TABLET BY MOUTH EVERY DAY Patient not taking: Reported on 11/13/2019 02/06/19   [name], MD  glimepiride (AMARYL) 1 MG tablet TAKE 1 TABLET BY MOUTH EVERY DAY IN THE MORNING Patient not taking: Reported on 11/13/2019 04/26/19   [name], MD  HYDROcodone-acetaminophen (NORCO) 5-325 MG tablet Take 1 tablet by mouth 4 (four) times daily. Patient not taking: Reported on 11/13/2019 09/12/19   [name], MD  omeprazole (PRILOSEC) 20 MG capsule TAKE 1 CAPSULE BY MOUTH TWICE A DAY BEFORE A MEAL Patient not taking: Reported on 11/13/2019 01/23/19   [name], MD    Physical Exam: Constitutional: Moderately built and nourished. Vitals:   11/13/19 0100 11/13/19 0130 11/13/19 0200 11/13/19 0230  BP: (!) 116/56 (!) 111/49 101/77 (!) 112/55  Pulse: (!) 106 100 (!) 112 98  Resp: 21 (!) 25 (!) 24 19  Temp:      TempSrc:      SpO2: 94% 98% 92% 91%  Weight:      Height:       Eyes: Anicteric no pallor. ENMT: No discharge from the ears eyes nose or mouth. Neck: No mass.  No neck rigidity. Respiratory: No rhonchi or crepitations. Cardiovascular: S1-S2 heard. Abdomen: Soft nontender bowel sounds present. Musculoskeletal: No edema. Skin: No rash. Neurologic: Alert awake oriented to time place and person.  Moves all extremities. Psychiatric: Appears normal.  Normal affect.   Labs on  Admission: I have personally reviewed following labs and imaging studies  CBC: Recent Labs  Lab 11/12/19 2334  WBC 21.9*  NEUTROABS 19.1*  HGB 12.6  HCT 42.1  MCV 100.7*  PLT 086*   Basic Metabolic Panel: Recent Labs  Lab 11/12/19 2334  NA 138  K 3.3*  CL 100  CO2 28  GLUCOSE 244*  BUN 36*  CREATININE 0.79  CALCIUM 9.0   GFR: Estimated Creatinine Clearance: 54.3 mL/min (by C-G formula based on SCr of 0.79 mg/dL). Liver Function Tests: Recent Labs  Lab 11/12/19 2334  AST 17  ALT 18  ALKPHOS 57  BILITOT 0.6  PROT 7.1  ALBUMIN 3.6   No results for input(s): LIPASE, AMYLASE in the last 168 hours. No results for input(s): AMMONIA in the last 168 hours. Coagulation Profile: Recent Labs  Lab 11/12/19 2334  INR 1.2  Cardiac Enzymes: No results for input(s): CKTOTAL, CKMB, CKMBINDEX, TROPONINI in the last 168 hours. BNP (last 3 results) No results for input(s): PROBNP in the last 8760 hours. HbA1C: No results for input(s): HGBA1C in the last 72 hours. CBG: No results for input(s): GLUCAP in the last 168 hours. Lipid Profile: No results for input(s): CHOL, HDL, LDLCALC, TRIG, CHOLHDL, LDLDIRECT in the last 72 hours. Thyroid Function Tests: No results for input(s): TSH, T4TOTAL, FREET4, T3FREE, THYROIDAB in the last 72 hours. Anemia Panel: No results for input(s): VITAMINB12, FOLATE, FERRITIN, TIBC, IRON, RETICCTPCT in the last 72 hours. Urine analysis:    Component Value Date/Time   COLORURINE YELLOW 06/17/2018 1906   APPEARANCEUR CLOUDY (A) 06/17/2018 1906   LABSPEC 1.017 06/17/2018 1906   PHURINE 7.0 06/17/2018 1906   GLUCOSEU >=500 (A) 06/17/2018 1906   HGBUR NEGATIVE 06/17/2018 [place] NEGATIVE 06/17/2018 1906   KETONESUR 5 (A) 06/17/2018 1906   PROTEINUR NEGATIVE 06/17/2018 1906   NITRITE NEGATIVE 06/17/2018 1906   LEUKOCYTESUR NEGATIVE 06/17/2018 1906   Sepsis Labs: @LABRCNTIP (procalcitonin:4,lacticidven:4) ) Recent Results (from  the past 240 hour(s))  Blood culture (routine x 2)     Status: None (Preliminary result)   Collection Time: 11/12/19 11:40 PM   Specimen: BLOOD  Result Value Ref Range Status   Specimen Description   Final    BLOOD RIGHT ANTECUBITAL Performed at [facility], [place] [address] [place], [place] [address]    Special Requests   Final    BOTTLES DRAWN AEROBIC AND ANAEROBIC Blood Culture adequate volume Performed at [place] [address] [place], [place] [address]    Culture PENDING  Incomplete   Report Status PENDING  Incomplete  SARS Coronavirus 2 by RT PCR (hospital order, performed in [facility] hospital lab) Nasopharyngeal Nasopharyngeal Swab     Status: None   Collection Time: 11/13/19  1:00 AM   Specimen: Nasopharyngeal Swab  Result Value Ref Range Status   SARS Coronavirus 2 NEGATIVE NEGATIVE Final    Comment: (NOTE) SARS-CoV-2 target nucleic acids are NOT DETECTED.  The SARS-CoV-2 RNA is generally detectable in upper and lower respiratory specimens during the acute phase of infection. The lowest concentration of SARS-CoV-2 viral copies this assay can detect is 250 copies / mL. A negative result does not preclude SARS-CoV-2 infection and should not be used as the sole basis for treatment or other patient management decisions.  A negative result may occur with improper specimen collection / handling, submission of specimen other than nasopharyngeal swab, presence of viral mutation(s) within the areas targeted by this assay, and inadequate number of viral copies (<250 copies / mL). A negative result must be combined with clinical observations, patient history, and epidemiological information.  Fact Sheet for Patients:   StrictlyIdeas.no  Fact Sheet for Healthcare Providers: BankingDealers.co.za  This test is not yet approved or  cleared by the [place] FDA and has been authorized for  detection and/or diagnosis of SARS-CoV-2 by FDA under an Emergency Use Authorization (EUA).  This EUA will remain in effect (meaning this test can be used) for the duration of the COVID-19 declaration under Section 564(b)(1) of the Act, 21 U.S.C. section 360bbb-3(b)(1), unless the authorization is terminated or revoked sooner.  Performed at [facility], [place] [address] [place],  [address]      Radiological Exams on Admission: DG Chest Portable 1 View  Result Date: 11/13/2019 CLINICAL DATA:  Dyspnea and shortness of breath  EXAM: PORTABLE CHEST 1 VIEW COMPARISON:  None. FINDINGS: The heart size and mediastinal contours are within normal limits. Mildly hazy airspace opacity seen within the left lower lung with diffusely increased interstitial markings. Aortic knob calcifications are seen. The visualized skeletal structures are unremarkable. IMPRESSION: Mildly increased airspace opacities within the left lung which could be due to asymmetric edema and/or infectious etiology. Electronically Signed   By: Prudencio Pair M.D.   On: 11/13/2019 00:38    EKG: Independently reviewed.  Sinus tachycardia with nonspecific ST-T changes in inferior leads.  Assessment/Plan Principal Problem:   Acute respiratory failure with hypoxia (HCC) Active Problems:   COPD (chronic obstructive pulmonary disease) (HCC)   Diabetes mellitus type 2 in nonobese [facility])   Essential hypertension    1. Acute on chronic respiratory failure hypoxia likely from pneumonia.  However I suspect patient may have a component of CHF for which I have ordered a BNP.  Continue with antibiotics and nebulizer treatment.  Patient is usually on 4 L oxygen at home.  Will trend cardiac markers.  Sputum cultures check urine for Legionella strep antigen Covid test was negative.  Since patient has had previous history of DVT will check D-dimer. 2. Hypertension on ARB. 3. Hyperlipidemia on statins. 4. Diabetes mellitus  type 2 on sliding scale coverage. 5. Mild hypokalemia replace and recheck. 6. History of peripheral vascular disease.  Since patient has acute respiratory failure on 4 L oxygen chronically will need close monitoring for any further deterioration in inpatient status.  I have ordered a repeat EKG since it was showing some nonspecific ST-T changes in the inferior leads.  Those changes were already there and on the EKG done in March 2020.  Patient does not have any chest pain.  Troponins are negative.   DVT prophylaxis: Lovenox. Code Status: Full code. Family Communication: Discussed with patient. Disposition Plan: Home. Consults called: None. Admission status: Inpatient.   Rise Patience MD [facility] Pager [phone].  If 7PM-7AM, please contact night-coverage [email or web address] Password [facility]  11/13/2019, 3:29 AM

## 2019-11-13 NOTE — ED Notes (Addendum)
Pt made aware we need a sputum sample.  Cup at bedside.   Accidentally clicked off [employer] tasks at [employer] station.  Will need to utilize white patient labels.

## 2019-11-13 NOTE — ED Notes (Signed)
Patient transported to CT 

## 2019-11-13 NOTE — ED Notes (Signed)
Pt daughter inlaw at bedside  

## 2019-11-13 NOTE — Progress Notes (Signed)
PHARMACY - PHYSICIAN COMMUNICATION CRITICAL VALUE ALERT - BLOOD CULTURE IDENTIFICATION (BCID)  Baleria Stracener is an 79 y.o. female who presented to [facility] on 11/12/2019 with a chief complaint of AECOPD  Assessment:  Strep pnemo bacteremia  Name of physician (or Provider) Contacted: Dwyane Dee  Current antibiotics: ceftriaxone  Changes to prescribed antibiotics recommended:  Patient is on recommended antibiotics - No changes needed  No results found for this or any previous visit.  [place] Form 11/13/2019  5:32 PM

## 2019-11-14 DIAGNOSIS — J189 Pneumonia, unspecified organism: Secondary | ICD-10-CM

## 2019-11-14 DIAGNOSIS — R7881 Bacteremia: Secondary | ICD-10-CM

## 2019-11-14 LAB — BLOOD CULTURE ID PANEL (REFLEXED)

## 2019-11-14 LAB — GLUCOSE, CAPILLARY
Glucose-Capillary: 125 mg/dL — ABNORMAL HIGH (ref 70–99)
Glucose-Capillary: 125 mg/dL — ABNORMAL HIGH (ref 70–99)
Glucose-Capillary: 213 mg/dL — ABNORMAL HIGH (ref 70–99)
Glucose-Capillary: 192 mg/dL — ABNORMAL HIGH (ref 70–99)
Glucose-Capillary: 244 mg/dL — ABNORMAL HIGH (ref 70–99)

## 2019-11-14 LAB — BASIC METABOLIC PANEL
Anion gap: 9 (ref 5–15)
BUN: 27 mg/dL — ABNORMAL HIGH (ref 8–23)
CO2: 29 mmol/L (ref 22–32)
Calcium: 9.1 mg/dL (ref 8.9–10.3)
Chloride: 102 mmol/L (ref 98–111)
Creatinine, Ser: 0.6 mg/dL (ref 0.44–1.00)
GFR calc Af Amer: >60 mL/min (ref >60)
GFR calc non Af Amer: >60 mL/min (ref >60)
Glucose, Bld: 133 mg/dL — ABNORMAL HIGH (ref 70–99)
Potassium: 3.8 mmol/L (ref 3.5–5.1)
Sodium: 140 mmol/L (ref 135–145)

## 2019-11-14 LAB — CBC
HCT: 39.8 % (ref 36.0–46.0)
Hemoglobin: 12 g/dL (ref 12.0–15.0)
MCH: 30.1 pg (ref 26.0–34.0)
MCHC: 30.2 g/dL (ref 30.0–36.0)
MCV: 99.7 fL (ref 80.0–100.0)
Platelets: 429 10*3/uL — ABNORMAL HIGH (ref 150–400)
RBC: 3.99 MIL/uL (ref 3.87–5.11)
RDW: 14 % (ref 11.5–15.5)
WBC: 19.6 10*3/uL — ABNORMAL HIGH (ref 4.0–10.5)
nRBC: 0 % (ref 0.0–0.2)

## 2019-11-14 LAB — HEMOGLOBIN A1C
Hgb A1c MFr Bld: 6.8 % — ABNORMAL HIGH (ref 4.8–5.6)
Mean Plasma Glucose: 148.46 mg/dL

## 2019-11-14 LAB — URINE CULTURE: Culture: <10000 — AB

## 2019-11-14 LAB — T3, FREE: T3, Free: 1.5 pg/mL — ABNORMAL LOW (ref 2.0–4.4)

## 2019-11-14 LAB — LEGIONELLA PNEUMOPHILA SEROGP 1 UR AG: L. pneumophila Serogp 1 Ur Ag: NEGATIVE

## 2019-11-14 MED ORDER — SODIUM CHLORIDE 0.9 % IV SOLN
2.0000 g | INTRAVENOUS | Status: DC
  Administered 2019-11-14 – 2019-11-20 (×7): 2 g via INTRAVENOUS
  Filled 2019-11-14: qty 2
  Filled 2019-11-14: qty 20
  Filled 2019-11-14 (×5): qty 2

## 2019-11-14 NOTE — Assessment & Plan Note (Signed)
continue current regimen.

## 2019-11-14 NOTE — Assessment & Plan Note (Signed)
-   patient on chronic O2 at home, 4L - continue O2

## 2019-11-14 NOTE — Plan of Care (Signed)
  Problem: Education: Goal: Knowledge of risk factors and measures for prevention of condition will improve Outcome: Progressing   

## 2019-11-14 NOTE — Assessment & Plan Note (Signed)
-   no s/s exacerbation - continue to monitor

## 2019-11-14 NOTE — Assessment & Plan Note (Addendum)
-   noted on CXR; also positive strep pna urinary Ag; has also translocated to blood; see bacteremia -Completed antibiotic course in hospital -CXR repeated on 11/19/2019, shows improvement in left-sided pneumonia

## 2019-11-14 NOTE — Progress Notes (Signed)
PROGRESS NOTE    Riane Desaulniers   ZOX:[id number]  DOB: 1941-02-08  DOA: 11/12/2019     1  PCP: [name], MD  CC: weakness  Hospital Course: Kit Rauh is a 79 y.o. female with history of chronic respiratory failure secondary to COPD on 4 L oxygen, hypertension, diabetes mellitus type 2, peripheral vascular disease, previous history of DVT who presented to the ER because of worsening shortness of breath over the last week.  Patient states initially she was having productive cough and has been feeling increasingly weak.  Denies chest pain nausea vomiting or abdominal pain.  Since patient was progressively getting weak patient was brought to the ER.   In the ER patient was short of breath with chest x-ray showing infiltrates concerning for pneumonia versus edema.  Lab work was significant for WBC count of 21,000 lactic acid of 2.2 blood glucose 244 potassium of 3.3 EKG shows tachycardia with nonspecific ST-T changes in the inferior leads.  Patient was started on empiric antibiotics for pneumonia also was started on nebulizer and steroids for possible COPD exacerbation.  Covid test is negative.  Her strep pna urinary Ag was positive and her blood cultures also grew 4/4 bottles with Strep Pna. She was placed on CTX on admission for PNA coverage initially as well.    Interval History:  No events overnight. Still feeling weak this am. Explained to patient diagnosis of pna and bacteremia. She voiced understanding as well as plan to watch repeat cultures.   Old records reviewed in assessment of this patient  ROS: Constitutional: positive for fatigue and malaise, Respiratory: positive for cough, Cardiovascular: negative for chest pain and Gastrointestinal: negative for abdominal pain  Assessment & Plan: Acute respiratory failure with hypoxia ([place]) - patient on chronic O2 at home, 4L - continue O2  COPD (chronic obstructive pulmonary disease) (HCC) - no s/s exacerbation - continue to  monitor   Diabetes mellitus type 2 in nonobese (HCC) - continue SSI and CBG monitoring   Essential hypertension - continue current regimen   CAP (community acquired pneumonia) - noted on CXR; also positive strep pna urinary Ag; has also translocated to blood; see bacteremia - continue rocephin  Bacteremia - admission blood cultures growing 4/4 Strep pna - suspected seeding from lungs - repeat blood cultures on 8/3 - no indication for TTE at this time, but if remains persistently bacteremic will likely order - continue Rocephin    Antimicrobials: Rocephin 11/13/19>> present Azithro 11/13/19 x 1  DVT prophylaxis: Lovenox Code Status: Full Family Communication: none present Disposition Plan:  Status is: Inpatient  Remains inpatient appropriate because:Unsafe d/c plan, IV treatments appropriate due to intensity of illness or inability to take PO and Inpatient level of care appropriate due to severity of illness   Dispo: The patient is from: Home              Anticipated d/c is to: Home              Anticipated d/c date is: > 3 days              Patient currently is not medically stable to d/c.       Objective: Blood pressure 129/71, pulse 86, temperature 99.4 F (37.4 C), temperature source Oral, resp. rate 19, height 5\' 6"  (1.676 m), weight 61.2 kg, SpO2 97 %.  Examination: General appearance: alert, cooperative and fatigued Head: Normocephalic, without obvious abnormality, atraumatic Eyes: EOMI Lungs: coarse breath sounds bilaterally, no obvious  wheezing Heart: regular rate and rhythm and S1, S2 normal Abdomen: normal findings: bowel sounds normal and soft, non-tender Extremities: no edema Skin: mobility and turgor normal Neurologic: Grossly normal   Consultants:   n/a  Procedures:   n/a  Data Reviewed: I have personally reviewed following labs and imaging studies Results for orders placed or performed during the hospital encounter of 11/12/19 (from the  past 24 hour(s))  Glucose, capillary     Status: Abnormal   Collection Time: 11/13/19 10:56 PM  Result Value Ref Range   Glucose-Capillary 233 (H) 70 - 99 mg/dL  CBC     Status: Abnormal   Collection Time: 11/14/19  4:46 AM  Result Value Ref Range   WBC 19.6 (H) 4.0 - 10.5 K/uL   RBC 3.99 3.87 - 5.11 MIL/uL   Hemoglobin 12.0 12.0 - 15.0 g/dL   HCT 39.8 36 - 46 %   MCV 99.7 80.0 - 100.0 fL   MCH 30.1 26.0 - 34.0 pg   MCHC 30.2 30.0 - 36.0 g/dL   RDW 14.0 11.5 - 15.5 %   Platelets 429 (H) 150 - 400 K/uL   nRBC 0.0 0.0 - 0.2 %  Basic metabolic panel     Status: Abnormal   Collection Time: 11/14/19  4:46 AM  Result Value Ref Range   Sodium 140 135 - 145 mmol/L   Potassium 3.8 3.5 - 5.1 mmol/L   Chloride 102 98 - 111 mmol/L   CO2 29 22 - 32 mmol/L   Glucose, Bld 133 (H) 70 - 99 mg/dL   BUN 27 (H) 8 - 23 mg/dL   Creatinine, Ser 0.60 0.44 - 1.00 mg/dL   Calcium 9.1 8.9 - 10.3 mg/dL   GFR calc non Af Amer >60 >60 mL/min   GFR calc Af Amer >60 >60 mL/min   Anion gap 9 5 - 15  Glucose, capillary     Status: Abnormal   Collection Time: 11/14/19  8:30 AM  Result Value Ref Range   Glucose-Capillary 125 (H) 70 - 99 mg/dL  Glucose, capillary     Status: Abnormal   Collection Time: 11/14/19  8:30 AM  Result Value Ref Range   Glucose-Capillary 125 (H) 70 - 99 mg/dL  Glucose, capillary     Status: Abnormal   Collection Time: 11/14/19 11:23 AM  Result Value Ref Range   Glucose-Capillary 213 (H) 70 - 99 mg/dL  Glucose, capillary     Status: Abnormal   Collection Time: 11/14/19  4:04 PM  Result Value Ref Range   Glucose-Capillary 192 (H) 70 - 99 mg/dL    Recent Results (from the past 240 hour(s))  Blood culture (routine x 2)     Status: Abnormal (Preliminary result)   Collection Time: 11/12/19 11:35 PM   Specimen: BLOOD LEFT FOREARM  Result Value Ref Range Status   Specimen Description   Final    BLOOD LEFT FOREARM Performed at [facility], [address]  [place], [place] [address]    Special Requests   Final    BOTTLES DRAWN AEROBIC AND ANAEROBIC Blood Culture adequate volume Performed at [facility] [address] [place], [place] [address]    Culture  Setup Time   Final    GRAM POSITIVE COCCI IN PAIRS IN CHAINS IN BOTH AEROBIC AND ANAEROBIC BOTTLES Organism ID to follow CRITICAL RESULT CALLED TO, READ BACK BY AND VERIFIED WITH: Beatrix Shipper MD @1720  11/13/19 EB Performed at [facility],  [address], [place], [place] [address]    Culture STREPTOCOCCUS PNEUMONIAE (A)  Final   Report Status PENDING  Incomplete  Blood Culture ID Panel (Reflexed)     Status: Abnormal   Collection Time: 11/12/19 11:35 PM  Result Value Ref Range Status   Enterococcus species NOT DETECTED NOT DETECTED Final   Listeria monocytogenes NOT DETECTED NOT DETECTED Final   Staphylococcus species NOT DETECTED NOT DETECTED Final   Staphylococcus aureus (BCID) NOT DETECTED NOT DETECTED Final   Streptococcus species DETECTED (A) NOT DETECTED Final    Comment: RESULT CALLED TO, READ BACK BY AND VERIFIED WITH: Beatrix Shipper MD @1720  11/13/19 EB    Streptococcus agalactiae NOT DETECTED NOT DETECTED Final   Streptococcus pneumoniae DETECTED (A) NOT DETECTED Final    Comment: RESULT CALLED TO, READ BACK BY AND VERIFIED WITH: Beatrix Shipper MD @1720  11/13/19 EB    Streptococcus pyogenes NOT DETECTED NOT DETECTED Final   Acinetobacter baumannii NOT DETECTED NOT DETECTED Final   Enterobacteriaceae species NOT DETECTED NOT DETECTED Final   Enterobacter cloacae complex NOT DETECTED NOT DETECTED Final   Escherichia coli NOT DETECTED NOT DETECTED Final   Klebsiella oxytoca NOT DETECTED NOT DETECTED Final   Klebsiella pneumoniae NOT DETECTED NOT DETECTED Final   Proteus species NOT DETECTED NOT DETECTED Final   Serratia marcescens NOT DETECTED NOT DETECTED Final   Haemophilus influenzae NOT DETECTED NOT DETECTED Final   Neisseria meningitidis NOT  DETECTED NOT DETECTED Final   Pseudomonas aeruginosa NOT DETECTED NOT DETECTED Final   Candida albicans NOT DETECTED NOT DETECTED Final   Candida glabrata NOT DETECTED NOT DETECTED Final   Candida krusei NOT DETECTED NOT DETECTED Final   Candida parapsilosis NOT DETECTED NOT DETECTED Final   Candida tropicalis NOT DETECTED NOT DETECTED Final    Comment: Performed at [facility], [place] [address] [place], [place] [address]  Blood culture (routine x 2)     Status: Abnormal (Preliminary result)   Collection Time: 11/12/19 11:40 PM   Specimen: BLOOD  Result Value Ref Range Status   Specimen Description   Final    BLOOD RIGHT ANTECUBITAL Performed at [facility], [place] [address] [place], [place] [address]    Special Requests   Final    BOTTLES DRAWN AEROBIC AND ANAEROBIC Blood Culture adequate volume Performed at [facility] [address], [place], [place] [address]    Culture  Setup Time   Final    GRAM POSITIVE COCCI IN PAIRS IN CHAINS IN BOTH AEROBIC AND ANAEROBIC BOTTLES CRITICAL VALUE NOTED.  VALUE IS CONSISTENT WITH PREVIOUSLY REPORTED AND CALLED VALUE. Performed at [facility], [place] [address], [place], [place] [address]    Culture STREPTOCOCCUS PNEUMONIAE (A)  Final   Report Status PENDING  Incomplete  SARS Coronavirus 2 by RT PCR (hospital order, performed in [facility] hospital lab) Nasopharyngeal Nasopharyngeal Swab     Status: None   Collection Time: 11/13/19  1:00 AM   Specimen: Nasopharyngeal Swab  Result Value Ref Range Status   SARS Coronavirus 2 NEGATIVE NEGATIVE Final    Comment: (NOTE) SARS-CoV-2 target nucleic acids are NOT DETECTED.  The SARS-CoV-2 RNA is generally detectable in upper and lower respiratory specimens during the acute phase of infection. The lowest concentration of SARS-CoV-2 viral copies this assay can detect is 250 copies / mL. A negative result does not preclude SARS-CoV-2 infection and should not be  used as the sole basis for treatment  or other patient management decisions.  A negative result may occur with improper specimen collection / handling, submission of specimen other than nasopharyngeal swab, presence of viral mutation(s) within the areas targeted by this assay, and inadequate number of viral copies (<250 copies / mL). A negative result must be combined with clinical observations, patient history, and epidemiological information.  Fact Sheet for Patients:   StrictlyIdeas.no  Fact Sheet for Healthcare Providers: BankingDealers.co.za  This test is not yet approved or  cleared by the [place] FDA and has been authorized for detection and/or diagnosis of SARS-CoV-2 by FDA under an Emergency Use Authorization (EUA).  This EUA will remain in effect (meaning this test can be used) for the duration of the COVID-19 declaration under Section 564(b)(1) of the Act, 21 U.S.C. section 360bbb-3(b)(1), unless the authorization is terminated or revoked sooner.  Performed at [facility], [place] [address] [place], [place] [address]   Urine culture     Status: Abnormal   Collection Time: 11/13/19  4:56 AM   Specimen: Urine, Clean Catch  Result Value Ref Range Status   Specimen Description   Final    URINE, CLEAN CATCH Performed at [facility], [place] [address], [place], [place] [address]    Special Requests   Final    NONE Performed at [facility], [place] [address], [place], [place] [address]    Culture (A)  Final    <10,000 COLONIES/mL INSIGNIFICANT GROWTH Performed at [facility] [address] [place], [place] [address]    Report Status 11/14/2019 FINAL  Final  Culture, sputum-assessment     Status: None   Collection Time: 11/13/19  7:51 AM   Specimen: Expectorated Sputum  Result Value Ref Range Status   Specimen Description EXPECTORATED SPUTUM  Final    Special Requests NONE  Final   Sputum evaluation   Final    Sputum specimen not acceptable for testing.  Please recollect.   lonily, k by jolande  Performed at [facility], [place] [address], [place], [place] [address]    Report Status 11/13/2019 FINAL  Final     Radiology Studies: CT ANGIO CHEST PE W OR WO CONTRAST  Result Date: 11/13/2019 CLINICAL DATA:  Pulmonary embolism suspected, high probability. EXAM: CT ANGIOGRAPHY CHEST WITH CONTRAST TECHNIQUE: Multidetector CT imaging of the chest was performed using the standard protocol during bolus administration of intravenous contrast. Multiplanar CT image reconstructions and MIPs were obtained to evaluate the vascular anatomy. CONTRAST:  166mL OMNIPAQUE IOHEXOL 350 MG/ML SOLN COMPARISON:  Chest x-ray from earlier today FINDINGS: Cardiovascular: Satisfactory opacification of the pulmonary arteries to the segmental level. No evidence of pulmonary embolism. Normal heart size. No pericardial effusion. Aortic and coronary atherosclerotic calcification. No acute aortic finding. Mediastinum/Nodes: Negative for adenopathy or mass. Lungs/Pleura: Dense consolidation in the left lower lobe, affecting most of the lower lobe. Mild left upper lobe opacity along the major fissure. 5 mm average diameter right middle lobe pulmonary nodule which is stable from a 2018 abdominal CT and considered benign. Upper Abdomen: Cholecystectomy. Musculoskeletal: Thoracic spine degeneration. No acute or aggressive finding. Review of the MIP images confirms the above findings. IMPRESSION: 1. Pneumonia involving most of the left lower lobe. 2. Negative for pulmonary embolism. 3. Atherosclerosis including the coronary arteries. Aortic Atherosclerosis (ICD10-I70.0). Electronically Signed   By: Monte Fantasia M.D.   On: 11/13/2019 07:41   DG Chest Portable 1 View  Result Date: 11/13/2019 CLINICAL DATA:  Dyspnea and shortness of breath EXAM: PORTABLE CHEST 1 VIEW  COMPARISON:  None. FINDINGS: The heart size and mediastinal contours are within normal limits. Mildly hazy airspace opacity seen within the left lower lung with diffusely increased interstitial markings. Aortic knob calcifications are seen. The visualized skeletal structures are unremarkable. IMPRESSION: Mildly increased airspace opacities within the left lung which could be due to asymmetric edema and/or infectious etiology. Electronically Signed   By: Prudencio Pair M.D.   On: 11/13/2019 00:38   CT ANGIO CHEST PE W OR WO CONTRAST  Final Result    DG Chest Portable 1 View  Final Result       Scheduled Meds: . acetaZOLAMIDE  250 mg Oral Daily  . aspirin  81 mg Oral Daily  . budesonide (PULMICORT) nebulizer solution  0.25 mg Nebulization BID  . enoxaparin (LOVENOX) injection  40 mg Subcutaneous Q24H  . insulin aspart  0-5 Units Subcutaneous QHS  . insulin aspart  0-9 Units Subcutaneous TID WC  . ipratropium-albuterol  3 mL Nebulization BID  . losartan  50 mg Oral Daily  . multivitamin with minerals  1 tablet Oral Daily  . rosuvastatin  20 mg Oral QPC breakfast   PRN Meds: albuterol, ondansetron **OR** ondansetron (ZOFRAN) IV Continuous Infusions: . cefTRIAXone (ROCEPHIN)  IV 2 g (11/14/19 1659)      LOS: 1 day  Time spent: Greater than 50% of the 35 minute visit was spent in counseling/coordination of care for the patient as laid out in the A&P.   Dwyane Dee, MD [facility] 11/14/2019, 5:57 PM   Contact via secure chat.  To contact the attending provider between 7A-7P or the covering provider during after hours 7P-7A, please log into the web site [email or web address] and access using universal [place] password for that web site. If you do not have the password, please call the hospital operator.

## 2019-11-14 NOTE — Assessment & Plan Note (Addendum)
-   admission blood cultures growing 4/4 Strep pna - suspected seeding from lungs - repeat blood cultures on 8/3 considered negative (1/4 noted with CoNS consistent with contaminate) - repeat cultures again on 8/5; will go ahead and obtain TTE to rule out veg as well: no veg appreciated -7-day course of Rocephin completed during hospitalization

## 2019-11-14 NOTE — Hospital Course (Addendum)
Briana Barr is a 79 y.o. female with history of chronic respiratory failure secondary to COPD on 4 L oxygen, hypertension, diabetes mellitus type 2, peripheral vascular disease, previous history of DVT who presented to the ER because of worsening shortness of breath over the last week.  Patient states initially she was having productive cough and has been feeling increasingly weak.  Denies chest pain nausea vomiting or abdominal pain.  Since patient was progressively getting weak patient was brought to the ER.   In the ER patient was short of breath with chest x-ray showing infiltrates concerning for pneumonia versus edema.  Lab work was significant for WBC count of 21,000 lactic acid of 2.2 blood glucose 244 potassium of 3.3 EKG shows tachycardia with nonspecific ST-T changes in the inferior leads.  Patient was started on empiric antibiotics for pneumonia also was started on nebulizer and steroids for possible COPD exacerbation.  Covid test is negative.  Her strep pna urinary Ag was positive and her admission blood cultures also grew 4/4 bottles with Strep Pna. She was placed on CTX on admission for PNA coverage initially as well.  Repeat blood cultures were drawn on 11/14/2019.  Cultures from 8/3 grew 1/4 CoNS considered contaminate. She underwent TTE on 8/6 which was negative for vegetation. Noted mild LVH, EF > 75%, Gr 1 DD.   She was also evaluated by physical therapy during hospitalization due to her weakness and deconditioning.  She also was amenable for placement in rehab at time of discharge when available.  After completing her course of antibiotics, patient and family had changed their mind and she desired to go home with home health instead.  Home health was then arranged at discharge.

## 2019-11-14 NOTE — Assessment & Plan Note (Signed)
-   continue SSI and CBG monitoring  

## 2019-11-15 LAB — CBC WITH DIFFERENTIAL/PLATELET
Abs Immature Granulocytes: 0.46 10*3/uL — ABNORMAL HIGH (ref 0.00–0.07)
Basophils Absolute: 0 10*3/uL (ref 0.0–0.1)
Basophils Relative: 0 %
Eosinophils Absolute: 0.1 10*3/uL (ref 0.0–0.5)
Eosinophils Relative: 1 %
HCT: 41.3 % (ref 36.0–46.0)
Hemoglobin: 12.4 g/dL (ref 12.0–15.0)
Immature Granulocytes: 4 %
Lymphocytes Relative: 8 %
Lymphs Abs: 1 10*3/uL (ref 0.7–4.0)
MCH: 30 pg (ref 26.0–34.0)
MCHC: 30 g/dL (ref 30.0–36.0)
MCV: 99.8 fL (ref 80.0–100.0)
Monocytes Absolute: 1.2 10*3/uL — ABNORMAL HIGH (ref 0.1–1.0)
Monocytes Relative: 10 %
Neutro Abs: 9.3 10*3/uL — ABNORMAL HIGH (ref 1.7–7.7)
Neutrophils Relative %: 77 %
Platelets: 449 10*3/uL — ABNORMAL HIGH (ref 150–400)
RBC: 4.14 MIL/uL (ref 3.87–5.11)
RDW: 14.4 % (ref 11.5–15.5)
WBC: 12.1 10*3/uL — ABNORMAL HIGH (ref 4.0–10.5)
nRBC: 0.2 % (ref 0.0–0.2)

## 2019-11-15 LAB — BASIC METABOLIC PANEL
Anion gap: 9 (ref 5–15)
BUN: 24 mg/dL — ABNORMAL HIGH (ref 8–23)
CO2: 27 mmol/L (ref 22–32)
Calcium: 8.8 mg/dL — ABNORMAL LOW (ref 8.9–10.3)
Chloride: 106 mmol/L (ref 98–111)
Creatinine, Ser: 0.57 mg/dL (ref 0.44–1.00)
GFR calc Af Amer: >60 mL/min (ref >60)
GFR calc non Af Amer: >60 mL/min (ref >60)
Glucose, Bld: 137 mg/dL — ABNORMAL HIGH (ref 70–99)
Potassium: 3.4 mmol/L — ABNORMAL LOW (ref 3.5–5.1)
Sodium: 142 mmol/L (ref 135–145)

## 2019-11-15 LAB — GLUCOSE, CAPILLARY
Glucose-Capillary: 133 mg/dL — ABNORMAL HIGH (ref 70–99)
Glucose-Capillary: 244 mg/dL — ABNORMAL HIGH (ref 70–99)
Glucose-Capillary: 168 mg/dL — ABNORMAL HIGH (ref 70–99)
Glucose-Capillary: 191 mg/dL — ABNORMAL HIGH (ref 70–99)

## 2019-11-15 LAB — CULTURE, BLOOD (ROUTINE X 2)
Special Requests: ADEQUATE
Special Requests: ADEQUATE

## 2019-11-15 LAB — MAGNESIUM: Magnesium: 2.1 mg/dL (ref 1.7–2.4)

## 2019-11-15 MED ORDER — POTASSIUM CHLORIDE CRYS ER 20 MEQ PO TBCR
40.0000 meq | EXTENDED_RELEASE_TABLET | Freq: Once | ORAL | Status: AC
  Administered 2019-11-15: 40 meq via ORAL
  Filled 2019-11-15: qty 2

## 2019-11-15 NOTE — Plan of Care (Signed)
  Problem: Education: Goal: Knowledge of risk factors and measures for prevention of condition will improve Outcome: Progressing   Problem: Respiratory: Goal: Will maintain a patent airway 11/15/2019 [id number] by Diamantina Providence, RN Outcome: Progressing 11/15/2019 2311 by Diamantina Providence, RN Outcome: Progressing

## 2019-11-15 NOTE — Plan of Care (Signed)
  Problem: Respiratory: Goal: Will maintain a patent airway Outcome: Progressing   

## 2019-11-15 NOTE — Evaluation (Signed)
Physical Therapy Evaluation Patient Details Name: Briana Barr MRN: [id number] DOB: December 15, 1940 Today's Date: 11/15/2019   History of Present Illness  79 y.o. female with history of chronic respiratory failure secondary to COPD on 4 L oxygen, hypertension, diabetes mellitus type 2 peripheral vascular disease, previous history of DVT presents to the ER because of worsening shortness of breath over the last week.  Patient states initially she was having productive cough and has been feeling increasingly weak.  Denies chest pain nausea vomiting or abdominal pain.  Since patient was progressively getting weak patient was brought to the ER.  Clinical Impression  Pt admitted with above diagnosis. Pt with generalized weakness and deconditioned, requiring increased supplemental O2 to maintain adequate oxygenation and min assist to mobilize. Pt unable to take steps in room due to weakness and moderate SOB with supine to sit and transfers. Pt well equipped at home and with son moving in to assist, so recommending HHPT. Pt currently with functional limitations due to the deficits listed below (see PT Problem List). Pt will benefit from skilled PT to increase their independence and safety with mobility to allow discharge to the venue listed below.       Follow Up Recommendations Home health PT;Supervision/Assistance - 24 hour    Equipment Recommendations  3in1 (PT)    Recommendations for Other Services       Precautions / Restrictions Precautions Precautions: Fall Precaution Comments: monitor O2 Restrictions Weight Bearing Restrictions: No      Mobility  Bed Mobility Overal bed mobility: Needs Assistance Bed Mobility: Supine to Sit  Supine to sit: Min assist  General bed mobility comments: slow, labored movement, pt prefers to pull on therapist's hand to fully upright trunk and scoot to EOB  Transfers Overall transfer level: Needs assistance Equipment used: Rolling walker (2 wheeled);1 person  hand held assist Transfers: Sit to/from [facility] Sit to Stand: Min assist Stand pivot transfers: Min assist  General transfer comment: initial STS with RW, very unsteady and unable to take steps so returned to sitting EOB; stand pivot bed-BSC-chair with single HHA, min A to power up, cues for hand placement and sequencing with feet, unsteady without LOB  Ambulation/Gait  General Gait Details: attempted to take steps at bedside, limited due to BLE weakness and pt requests to return to sitting, moderate SOB noted  Stairs            Wheelchair Mobility    Modified Rankin (Stroke Patients Only)       Balance Overall balance assessment: Needs assistance Sitting-balance support: Feet supported;Bilateral upper extremity supported Sitting balance-Leahy Scale: Good Sitting balance - Comments: seated EOB   Standing balance support: During functional activity;Single extremity supported Standing balance-Leahy Scale: Poor Standing balance comment: reliant on external support          Pertinent Vitals/Pain Pain Assessment: No/denies pain    Home Living Family/patient expects to be discharged to:: Private residence Living Arrangements: Alone   Type of Home: Mobile home Home Access: Ramped entrance     Home Layout: One level Home Equipment: [place] - 2 wheels;Walker - 4 wheels;Cane - single point;Wheelchair - manual;Hospital bed;Other (comment) (4L O2)      Prior Function Level of Independence: Independent with assistive device(s)         Comments: Pt reports using SPC for household distances and WC for long distances. Son and spouse complete cleaning and provide transportation. Pt reports eldest son is moving in with her.     Hand  Dominance   Dominant Hand: Right    Extremity/Trunk Assessment   Upper Extremity Assessment Upper Extremity Assessment: Overall WFL for tasks assessed    Lower Extremity Assessment Lower Extremity Assessment:  Generalized weakness (AROM WNL, strength grossly 3+/5, denies numbness/tingling)    Cervical / Trunk Assessment Cervical / Trunk Assessment: Normal  Communication   Communication: HOH  Cognition Arousal/Alertness: Awake/alert Behavior During Therapy: WFL for tasks assessed/performed Overall Cognitive Status: Within Functional Limits for tasks assessed         General Comments General comments (skin integrity, edema, etc.): on 5L with SpO2 91% at rest, desats to 82% with mobility and coughing so increased to 6L; once returned to sitting and pursed lip breathing rest break SpO2 >90% so returned to 5L with SpO2 89-90% and RN notified    Exercises     Assessment/Plan    PT Assessment Patient needs continued PT services  PT Problem List Decreased strength;Decreased activity tolerance;Decreased balance;Decreased mobility;Cardiopulmonary status limiting activity       PT Treatment Interventions DME instruction;Gait training;Stair training;Functional mobility training;Therapeutic activities;Therapeutic exercise;Balance training;Patient/family education    PT Goals (Current goals can be found in the Care Plan section)  Acute Rehab PT Goals Patient Stated Goal: home with son to assist PT Goal Formulation: With patient Time For Goal Achievement: 11/22/19 Potential to Achieve Goals: Good    Frequency Min 3X/week   Barriers to discharge        Co-evaluation               AM-PAC PT "6 Clicks" Mobility  Outcome Measure Help needed turning from your back to your side while in a flat bed without using bedrails?: A Little Help needed moving from lying on your back to sitting on the side of a flat bed without using bedrails?: A Little Help needed moving to and from a bed to a chair (including a wheelchair)?: A Little Help needed standing up from a chair using your arms (e.g., wheelchair or bedside chair)?: A Little Help needed to walk in hospital room?: A Lot Help needed climbing  3-5 steps with a railing? : A Lot 6 Click Score: 16    End of Session Equipment Utilized During Treatment: Oxygen Activity Tolerance: Patient limited by fatigue Patient left: in chair;with call bell/phone within reach Nurse Communication: Mobility status;Other (comment) (O2, soiled linen) PT Visit Diagnosis: Unsteadiness on feet (R26.81);Other abnormalities of gait and mobility (R26.89);Muscle weakness (generalized) (M62.81)    Time: 3154-0086 PT Time Calculation (min) (ACUTE ONLY): 36 min   Charges:   PT Evaluation $PT Eval Moderate Complexity: 1 Mod PT Treatments $Therapeutic Activity: 8-22 mins         Tori Shalay Carder PT, DPT 11/15/19, 12:48 PM

## 2019-11-15 NOTE — Progress Notes (Signed)
PROGRESS NOTE    Briana Barr   PIR:[id number]  DOB: [birth year]  DOA: 11/12/2019     2  PCP: [name], MD  CC: weakness  Hospital Course: Briana Barr is a 79 y.o. female with history of chronic respiratory failure secondary to COPD on 4 L oxygen, hypertension, diabetes mellitus type 2, peripheral vascular disease, previous history of DVT who presented to the ER because of worsening shortness of breath over the last week.  Patient states initially she was having productive cough and has been feeling increasingly weak.  Denies chest pain nausea vomiting or abdominal pain.  Since patient was progressively getting weak patient was brought to the ER.   In the ER patient was short of breath with chest x-ray showing infiltrates concerning for pneumonia versus edema.  Lab work was significant for WBC count of 21,000 lactic acid of 2.2 blood glucose 244 potassium of 3.3 EKG shows tachycardia with nonspecific ST-T changes in the inferior leads.  Patient was started on empiric antibiotics for pneumonia also was started on nebulizer and steroids for possible COPD exacerbation.  Covid test is negative.  Her strep pna urinary Ag was positive and her blood cultures also grew 4/4 bottles with Strep Pna. She was placed on CTX on admission for PNA coverage initially as well.  Repeat blood cultures were drawn on 11/14/2019.  She was also evaluated by physical therapy during hospitalization due to her weakness and deconditioning.   Interval History:  No events overnight.  Weakness is improved some this morning.  She was able to work with physical therapy some which is for now recommending home health with PT and 24/7 supervision.  Spoke with patient's son this morning and also updated him and answered questions.  Old records reviewed in assessment of this patient  ROS: Constitutional: positive for fatigue and malaise, Respiratory: positive for cough, Cardiovascular: negative for chest pain and  Gastrointestinal: negative for abdominal pain  Assessment & Plan: Acute respiratory failure with hypoxia ([place]) - patient on chronic O2 at home, 4L - continue O2  COPD (chronic obstructive pulmonary disease) (HCC) - no s/s exacerbation - continue to monitor   Diabetes mellitus type 2 in nonobese (HCC) - continue SSI and CBG monitoring   Essential hypertension - continue current regimen   CAP (community acquired pneumonia) - noted on CXR; also positive strep pna urinary Ag; has also translocated to blood; see bacteremia - continue rocephin  Bacteremia - admission blood cultures growing 4/4 Strep pna - suspected seeding from lungs - repeat blood cultures on 8/3; remain NGTD, continuing to follow - no indication for TTE at this time, but if remains persistently bacteremic will likely order - continue Rocephin    Antimicrobials: Rocephin 11/13/19>> present Azithro 11/13/19 x 1  DVT prophylaxis: Lovenox Code Status: Full Family Communication: none present Disposition Plan:  Status is: Inpatient  Remains inpatient appropriate because:Unsafe d/c plan, IV treatments appropriate due to intensity of illness or inability to take PO and Inpatient level of care appropriate due to severity of illness   Dispo: The patient is from: Home              Anticipated d/c is to: Home              Anticipated d/c date is: > 3 days              Patient currently is not medically stable to d/c.  Objective: Blood pressure 127/72, pulse 88, temperature 98.4 F (36.9  C), temperature source Oral, resp. rate 20, height 5\' 6"  (1.676 m), weight 61.2 kg, SpO2 96 %.  Examination: General appearance: alert, cooperative and fatigued Head: Normocephalic, without obvious abnormality, atraumatic Eyes: EOMI Lungs: coarse breath sounds bilaterally, no obvious wheezing Heart: regular rate and rhythm and S1, S2 normal Abdomen: normal findings: bowel sounds normal and soft, non-tender Extremities: no  edema Skin: mobility and turgor normal Neurologic: Grossly normal   Consultants:   n/a  Procedures:   n/a  Data Reviewed: I have personally reviewed following labs and imaging studies Results for orders placed or performed during the hospital encounter of 11/12/19 (from the past 24 hour(s))  Glucose, capillary     Status: Abnormal   Collection Time: 11/14/19  4:04 PM  Result Value Ref Range   Glucose-Capillary 192 (H) 70 - 99 mg/dL  Glucose, capillary     Status: Abnormal   Collection Time: 11/14/19  9:34 PM  Result Value Ref Range   Glucose-Capillary 244 (H) 70 - 99 mg/dL  Basic metabolic panel     Status: Abnormal   Collection Time: 11/15/19  4:54 AM  Result Value Ref Range   Sodium 142 135 - 145 mmol/L   Potassium 3.4 (L) 3.5 - 5.1 mmol/L   Chloride 106 98 - 111 mmol/L   CO2 27 22 - 32 mmol/L   Glucose, Bld 137 (H) 70 - 99 mg/dL   BUN 24 (H) 8 - 23 mg/dL   Creatinine, Ser 0.57 0.44 - 1.00 mg/dL   Calcium 8.8 (L) 8.9 - 10.3 mg/dL   GFR calc non Af Amer >60 >60 mL/min   GFR calc Af Amer >60 >60 mL/min   Anion gap 9 5 - 15  CBC with Differential/Platelet     Status: Abnormal   Collection Time: 11/15/19  4:54 AM  Result Value Ref Range   WBC 12.1 (H) 4.0 - 10.5 K/uL   RBC 4.14 3.87 - 5.11 MIL/uL   Hemoglobin 12.4 12.0 - 15.0 g/dL   HCT 41.3 36 - 46 %   MCV 99.8 80.0 - 100.0 fL   MCH 30.0 26.0 - 34.0 pg   MCHC 30.0 30.0 - 36.0 g/dL   RDW 14.4 11.5 - 15.5 %   Platelets 449 (H) 150 - 400 K/uL   nRBC 0.2 0.0 - 0.2 %   Neutrophils Relative % 77 %   Neutro Abs 9.3 (H) 1.7 - 7.7 K/uL   Lymphocytes Relative 8 %   Lymphs Abs 1.0 0.7 - 4.0 K/uL   Monocytes Relative 10 %   Monocytes Absolute 1.2 (H) 0 - 1 K/uL   Eosinophils Relative 1 %   Eosinophils Absolute 0.1 0 - 0 K/uL   Basophils Relative 0 %   Basophils Absolute 0.0 0 - 0 K/uL   WBC Morphology MILD LEFT SHIFT (1-5% METAS, OCC MYELO, OCC BANDS)    Immature Granulocytes 4 %   Abs Immature Granulocytes 0.46 (H)  0.00 - 0.07 K/uL  Magnesium     Status: None   Collection Time: 11/15/19  4:54 AM  Result Value Ref Range   Magnesium 2.1 1.7 - 2.4 mg/dL  Glucose, capillary     Status: Abnormal   Collection Time: 11/15/19  8:08 AM  Result Value Ref Range   Glucose-Capillary 133 (H) 70 - 99 mg/dL  Glucose, capillary     Status: Abnormal   Collection Time: 11/15/19 11:59 AM  Result Value Ref Range   Glucose-Capillary 244 (H) 70 - 99 mg/dL  Recent Results (from the past 240 hour(s))  Blood culture (routine x 2)     Status: Abnormal   Collection Time: 11/12/19 11:35 PM   Specimen: BLOOD LEFT FOREARM  Result Value Ref Range Status   Specimen Description   Final    BLOOD LEFT FOREARM Performed at [facility] [address], [place], [place] [address]    Special Requests   Final    BOTTLES DRAWN AEROBIC AND ANAEROBIC Blood Culture adequate volume Performed at [facility] [address], [place], [place] [address]    Culture  Setup Time   Final    GRAM POSITIVE COCCI IN PAIRS IN CHAINS IN BOTH AEROBIC AND ANAEROBIC BOTTLES Organism ID to follow CRITICAL RESULT CALLED TO, READ BACK BY AND VERIFIED WITH: Beatrix Shipper MD @1720  11/13/19 EB Performed at [facility], [place] [address], [place], [place] [address]    Culture STREPTOCOCCUS PNEUMONIAE (A)  Final   Report Status 11/15/2019 FINAL  Final   Organism ID, Bacteria STREPTOCOCCUS PNEUMONIAE  Final      Susceptibility   Streptococcus pneumoniae - MIC*    ERYTHROMYCIN >=8 RESISTANT Resistant     LEVOFLOXACIN 0.5 SENSITIVE Sensitive     VANCOMYCIN 0.5 SENSITIVE Sensitive     PENICILLIN (meningitis) 0.25 RESISTANT Resistant     PENO - penicillin 0.25      PENICILLIN (non-meningitis) 0.25 SENSITIVE Sensitive     PENICILLIN (oral) 0.25 INTERMEDIATE Intermediate     CEFTRIAXONE (non-meningitis) 0.5 SENSITIVE Sensitive     CEFTRIAXONE (meningitis) 0.5 SENSITIVE Sensitive     * STREPTOCOCCUS PNEUMONIAE   Blood Culture ID Panel (Reflexed)     Status: Abnormal   Collection Time: 11/12/19 11:35 PM  Result Value Ref Range Status   Enterococcus species NOT DETECTED NOT DETECTED Final   Listeria monocytogenes NOT DETECTED NOT DETECTED Final   Staphylococcus species NOT DETECTED NOT DETECTED Final   Staphylococcus aureus (BCID) NOT DETECTED NOT DETECTED Final   Streptococcus species DETECTED (A) NOT DETECTED Final    Comment: RESULT CALLED TO, READ BACK BY AND VERIFIED WITH: Beatrix Shipper MD @1720  11/13/19 EB    Streptococcus agalactiae NOT DETECTED NOT DETECTED Final   Streptococcus pneumoniae DETECTED (A) NOT DETECTED Final    Comment: RESULT CALLED TO, READ BACK BY AND VERIFIED WITH: Beatrix Shipper MD @1720  11/13/19 EB    Streptococcus pyogenes NOT DETECTED NOT DETECTED Final   Acinetobacter baumannii NOT DETECTED NOT DETECTED Final   Enterobacteriaceae species NOT DETECTED NOT DETECTED Final   Enterobacter cloacae complex NOT DETECTED NOT DETECTED Final   Escherichia coli NOT DETECTED NOT DETECTED Final   Klebsiella oxytoca NOT DETECTED NOT DETECTED Final   Klebsiella pneumoniae NOT DETECTED NOT DETECTED Final   Proteus species NOT DETECTED NOT DETECTED Final   Serratia marcescens NOT DETECTED NOT DETECTED Final   Haemophilus influenzae NOT DETECTED NOT DETECTED Final   Neisseria meningitidis NOT DETECTED NOT DETECTED Final   Pseudomonas aeruginosa NOT DETECTED NOT DETECTED Final   Candida albicans NOT DETECTED NOT DETECTED Final   Candida glabrata NOT DETECTED NOT DETECTED Final   Candida krusei NOT DETECTED NOT DETECTED Final   Candida parapsilosis NOT DETECTED NOT DETECTED Final   Candida tropicalis NOT DETECTED NOT DETECTED Final    Comment: Performed at [facility], [address] [place], [place] [address]  Blood culture (routine x 2)     Status: Abnormal   Collection Time: 11/12/19 11:40 PM  Specimen: BLOOD  Result Value Ref Range Status   Specimen Description  BLOOD RIGHT ANTECUBITAL  Final   Special Requests   Final    BOTTLES DRAWN AEROBIC AND ANAEROBIC Blood Culture adequate volume   Culture  Setup Time   Final    GRAM POSITIVE COCCI IN PAIRS IN CHAINS IN BOTH AEROBIC AND ANAEROBIC BOTTLES CRITICAL VALUE NOTED.  VALUE IS CONSISTENT WITH PREVIOUSLY REPORTED AND CALLED VALUE.    Culture (A)  Final    STREPTOCOCCUS PNEUMONIAE SUSCEPTIBILITIES PERFORMED ON PREVIOUS CULTURE WITHIN THE LAST 5 DAYS.    Report Status 11/15/2019 FINAL  Final  SARS Coronavirus 2 by RT PCR (hospital order, performed in [facility] hospital lab) Nasopharyngeal Nasopharyngeal Swab     Status: None   Collection Time: 11/13/19  1:00 AM   Specimen: Nasopharyngeal Swab  Result Value Ref Range Status   SARS Coronavirus 2 NEGATIVE NEGATIVE Final    Comment: (NOTE) SARS-CoV-2 target nucleic acids are NOT DETECTED.  The SARS-CoV-2 RNA is generally detectable in upper and lower respiratory specimens during the acute phase of infection. The lowest concentration of SARS-CoV-2 viral copies this assay can detect is 250 copies / mL. A negative result does not preclude SARS-CoV-2 infection and should not be used as the sole basis for treatment or other patient management decisions.  A negative result may occur with improper specimen collection / handling, submission of specimen other than nasopharyngeal swab, presence of viral mutation(s) within the areas targeted by this assay, and inadequate number of viral copies (<250 copies / mL). A negative result must be combined with clinical observations, patient history, and epidemiological information.  Fact Sheet for Patients:   StrictlyIdeas.no  Fact Sheet for Healthcare Providers: BankingDealers.co.za  This test is not yet approved or  cleared by the [place] FDA and has been authorized for detection and/or diagnosis of SARS-CoV-2 by FDA under an Emergency Use Authorization  (EUA).  This EUA will remain in effect (meaning this test can be used) for the duration of the COVID-19 declaration under Section 564(b)(1) of the Act, 21 U.S.C. section 360bbb-3(b)(1), unless the authorization is terminated or revoked sooner.  Performed at [facility], [place] [address] [place], [place] [address]   Urine culture     Status: Abnormal   Collection Time: 11/13/19  4:56 AM   Specimen: Urine, Clean Catch  Result Value Ref Range Status   Specimen Description   Final    URINE, CLEAN CATCH Performed at [facility] [place] [address] [place], [place] [address]    Special Requests   Final    NONE Performed at [facility], [place] [address] [place], [place] [address]    Culture (A)  Final    <10,000 COLONIES/mL INSIGNIFICANT GROWTH Performed at [facility] [address], [place], [place] [address]    Report Status 11/14/2019 FINAL  Final  Culture, sputum-assessment     Status: None   Collection Time: 11/13/19  7:51 AM   Specimen: Expectorated Sputum  Result Value Ref Range Status   Specimen Description EXPECTORATED SPUTUM  Final   Special Requests NONE  Final   Sputum evaluation   Final    Sputum specimen not acceptable for testing.  Please recollect.   lonily, k by jolande  Performed at [facility], [place] [address] [place], [place] [address]    Report Status 11/13/2019 FINAL  Final  Culture, blood (routine x 2)  Status: None (Preliminary result)   Collection Time: 11/14/19 11:50 AM   Specimen: BLOOD LEFT HAND  Result Value Ref Range Status   Specimen Description   Final    BLOOD LEFT HAND Performed at [facility] [address], [place], [place] [address]    Special Requests   Final    BOTTLES DRAWN AEROBIC AND ANAEROBIC Blood Culture adequate volume Performed at [facility] [address] [place], [place] [address]    Culture    Final    NO GROWTH < 24 HOURS Performed at [facility] [address] [place], [place] [address]    Report Status PENDING  Incomplete  Culture, blood (routine x 2)     Status: None (Preliminary result)   Collection Time: 11/14/19 11:56 AM   Specimen: BLOOD  Result Value Ref Range Status   Specimen Description   Final    BLOOD BLOOD RIGHT FOREARM Performed at [facility] [address] [place], [place] [address]    Special Requests   Final    BOTTLES DRAWN AEROBIC AND ANAEROBIC Blood Culture adequate volume Performed at [facility] [address] [place], [place] [address]    Culture   Final    NO GROWTH < 24 HOURS Performed at [facility] [address], [place], [place] [address]    Report Status PENDING  Incomplete     Radiology Studies: No results found. CT ANGIO CHEST PE W OR WO CONTRAST  Final Result    DG Chest Portable 1 View  Final Result       Scheduled Meds: . acetaZOLAMIDE  250 mg Oral Daily  . aspirin  81 mg Oral Daily  . budesonide (PULMICORT) nebulizer solution  0.25 mg Nebulization BID  . enoxaparin (LOVENOX) injection  40 mg Subcutaneous Q24H  . insulin aspart  0-5 Units Subcutaneous QHS  . insulin aspart  0-9 Units Subcutaneous TID WC  . ipratropium-albuterol  3 mL Nebulization BID  . losartan  50 mg Oral Daily  . multivitamin with minerals  1 tablet Oral Daily  . rosuvastatin  20 mg Oral QPC breakfast   PRN Meds: albuterol, ondansetron **OR** ondansetron (ZOFRAN) IV Continuous Infusions: . cefTRIAXone (ROCEPHIN)  IV 2 g (11/14/19 1659)      LOS: 2 days  Time spent: Greater than 50% of the 35 minute visit was spent in counseling/coordination of care for the patient as laid out in the A&P.   Dwyane Dee, MD [facility] 11/15/2019, 3:20 PM   Contact via secure chat.  To contact the attending provider between 7A-7P or the covering provider during after hours 7P-7A, please  log into the web site [email or web address] and access using universal [place] password for that web site. If you do not have the password, please call the hospital operator.

## 2019-11-16 LAB — BASIC METABOLIC PANEL
Anion gap: 8 (ref 5–15)
BUN: 24 mg/dL — ABNORMAL HIGH (ref 8–23)
CO2: 27 mmol/L (ref 22–32)
Calcium: 8.9 mg/dL (ref 8.9–10.3)
Chloride: 106 mmol/L (ref 98–111)
Creatinine, Ser: 0.7 mg/dL (ref 0.44–1.00)
GFR calc Af Amer: >60 mL/min (ref >60)
GFR calc non Af Amer: >60 mL/min (ref >60)
Glucose, Bld: 156 mg/dL — ABNORMAL HIGH (ref 70–99)
Potassium: 4.3 mmol/L (ref 3.5–5.1)
Sodium: 141 mmol/L (ref 135–145)

## 2019-11-16 LAB — CBC WITH DIFFERENTIAL/PLATELET
Abs Immature Granulocytes: 0.75 10*3/uL — ABNORMAL HIGH (ref 0.00–0.07)
Basophils Absolute: 0 10*3/uL (ref 0.0–0.1)
Basophils Relative: 0 %
Eosinophils Absolute: 0.2 10*3/uL (ref 0.0–0.5)
Eosinophils Relative: 2 %
HCT: 44.9 % (ref 36.0–46.0)
Hemoglobin: 13.3 g/dL (ref 12.0–15.0)
Immature Granulocytes: 6 %
Lymphocytes Relative: 9 %
Lymphs Abs: 1.1 10*3/uL (ref 0.7–4.0)
MCH: 30 pg (ref 26.0–34.0)
MCHC: 29.6 g/dL — ABNORMAL LOW (ref 30.0–36.0)
MCV: 101.1 fL — ABNORMAL HIGH (ref 80.0–100.0)
Monocytes Absolute: 1.5 10*3/uL — ABNORMAL HIGH (ref 0.1–1.0)
Monocytes Relative: 12 %
Neutro Abs: 8.4 10*3/uL — ABNORMAL HIGH (ref 1.7–7.7)
Neutrophils Relative %: 71 %
Platelets: 474 10*3/uL — ABNORMAL HIGH (ref 150–400)
RBC: 4.44 MIL/uL (ref 3.87–5.11)
RDW: 14.4 % (ref 11.5–15.5)
WBC: 11.9 10*3/uL — ABNORMAL HIGH (ref 4.0–10.5)
nRBC: 0.2 % (ref 0.0–0.2)

## 2019-11-16 LAB — GLUCOSE, CAPILLARY
Glucose-Capillary: 161 mg/dL — ABNORMAL HIGH (ref 70–99)
Glucose-Capillary: 243 mg/dL — ABNORMAL HIGH (ref 70–99)
Glucose-Capillary: 173 mg/dL — ABNORMAL HIGH (ref 70–99)
Glucose-Capillary: 179 mg/dL — ABNORMAL HIGH (ref 70–99)

## 2019-11-16 LAB — MAGNESIUM: Magnesium: 2.3 mg/dL (ref 1.7–2.4)

## 2019-11-16 NOTE — Progress Notes (Signed)
PROGRESS NOTE    Briana Barr   TKW:[id number]  DOB: [birth year]  DOA: 11/12/2019     3  PCP: [name], MD  CC: weakness  Hospital Course: Briana Barr is a 79 y.o. female with history of chronic respiratory failure secondary to COPD on 4 L oxygen, hypertension, diabetes mellitus type 2, peripheral vascular disease, previous history of DVT who presented to the ER because of worsening shortness of breath over the last week.  Patient states initially she was having productive cough and has been feeling increasingly weak.  Denies chest pain nausea vomiting or abdominal pain.  Since patient was progressively getting weak patient was brought to the ER.   In the ER patient was short of breath with chest x-ray showing infiltrates concerning for pneumonia versus edema.  Lab work was significant for WBC count of 21,000 lactic acid of 2.2 blood glucose 244 potassium of 3.3 EKG shows tachycardia with nonspecific ST-T changes in the inferior leads.  Patient was started on empiric antibiotics for pneumonia also was started on nebulizer and steroids for possible COPD exacerbation.  Covid test is negative.  Her strep pna urinary Ag was positive and her blood cultures also grew 4/4 bottles with Strep Pna. She was placed on CTX on admission for PNA coverage initially as well.  Repeat blood cultures were drawn on 11/14/2019.  She was also evaluated by physical therapy during hospitalization due to her weakness and deconditioning.   Interval History:  No events overnight.  Weakness seems to be improving a little each day.  She was amenable for going to rehab at time of discharge after this is pursued.  Old records reviewed in assessment of this patient  ROS: Constitutional: positive for fatigue and malaise, Respiratory: positive for cough, Cardiovascular: negative for chest pain and Gastrointestinal: negative for abdominal pain  Assessment & Plan: Acute respiratory failure with hypoxia ([place]) -  patient on chronic O2 at home, 4L - continue O2  COPD (chronic obstructive pulmonary disease) (HCC) - no s/s exacerbation - continue to monitor   Diabetes mellitus type 2 in nonobese (HCC) - continue SSI and CBG monitoring   Essential hypertension - continue current regimen   CAP (community acquired pneumonia) - noted on CXR; also positive strep pna urinary Ag; has also translocated to blood; see bacteremia - continue rocephin  Bacteremia - admission blood cultures growing 4/4 Strep pna - suspected seeding from lungs - repeat blood cultures on 8/3 started growing on day 2 (1/4 positive for GPC, follow for speciation) - repeat cultures again on 8/5; will go ahead and obtain TTE to rule out veg as well - continue Rocephin    Antimicrobials: Rocephin 11/13/19>> present Azithro 11/13/19 x 1  DVT prophylaxis: Lovenox Code Status: Full Family Communication: none present Disposition Plan:  Status is: Inpatient  Remains inpatient appropriate because:Unsafe d/c plan, IV treatments appropriate due to intensity of illness or inability to take PO and Inpatient level of care appropriate due to severity of illness   Dispo: The patient is from: Home              Anticipated d/c is to: Rehab              Anticipated d/c date is: > 3 days              Patient currently is not medically stable to d/c.  Objective: Blood pressure 119/76, pulse 86, temperature 97.7 F (36.5 C), temperature source Oral, resp. rate (!) 22,  height 5\' 6"  (1.676 m), weight 61.2 kg, SpO2 90 %.  Examination: General appearance: alert, cooperative and fatigued Head: Normocephalic, without obvious abnormality, atraumatic Eyes: EOMI Lungs: coarse breath sounds bilaterally, no obvious wheezing Heart: regular rate and rhythm and S1, S2 normal Abdomen: normal findings: bowel sounds normal and soft, non-tender Extremities: no edema Skin: mobility and turgor normal Neurologic: Grossly normal   Consultants:    n/a  Procedures:   n/a  Data Reviewed: I have personally reviewed following labs and imaging studies Results for orders placed or performed during the hospital encounter of 11/12/19 (from the past 24 hour(s))  Glucose, capillary     Status: Abnormal   Collection Time: 11/15/19  4:18 PM  Result Value Ref Range   Glucose-Capillary 168 (H) 70 - 99 mg/dL  Glucose, capillary     Status: Abnormal   Collection Time: 11/15/19 10:06 PM  Result Value Ref Range   Glucose-Capillary 191 (H) 70 - 99 mg/dL  Basic metabolic panel     Status: Abnormal   Collection Time: 11/16/19  4:17 AM  Result Value Ref Range   Sodium 141 135 - 145 mmol/L   Potassium 4.3 3.5 - 5.1 mmol/L   Chloride 106 98 - 111 mmol/L   CO2 27 22 - 32 mmol/L   Glucose, Bld 156 (H) 70 - 99 mg/dL   BUN 24 (H) 8 - 23 mg/dL   Creatinine, Ser 0.70 0.44 - 1.00 mg/dL   Calcium 8.9 8.9 - 10.3 mg/dL   GFR calc non Af Amer >60 >60 mL/min   GFR calc Af Amer >60 >60 mL/min   Anion gap 8 5 - 15  CBC with Differential/Platelet     Status: Abnormal   Collection Time: 11/16/19  4:17 AM  Result Value Ref Range   WBC 11.9 (H) 4.0 - 10.5 K/uL   RBC 4.44 3.87 - 5.11 MIL/uL   Hemoglobin 13.3 12.0 - 15.0 g/dL   HCT 44.9 36 - 46 %   MCV 101.1 (H) 80.0 - 100.0 fL   MCH 30.0 26.0 - 34.0 pg   MCHC 29.6 (L) 30.0 - 36.0 g/dL   RDW 14.4 11.5 - 15.5 %   Platelets 474 (H) 150 - 400 K/uL   nRBC 0.2 0.0 - 0.2 %   Neutrophils Relative % 71 %   Neutro Abs 8.4 (H) 1.7 - 7.7 K/uL   Lymphocytes Relative 9 %   Lymphs Abs 1.1 0.7 - 4.0 K/uL   Monocytes Relative 12 %   Monocytes Absolute 1.5 (H) 0 - 1 K/uL   Eosinophils Relative 2 %   Eosinophils Absolute 0.2 0 - 0 K/uL   Basophils Relative 0 %   Basophils Absolute 0.0 0 - 0 K/uL   WBC Morphology TOXIC GRANULATION    Immature Granulocytes 6 %   Abs Immature Granulocytes 0.75 (H) 0.00 - 0.07 K/uL  Magnesium     Status: None   Collection Time: 11/16/19  4:17 AM  Result Value Ref Range    Magnesium 2.3 1.7 - 2.4 mg/dL  Glucose, capillary     Status: Abnormal   Collection Time: 11/16/19  7:47 AM  Result Value Ref Range   Glucose-Capillary 161 (H) 70 - 99 mg/dL  Glucose, capillary     Status: Abnormal   Collection Time: 11/16/19 11:46 AM  Result Value Ref Range   Glucose-Capillary 243 (H) 70 - 99 mg/dL    Recent Results (from the past 240 hour(s))  Blood culture (routine x 2)  Status: Abnormal   Collection Time: 11/12/19 11:35 PM   Specimen: BLOOD LEFT FOREARM  Result Value Ref Range Status   Specimen Description   Final    BLOOD LEFT FOREARM Performed at [facility] [address] [place], [place] [address]    Special Requests   Final    BOTTLES DRAWN AEROBIC AND ANAEROBIC Blood Culture adequate volume Performed at [facility] [address] [place], [place] [address]    Culture  Setup Time   Final    GRAM POSITIVE COCCI IN PAIRS IN CHAINS IN BOTH AEROBIC AND ANAEROBIC BOTTLES Organism ID to follow CRITICAL RESULT CALLED TO, READ BACK BY AND VERIFIED WITH: Beatrix Shipper MD @1720  11/13/19 EB Performed at [facility], [place] [address] [place], [place] [address]    Culture STREPTOCOCCUS PNEUMONIAE (A)  Final   Report Status 11/15/2019 FINAL  Final   Organism ID, Bacteria STREPTOCOCCUS PNEUMONIAE  Final      Susceptibility   Streptococcus pneumoniae - MIC*    ERYTHROMYCIN >=8 RESISTANT Resistant     LEVOFLOXACIN 0.5 SENSITIVE Sensitive     VANCOMYCIN 0.5 SENSITIVE Sensitive     PENICILLIN (meningitis) 0.25 RESISTANT Resistant     PENO - penicillin 0.25      PENICILLIN (non-meningitis) 0.25 SENSITIVE Sensitive     PENICILLIN (oral) 0.25 INTERMEDIATE Intermediate     CEFTRIAXONE (non-meningitis) 0.5 SENSITIVE Sensitive     CEFTRIAXONE (meningitis) 0.5 SENSITIVE Sensitive     * STREPTOCOCCUS PNEUMONIAE  Blood Culture ID Panel (Reflexed)     Status: Abnormal   Collection Time: 11/12/19 11:35 PM  Result Value  Ref Range Status   Enterococcus species NOT DETECTED NOT DETECTED Final   Listeria monocytogenes NOT DETECTED NOT DETECTED Final   Staphylococcus species NOT DETECTED NOT DETECTED Final   Staphylococcus aureus (BCID) NOT DETECTED NOT DETECTED Final   Streptococcus species DETECTED (A) NOT DETECTED Final    Comment: RESULT CALLED TO, READ BACK BY AND VERIFIED WITH: Beatrix Shipper MD @1720  11/13/19 EB    Streptococcus agalactiae NOT DETECTED NOT DETECTED Final   Streptococcus pneumoniae DETECTED (A) NOT DETECTED Final    Comment: RESULT CALLED TO, READ BACK BY AND VERIFIED WITH: Beatrix Shipper MD @1720  11/13/19 EB    Streptococcus pyogenes NOT DETECTED NOT DETECTED Final   Acinetobacter baumannii NOT DETECTED NOT DETECTED Final   Enterobacteriaceae species NOT DETECTED NOT DETECTED Final   Enterobacter cloacae complex NOT DETECTED NOT DETECTED Final   Escherichia coli NOT DETECTED NOT DETECTED Final   Klebsiella oxytoca NOT DETECTED NOT DETECTED Final   Klebsiella pneumoniae NOT DETECTED NOT DETECTED Final   Proteus species NOT DETECTED NOT DETECTED Final   Serratia marcescens NOT DETECTED NOT DETECTED Final   Haemophilus influenzae NOT DETECTED NOT DETECTED Final   Neisseria meningitidis NOT DETECTED NOT DETECTED Final   Pseudomonas aeruginosa NOT DETECTED NOT DETECTED Final   Candida albicans NOT DETECTED NOT DETECTED Final   Candida glabrata NOT DETECTED NOT DETECTED Final   Candida krusei NOT DETECTED NOT DETECTED Final   Candida parapsilosis NOT DETECTED NOT DETECTED Final   Candida tropicalis NOT DETECTED NOT DETECTED Final    Comment: Performed at [facility], [address] [place], [place] [address]  Blood culture (routine x 2)     Status: Abnormal   Collection Time: 11/12/19 11:40 PM   Specimen: BLOOD  Result Value Ref Range Status   Specimen Description BLOOD RIGHT ANTECUBITAL  Final  Special Requests   Final    BOTTLES DRAWN AEROBIC AND ANAEROBIC Blood Culture  adequate volume   Culture  Setup Time   Final    GRAM POSITIVE COCCI IN PAIRS IN CHAINS IN BOTH AEROBIC AND ANAEROBIC BOTTLES CRITICAL VALUE NOTED.  VALUE IS CONSISTENT WITH PREVIOUSLY REPORTED AND CALLED VALUE.    Culture (A)  Final    STREPTOCOCCUS PNEUMONIAE SUSCEPTIBILITIES PERFORMED ON PREVIOUS CULTURE WITHIN THE LAST 5 DAYS.    Report Status 11/15/2019 FINAL  Final  SARS Coronavirus 2 by RT PCR (hospital order, performed in [facility] hospital lab) Nasopharyngeal Nasopharyngeal Swab     Status: None   Collection Time: 11/13/19  1:00 AM   Specimen: Nasopharyngeal Swab  Result Value Ref Range Status   SARS Coronavirus 2 NEGATIVE NEGATIVE Final    Comment: (NOTE) SARS-CoV-2 target nucleic acids are NOT DETECTED.  The SARS-CoV-2 RNA is generally detectable in upper and lower respiratory specimens during the acute phase of infection. The lowest concentration of SARS-CoV-2 viral copies this assay can detect is 250 copies / mL. A negative result does not preclude SARS-CoV-2 infection and should not be used as the sole basis for treatment or other patient management decisions.  A negative result may occur with improper specimen collection / handling, submission of specimen other than nasopharyngeal swab, presence of viral mutation(s) within the areas targeted by this assay, and inadequate number of viral copies (<250 copies / mL). A negative result must be combined with clinical observations, patient history, and epidemiological information.  Fact Sheet for Patients:   StrictlyIdeas.no  Fact Sheet for Healthcare Providers: BankingDealers.co.za  This test is not yet approved or  cleared by the [place] FDA and has been authorized for detection and/or diagnosis of SARS-CoV-2 by FDA under an Emergency Use Authorization (EUA).  This EUA will remain in effect (meaning this test can be used) for the duration of the COVID-19  declaration under Section 564(b)(1) of the Act, 21 U.S.C. section 360bbb-3(b)(1), unless the authorization is terminated or revoked sooner.  Performed at [facility], [place] [address] [place], [place] [address]   Urine culture     Status: Abnormal   Collection Time: 11/13/19  4:56 AM   Specimen: Urine, Clean Catch  Result Value Ref Range Status   Specimen Description   Final    URINE, CLEAN CATCH Performed at [facility], [place] [address], [place], [place] [address]    Special Requests   Final    NONE Performed at [facility], [place] [address] [place], [place] [address]    Culture (A)  Final    <10,000 COLONIES/mL INSIGNIFICANT GROWTH Performed at [facility] [address] [place], [place] [address]    Report Status 11/14/2019 FINAL  Final  Culture, sputum-assessment     Status: None   Collection Time: 11/13/19  7:51 AM   Specimen: Expectorated Sputum  Result Value Ref Range Status   Specimen Description EXPECTORATED SPUTUM  Final   Special Requests NONE  Final   Sputum evaluation   Final    Sputum specimen not acceptable for testing.  Please recollect.   lonily, k by jolande  Performed at [facility], [place] [address], [place], [place] [address]    Report Status 11/13/2019 FINAL  Final  Culture, blood (routine x 2)     Status: None (Preliminary result)   Collection Time: 11/14/19 11:50 AM   Specimen: BLOOD LEFT HAND  Result Value Ref Range Status   Specimen Description   Final    BLOOD LEFT HAND Performed at [facility] [address], [place], [place] [address]    Special Requests   Final    BOTTLES DRAWN AEROBIC AND ANAEROBIC Blood Culture adequate volume Performed at [facility] [address], [place], [place] [address]    Culture   Final    NO GROWTH 2 DAYS Performed at [facility] [address], [place], [place] [address]     Report Status PENDING  Incomplete  Culture, blood (routine x 2)     Status: None (Preliminary result)   Collection Time: 11/14/19 11:56 AM   Specimen: BLOOD  Result Value Ref Range Status   Specimen Description   Final    BLOOD BLOOD RIGHT FOREARM Performed at [facility] [address] [place], [place] [address]    Special Requests   Final    BOTTLES DRAWN AEROBIC AND ANAEROBIC Blood Culture adequate volume Performed at [facility] [address] [place], [place] [address]    Culture  Setup Time   Final    GRAM POSITIVE COCCI AEROBIC BOTTLE ONLY CRITICAL VALUE NOTED.  VALUE IS CONSISTENT WITH PREVIOUSLY REPORTED AND CALLED VALUE. Performed at [facility], [place] [address], [place], [place] [address]    Culture GRAM POSITIVE COCCI  Final   Report Status PENDING  Incomplete     Radiology Studies: No results found. CT ANGIO CHEST PE W OR WO CONTRAST  Final Result    DG Chest Portable 1 View  Final Result       Scheduled Meds:  acetaZOLAMIDE  250 mg Oral Daily   aspirin  81 mg Oral Daily   budesonide (PULMICORT) nebulizer solution  0.25 mg Nebulization BID   enoxaparin (LOVENOX) injection  40 mg Subcutaneous Q24H   insulin aspart  0-5 Units Subcutaneous QHS   insulin aspart  0-9 Units Subcutaneous TID WC   ipratropium-albuterol  3 mL Nebulization BID   losartan  50 mg Oral Daily   multivitamin with minerals  1 tablet Oral Daily   rosuvastatin  20 mg Oral QPC breakfast   PRN Meds: albuterol, ondansetron **OR** ondansetron (ZOFRAN) IV Continuous Infusions:  cefTRIAXone (ROCEPHIN)  IV 2 g (11/15/19 1708)      LOS: 3 days  Time spent: Greater than 50% of the 35 minute visit was spent in counseling/coordination of care for the patient as laid out in the A&P.   Dwyane Dee, MD [facility] 11/16/2019, 2:41 PM   Contact via secure chat.  To contact the attending provider between 7A-7P or the covering  provider during after hours 7P-7A, please log into the web site [email or web address] and access using universal [place] password for that web site. If you do not have the password, please call the hospital operator.

## 2019-11-16 NOTE — TOC Initial Note (Signed)
Transition of Care [facility]) - Initial/Assessment Note    Patient Details  Name: Briana Barr MRN: [id number] Date of Birth: Sep 13, 1940  [facility] of [facility]) CM/SW Contact:    Ross Ludwig, LCSW Phone Number: 11/16/2019, 6:28 PM  Clinical Narrative:                  Patient is a 79 year old female who is alert and oriented x4.  Patient informed physician that she thinks she needs SNF placement for short term rehab.  CSW was unable to speak to patient due to her being sleepy.  CSW completed assessment by reviewing chart.  Patient lives alone, has not been to rehab in the past.  Patient's son is also requesting SNF placement for patient.  CSW began bed search in [place].  Bed search started via patient's request for rehab.   Expected Discharge Plan: Skilled Nursing Facility Barriers to Discharge: SNF Pending bed offer, Continued Medical Work up   Patient Goals and CMS Choice Patient states their goals for this hospitalization and ongoing recovery are:: To go to SNF for short term rehab, then return back home with home health.      Expected Discharge Plan and Services Expected Discharge Plan: [place] In-house Referral: NA Discharge Planning Services: NA Post Acute Care Choice: [place] Living arrangements for the past 2 months: Single Family Home                   DME Agency: NA                  Prior Living Arrangements/Services Living arrangements for the past 2 months: Single Family Home Lives with:: Self Patient language and need for interpreter reviewed:: Yes Do you feel safe going back to the place where you live?: No   Patient feels she needs some rehab before returning back home.  Need for Family Participation in Patient Care: Yes (Comment) Care giver support system in place?: No (comment)   Criminal Activity/Legal Involvement Pertinent to Current Situation/Hospitalization: No - Comment as needed  Activities of  Daily Living Home Assistive Devices/Equipment: Cane (specify quad or straight), Eyeglasses, Nebulizer (single point cane) ADL Screening (condition at time of admission) Patient's cognitive ability adequate to safely complete daily activities?: Yes Is the patient deaf or have difficulty hearing?: No Does the patient have difficulty seeing, even when wearing glasses/contacts?: No Does the patient have difficulty concentrating, remembering, or making decisions?: No Patient able to express need for assistance with ADLs?: Yes Does the patient have difficulty dressing or bathing?: No Independently performs ADLs?: Yes (appropriate for developmental age) (uses a cane) Does the patient have difficulty walking or climbing stairs?: Yes Weakness of Legs: Both Weakness of Arms/Hands: None  Permission Sought/Granted Permission sought to share information with : Facility Sport and exercise psychologist, Family Supports Permission granted to share information with : Yes, Verbal Permission Granted, Yes, Release of Information Signed  Share Information with NAME: Pell,Michael Son   [phone]  Permission granted to share info w AGENCY: SNF admissions        Emotional Assessment Appearance:: Appears stated age Attitude/Demeanor/Rapport: Engaged Affect (typically observed): Accepting, Hopeful, Appropriate, Calm Orientation: : Oriented to Self, Oriented to Place, Oriented to  Time, Oriented to Situation Alcohol / Substance Use: Not Applicable Psych Involvement: No (comment)  Admission diagnosis:  Acute respiratory failure with hypoxia ([place]) [J96.01] Community acquired pneumonia of left lower lobe of lung [J18.9] Patient Active Problem List   Diagnosis  Date Noted  . Bacteremia 11/14/2019  . Acute respiratory failure with hypoxia ([place]) 11/13/2019  . COPD (chronic obstructive pulmonary disease) ([place]) 11/13/2019  . Diabetes mellitus type 2 in nonobese ([place]) 11/13/2019  . Essential hypertension 11/13/2019  .  Community acquired pneumonia of left lower lobe of lung   . Abnormal CT scan, colon   . Benign neoplasm of sigmoid colon   . Benign neoplasm of rectum   . Loss of weight   . Epigastric abdominal tenderness without rebound tenderness   . Anorexia    PCP:  [name], MD Pharmacy:   [id number] [place] - [address] [place] [address] Phone: [phone] Fax: [phone]     Social Determinants of Health (SDOH) Interventions    Readmission Risk Interventions No flowsheet data found.

## 2019-11-17 ENCOUNTER — Inpatient Hospital Stay (HOSPITAL_COMMUNITY): Payer: Medicare Other

## 2019-11-17 DIAGNOSIS — R7881 Bacteremia: Secondary | ICD-10-CM

## 2019-11-17 LAB — CBC WITH DIFFERENTIAL/PLATELET
Abs Immature Granulocytes: 1.31 10*3/uL — ABNORMAL HIGH (ref 0.00–0.07)
Basophils Absolute: 0 10*3/uL (ref 0.0–0.1)
Basophils Relative: 0 %
Eosinophils Absolute: 0.3 10*3/uL (ref 0.0–0.5)
Eosinophils Relative: 2 %
HCT: 42 % (ref 36.0–46.0)
Hemoglobin: 12.6 g/dL (ref 12.0–15.0)
Immature Granulocytes: 9 %
Lymphocytes Relative: 9 %
Lymphs Abs: 1.4 10*3/uL (ref 0.7–4.0)
MCH: 29.9 pg (ref 26.0–34.0)
MCHC: 30 g/dL (ref 30.0–36.0)
MCV: 99.5 fL (ref 80.0–100.0)
Monocytes Absolute: 1.6 10*3/uL — ABNORMAL HIGH (ref 0.1–1.0)
Monocytes Relative: 11 %
Neutro Abs: 10.7 10*3/uL — ABNORMAL HIGH (ref 1.7–7.7)
Neutrophils Relative %: 69 %
Platelets: 497 10*3/uL — ABNORMAL HIGH (ref 150–400)
RBC: 4.22 MIL/uL (ref 3.87–5.11)
RDW: 14.2 % (ref 11.5–15.5)
WBC: 15.3 10*3/uL — ABNORMAL HIGH (ref 4.0–10.5)
nRBC: 0.1 % (ref 0.0–0.2)

## 2019-11-17 LAB — BASIC METABOLIC PANEL
Anion gap: 9 (ref 5–15)
BUN: 27 mg/dL — ABNORMAL HIGH (ref 8–23)
CO2: 30 mmol/L (ref 22–32)
Calcium: 9.2 mg/dL (ref 8.9–10.3)
Chloride: 98 mmol/L (ref 98–111)
Creatinine, Ser: 0.61 mg/dL (ref 0.44–1.00)
GFR calc Af Amer: >60 mL/min (ref >60)
GFR calc non Af Amer: >60 mL/min (ref >60)
Glucose, Bld: 257 mg/dL — ABNORMAL HIGH (ref 70–99)
Potassium: 3.7 mmol/L (ref 3.5–5.1)
Sodium: 137 mmol/L (ref 135–145)

## 2019-11-17 LAB — GLUCOSE, CAPILLARY
Glucose-Capillary: 167 mg/dL — ABNORMAL HIGH (ref 70–99)
Glucose-Capillary: 224 mg/dL — ABNORMAL HIGH (ref 70–99)
Glucose-Capillary: 115 mg/dL — ABNORMAL HIGH (ref 70–99)
Glucose-Capillary: 246 mg/dL — ABNORMAL HIGH (ref 70–99)

## 2019-11-17 LAB — ECHOCARDIOGRAM COMPLETE
Area-P 1/2: 3.74 cm2
Height: 66 in
S' Lateral: 2.3 cm
Weight: 2160 oz

## 2019-11-17 LAB — MAGNESIUM: Magnesium: 2.1 mg/dL (ref 1.7–2.4)

## 2019-11-17 NOTE — Progress Notes (Signed)
Physical Therapy Treatment Patient Details Name: Briana Barr MRN: [id number] DOB: 1941-03-08 Today's Date: 11/17/2019    History of Present Illness 79 y.o. female with history of chronic respiratory failure secondary to COPD on 4 L oxygen, hypertension, diabetes mellitus type 2 peripheral vascular disease, previous history of DVT presents to the ER because of worsening shortness of breath over the last week.  Patient states initially she was having productive cough and has been feeling increasingly weak.  Denies chest pain nausea vomiting or abdominal pain.  Since patient was progressively getting weak patient was brought to the ER.    PT Comments    Pt in bed on 5 lts nasal at 94%.  Wear oxygen at home "for years" stated pt.  Lives home alone but no longer drives.   Assisted OOB to amb to bathroom.  General Gait Details: pt was only able to tolerated amb in room to and from bathroom due to fatigue and 3/4 dyspnea.  Sats decreased to low 80's.  Assisted in bathroom with hygiene and static standing at sink x 3 min to brush teeth completely fatigued pt.  Unable to amb in hallway assisted to recliner.    Follow Up Recommendations  Home health PT;Supervision/Assistance - 24 hour     Equipment Recommendations  3in1 (PT)    Recommendations for Other Services       Precautions / Restrictions Precautions Precautions: Fall Precaution Comments: HOME O2 Restrictions Weight Bearing Restrictions: No    Mobility  Bed Mobility Overal bed mobility: Needs Assistance Bed Mobility: Supine to Sit     Supine to sit: Supervision;Min guard     General bed mobility comments: increased time but self able  Transfers Overall transfer level: Needs assistance Equipment used: Rolling walker (2 wheeled) Transfers: Sit to/from [facility] Sit to Stand: Supervision Stand pivot transfers: Min guard;Supervision       General transfer comment: assisted OOB with one VC to avoid pulling  up on walker.  Also assisted with a toilet tranfer.  Pt self able at Supervision level.  Ambulation/Gait Ambulation/Gait assistance: Supervision;Min guard Gait Distance (Feet): 25 Feet Assistive device: Rolling walker (2 wheeled) Gait Pattern/deviations: Step-through pattern;Decreased stride length Gait velocity: decreased   General Gait Details: pt was only able to tolerated amb in room to and from bathroom due to fatigue and 3/4 dyspnea   Stairs             Wheelchair Mobility    Modified Rankin (Stroke Patients Only)       Balance                                            Cognition Arousal/Alertness: Awake/alert Behavior During Therapy: WFL for tasks assessed/performed Overall Cognitive Status: Within Functional Limits for tasks assessed                                 General Comments: AxO x 3 very pleasant      Exercises      General Comments        Pertinent Vitals/Pain Pain Assessment: No/denies pain    Home Living                      Prior Function  PT Goals (current goals can now be found in the care plan section) Progress towards PT goals: Progressing toward goals    Frequency    Min 3X/week      PT Plan Current plan remains appropriate    Co-evaluation              AM-PAC PT "6 Clicks" Mobility   Outcome Measure  Help needed turning from your back to your side while in a flat bed without using bedrails?: A Little Help needed moving from lying on your back to sitting on the side of a flat bed without using bedrails?: A Little Help needed moving to and from a bed to a chair (including a wheelchair)?: A Little Help needed standing up from a chair using your arms (e.g., wheelchair or bedside chair)?: A Little Help needed to walk in hospital room?: A Little Help needed climbing 3-5 steps with a railing? : A Little 6 Click Score: 18    End of Session Equipment Utilized  During Treatment: Oxygen;Gait belt Activity Tolerance: Patient limited by fatigue Patient left: in chair;with call bell/phone within reach Nurse Communication: Mobility status PT Visit Diagnosis: Unsteadiness on feet (R26.81);Other abnormalities of gait and mobility (R26.89);Muscle weakness (generalized) (M62.81)     Time: 1130-1200 PT Time Calculation (min) (ACUTE ONLY): 30 min  Charges:  $Gait Training: 8-22 mins $Therapeutic Activity: 8-22 mins                     Rica Koyanagi  PTA Acute  Rehabilitation Services Pager      [phone] Office      [phone]

## 2019-11-17 NOTE — Progress Notes (Signed)
PROGRESS NOTE    Briana Barr   FVC:[id number]  DOB: Sep 10, 1940  DOA: 11/12/2019     4  PCP: [name], MD  CC: weakness  Hospital Course: Briana Barr is a 79 y.o. female with history of chronic respiratory failure secondary to COPD on 4 L oxygen, hypertension, diabetes mellitus type 2, peripheral vascular disease, previous history of DVT who presented to the ER because of worsening shortness of breath over the last week.  Patient states initially she was having productive cough and has been feeling increasingly weak.  Denies chest pain nausea vomiting or abdominal pain.  Since patient was progressively getting weak patient was brought to the ER.   In the ER patient was short of breath with chest x-ray showing infiltrates concerning for pneumonia versus edema.  Lab work was significant for WBC count of 21,000 lactic acid of 2.2 blood glucose 244 potassium of 3.3 EKG shows tachycardia with nonspecific ST-T changes in the inferior leads.  Patient was started on empiric antibiotics for pneumonia also was started on nebulizer and steroids for possible COPD exacerbation.  Covid test is negative.  Her strep pna urinary Ag was positive and her blood cultures also grew 4/4 bottles with Strep Pna. She was placed on CTX on admission for PNA coverage initially as well.  Repeat blood cultures were drawn on 11/14/2019.  She was also evaluated by physical therapy during hospitalization due to her weakness and deconditioning.  She also was amenable for placement in rehab at time of discharge when available.  Cultures from 8/3 became positive on 8/5 (1/4 GPC). She underwent TTE on 8/6 which was negative for vegetation. Noted mild LVH, EF > 75%, Gr 1 DD.    Interval History:  No events overnight. Still weak in bed and doesn't remember a lot from yesterday even. But denies CP, SOB.   Old records reviewed in assessment of this patient  ROS: Constitutional: positive for fatigue and malaise,  Respiratory: positive for cough, Cardiovascular: negative for chest pain and Gastrointestinal: negative for abdominal pain  Assessment & Plan: Acute respiratory failure with hypoxia ([place]) - patient on chronic O2 at home, 4L - continue O2  COPD (chronic obstructive pulmonary disease) (HCC) - no s/s exacerbation - continue to monitor   Diabetes mellitus type 2 in nonobese (HCC) - continue SSI and CBG monitoring   Essential hypertension - continue current regimen   CAP (community acquired pneumonia) - noted on CXR; also positive strep pna urinary Ag; has also translocated to blood; see bacteremia - continue rocephin  Bacteremia - admission blood cultures growing 4/4 Strep pna - suspected seeding from lungs - repeat blood cultures on 8/3 started growing on day 2 (1/4 positive for GPC, follow for speciation) - repeat cultures again on 8/5; will go ahead and obtain TTE to rule out veg as well: no veg appreciated - continue Rocephin    Antimicrobials: Rocephin 11/13/19>> present Azithro 11/13/19 x 1  DVT prophylaxis: Lovenox Code Status: Full Family Communication: none present Disposition Plan:  Status is: Inpatient  Remains inpatient appropriate because:Unsafe d/c plan, IV treatments appropriate due to intensity of illness or inability to take PO and Inpatient level of care appropriate due to severity of illness   Dispo: The patient is from: Home              Anticipated d/c is to: Rehab              Anticipated d/c date is: > 3 days  Patient currently is not medically stable to d/c.  Objective: Blood pressure 112/65, pulse (!) 104, temperature 98.9 F (37.2 C), temperature source Oral, resp. rate 20, height 5\' 6"  (1.676 m), weight 61.2 kg, SpO2 (!) 87 %.  Examination: General appearance: alert, cooperative and fatigued Head: Normocephalic, without obvious abnormality, atraumatic Eyes: EOMI Lungs: coarse breath sounds bilaterally, no obvious wheezing Heart:  regular rate and rhythm and S1, S2 normal Abdomen: normal findings: bowel sounds normal and soft, non-tender Extremities: no edema Skin: mobility and turgor normal Neurologic: Grossly normal   Consultants:   n/a  Procedures:   n/a  Data Reviewed: I have personally reviewed following labs and imaging studies Results for orders placed or performed during the hospital encounter of 11/12/19 (from the past 24 hour(s))  Glucose, capillary     Status: Abnormal   Collection Time: 11/16/19  4:55 PM  Result Value Ref Range   Glucose-Capillary 173 (H) 70 - 99 mg/dL  Glucose, capillary     Status: Abnormal   Collection Time: 11/16/19  9:10 PM  Result Value Ref Range   Glucose-Capillary 179 (H) 70 - 99 mg/dL   Comment 1 Notify RN    Comment 2 Document in Chart   Basic metabolic panel     Status: Abnormal   Collection Time: 11/17/19  4:17 AM  Result Value Ref Range   Sodium 137 135 - 145 mmol/L   Potassium 3.7 3.5 - 5.1 mmol/L   Chloride 98 98 - 111 mmol/L   CO2 30 22 - 32 mmol/L   Glucose, Bld 257 (H) 70 - 99 mg/dL   BUN 27 (H) 8 - 23 mg/dL   Creatinine, Ser 0.61 0.44 - 1.00 mg/dL   Calcium 9.2 8.9 - 10.3 mg/dL   GFR calc non Af Amer >60 >60 mL/min   GFR calc Af Amer >60 >60 mL/min   Anion gap 9 5 - 15  CBC with Differential/Platelet     Status: Abnormal   Collection Time: 11/17/19  4:17 AM  Result Value Ref Range   WBC 15.3 (H) 4.0 - 10.5 K/uL   RBC 4.22 3.87 - 5.11 MIL/uL   Hemoglobin 12.6 12.0 - 15.0 g/dL   HCT 42.0 36 - 46 %   MCV 99.5 80.0 - 100.0 fL   MCH 29.9 26.0 - 34.0 pg   MCHC 30.0 30.0 - 36.0 g/dL   RDW 14.2 11.5 - 15.5 %   Platelets 497 (H) 150 - 400 K/uL   nRBC 0.1 0.0 - 0.2 %   Neutrophils Relative % 69 %   Neutro Abs 10.7 (H) 1.7 - 7.7 K/uL   Lymphocytes Relative 9 %   Lymphs Abs 1.4 0.7 - 4.0 K/uL   Monocytes Relative 11 %   Monocytes Absolute 1.6 (H) 0 - 1 K/uL   Eosinophils Relative 2 %   Eosinophils Absolute 0.3 0 - 0 K/uL   Basophils Relative 0 %     Basophils Absolute 0.0 0 - 0 K/uL   WBC Morphology      MODERATE LEFT SHIFT (>5% METAS AND MYELOS,OCC PRO NOTED)   Immature Granulocytes 9 %   Abs Immature Granulocytes 1.31 (H) 0.00 - 0.07 K/uL  Magnesium     Status: None   Collection Time: 11/17/19  4:17 AM  Result Value Ref Range   Magnesium 2.1 1.7 - 2.4 mg/dL  Glucose, capillary     Status: Abnormal   Collection Time: 11/17/19  7:58 AM  Result Value Ref Range  Glucose-Capillary 167 (H) 70 - 99 mg/dL  Glucose, capillary     Status: Abnormal   Collection Time: 11/17/19 11:49 AM  Result Value Ref Range   Glucose-Capillary 224 (H) 70 - 99 mg/dL    Recent Results (from the past 240 hour(s))  Blood culture (routine x 2)     Status: Abnormal   Collection Time: 11/12/19 11:35 PM   Specimen: BLOOD LEFT FOREARM  Result Value Ref Range Status   Specimen Description   Final    BLOOD LEFT FOREARM Performed at [facility] [address], [place], [place] [address]    Special Requests   Final    BOTTLES DRAWN AEROBIC AND ANAEROBIC Blood Culture adequate volume Performed at [facility] [address], [place], [place] [address]    Culture  Setup Time   Final    GRAM POSITIVE COCCI IN PAIRS IN CHAINS IN BOTH AEROBIC AND ANAEROBIC BOTTLES Organism ID to follow CRITICAL RESULT CALLED TO, READ BACK BY AND VERIFIED WITH: Beatrix Shipper MD @1720  11/13/19 EB Performed at [facility], [place] [address], [place], [place] [address]    Culture STREPTOCOCCUS PNEUMONIAE (A)  Final   Report Status 11/15/2019 FINAL  Final   Organism ID, Bacteria STREPTOCOCCUS PNEUMONIAE  Final      Susceptibility   Streptococcus pneumoniae - MIC*    ERYTHROMYCIN >=8 RESISTANT Resistant     LEVOFLOXACIN 0.5 SENSITIVE Sensitive     VANCOMYCIN 0.5 SENSITIVE Sensitive     PENICILLIN (meningitis) 0.25 RESISTANT Resistant     PENO - penicillin 0.25      PENICILLIN (non-meningitis) 0.25 SENSITIVE Sensitive      PENICILLIN (oral) 0.25 INTERMEDIATE Intermediate     CEFTRIAXONE (non-meningitis) 0.5 SENSITIVE Sensitive     CEFTRIAXONE (meningitis) 0.5 SENSITIVE Sensitive     * STREPTOCOCCUS PNEUMONIAE  Blood Culture ID Panel (Reflexed)     Status: Abnormal   Collection Time: 11/12/19 11:35 PM  Result Value Ref Range Status   Enterococcus species NOT DETECTED NOT DETECTED Final   Listeria monocytogenes NOT DETECTED NOT DETECTED Final   Staphylococcus species NOT DETECTED NOT DETECTED Final   Staphylococcus aureus (BCID) NOT DETECTED NOT DETECTED Final   Streptococcus species DETECTED (A) NOT DETECTED Final    Comment: RESULT CALLED TO, READ BACK BY AND VERIFIED WITH: Beatrix Shipper MD @1720  11/13/19 EB    Streptococcus agalactiae NOT DETECTED NOT DETECTED Final   Streptococcus pneumoniae DETECTED (A) NOT DETECTED Final    Comment: RESULT CALLED TO, READ BACK BY AND VERIFIED WITH: Beatrix Shipper MD @1720  11/13/19 EB    Streptococcus pyogenes NOT DETECTED NOT DETECTED Final   Acinetobacter baumannii NOT DETECTED NOT DETECTED Final   Enterobacteriaceae species NOT DETECTED NOT DETECTED Final   Enterobacter cloacae complex NOT DETECTED NOT DETECTED Final   Escherichia coli NOT DETECTED NOT DETECTED Final   Klebsiella oxytoca NOT DETECTED NOT DETECTED Final   Klebsiella pneumoniae NOT DETECTED NOT DETECTED Final   Proteus species NOT DETECTED NOT DETECTED Final   Serratia marcescens NOT DETECTED NOT DETECTED Final   Haemophilus influenzae NOT DETECTED NOT DETECTED Final   Neisseria meningitidis NOT DETECTED NOT DETECTED Final   Pseudomonas aeruginosa NOT DETECTED NOT DETECTED Final   Candida albicans NOT DETECTED NOT DETECTED Final   Candida glabrata NOT DETECTED NOT DETECTED Final   Candida krusei NOT DETECTED NOT DETECTED Final   Candida parapsilosis NOT DETECTED NOT DETECTED Final   Candida tropicalis NOT DETECTED NOT  DETECTED Final    Comment: Performed at [facility], [place] [address], [place], [place] [address]  Blood culture (routine x 2)     Status: Abnormal   Collection Time: 11/12/19 11:40 PM   Specimen: BLOOD  Result Value Ref Range Status   Specimen Description BLOOD RIGHT ANTECUBITAL  Final   Special Requests   Final    BOTTLES DRAWN AEROBIC AND ANAEROBIC Blood Culture adequate volume   Culture  Setup Time   Final    GRAM POSITIVE COCCI IN PAIRS IN CHAINS IN BOTH AEROBIC AND ANAEROBIC BOTTLES CRITICAL VALUE NOTED.  VALUE IS CONSISTENT WITH PREVIOUSLY REPORTED AND CALLED VALUE.    Culture (A)  Final    STREPTOCOCCUS PNEUMONIAE SUSCEPTIBILITIES PERFORMED ON PREVIOUS CULTURE WITHIN THE LAST 5 DAYS.    Report Status 11/15/2019 FINAL  Final  SARS Coronavirus 2 by RT PCR (hospital order, performed in [facility] hospital lab) Nasopharyngeal Nasopharyngeal Swab     Status: None   Collection Time: 11/13/19  1:00 AM   Specimen: Nasopharyngeal Swab  Result Value Ref Range Status   SARS Coronavirus 2 NEGATIVE NEGATIVE Final    Comment: (NOTE) SARS-CoV-2 target nucleic acids are NOT DETECTED.  The SARS-CoV-2 RNA is generally detectable in upper and lower respiratory specimens during the acute phase of infection. The lowest concentration of SARS-CoV-2 viral copies this assay can detect is 250 copies / mL. A negative result does not preclude SARS-CoV-2 infection and should not be used as the sole basis for treatment or other patient management decisions.  A negative result may occur with improper specimen collection / handling, submission of specimen other than nasopharyngeal swab, presence of viral mutation(s) within the areas targeted by this assay, and inadequate number of viral copies (<250 copies / mL). A negative result must be combined with clinical observations, patient history, and epidemiological information.  Fact Sheet for Patients:   StrictlyIdeas.no  Fact Sheet for Healthcare  Providers: BankingDealers.co.za  This test is not yet approved or  cleared by the [place] FDA and has been authorized for detection and/or diagnosis of SARS-CoV-2 by FDA under an Emergency Use Authorization (EUA).  This EUA will remain in effect (meaning this test can be used) for the duration of the COVID-19 declaration under Section 564(b)(1) of the Act, 21 U.S.C. section 360bbb-3(b)(1), unless the authorization is terminated or revoked sooner.  Performed at [facility], [place] [address], [place], [place] [address]   Urine culture     Status: Abnormal   Collection Time: 11/13/19  4:56 AM   Specimen: Urine, Clean Catch  Result Value Ref Range Status   Specimen Description   Final    URINE, CLEAN CATCH Performed at [facility], [place] [address], [place], [place] [address]    Special Requests   Final    NONE Performed at [facility], [place] [address], [place], [place] [address]    Culture (A)  Final    <10,000 COLONIES/mL INSIGNIFICANT GROWTH Performed at [facility] [address] [place], [place] [address]    Report Status 11/14/2019 FINAL  Final  Culture, sputum-assessment     Status: None   Collection Time: 11/13/19  7:51 AM   Specimen: Expectorated Sputum  Result Value Ref Range Status   Specimen Description EXPECTORATED SPUTUM  Final   Special Requests NONE  Final   Sputum evaluation   Final    Sputum specimen not acceptable for testing.  Please recollect.   lonily, k by jolande  Performed at [facility], [place] [address], [place], [place] [address]    Report Status 11/13/2019 FINAL  Final  Culture, blood (routine x 2)     Status: None (Preliminary result)   Collection Time: 11/14/19 11:50 AM   Specimen: BLOOD LEFT HAND  Result Value Ref Range Status   Specimen Description   Final    BLOOD LEFT HAND Performed at [facility]  [address] [place], [place] [address]    Special Requests   Final    BOTTLES DRAWN AEROBIC AND ANAEROBIC Blood Culture adequate volume Performed at [facility] [address], [place], [place] [address]    Culture   Final    NO GROWTH 2 DAYS Performed at [facility] [address], [place], [place] [address]    Report Status PENDING  Incomplete  Culture, blood (routine x 2)     Status: None (Preliminary result)   Collection Time: 11/14/19 11:56 AM   Specimen: BLOOD  Result Value Ref Range Status   Specimen Description   Final    BLOOD BLOOD RIGHT FOREARM Performed at [facility] [address], [place], [place] [address]    Special Requests   Final    BOTTLES DRAWN AEROBIC AND ANAEROBIC Blood Culture adequate volume Performed at [facility] [address] [place], [place] [address]    Culture  Setup Time   Final    GRAM POSITIVE COCCI AEROBIC BOTTLE ONLY CRITICAL VALUE NOTED.  VALUE IS CONSISTENT WITH PREVIOUSLY REPORTED AND CALLED VALUE. Performed at [facility], [place] [address], [place], [place] [address]    Culture [facility] POSITIVE COCCI  Final   Report Status PENDING  Incomplete     Radiology Studies: ECHOCARDIOGRAM COMPLETE  Result Date: 11/17/2019    ECHOCARDIOGRAM REPORT   Patient Name:   MASHAL SLAVICK Date of Exam: 11/17/2019 Medical Rec #:  [id number]    Height:       66.0 in Accession #:    [id number]   Weight:       135.0 lb Date of Birth:  1940/10/17    BSA:          1.692 m Patient Age:    80 years     BP:           112/66 mmHg Patient Gender: F            HR:           97 bpm. Exam Location:  Inpatient Procedure: 2D Echo Indications:    790.7 bacteremia  History:        Patient has prior history of Echocardiogram examinations, most                 recent 01/28/2016. COPD; Risk Factors:Dyslipidemia and Current                 Smoker.  Sonographer:    Jonelle Sidle Dance Referring Phys: [place]   Sonographer Comments: Suboptimal parasternal window. IMPRESSIONS  1. Hyperdynamic LV systolic function; intracavitary gradient of 3 m/s; grade 1 diastolic dysfunction; mild LVH.  2. Left ventricular ejection fraction, by estimation, is >75%. The left ventricle has hyperdynamic function. The left ventricle has no regional wall motion abnormalities. There is mild left ventricular hypertrophy. Left ventricular diastolic parameters are consistent with Grade I diastolic dysfunction (impaired relaxation).  3. Right ventricular  systolic function is normal. The right ventricular size is normal.  4. The mitral valve is normal in structure. No evidence of mitral valve regurgitation. No evidence of mitral stenosis.  5. The aortic valve has an indeterminant number of cusps. Aortic valve regurgitation is not visualized. No aortic stenosis is present.  6. The inferior vena cava is normal in size with greater than 50% respiratory variability, suggesting right atrial pressure of 3 mmHg. FINDINGS  Left Ventricle: Left ventricular ejection fraction, by estimation, is >75%. The left ventricle has hyperdynamic function. The left ventricle has no regional wall motion abnormalities. The left ventricular internal cavity size was normal in size. There is mild left ventricular hypertrophy. Left ventricular diastolic parameters are consistent with Grade I diastolic dysfunction (impaired relaxation). Right Ventricle: The right ventricular size is normal.Right ventricular systolic function is normal. Left Atrium: Left atrial size was normal in size. Right Atrium: Right atrial size was normal in size. Pericardium: There is no evidence of pericardial effusion. Mitral Valve: The mitral valve is normal in structure. Normal mobility of the mitral valve leaflets. Moderate mitral annular calcification. No evidence of mitral valve regurgitation. No evidence of mitral valve stenosis. Tricuspid Valve: The tricuspid valve is normal in structure.  Tricuspid valve regurgitation is trivial. No evidence of tricuspid stenosis. Aortic Valve: The aortic valve has an indeterminant number of cusps. Aortic valve regurgitation is not visualized. No aortic stenosis is present. Pulmonic Valve: The pulmonic valve was not well visualized. Pulmonic valve regurgitation is not visualized. No evidence of pulmonic stenosis. Aorta: The aortic root is normal in size and structure. Venous: The inferior vena cava is normal in size with greater than 50% respiratory variability, suggesting right atrial pressure of 3 mmHg.  Additional Comments: Hyperdynamic LV systolic function; intracavitary gradient of 3 m/s; grade 1 diastolic dysfunction; mild LVH.  LEFT VENTRICLE PLAX 2D LVIDd:         3.20 cm  Diastology LVIDs:         2.30 cm  LV e' lateral:   8.16 cm/s LV PW:         1.00 cm  LV E/e' lateral: 7.9 LV IVS:        1.30 cm  LV e' medial:    5.22 cm/s LVOT diam:     2.20 cm  LV E/e' medial:  12.3 LV SV:         70 LV SV Index:   41 LVOT Area:     3.80 cm  RIGHT VENTRICLE RV S prime:     13.40 cm/s LEFT ATRIUM             Index       RIGHT ATRIUM           Index LA diam:        2.90 cm 1.71 cm/m  RA Area:     14.80 cm LA Vol (A2C):   39.6 ml 23.40 ml/m RA Volume:   36.40 ml  21.51 ml/m LA Vol (A4C):   20.8 ml 12.29 ml/m LA Biplane Vol: 30.1 ml 17.79 ml/m  AORTIC VALVE LVOT Vmax:   120.00 cm/s LVOT Vmean:  80.300 cm/s LVOT VTI:    0.184 m  AORTA Ao Root diam: 2.70 cm MITRAL VALVE MV Area (PHT): 3.74 cm     SHUNTS MV Decel Time: 203 msec     Systemic VTI:  0.18 m MV E velocity: 64.30 cm/s   Systemic Diam: 2.20 cm MV A velocity: 131.00 cm/s MV  E/A ratio:  0.49 Kirk Ruths MD Electronically signed by Kirk Ruths MD Signature Date/Time: 11/17/2019/11:54:44 AM    Final    CT ANGIO CHEST PE W OR WO CONTRAST  Final Result    DG Chest Portable 1 View  Final Result       Scheduled Meds: . acetaZOLAMIDE  250 mg Oral Daily  . aspirin  81 mg Oral Daily  . budesonide  (PULMICORT) nebulizer solution  0.25 mg Nebulization BID  . enoxaparin (LOVENOX) injection  40 mg Subcutaneous Q24H  . insulin aspart  0-5 Units Subcutaneous QHS  . insulin aspart  0-9 Units Subcutaneous TID WC  . ipratropium-albuterol  3 mL Nebulization BID  . losartan  50 mg Oral Daily  . multivitamin with minerals  1 tablet Oral Daily  . rosuvastatin  20 mg Oral QPC breakfast   PRN Meds: albuterol, ondansetron **OR** ondansetron (ZOFRAN) IV Continuous Infusions: . cefTRIAXone (ROCEPHIN)  IV 2 g (11/16/19 1720)      LOS: 4 days  Time spent: Greater than 50% of the 35 minute visit was spent in counseling/coordination of care for the patient as laid out in the A&P.   Dwyane Dee, MD [facility] 11/17/2019, 2:12 PM   Contact via secure chat.  To contact the attending provider between 7A-7P or the covering provider during after hours 7P-7A, please log into the web site [email or web address] and access using universal [place] password for that web site. If you do not have the password, please call the hospital operator.

## 2019-11-17 NOTE — Progress Notes (Signed)
  Echocardiogram 2D Echocardiogram has been performed.  Briana Barr 11/17/2019, 9:04 AM

## 2019-11-17 NOTE — Plan of Care (Signed)

## 2019-11-18 LAB — GLUCOSE, CAPILLARY
Glucose-Capillary: 147 mg/dL — ABNORMAL HIGH (ref 70–99)
Glucose-Capillary: 299 mg/dL — ABNORMAL HIGH (ref 70–99)
Glucose-Capillary: 235 mg/dL — ABNORMAL HIGH (ref 70–99)
Glucose-Capillary: 261 mg/dL — ABNORMAL HIGH (ref 70–99)

## 2019-11-18 LAB — CBC WITH DIFFERENTIAL/PLATELET
Abs Immature Granulocytes: 1.52 10*3/uL — ABNORMAL HIGH (ref 0.00–0.07)
Basophils Absolute: 0.1 10*3/uL (ref 0.0–0.1)
Basophils Relative: 0 %
Eosinophils Absolute: 0.3 10*3/uL (ref 0.0–0.5)
Eosinophils Relative: 2 %
HCT: 44 % (ref 36.0–46.0)
Hemoglobin: 13 g/dL (ref 12.0–15.0)
Immature Granulocytes: 11 %
Lymphocytes Relative: 12 %
Lymphs Abs: 1.6 10*3/uL (ref 0.7–4.0)
MCH: 29.3 pg (ref 26.0–34.0)
MCHC: 29.5 g/dL — ABNORMAL LOW (ref 30.0–36.0)
MCV: 99.1 fL (ref 80.0–100.0)
Monocytes Absolute: 1.6 10*3/uL — ABNORMAL HIGH (ref 0.1–1.0)
Monocytes Relative: 12 %
Neutro Abs: 8.2 10*3/uL — ABNORMAL HIGH (ref 1.7–7.7)
Neutrophils Relative %: 63 %
Platelets: 539 10*3/uL — ABNORMAL HIGH (ref 150–400)
RBC: 4.44 MIL/uL (ref 3.87–5.11)
RDW: 14.2 % (ref 11.5–15.5)
WBC: 13.4 10*3/uL — ABNORMAL HIGH (ref 4.0–10.5)
nRBC: 0 % (ref 0.0–0.2)

## 2019-11-18 LAB — BASIC METABOLIC PANEL
Anion gap: 11 (ref 5–15)
BUN: 27 mg/dL — ABNORMAL HIGH (ref 8–23)
CO2: 29 mmol/L (ref 22–32)
Calcium: 9.1 mg/dL (ref 8.9–10.3)
Chloride: 101 mmol/L (ref 98–111)
Creatinine, Ser: 0.61 mg/dL (ref 0.44–1.00)
GFR calc Af Amer: >60 mL/min (ref >60)
GFR calc non Af Amer: >60 mL/min (ref >60)
Glucose, Bld: 165 mg/dL — ABNORMAL HIGH (ref 70–99)
Potassium: 4 mmol/L (ref 3.5–5.1)
Sodium: 141 mmol/L (ref 135–145)

## 2019-11-18 LAB — CULTURE, BLOOD (ROUTINE X 2): Special Requests: ADEQUATE

## 2019-11-18 LAB — MAGNESIUM: Magnesium: 2.2 mg/dL (ref 1.7–2.4)

## 2019-11-18 NOTE — Plan of Care (Signed)
  Problem: Education: Goal: Knowledge of General Education information will improve Description: Including pain rating scale, medication(s)/side effects and non-pharmacologic comfort measures Outcome: Progressing   Problem: Clinical Measurements: Goal: Respiratory complications will improve Outcome: Progressing   Problem: Clinical Measurements: Goal: Cardiovascular complication will be avoided Outcome: Progressing   Problem: Elimination: Goal: Will not experience complications related to urinary retention Outcome: Progressing   Problem: Skin Integrity: Goal: Risk for impaired skin integrity will decrease Outcome: Progressing

## 2019-11-18 NOTE — Progress Notes (Signed)
PROGRESS NOTE    Briana Barr   ZJI:[id number]  DOB: Sep 03, 1940  DOA: 11/12/2019     5  PCP: [name], MD  CC: weakness  Hospital Course: Briana Barr is a 79 y.o. female with history of chronic respiratory failure secondary to COPD on 4 L oxygen, hypertension, diabetes mellitus type 2, peripheral vascular disease, previous history of DVT who presented to the ER because of worsening shortness of breath over the last week.  Patient states initially she was having productive cough and has been feeling increasingly weak.  Denies chest pain nausea vomiting or abdominal pain.  Since patient was progressively getting weak patient was brought to the ER.   In the ER patient was short of breath with chest x-ray showing infiltrates concerning for pneumonia versus edema.  Lab work was significant for WBC count of 21,000 lactic acid of 2.2 blood glucose 244 potassium of 3.3 EKG shows tachycardia with nonspecific ST-T changes in the inferior leads.  Patient was started on empiric antibiotics for pneumonia also was started on nebulizer and steroids for possible COPD exacerbation.  Covid test is negative.  Her strep pna urinary Ag was positive and her admission blood cultures also grew 4/4 bottles with Strep Pna. She was placed on CTX on admission for PNA coverage initially as well.  Repeat blood cultures were drawn on 11/14/2019.  She was also evaluated by physical therapy during hospitalization due to her weakness and deconditioning.  She also was amenable for placement in rehab at time of discharge when available.  Cultures from 8/3 grew 1/4 CoNS considered contaminate. She underwent TTE on 8/6 which was negative for vegetation. Noted mild LVH, EF > 75%, Gr 1 DD.    Interval History:  No events overnight. Energy is a little better this morning. She is still okay with going to rehab. She never seems to remember what she did with PT. Has no symptoms nor concerns this am.   Old records reviewed in  assessment of this patient  ROS: Constitutional: positive for fatigue and malaise, Respiratory: positive for cough, Cardiovascular: negative for chest pain and Gastrointestinal: negative for abdominal pain  Assessment & Plan: Acute respiratory failure with hypoxia ([place]) - patient on chronic O2 at home, 4L - continue O2  COPD (chronic obstructive pulmonary disease) (HCC) - no s/s exacerbation - continue to monitor   Diabetes mellitus type 2 in nonobese (HCC) - continue SSI and CBG monitoring   Essential hypertension - continue current regimen   CAP (community acquired pneumonia) - noted on CXR; also positive strep pna urinary Ag; has also translocated to blood; see bacteremia - continue rocephin  Bacteremia - admission blood cultures growing 4/4 Strep pna - suspected seeding from lungs - repeat blood cultures on 8/3 considered negative (1/4 noted with CoNS consistent with contaminate) - repeat cultures again on 8/5; will go ahead and obtain TTE to rule out veg as well: no veg appreciated - continue Rocephin; Day 5/7   Antimicrobials: Rocephin 11/13/19>> present Azithro 11/13/19 x 1  DVT prophylaxis: Lovenox Code Status: Full Family Communication: none present Disposition Plan:  Status is: Inpatient  Remains inpatient appropriate because:Unsafe d/c plan, IV treatments appropriate due to intensity of illness or inability to take PO and Inpatient level of care appropriate due to severity of illness   Dispo: The patient is from: Home              Anticipated d/c is to: Rehab  Anticipated d/c date is: 3 days              Patient currently is not medically stable to d/c.  Objective: Blood pressure 110/61, pulse 93, temperature 98.4 F (36.9 C), temperature source Oral, resp. rate 20, height 5\' 6"  (1.676 m), weight 61.2 kg, SpO2 92 %.  Examination: General appearance: alert, cooperative and fatigued Head: Normocephalic, without obvious abnormality,  atraumatic Eyes: EOMI Lungs: coarse breath sounds bilaterally, no obvious wheezing Heart: regular rate and rhythm and S1, S2 normal Abdomen: normal findings: bowel sounds normal and soft, non-tender Extremities: no edema Skin: mobility and turgor normal Neurologic: Grossly normal   Consultants:   n/a  Procedures:   n/a  Data Reviewed: I have personally reviewed following labs and imaging studies Results for orders placed or performed during the hospital encounter of 11/12/19 (from the past 24 hour(s))  Glucose, capillary     Status: Abnormal   Collection Time: 11/17/19  4:46 PM  Result Value Ref Range   Glucose-Capillary 115 (H) 70 - 99 mg/dL  Glucose, capillary     Status: Abnormal   Collection Time: 11/17/19  9:19 PM  Result Value Ref Range   Glucose-Capillary 246 (H) 70 - 99 mg/dL  Basic metabolic panel     Status: Abnormal   Collection Time: 11/18/19  4:42 AM  Result Value Ref Range   Sodium 141 135 - 145 mmol/L   Potassium 4.0 3.5 - 5.1 mmol/L   Chloride 101 98 - 111 mmol/L   CO2 29 22 - 32 mmol/L   Glucose, Bld 165 (H) 70 - 99 mg/dL   BUN 27 (H) 8 - 23 mg/dL   Creatinine, Ser 0.61 0.44 - 1.00 mg/dL   Calcium 9.1 8.9 - 10.3 mg/dL   GFR calc non Af Amer >60 >60 mL/min   GFR calc Af Amer >60 >60 mL/min   Anion gap 11 5 - 15  CBC with Differential/Platelet     Status: Abnormal   Collection Time: 11/18/19  4:42 AM  Result Value Ref Range   WBC 13.4 (H) 4.0 - 10.5 K/uL   RBC 4.44 3.87 - 5.11 MIL/uL   Hemoglobin 13.0 12.0 - 15.0 g/dL   HCT 44.0 36 - 46 %   MCV 99.1 80.0 - 100.0 fL   MCH 29.3 26.0 - 34.0 pg   MCHC 29.5 (L) 30.0 - 36.0 g/dL   RDW 14.2 11.5 - 15.5 %   Platelets 539 (H) 150 - 400 K/uL   nRBC 0.0 0.0 - 0.2 %   Neutrophils Relative % 63 %   Neutro Abs 8.2 (H) 1.7 - 7.7 K/uL   Lymphocytes Relative 12 %   Lymphs Abs 1.6 0.7 - 4.0 K/uL   Monocytes Relative 12 %   Monocytes Absolute 1.6 (H) 0 - 1 K/uL   Eosinophils Relative 2 %   Eosinophils Absolute  0.3 0 - 0 K/uL   Basophils Relative 0 %   Basophils Absolute 0.1 0 - 0 K/uL   WBC Morphology      MODERATE LEFT SHIFT (>5% METAS AND MYELOS,OCC PRO NOTED)   Immature Granulocytes 11 %   Abs Immature Granulocytes 1.52 (H) 0.00 - 0.07 K/uL  Magnesium     Status: None   Collection Time: 11/18/19  4:42 AM  Result Value Ref Range   Magnesium 2.2 1.7 - 2.4 mg/dL  Glucose, capillary     Status: Abnormal   Collection Time: 11/18/19  7:58 AM  Result Value Ref  Range   Glucose-Capillary 147 (H) 70 - 99 mg/dL  Glucose, capillary     Status: Abnormal   Collection Time: 11/18/19 11:14 AM  Result Value Ref Range   Glucose-Capillary 299 (H) 70 - 99 mg/dL    Recent Results (from the past 240 hour(s))  Blood culture (routine x 2)     Status: Abnormal   Collection Time: 11/12/19 11:35 PM   Specimen: BLOOD LEFT FOREARM  Result Value Ref Range Status   Specimen Description   Final    BLOOD LEFT FOREARM Performed at [facility] [address], [place], [place] [address]    Special Requests   Final    BOTTLES DRAWN AEROBIC AND ANAEROBIC Blood Culture adequate volume Performed at [facility] [address] [place], [place] [address]    Culture  Setup Time   Final    GRAM POSITIVE COCCI IN PAIRS IN CHAINS IN BOTH AEROBIC AND ANAEROBIC BOTTLES Organism ID to follow CRITICAL RESULT CALLED TO, READ BACK BY AND VERIFIED WITH: Beatrix Shipper MD @1720  11/13/19 EB Performed at [facility], [place] [address] [place], [place] [address]    Culture STREPTOCOCCUS PNEUMONIAE (A)  Final   Report Status 11/15/2019 FINAL  Final   Organism ID, Bacteria STREPTOCOCCUS PNEUMONIAE  Final      Susceptibility   Streptococcus pneumoniae - MIC*    ERYTHROMYCIN >=8 RESISTANT Resistant     LEVOFLOXACIN 0.5 SENSITIVE Sensitive     VANCOMYCIN 0.5 SENSITIVE Sensitive     PENICILLIN (meningitis) 0.25 RESISTANT Resistant     PENO - penicillin 0.25      PENICILLIN  (non-meningitis) 0.25 SENSITIVE Sensitive     PENICILLIN (oral) 0.25 INTERMEDIATE Intermediate     CEFTRIAXONE (non-meningitis) 0.5 SENSITIVE Sensitive     CEFTRIAXONE (meningitis) 0.5 SENSITIVE Sensitive     * STREPTOCOCCUS PNEUMONIAE  Blood Culture ID Panel (Reflexed)     Status: Abnormal   Collection Time: 11/12/19 11:35 PM  Result Value Ref Range Status   Enterococcus species NOT DETECTED NOT DETECTED Final   Listeria monocytogenes NOT DETECTED NOT DETECTED Final   Staphylococcus species NOT DETECTED NOT DETECTED Final   Staphylococcus aureus (BCID) NOT DETECTED NOT DETECTED Final   Streptococcus species DETECTED (A) NOT DETECTED Final    Comment: RESULT CALLED TO, READ BACK BY AND VERIFIED WITH: Beatrix Shipper MD @1720  11/13/19 EB    Streptococcus agalactiae NOT DETECTED NOT DETECTED Final   Streptococcus pneumoniae DETECTED (A) NOT DETECTED Final    Comment: RESULT CALLED TO, READ BACK BY AND VERIFIED WITH: Beatrix Shipper MD @1720  11/13/19 EB    Streptococcus pyogenes NOT DETECTED NOT DETECTED Final   Acinetobacter baumannii NOT DETECTED NOT DETECTED Final   Enterobacteriaceae species NOT DETECTED NOT DETECTED Final   Enterobacter cloacae complex NOT DETECTED NOT DETECTED Final   Escherichia coli NOT DETECTED NOT DETECTED Final   Klebsiella oxytoca NOT DETECTED NOT DETECTED Final   Klebsiella pneumoniae NOT DETECTED NOT DETECTED Final   Proteus species NOT DETECTED NOT DETECTED Final   Serratia marcescens NOT DETECTED NOT DETECTED Final   Haemophilus influenzae NOT DETECTED NOT DETECTED Final   Neisseria meningitidis NOT DETECTED NOT DETECTED Final   Pseudomonas aeruginosa NOT DETECTED NOT DETECTED Final   Candida albicans NOT DETECTED NOT DETECTED Final   Candida glabrata NOT DETECTED NOT DETECTED Final   Candida krusei NOT DETECTED NOT DETECTED Final   Candida parapsilosis NOT DETECTED NOT DETECTED Final   Candida tropicalis  NOT DETECTED NOT DETECTED Final    Comment:  Performed at [facility], [place] [address] [place], [place] [address]  Blood culture (routine x 2)     Status: Abnormal   Collection Time: 11/12/19 11:40 PM   Specimen: BLOOD  Result Value Ref Range Status   Specimen Description BLOOD RIGHT ANTECUBITAL  Final   Special Requests   Final    BOTTLES DRAWN AEROBIC AND ANAEROBIC Blood Culture adequate volume   Culture  Setup Time   Final    GRAM POSITIVE COCCI IN PAIRS IN CHAINS IN BOTH AEROBIC AND ANAEROBIC BOTTLES CRITICAL VALUE NOTED.  VALUE IS CONSISTENT WITH PREVIOUSLY REPORTED AND CALLED VALUE.    Culture (A)  Final    STREPTOCOCCUS PNEUMONIAE SUSCEPTIBILITIES PERFORMED ON PREVIOUS CULTURE WITHIN THE LAST 5 DAYS.    Report Status 11/15/2019 FINAL  Final  SARS Coronavirus 2 by RT PCR (hospital order, performed in [facility] hospital lab) Nasopharyngeal Nasopharyngeal Swab     Status: None   Collection Time: 11/13/19  1:00 AM   Specimen: Nasopharyngeal Swab  Result Value Ref Range Status   SARS Coronavirus 2 NEGATIVE NEGATIVE Final    Comment: (NOTE) SARS-CoV-2 target nucleic acids are NOT DETECTED.  The SARS-CoV-2 RNA is generally detectable in upper and lower respiratory specimens during the acute phase of infection. The lowest concentration of SARS-CoV-2 viral copies this assay can detect is 250 copies / mL. A negative result does not preclude SARS-CoV-2 infection and should not be used as the sole basis for treatment or other patient management decisions.  A negative result may occur with improper specimen collection / handling, submission of specimen other than nasopharyngeal swab, presence of viral mutation(s) within the areas targeted by this assay, and inadequate number of viral copies (<250 copies / mL). A negative result must be combined with clinical observations, patient history, and epidemiological information.  Fact Sheet for Patients:   StrictlyIdeas.no  Fact Sheet for Healthcare  Providers: BankingDealers.co.za  This test is not yet approved or  cleared by the [place] FDA and has been authorized for detection and/or diagnosis of SARS-CoV-2 by FDA under an Emergency Use Authorization (EUA).  This EUA will remain in effect (meaning this test can be used) for the duration of the COVID-19 declaration under Section 564(b)(1) of the Act, 21 U.S.C. section 360bbb-3(b)(1), unless the authorization is terminated or revoked sooner.  Performed at [facility] [place] [address], [place], [place] [address]   Urine culture     Status: Abnormal   Collection Time: 11/13/19  4:56 AM   Specimen: Urine, Clean Catch  Result Value Ref Range Status   Specimen Description   Final    URINE, CLEAN CATCH Performed at [facility], [place] [address] [place], [place] [address]    Special Requests   Final    NONE Performed at [facility], [place] [address], [place], [place] [address]    Culture (A)  Final    <10,000 COLONIES/mL INSIGNIFICANT GROWTH Performed at [facility] [address] [place],  [address]    Report Status 11/14/2019 FINAL  Final  Culture, sputum-assessment     Status: None   Collection Time: 11/13/19  7:51 AM   Specimen: Expectorated Sputum  Result Value Ref Range Status   Specimen Description EXPECTORATED SPUTUM  Final   Special Requests NONE  Final   Sputum evaluation   Final    Sputum specimen not acceptable  for testing.  Please recollect.   lonily, k by jolande  Performed at [facility], [place] [address] [place], [place] [address]    Report Status 11/13/2019 FINAL  Final  Culture, blood (routine x 2)     Status: None (Preliminary result)   Collection Time: 11/14/19 11:50 AM   Specimen: BLOOD LEFT HAND  Result Value Ref Range Status   Specimen Description   Final    BLOOD LEFT HAND Performed at [facility]  [address], [place], [place] [address]    Special Requests   Final    BOTTLES DRAWN AEROBIC AND ANAEROBIC Blood Culture adequate volume Performed at [facility] [address], [place], [place] [address]    Culture   Final    NO GROWTH 4 DAYS Performed at [facility], [place] [address], [place], [place] [address]    Report Status PENDING  Incomplete  Culture, blood (routine x 2)     Status: Abnormal   Collection Time: 11/14/19 11:56 AM   Specimen: BLOOD  Result Value Ref Range Status   Specimen Description   Final    BLOOD BLOOD RIGHT FOREARM Performed at [facility] [address], [place], [place] [address]    Special Requests   Final    BOTTLES DRAWN AEROBIC AND ANAEROBIC Blood Culture adequate volume Performed at [facility] [address], [place], [place] [address]    Culture  Setup Time   Final    GRAM POSITIVE COCCI AEROBIC BOTTLE ONLY CRITICAL VALUE NOTED.  VALUE IS CONSISTENT WITH PREVIOUSLY REPORTED AND CALLED VALUE.    Culture (A)  Final    STAPHYLOCOCCUS SPECIES (COAGULASE NEGATIVE) THE SIGNIFICANCE OF ISOLATING THIS ORGANISM FROM A SINGLE SET OF BLOOD CULTURES WHEN MULTIPLE SETS ARE DRAWN IS UNCERTAIN. PLEASE NOTIFY THE MICROBIOLOGY DEPARTMENT WITHIN ONE WEEK IF SPECIATION AND SENSITIVITIES ARE REQUIRED. Performed at [facility], [place] [address], [place], [place] [address]    Report Status 11/18/2019 FINAL  Final  Culture, blood (routine x 2)     Status: None (Preliminary result)   Collection Time: 11/16/19  4:00 PM   Specimen: BLOOD  Result Value Ref Range Status   Specimen Description   Final    BLOOD LEFT HAND Performed at [facility] [address], [place], [place] [address]    Special Requests   Final    BOTTLES DRAWN AEROBIC ONLY Blood Culture adequate volume Performed at [facility] [address], [place], [place] [address]    Culture    Final    NO GROWTH 2 DAYS Performed at [facility] [address], [place], [place] [address]    Report Status PENDING  Incomplete  Culture, blood (routine x 2)     Status: None (Preliminary result)   Collection Time: 11/16/19  4:00 PM   Specimen: BLOOD  Result Value Ref Range Status   Specimen Description   Final    BLOOD RIGHT HAND Performed at [facility] [address], [place], [place] [address]    Special Requests   Final    BOTTLES DRAWN AEROBIC ONLY Blood Culture adequate volume Performed at [facility] [address] [place], [place] [address]    Culture   Final    NO GROWTH 2 DAYS Performed at [facility] [address], [place],  [address]    Report  Status PENDING  Incomplete     Radiology Studies: ECHOCARDIOGRAM COMPLETE  Result Date: 11/17/2019    ECHOCARDIOGRAM REPORT   Patient Name:   Briana Barr Date of Exam: 11/17/2019 Medical Rec #:  [id number]    Height:       66.0 in Accession #:    [id number]   Weight:       135.0 lb Date of Birth:  [birth year]    BSA:          1.692 m Patient Age:    58 years     BP:           112/66 mmHg Patient Gender: F            HR:           97 bpm. Exam Location:  Inpatient Procedure: 2D Echo Indications:    790.7 bacteremia  History:        Patient has prior history of Echocardiogram examinations, most                 recent 01/28/2016. COPD; Risk Factors:Dyslipidemia and Current                 Smoker.  Sonographer:    Jonelle Sidle Dance Referring Phys: [place]  Sonographer Comments: Suboptimal parasternal window. IMPRESSIONS  1. Hyperdynamic LV systolic function; intracavitary gradient of 3 m/s; grade 1 diastolic dysfunction; mild LVH.  2. Left ventricular ejection fraction, by estimation, is >75%. The left ventricle has hyperdynamic function. The left ventricle has no regional wall motion abnormalities. There is mild left ventricular hypertrophy. Left ventricular diastolic  parameters are consistent with Grade I diastolic dysfunction (impaired relaxation).  3. Right ventricular systolic function is normal. The right ventricular size is normal.  4. The mitral valve is normal in structure. No evidence of mitral valve regurgitation. No evidence of mitral stenosis.  5. The aortic valve has an indeterminant number of cusps. Aortic valve regurgitation is not visualized. No aortic stenosis is present.  6. The inferior vena cava is normal in size with greater than 50% respiratory variability, suggesting right atrial pressure of 3 mmHg. FINDINGS  Left Ventricle: Left ventricular ejection fraction, by estimation, is >75%. The left ventricle has hyperdynamic function. The left ventricle has no regional wall motion abnormalities. The left ventricular internal cavity size was normal in size. There is mild left ventricular hypertrophy. Left ventricular diastolic parameters are consistent with Grade I diastolic dysfunction (impaired relaxation). Right Ventricle: The right ventricular size is normal.Right ventricular systolic function is normal. Left Atrium: Left atrial size was normal in size. Right Atrium: Right atrial size was normal in size. Pericardium: There is no evidence of pericardial effusion. Mitral Valve: The mitral valve is normal in structure. Normal mobility of the mitral valve leaflets. Moderate mitral annular calcification. No evidence of mitral valve regurgitation. No evidence of mitral valve stenosis. Tricuspid Valve: The tricuspid valve is normal in structure. Tricuspid valve regurgitation is trivial. No evidence of tricuspid stenosis. Aortic Valve: The aortic valve has an indeterminant number of cusps. Aortic valve regurgitation is not visualized. No aortic stenosis is present. Pulmonic Valve: The pulmonic valve was not well visualized. Pulmonic valve regurgitation is not visualized. No evidence of pulmonic stenosis. Aorta: The aortic root is normal in size and structure. Venous:  The inferior vena cava is normal in size with greater than 50% respiratory variability, suggesting right atrial pressure of 3 mmHg.  Additional Comments: Hyperdynamic LV systolic function; intracavitary gradient of 3  m/s; grade 1 diastolic dysfunction; mild LVH.  LEFT VENTRICLE PLAX 2D LVIDd:         3.20 cm  Diastology LVIDs:         2.30 cm  LV e' lateral:   8.16 cm/s LV PW:         1.00 cm  LV E/e' lateral: 7.9 LV IVS:        1.30 cm  LV e' medial:    5.22 cm/s LVOT diam:     2.20 cm  LV E/e' medial:  12.3 LV SV:         70 LV SV Index:   41 LVOT Area:     3.80 cm  RIGHT VENTRICLE RV S prime:     13.40 cm/s LEFT ATRIUM             Index       RIGHT ATRIUM           Index LA diam:        2.90 cm 1.71 cm/m  RA Area:     14.80 cm LA Vol (A2C):   39.6 ml 23.40 ml/m RA Volume:   36.40 ml  21.51 ml/m LA Vol (A4C):   20.8 ml 12.29 ml/m LA Biplane Vol: 30.1 ml 17.79 ml/m  AORTIC VALVE LVOT Vmax:   120.00 cm/s LVOT Vmean:  80.300 cm/s LVOT VTI:    0.184 m  AORTA Ao Root diam: 2.70 cm MITRAL VALVE MV Area (PHT): 3.74 cm     SHUNTS MV Decel Time: 203 msec     Systemic VTI:  0.18 m MV E velocity: 64.30 cm/s   Systemic Diam: 2.20 cm MV A velocity: 131.00 cm/s MV E/A ratio:  0.49 Kirk Ruths MD Electronically signed by Kirk Ruths MD Signature Date/Time: 11/17/2019/11:54:44 AM    Final    CT ANGIO CHEST PE W OR WO CONTRAST  Final Result    DG Chest Portable 1 View  Final Result       Scheduled Meds: . acetaZOLAMIDE  250 mg Oral Daily  . aspirin  81 mg Oral Daily  . budesonide (PULMICORT) nebulizer solution  0.25 mg Nebulization BID  . enoxaparin (LOVENOX) injection  40 mg Subcutaneous Q24H  . insulin aspart  0-5 Units Subcutaneous QHS  . insulin aspart  0-9 Units Subcutaneous TID WC  . ipratropium-albuterol  3 mL Nebulization BID  . losartan  50 mg Oral Daily  . multivitamin with minerals  1 tablet Oral Daily  . rosuvastatin  20 mg Oral QPC breakfast   PRN Meds: albuterol, ondansetron **OR**  ondansetron (ZOFRAN) IV Continuous Infusions: . cefTRIAXone (ROCEPHIN)  IV Stopped (11/17/19 1800)      LOS: 5 days  Time spent: Greater than 50% of the 35 minute visit was spent in counseling/coordination of care for the patient as laid out in the A&P.   Dwyane Dee, MD [facility] 11/18/2019, 1:47 PM   Contact via secure chat.  To contact the attending provider between 7A-7P or the covering provider during after hours 7P-7A, please log into the web site [email or web address] and access using universal [place] password for that web site. If you do not have the password, please call the hospital operator.

## 2019-11-19 ENCOUNTER — Inpatient Hospital Stay (HOSPITAL_COMMUNITY): Payer: Medicare Other

## 2019-11-19 LAB — CBC WITH DIFFERENTIAL/PLATELET
Abs Immature Granulocytes: 1.1 10*3/uL — ABNORMAL HIGH (ref 0.00–0.07)
Basophils Absolute: 0 10*3/uL (ref 0.0–0.1)
Basophils Relative: 0 %
Eosinophils Absolute: 0.4 10*3/uL (ref 0.0–0.5)
Eosinophils Relative: 3 %
HCT: 43 % (ref 36.0–46.0)
Hemoglobin: 12.8 g/dL (ref 12.0–15.0)
Immature Granulocytes: 8 %
Lymphocytes Relative: 11 %
Lymphs Abs: 1.5 10*3/uL (ref 0.7–4.0)
MCH: 29.6 pg (ref 26.0–34.0)
MCHC: 29.8 g/dL — ABNORMAL LOW (ref 30.0–36.0)
MCV: 99.5 fL (ref 80.0–100.0)
Monocytes Absolute: 1.4 10*3/uL — ABNORMAL HIGH (ref 0.1–1.0)
Monocytes Relative: 11 %
Neutro Abs: 9.1 10*3/uL — ABNORMAL HIGH (ref 1.7–7.7)
Neutrophils Relative %: 67 %
Platelets: 534 10*3/uL — ABNORMAL HIGH (ref 150–400)
RBC: 4.32 MIL/uL (ref 3.87–5.11)
RDW: 14 % (ref 11.5–15.5)
WBC: 13.5 10*3/uL — ABNORMAL HIGH (ref 4.0–10.5)
nRBC: 0 % (ref 0.0–0.2)

## 2019-11-19 LAB — CULTURE, BLOOD (ROUTINE X 2)
Culture: NO GROWTH
Special Requests: ADEQUATE

## 2019-11-19 LAB — BASIC METABOLIC PANEL
Anion gap: 10 (ref 5–15)
BUN: 25 mg/dL — ABNORMAL HIGH (ref 8–23)
CO2: 29 mmol/L (ref 22–32)
Calcium: 8.7 mg/dL — ABNORMAL LOW (ref 8.9–10.3)
Chloride: 100 mmol/L (ref 98–111)
Creatinine, Ser: 0.67 mg/dL (ref 0.44–1.00)
GFR calc Af Amer: >60 mL/min (ref >60)
GFR calc non Af Amer: >60 mL/min (ref >60)
Glucose, Bld: 226 mg/dL — ABNORMAL HIGH (ref 70–99)
Potassium: 4.1 mmol/L (ref 3.5–5.1)
Sodium: 139 mmol/L (ref 135–145)

## 2019-11-19 LAB — GLUCOSE, CAPILLARY
Glucose-Capillary: 175 mg/dL — ABNORMAL HIGH (ref 70–99)
Glucose-Capillary: 233 mg/dL — ABNORMAL HIGH (ref 70–99)
Glucose-Capillary: 233 mg/dL — ABNORMAL HIGH (ref 70–99)
Glucose-Capillary: 322 mg/dL — ABNORMAL HIGH (ref 70–99)

## 2019-11-19 LAB — MAGNESIUM: Magnesium: 2.1 mg/dL (ref 1.7–2.4)

## 2019-11-19 MED ORDER — ORAL CARE MOUTH RINSE
15.0000 mL | Freq: Two times a day (BID) | OROMUCOSAL | Status: DC
  Administered 2019-11-20: 15 mL via OROMUCOSAL

## 2019-11-19 NOTE — Progress Notes (Signed)
PROGRESS NOTE    Briana Barr   XIH:[id number]  DOB: April 27, 1940  DOA: 11/12/2019     6  PCP: [name], MD  CC: weakness  Hospital Course: Briana Barr is a 79 y.o. female with history of chronic respiratory failure secondary to COPD on 4 L oxygen, hypertension, diabetes mellitus type 2, peripheral vascular disease, previous history of DVT who presented to the ER because of worsening shortness of breath over the last week.  Patient states initially she was having productive cough and has been feeling increasingly weak.  Denies chest pain nausea vomiting or abdominal pain.  Since patient was progressively getting weak patient was brought to the ER.   In the ER patient was short of breath with chest x-ray showing infiltrates concerning for pneumonia versus edema.  Lab work was significant for WBC count of 21,000 lactic acid of 2.2 blood glucose 244 potassium of 3.3 EKG shows tachycardia with nonspecific ST-T changes in the inferior leads.  Patient was started on empiric antibiotics for pneumonia also was started on nebulizer and steroids for possible COPD exacerbation.  Covid test is negative.  Her strep pna urinary Ag was positive and her admission blood cultures also grew 4/4 bottles with Strep Pna. She was placed on CTX on admission for PNA coverage initially as well.  Repeat blood cultures were drawn on 11/14/2019.  She was also evaluated by physical therapy during hospitalization due to her weakness and deconditioning.  She also was amenable for placement in rehab at time of discharge when available.  Cultures from 8/3 grew 1/4 CoNS considered contaminate. She underwent TTE on 8/6 which was negative for vegetation. Noted mild LVH, EF > 75%, Gr 1 DD.    Interval History:  No events overnight.  Resting in bed comfortably this morning.  Once again, she still seems to have difficulty recalling events from yesterday.  She does remember a little bit of working with physical therapy a  couple days ago but cannot recall what she was able to do.  Old records reviewed in assessment of this patient  ROS: Constitutional: positive for fatigue and malaise, Respiratory: positive for cough, Cardiovascular: negative for chest pain and Gastrointestinal: negative for abdominal pain  Assessment & Plan: Acute respiratory failure with hypoxia ([place]) - patient on chronic O2 at home, 4L - continue O2  COPD (chronic obstructive pulmonary disease) (HCC) - no s/s exacerbation - continue to monitor   Diabetes mellitus type 2 in nonobese (HCC) - continue SSI and CBG monitoring   Essential hypertension - continue current regimen   CAP (community acquired pneumonia) - noted on CXR; also positive strep pna urinary Ag; has also translocated to blood; see bacteremia - continue rocephin -CXR repeated on 11/19/2019, shows improvement in left-sided pneumonia  Bacteremia - admission blood cultures growing 4/4 Strep pna - suspected seeding from lungs - repeat blood cultures on 8/3 considered negative (1/4 noted with CoNS consistent with contaminate) - repeat cultures again on 8/5; will go ahead and obtain TTE to rule out veg as well: no veg appreciated - continue Rocephin; Day 6/7   Antimicrobials: Rocephin 11/13/19>> present Azithro 11/13/19 x 1  DVT prophylaxis: Lovenox Code Status: Full Family Communication: none present Disposition Plan:  Status is: Inpatient  Remains inpatient appropriate because:Unsafe d/c plan, IV treatments appropriate due to intensity of illness or inability to take PO and Inpatient level of care appropriate due to severity of illness   Dispo: The patient is from: Home  Anticipated d/c is to: Rehab              Anticipated d/c date is: 1 day              Patient currently is not medically stable to d/c.  Objective: Blood pressure 107/73, pulse 88, temperature 98.4 F (36.9 C), temperature source Oral, resp. rate (!) 21, height 5\' 6"  (1.676 m),  weight 61.2 kg, SpO2 92 %.  Examination: General appearance: alert, cooperative and fatigued Head: Normocephalic, without obvious abnormality, atraumatic Eyes: EOMI Lungs: coarse breath sounds bilaterally, no obvious wheezing Heart: regular rate and rhythm and S1, S2 normal Abdomen: normal findings: bowel sounds normal and soft, non-tender Extremities: no edema Skin: mobility and turgor normal Neurologic: Grossly normal   Consultants:   n/a  Procedures:   n/a  Data Reviewed: I have personally reviewed following labs and imaging studies Results for orders placed or performed during the hospital encounter of 11/12/19 (from the past 24 hour(s))  Glucose, capillary     Status: Abnormal   Collection Time: 11/18/19  4:27 PM  Result Value Ref Range   Glucose-Capillary 235 (H) 70 - 99 mg/dL  Glucose, capillary     Status: Abnormal   Collection Time: 11/18/19  8:50 PM  Result Value Ref Range   Glucose-Capillary 261 (H) 70 - 99 mg/dL  Basic metabolic panel     Status: Abnormal   Collection Time: 11/19/19  4:50 AM  Result Value Ref Range   Sodium 139 135 - 145 mmol/L   Potassium 4.1 3.5 - 5.1 mmol/L   Chloride 100 98 - 111 mmol/L   CO2 29 22 - 32 mmol/L   Glucose, Bld 226 (H) 70 - 99 mg/dL   BUN 25 (H) 8 - 23 mg/dL   Creatinine, Ser 0.67 0.44 - 1.00 mg/dL   Calcium 8.7 (L) 8.9 - 10.3 mg/dL   GFR calc non Af Amer >60 >60 mL/min   GFR calc Af Amer >60 >60 mL/min   Anion gap 10 5 - 15  CBC with Differential/Platelet     Status: Abnormal   Collection Time: 11/19/19  4:50 AM  Result Value Ref Range   WBC 13.5 (H) 4.0 - 10.5 K/uL   RBC 4.32 3.87 - 5.11 MIL/uL   Hemoglobin 12.8 12.0 - 15.0 g/dL   HCT 43.0 36 - 46 %   MCV 99.5 80.0 - 100.0 fL   MCH 29.6 26.0 - 34.0 pg   MCHC 29.8 (L) 30.0 - 36.0 g/dL   RDW 14.0 11.5 - 15.5 %   Platelets 534 (H) 150 - 400 K/uL   nRBC 0.0 0.0 - 0.2 %   Neutrophils Relative % 67 %   Neutro Abs 9.1 (H) 1.7 - 7.7 K/uL   Lymphocytes Relative 11 %    Lymphs Abs 1.5 0.7 - 4.0 K/uL   Monocytes Relative 11 %   Monocytes Absolute 1.4 (H) 0 - 1 K/uL   Eosinophils Relative 3 %   Eosinophils Absolute 0.4 0 - 0 K/uL   Basophils Relative 0 %   Basophils Absolute 0.0 0 - 0 K/uL   Immature Granulocytes 8 %   Abs Immature Granulocytes 1.10 (H) 0.00 - 0.07 K/uL  Magnesium     Status: None   Collection Time: 11/19/19  4:50 AM  Result Value Ref Range   Magnesium 2.1 1.7 - 2.4 mg/dL  Glucose, capillary     Status: Abnormal   Collection Time: 11/19/19  7:54 AM  Result  Value Ref Range   Glucose-Capillary 175 (H) 70 - 99 mg/dL  Glucose, capillary     Status: Abnormal   Collection Time: 11/19/19 11:39 AM  Result Value Ref Range   Glucose-Capillary 233 (H) 70 - 99 mg/dL    Recent Results (from the past 240 hour(s))  Blood culture (routine x 2)     Status: Abnormal   Collection Time: 11/12/19 11:35 PM   Specimen: BLOOD LEFT FOREARM  Result Value Ref Range Status   Specimen Description   Final    BLOOD LEFT FOREARM Performed at [facility] [address], [place], [place] [address]    Special Requests   Final    BOTTLES DRAWN AEROBIC AND ANAEROBIC Blood Culture adequate volume Performed at [facility] [address], [place], [place] [address]    Culture  Setup Time   Final    GRAM POSITIVE COCCI IN PAIRS IN CHAINS IN BOTH AEROBIC AND ANAEROBIC BOTTLES Organism ID to follow CRITICAL RESULT CALLED TO, READ BACK BY AND VERIFIED WITH: Beatrix Shipper MD @1720  11/13/19 EB Performed at [facility], [place] [address] [place], [place] [address]    Culture STREPTOCOCCUS PNEUMONIAE (A)  Final   Report Status 11/15/2019 FINAL  Final   Organism ID, Bacteria STREPTOCOCCUS PNEUMONIAE  Final      Susceptibility   Streptococcus pneumoniae - MIC*    ERYTHROMYCIN >=8 RESISTANT Resistant     LEVOFLOXACIN 0.5 SENSITIVE Sensitive     VANCOMYCIN 0.5 SENSITIVE Sensitive     PENICILLIN (meningitis) 0.25 RESISTANT  Resistant     PENO - penicillin 0.25      PENICILLIN (non-meningitis) 0.25 SENSITIVE Sensitive     PENICILLIN (oral) 0.25 INTERMEDIATE Intermediate     CEFTRIAXONE (non-meningitis) 0.5 SENSITIVE Sensitive     CEFTRIAXONE (meningitis) 0.5 SENSITIVE Sensitive     * STREPTOCOCCUS PNEUMONIAE  Blood Culture ID Panel (Reflexed)     Status: Abnormal   Collection Time: 11/12/19 11:35 PM  Result Value Ref Range Status   Enterococcus species NOT DETECTED NOT DETECTED Final   Listeria monocytogenes NOT DETECTED NOT DETECTED Final   Staphylococcus species NOT DETECTED NOT DETECTED Final   Staphylococcus aureus (BCID) NOT DETECTED NOT DETECTED Final   Streptococcus species DETECTED (A) NOT DETECTED Final    Comment: RESULT CALLED TO, READ BACK BY AND VERIFIED WITH: Beatrix Shipper MD @1720  11/13/19 EB    Streptococcus agalactiae NOT DETECTED NOT DETECTED Final   Streptococcus pneumoniae DETECTED (A) NOT DETECTED Final    Comment: RESULT CALLED TO, READ BACK BY AND VERIFIED WITH: Beatrix Shipper MD @1720  11/13/19 EB    Streptococcus pyogenes NOT DETECTED NOT DETECTED Final   Acinetobacter baumannii NOT DETECTED NOT DETECTED Final   Enterobacteriaceae species NOT DETECTED NOT DETECTED Final   Enterobacter cloacae complex NOT DETECTED NOT DETECTED Final   Escherichia coli NOT DETECTED NOT DETECTED Final   Klebsiella oxytoca NOT DETECTED NOT DETECTED Final   Klebsiella pneumoniae NOT DETECTED NOT DETECTED Final   Proteus species NOT DETECTED NOT DETECTED Final   Serratia marcescens NOT DETECTED NOT DETECTED Final   Haemophilus influenzae NOT DETECTED NOT DETECTED Final   Neisseria meningitidis NOT DETECTED NOT DETECTED Final   Pseudomonas aeruginosa NOT DETECTED NOT DETECTED Final   Candida albicans NOT DETECTED NOT DETECTED Final   Candida glabrata NOT DETECTED NOT DETECTED Final   Candida krusei NOT DETECTED NOT DETECTED Final   Candida parapsilosis NOT DETECTED NOT DETECTED Final  Candida  tropicalis NOT DETECTED NOT DETECTED Final    Comment: Performed at [facility], [place] [address], [place], [place] [address]  Blood culture (routine x 2)     Status: Abnormal   Collection Time: 11/12/19 11:40 PM   Specimen: BLOOD  Result Value Ref Range Status   Specimen Description BLOOD RIGHT ANTECUBITAL  Final   Special Requests   Final    BOTTLES DRAWN AEROBIC AND ANAEROBIC Blood Culture adequate volume   Culture  Setup Time   Final    GRAM POSITIVE COCCI IN PAIRS IN CHAINS IN BOTH AEROBIC AND ANAEROBIC BOTTLES CRITICAL VALUE NOTED.  VALUE IS CONSISTENT WITH PREVIOUSLY REPORTED AND CALLED VALUE.    Culture (A)  Final    STREPTOCOCCUS PNEUMONIAE SUSCEPTIBILITIES PERFORMED ON PREVIOUS CULTURE WITHIN THE LAST 5 DAYS.    Report Status 11/15/2019 FINAL  Final  SARS Coronavirus 2 by RT PCR (hospital order, performed in [facility] hospital lab) Nasopharyngeal Nasopharyngeal Swab     Status: None   Collection Time: 11/13/19  1:00 AM   Specimen: Nasopharyngeal Swab  Result Value Ref Range Status   SARS Coronavirus 2 NEGATIVE NEGATIVE Final    Comment: (NOTE) SARS-CoV-2 target nucleic acids are NOT DETECTED.  The SARS-CoV-2 RNA is generally detectable in upper and lower respiratory specimens during the acute phase of infection. The lowest concentration of SARS-CoV-2 viral copies this assay can detect is 250 copies / mL. A negative result does not preclude SARS-CoV-2 infection and should not be used as the sole basis for treatment or other patient management decisions.  A negative result may occur with improper specimen collection / handling, submission of specimen other than nasopharyngeal swab, presence of viral mutation(s) within the areas targeted by this assay, and inadequate number of viral copies (<250 copies / mL). A negative result must be combined with clinical observations, patient history, and epidemiological information.  Fact Sheet for Patients:     StrictlyIdeas.no  Fact Sheet for Healthcare Providers: BankingDealers.co.za  This test is not yet approved or  cleared by the [place] FDA and has been authorized for detection and/or diagnosis of SARS-CoV-2 by FDA under an Emergency Use Authorization (EUA).  This EUA will remain in effect (meaning this test can be used) for the duration of the COVID-19 declaration under Section 564(b)(1) of the Act, 21 U.S.C. section 360bbb-3(b)(1), unless the authorization is terminated or revoked sooner.  Performed at [facility], [place] [address], [place], [place] [address]   Urine culture     Status: Abnormal   Collection Time: 11/13/19  4:56 AM   Specimen: Urine, Clean Catch  Result Value Ref Range Status   Specimen Description   Final    URINE, CLEAN CATCH Performed at [facility], [place] [address] [place], [place] [address]    Special Requests   Final    NONE Performed at [facility], [place] [address], [place], [place] [address]    Culture (A)  Final    <10,000 COLONIES/mL INSIGNIFICANT GROWTH Performed at [facility] [address] [place], [place] [address]    Report Status 11/14/2019 FINAL  Final  Culture, sputum-assessment     Status: None   Collection Time: 11/13/19  7:51 AM   Specimen: Expectorated Sputum  Result Value Ref Range Status   Specimen Description EXPECTORATED SPUTUM  Final   Special Requests NONE  Final   Sputum evaluation   Final    Sputum  specimen not acceptable for testing.  Please recollect.   lonily, k by jolande  Performed at [facility], [place] [address] [place], [place] [address]    Report Status 11/13/2019 FINAL  Final  Culture, blood (routine x 2)     Status: None   Collection Time: 11/14/19 11:50 AM   Specimen: BLOOD LEFT HAND  Result Value Ref Range Status   Specimen Description   Final    BLOOD LEFT  HAND Performed at [facility] [address] [place], [place] [address]    Special Requests   Final    BOTTLES DRAWN AEROBIC AND ANAEROBIC Blood Culture adequate volume Performed at [facility] [address] [place], [place] [address]    Culture   Final    NO GROWTH 5 DAYS Performed at [facility], [name] [address] [place], [place] [address]    Report Status 11/19/2019 FINAL  Final  Culture, blood (routine x 2)     Status: Abnormal   Collection Time: 11/14/19 11:56 AM   Specimen: BLOOD  Result Value Ref Range Status   Specimen Description   Final    BLOOD BLOOD RIGHT FOREARM Performed at [facility] [address], [place], [place] [address]    Special Requests   Final    BOTTLES DRAWN AEROBIC AND ANAEROBIC Blood Culture adequate volume Performed at [facility] [address] [place], [place] [address]    Culture  Setup Time   Final    GRAM POSITIVE COCCI AEROBIC BOTTLE ONLY CRITICAL VALUE NOTED.  VALUE IS CONSISTENT WITH PREVIOUSLY REPORTED AND CALLED VALUE.    Culture (A)  Final    STAPHYLOCOCCUS SPECIES (COAGULASE NEGATIVE) THE SIGNIFICANCE OF ISOLATING THIS ORGANISM FROM A SINGLE SET OF BLOOD CULTURES WHEN MULTIPLE SETS ARE DRAWN IS UNCERTAIN. PLEASE NOTIFY THE MICROBIOLOGY DEPARTMENT WITHIN ONE WEEK IF SPECIATION AND SENSITIVITIES ARE REQUIRED. Performed at [facility], [place] [address] [place], [place] [address]    Report Status 11/18/2019 FINAL  Final  Culture, blood (routine x 2)     Status: None (Preliminary result)   Collection Time: 11/16/19  4:00 PM   Specimen: BLOOD  Result Value Ref Range Status   Specimen Description   Final    BLOOD LEFT HAND Performed at [facility] [address] [place], [place] [address]    Special Requests   Final    BOTTLES DRAWN AEROBIC ONLY Blood Culture adequate volume Performed at [facility] [address] [place], [place] [address]    Culture   Final    NO GROWTH 3 DAYS Performed at [facility], [place] [address] [place], [place] [address]    Report Status PENDING  Incomplete  Culture, blood (routine x 2)     Status: None (Preliminary result)   Collection Time: 11/16/19  4:00 PM   Specimen: BLOOD  Result Value Ref Range Status   Specimen Description   Final    BLOOD RIGHT HAND Performed at [facility] [address] [place], [place] [address]    Special Requests   Final    BOTTLES DRAWN AEROBIC ONLY Blood Culture adequate volume Performed at [facility] [address], [place], [place] [address]    Culture   Final    NO GROWTH 3 DAYS Performed at [facility], [place] [address], [place], [place] [address]  Report Status PENDING  Incomplete     Radiology Studies: DG CHEST PORT 1 VIEW  Result Date: 11/19/2019 CLINICAL DATA:  Pneumonia, COPD EXAM: PORTABLE CHEST 1 VIEW COMPARISON:  11/13/2019 chest radiograph. FINDINGS: Surgical hardware from ACDF is partially visualized. Stable cardiomediastinal silhouette with top-normal heart size. No pneumothorax. No pleural effusion. Hazy opacities in the mid to lower left lung appear similar. IMPRESSION: Stable hazy opacities in the mid to lower left lung, compatible with pneumonia. Continued chest radiograph follow-up suggested. Electronically Signed   By: Ilona Sorrel M.D.   On: 11/19/2019 11:56   DG CHEST PORT 1 VIEW  Final Result    CT ANGIO CHEST PE W OR WO CONTRAST  Final Result    DG Chest Portable 1 View  Final Result       Scheduled Meds: . acetaZOLAMIDE  250 mg Oral Daily  . aspirin  81 mg Oral Daily  . budesonide (PULMICORT) nebulizer solution  0.25 mg Nebulization BID  . enoxaparin (LOVENOX) injection  40 mg Subcutaneous Q24H  . insulin aspart  0-5 Units Subcutaneous QHS  . insulin aspart  0-9 Units Subcutaneous TID WC  .  ipratropium-albuterol  3 mL Nebulization BID  . losartan  50 mg Oral Daily  . multivitamin with minerals  1 tablet Oral Daily  . rosuvastatin  20 mg Oral QPC breakfast   PRN Meds: albuterol, ondansetron **OR** ondansetron (ZOFRAN) IV Continuous Infusions: . cefTRIAXone (ROCEPHIN)  IV 2 g (11/18/19 1704)      LOS: 6 days  Time spent: Greater than 50% of the 35 minute visit was spent in counseling/coordination of care for the patient as laid out in the A&P.   Dwyane Dee, MD [facility] 11/19/2019, 1:40 PM   Contact via secure chat.  To contact the attending provider between 7A-7P or the covering provider during after hours 7P-7A, please log into the web site [email or web address] and access using universal [place] password for that web site. If you do not have the password, please call the hospital operator.

## 2019-11-20 LAB — BASIC METABOLIC PANEL
Anion gap: 9 (ref 5–15)
BUN: 26 mg/dL — ABNORMAL HIGH (ref 8–23)
CO2: 29 mmol/L (ref 22–32)
Calcium: 8.4 mg/dL — ABNORMAL LOW (ref 8.9–10.3)
Chloride: 96 mmol/L — ABNORMAL LOW (ref 98–111)
Creatinine, Ser: 0.49 mg/dL (ref 0.44–1.00)
GFR calc Af Amer: >60 mL/min (ref >60)
GFR calc non Af Amer: >60 mL/min (ref >60)
Glucose, Bld: 196 mg/dL — ABNORMAL HIGH (ref 70–99)
Potassium: 4 mmol/L (ref 3.5–5.1)
Sodium: 134 mmol/L — ABNORMAL LOW (ref 135–145)

## 2019-11-20 LAB — CBC WITH DIFFERENTIAL/PLATELET
Abs Immature Granulocytes: 0.65 10*3/uL — ABNORMAL HIGH (ref 0.00–0.07)
Basophils Absolute: 0 10*3/uL (ref 0.0–0.1)
Basophils Relative: 0 %
Eosinophils Absolute: 0.3 10*3/uL (ref 0.0–0.5)
Eosinophils Relative: 3 %
HCT: 42.2 % (ref 36.0–46.0)
Hemoglobin: 12.5 g/dL (ref 12.0–15.0)
Immature Granulocytes: 6 %
Lymphocytes Relative: 10 %
Lymphs Abs: 1.1 10*3/uL (ref 0.7–4.0)
MCH: 29.9 pg (ref 26.0–34.0)
MCHC: 29.6 g/dL — ABNORMAL LOW (ref 30.0–36.0)
MCV: 101 fL — ABNORMAL HIGH (ref 80.0–100.0)
Monocytes Absolute: 1.1 10*3/uL — ABNORMAL HIGH (ref 0.1–1.0)
Monocytes Relative: 9 %
Neutro Abs: 8.5 10*3/uL — ABNORMAL HIGH (ref 1.7–7.7)
Neutrophils Relative %: 72 %
Platelets: 496 10*3/uL — ABNORMAL HIGH (ref 150–400)
RBC: 4.18 MIL/uL (ref 3.87–5.11)
RDW: 13.9 % (ref 11.5–15.5)
WBC: 11.6 10*3/uL — ABNORMAL HIGH (ref 4.0–10.5)
nRBC: 0 % (ref 0.0–0.2)

## 2019-11-20 LAB — GLUCOSE, CAPILLARY
Glucose-Capillary: 175 mg/dL — ABNORMAL HIGH (ref 70–99)
Glucose-Capillary: 246 mg/dL — ABNORMAL HIGH (ref 70–99)

## 2019-11-20 LAB — MAGNESIUM: Magnesium: 2.1 mg/dL (ref 1.7–2.4)

## 2019-11-20 NOTE — Progress Notes (Signed)
Patient discharged home with daughter in law [name]. Dicharge instructions reviewed with patient and Ms. Mary Sella. All questions were answered. Both parties verbalized understanding.

## 2019-11-20 NOTE — Discharge Summary (Signed)
Physician Discharge Summary  Modelle Hoskie VQQ:[id number] DOB: [birth year] DOA: 11/12/2019  PCP: [name], MD  Admit date: 11/12/2019 Discharge date: 11/20/2019  Admitted From: Home Disposition: Home with home health Discharging physician: Dwyane Dee, MD  Recommendations for Outpatient Follow-up:  1. Recommending ongoing smoking cessation but patient had no desire to quit during hospitalization 2. Compliance with oxygen and weaning as able  Patient discharged to home with home health in Discharge Condition: stable CODE STATUS: Full Diet recommendation:  Diet Orders (From admission, onward)    Start     Ordered   11/20/19 0000  Diet Carb Modified        11/20/19 1129   11/13/19 0328  Diet heart healthy/carb modified Room service appropriate? Yes; Fluid consistency: Thin  Diet effective now       Question Answer Comment  Diet-HS Snack? Nothing   Room service appropriate? Yes   Fluid consistency: Thin      11/13/19 0328          Hospital Course: Briana Barr is a 79 y.o. female with history of chronic respiratory failure secondary to COPD on 4 L oxygen, hypertension, diabetes mellitus type 2, peripheral vascular disease, previous history of DVT who presented to the ER because of worsening shortness of breath over the last week.  Patient states initially she was having productive cough and has been feeling increasingly weak.  Denies chest pain nausea vomiting or abdominal pain.  Since patient was progressively getting weak patient was brought to the ER.   In the ER patient was short of breath with chest x-ray showing infiltrates concerning for pneumonia versus edema.  Lab work was significant for WBC count of 21,000 lactic acid of 2.2 blood glucose 244 potassium of 3.3 EKG shows tachycardia with nonspecific ST-T changes in the inferior leads.  Patient was started on empiric antibiotics for pneumonia also was started on nebulizer and steroids for possible COPD exacerbation.  Covid  test is negative.  Her strep pna urinary Ag was positive and her admission blood cultures also grew 4/4 bottles with Strep Pna. She was placed on CTX on admission for PNA coverage initially as well.  Repeat blood cultures were drawn on 11/14/2019.  Cultures from 8/3 grew 1/4 CoNS considered contaminate. She underwent TTE on 8/6 which was negative for vegetation. Noted mild LVH, EF > 75%, Gr 1 DD.   She was also evaluated by physical therapy during hospitalization due to her weakness and deconditioning.  She also was amenable for placement in rehab at time of discharge when available.  After completing her course of antibiotics, patient and family had changed their mind and she desired to go home with home health instead.  Home health was then arranged at discharge.   Acute respiratory failure with hypoxia (HCC) - patient on chronic O2 at home, 4L - continue O2  COPD (chronic obstructive pulmonary disease) (HCC) - no s/s exacerbation - continue to monitor   Diabetes mellitus type 2 in nonobese ([place]) - continue SSI and CBG monitoring   Essential hypertension - continue current regimen   CAP (community acquired pneumonia) - noted on CXR; also positive strep pna urinary Ag; has also translocated to blood; see bacteremia -Completed antibiotic course in hospital -CXR repeated on 11/19/2019, shows improvement in left-sided pneumonia  Bacteremia - admission blood cultures growing 4/4 Strep pna - suspected seeding from lungs - repeat blood cultures on 8/3 considered negative (1/4 noted with CoNS consistent with contaminate) - repeat cultures again on  8/5; will go ahead and obtain TTE to rule out veg as well: no veg appreciated -7-day course of Rocephin completed during hospitalization    The patient's chronic medical conditions were treated accordingly per the patient's home medication regimen except as noted.  On day of discharge, patient was felt deemed stable for discharge.  Patient/family member advised to call PCP or come back to ER if needed.   Discharge Diagnoses:   Principal Diagnosis: Acute respiratory failure with hypoxia [facility])  Active Hospital Problems   Diagnosis Date Noted  . Acute respiratory failure with hypoxia ([place]) 11/13/2019  . Bacteremia 11/14/2019    Priority: High  . COPD (chronic obstructive pulmonary disease) ([place]) 11/13/2019  . Diabetes mellitus type 2 in nonobese ([place]) 11/13/2019  . Essential hypertension 11/13/2019    Resolved Hospital Problems   Diagnosis Date Noted Date Resolved  . CAP (community acquired pneumonia) 11/14/2019 11/16/2019    Priority: High    Discharge Instructions    Diet Carb Modified   Complete by: As directed    Increase activity slowly   Complete by: As directed      Allergies as of 11/20/2019      Reactions   Vioxx [rofecoxib] Swelling      Medication List    TAKE these medications   acetaZOLAMIDE 250 MG tablet Commonly known as: DIAMOX TAKE 1 TABLET BY MOUTH EVERY DAY Notes to patient: 11/21/19   albuterol 108 (90 Base) MCG/ACT inhaler Commonly known as: ProAir HFA Inhale 2 puffs into the lungs every 6 (six) hours as needed for wheezing or shortness of breath.   aspirin 81 MG tablet Take 81 mg by mouth daily. Notes to patient: 11/21/19   Centrum Silver 50+Women Tabs Take 1 tablet by mouth daily. Notes to patient: 11/21/19   escitalopram 10 MG tablet Commonly known as: LEXAPRO TAKE 1 TABLET BY MOUTH EVERY DAY Notes to patient: 11/21/19   glimepiride 1 MG tablet Commonly known as: AMARYL TAKE 1 TABLET BY MOUTH EVERY DAY IN THE MORNING Notes to patient: 11/21/19   HYDROcodone-acetaminophen 5-325 MG tablet Commonly known as: Norco Take 1 tablet by mouth 4 (four) times daily.   ipratropium-albuterol 0.5-2.5 (3) MG/3ML Soln Commonly known as: DUONEB Take 3 mLs by nebulization every 6 (six) hours as needed. What changed: reasons to take this   losartan 50 MG tablet Commonly known  as: COZAAR Take 1 tablet (50 mg total) by mouth daily. Notes to patient: 11/21/19   metFORMIN 500 MG tablet Commonly known as: GLUCOPHAGE TAKE 1 TABLET BY MOUTH TWICE A DAY WITH MEALS Notes to patient: 11/20/19   omeprazole 20 MG capsule Commonly known as: PRILOSEC TAKE 1 CAPSULE BY MOUTH TWICE A DAY BEFORE A MEAL Notes to patient: 11/20/19   rosuvastatin 20 MG tablet Commonly known as: CRESTOR TAKE 1 TABLET BY MOUTH EVERYDAY AT BEDTIME What changed: See the new instructions. Notes to patient: 11/20/19   Trelegy Ellipta 100-62.5-25 MCG/INH Aepb Generic drug: Fluticasone-Umeclidin-Vilant Inhale 1 Inhaler into the lungs daily. Notes to patient: 11/21/19   Vitamin D3 250 MCG (10000 UT) capsule Take 1 capsule (10,000 Units total) by mouth daily. Notes to patient: 11/21/19            Durable Medical Equipment  (From admission, onward)         Start     Ordered   11/20/19 1128  DME Tub Bench  Once        11/20/19 1129   11/20/19 1128  DME [place] Rile  Once       Question Answer Comment  Walker: With [place]   Patient needs a walker to treat with the following condition Acute respiratory failure with hypoxia [facility])   Patient needs a walker to treat with the following condition Impaired mobility      11/20/19 1129          Allergies  Allergen Reactions  . Vioxx [Rofecoxib] Swelling    Discharge Exam: BP 102/67 (BP Location: Left Arm)   Pulse 86   Temp 98.3 F (36.8 C) (Oral)   Resp (!) 22   Ht 5\' 6"  (1.676 m)   Wt 61.2 kg   SpO2 (!) 89%   BMI 21.79 kg/m  General appearance: alert, cooperative and fatigued Head: Normocephalic, without obvious abnormality, atraumatic Eyes: EOMI Lungs: coarse breath sounds bilaterally, no obvious wheezing Heart: regular rate and rhythm and S1, S2 normal Abdomen: normal findings: bowel sounds normal and soft, non-tender Extremities: no edema Skin: mobility and turgor normal Neurologic: Grossly normal   The results of  significant diagnostics from this hospitalization (including imaging, microbiology, ancillary and laboratory) are listed below for reference.   Microbiology: Recent Results (from the past 240 hour(s))  Blood culture (routine x 2)     Status: Abnormal   Collection Time: 11/12/19 11:35 PM   Specimen: BLOOD LEFT FOREARM  Result Value Ref Range Status   Specimen Description   Final    BLOOD LEFT FOREARM Performed at [facility] [address], [place], [place] [address]    Special Requests   Final    BOTTLES DRAWN AEROBIC AND ANAEROBIC Blood Culture adequate volume Performed at [facility] [address], [place], [place] [address]    Culture  Setup Time   Final    GRAM POSITIVE COCCI IN PAIRS IN CHAINS IN BOTH AEROBIC AND ANAEROBIC BOTTLES Organism ID to follow CRITICAL RESULT CALLED TO, READ BACK BY AND VERIFIED WITH: Beatrix Shipper MD @1720  11/13/19 EB Performed at [facility], [place] [address], [place], [place] [address]    Culture STREPTOCOCCUS PNEUMONIAE (A)  Final   Report Status 11/15/2019 FINAL  Final   Organism ID, Bacteria STREPTOCOCCUS PNEUMONIAE  Final      Susceptibility   Streptococcus pneumoniae - MIC*    ERYTHROMYCIN >=8 RESISTANT Resistant     LEVOFLOXACIN 0.5 SENSITIVE Sensitive     VANCOMYCIN 0.5 SENSITIVE Sensitive     PENICILLIN (meningitis) 0.25 RESISTANT Resistant     PENO - penicillin 0.25      PENICILLIN (non-meningitis) 0.25 SENSITIVE Sensitive     PENICILLIN (oral) 0.25 INTERMEDIATE Intermediate     CEFTRIAXONE (non-meningitis) 0.5 SENSITIVE Sensitive     CEFTRIAXONE (meningitis) 0.5 SENSITIVE Sensitive     * STREPTOCOCCUS PNEUMONIAE  Blood Culture ID Panel (Reflexed)     Status: Abnormal   Collection Time: 11/12/19 11:35 PM  Result Value Ref Range Status   Enterococcus species NOT DETECTED NOT DETECTED Final   Listeria monocytogenes NOT DETECTED NOT DETECTED Final   Staphylococcus species NOT DETECTED  NOT DETECTED Final   Staphylococcus aureus (BCID) NOT DETECTED NOT DETECTED Final   Streptococcus species DETECTED (A) NOT DETECTED Final    Comment: RESULT CALLED TO, READ BACK BY AND VERIFIED WITH: Beatrix Shipper MD @1720  11/13/19 EB    Streptococcus agalactiae NOT DETECTED NOT DETECTED Final   Streptococcus pneumoniae DETECTED (A) NOT DETECTED Final    Comment: RESULT CALLED TO, READ BACK BY AND VERIFIED  WITH: Beatrix Shipper MD @1720  11/13/19 EB    Streptococcus pyogenes NOT DETECTED NOT DETECTED Final   Acinetobacter baumannii NOT DETECTED NOT DETECTED Final   Enterobacteriaceae species NOT DETECTED NOT DETECTED Final   Enterobacter cloacae complex NOT DETECTED NOT DETECTED Final   Escherichia coli NOT DETECTED NOT DETECTED Final   Klebsiella oxytoca NOT DETECTED NOT DETECTED Final   Klebsiella pneumoniae NOT DETECTED NOT DETECTED Final   Proteus species NOT DETECTED NOT DETECTED Final   Serratia marcescens NOT DETECTED NOT DETECTED Final   Haemophilus influenzae NOT DETECTED NOT DETECTED Final   Neisseria meningitidis NOT DETECTED NOT DETECTED Final   Pseudomonas aeruginosa NOT DETECTED NOT DETECTED Final   Candida albicans NOT DETECTED NOT DETECTED Final   Candida glabrata NOT DETECTED NOT DETECTED Final   Candida krusei NOT DETECTED NOT DETECTED Final   Candida parapsilosis NOT DETECTED NOT DETECTED Final   Candida tropicalis NOT DETECTED NOT DETECTED Final    Comment: Performed at [facility], [place] [address] [place], [place] [address]  Blood culture (routine x 2)     Status: Abnormal   Collection Time: 11/12/19 11:40 PM   Specimen: BLOOD  Result Value Ref Range Status   Specimen Description BLOOD RIGHT ANTECUBITAL  Final   Special Requests   Final    BOTTLES DRAWN AEROBIC AND ANAEROBIC Blood Culture adequate volume   Culture  Setup Time   Final    GRAM POSITIVE COCCI IN PAIRS IN CHAINS IN BOTH AEROBIC AND ANAEROBIC BOTTLES CRITICAL VALUE NOTED.  VALUE IS  CONSISTENT WITH PREVIOUSLY REPORTED AND CALLED VALUE.    Culture (A)  Final    STREPTOCOCCUS PNEUMONIAE SUSCEPTIBILITIES PERFORMED ON PREVIOUS CULTURE WITHIN THE LAST 5 DAYS.    Report Status 11/15/2019 FINAL  Final  SARS Coronavirus 2 by RT PCR (hospital order, performed in [facility] hospital lab) Nasopharyngeal Nasopharyngeal Swab     Status: None   Collection Time: 11/13/19  1:00 AM   Specimen: Nasopharyngeal Swab  Result Value Ref Range Status   SARS Coronavirus 2 NEGATIVE NEGATIVE Final    Comment: (NOTE) SARS-CoV-2 target nucleic acids are NOT DETECTED.  The SARS-CoV-2 RNA is generally detectable in upper and lower respiratory specimens during the acute phase of infection. The lowest concentration of SARS-CoV-2 viral copies this assay can detect is 250 copies / mL. A negative result does not preclude SARS-CoV-2 infection and should not be used as the sole basis for treatment or other patient management decisions.  A negative result may occur with improper specimen collection / handling, submission of specimen other than nasopharyngeal swab, presence of viral mutation(s) within the areas targeted by this assay, and inadequate number of viral copies (<250 copies / mL). A negative result must be combined with clinical observations, patient history, and epidemiological information.  Fact Sheet for Patients:   StrictlyIdeas.no  Fact Sheet for Healthcare Providers: BankingDealers.co.za  This test is not yet approved or  cleared by the [place] FDA and has been authorized for detection and/or diagnosis of SARS-CoV-2 by FDA under an Emergency Use Authorization (EUA).  This EUA will remain in effect (meaning this test can be used) for the duration of the COVID-19 declaration under Section 564(b)(1) of the Act, 21 U.S.C. section 360bbb-3(b)(1), unless the authorization is terminated or revoked sooner.  Performed at [facility], [place] [address] [place], [place] [address]   Urine culture     Status: Abnormal   Collection Time: 11/13/19  4:56  AM   Specimen: Urine, Clean Catch  Result Value Ref Range Status   Specimen Description   Final    URINE, CLEAN CATCH Performed at [facility], [place] [address], [place], [place] [address]    Special Requests   Final    NONE Performed at [facility], [place] [address] [place], [place] [address]    Culture (A)  Final    <10,000 COLONIES/mL INSIGNIFICANT GROWTH Performed at [facility] [address] [place], [place] [address]    Report Status 11/14/2019 FINAL  Final  Culture, sputum-assessment     Status: None   Collection Time: 11/13/19  7:51 AM   Specimen: Expectorated Sputum  Result Value Ref Range Status   Specimen Description EXPECTORATED SPUTUM  Final   Special Requests NONE  Final   Sputum evaluation   Final    Sputum specimen not acceptable for testing.  Please recollect.   lonily, k by jolande  Performed at [facility], [place] [address] [place], [place] [address]    Report Status 11/13/2019 FINAL  Final  Culture, blood (routine x 2)     Status: None   Collection Time: 11/14/19 11:50 AM   Specimen: BLOOD LEFT HAND  Result Value Ref Range Status   Specimen Description   Final    BLOOD LEFT HAND Performed at [facility] [address] [place], [place] [address]    Special Requests   Final    BOTTLES DRAWN AEROBIC AND ANAEROBIC Blood Culture adequate volume Performed at [facility] [address], [place], [place] [address]    Culture   Final    NO GROWTH 5 DAYS Performed at [facility], [place] [address], [place], [place] [address]    Report Status 11/19/2019 FINAL  Final  Culture, blood (routine x 2)     Status: Abnormal   Collection Time: 11/14/19 11:56 AM   Specimen: BLOOD  Result Value Ref Range Status   Specimen  Description   Final    BLOOD BLOOD RIGHT FOREARM Performed at [facility] [address], [place], [place] [address]    Special Requests   Final    BOTTLES DRAWN AEROBIC AND ANAEROBIC Blood Culture adequate volume Performed at [facility] [address] [place], [place] [address]    Culture  Setup Time   Final    GRAM POSITIVE COCCI AEROBIC BOTTLE ONLY CRITICAL VALUE NOTED.  VALUE IS CONSISTENT WITH PREVIOUSLY REPORTED AND CALLED VALUE.    Culture (A)  Final    STAPHYLOCOCCUS SPECIES (COAGULASE NEGATIVE) THE SIGNIFICANCE OF ISOLATING THIS ORGANISM FROM A SINGLE SET OF BLOOD CULTURES WHEN MULTIPLE SETS ARE DRAWN IS UNCERTAIN. PLEASE NOTIFY THE MICROBIOLOGY DEPARTMENT WITHIN ONE WEEK IF SPECIATION AND SENSITIVITIES ARE REQUIRED. Performed at [facility], [place] [address] [place], [place] [address]    Report Status 11/18/2019 FINAL  Final  Culture, blood (routine x 2)     Status: None (Preliminary result)   Collection Time: 11/16/19  4:00 PM   Specimen: BLOOD  Result Value Ref Range Status   Specimen Description   Final    BLOOD LEFT HAND Performed at [facility] [address], [place], [place] [address]    Special Requests   Final    BOTTLES DRAWN AEROBIC ONLY Blood Culture adequate volume Performed at [facility] [address] [place],  [address]  Culture   Final    NO GROWTH 4 DAYS Performed at [facility], [place] [address] [place], [place] [address]    Report Status PENDING  Incomplete  Culture, blood (routine x 2)     Status: None (Preliminary result)   Collection Time: 11/16/19  4:00 PM   Specimen: BLOOD  Result Value Ref Range Status   Specimen Description   Final    BLOOD RIGHT HAND Performed at [facility] [address], [place], [name] [address]    Special Requests   Final    BOTTLES DRAWN AEROBIC ONLY Blood Culture adequate  volume Performed at [facility] [address] [place], [place] [address]    Culture   Final    NO GROWTH 4 DAYS Performed at [facility], [place] [address], [place],  [address]    Report Status PENDING  Incomplete     Labs: BNP (last 3 results) Recent Labs    11/13/19 0454  BNP 500.3*   Basic Metabolic Panel: Recent Labs  Lab 11/16/19 0417 11/17/19 0417 11/18/19 0442 11/19/19 0450 11/20/19 0434  NA 141 137 141 139 134*  K 4.3 3.7 4.0 4.1 4.0  CL 106 98 101 100 96*  CO2 27 30 29 29 29   GLUCOSE 156* 257* 165* 226* 196*  BUN 24* 27* 27* 25* 26*  CREATININE 0.70 0.61 0.61 0.67 0.49  CALCIUM 8.9 9.2 9.1 8.7* 8.4*  MG 2.3 2.1 2.2 2.1 2.1   Liver Function Tests: No results for input(s): AST, ALT, ALKPHOS, BILITOT, PROT, ALBUMIN in the last 168 hours. No results for input(s): LIPASE, AMYLASE in the last 168 hours. No results for input(s): AMMONIA in the last 168 hours. CBC: Recent Labs  Lab 11/16/19 0417 11/17/19 0417 11/18/19 0442 11/19/19 0450 11/20/19 0434  WBC 11.9* 15.3* 13.4* 13.5* 11.6*  NEUTROABS 8.4* 10.7* 8.2* 9.1* 8.5*  HGB 13.3 12.6 13.0 12.8 12.5  HCT 44.9 42.0 44.0 43.0 42.2  MCV 101.1* 99.5 99.1 99.5 101.0*  PLT 474* 497* 539* 534* 496*   Cardiac Enzymes: No results for input(s): CKTOTAL, CKMB, CKMBINDEX, TROPONINI in the last 168 hours. BNP: Invalid input(s): POCBNP CBG: Recent Labs  Lab 11/19/19 1139 11/19/19 1655 11/19/19 2135 11/20/19 0814 11/20/19 1149  GLUCAP 233* 233* 322* 175* 246*   D-Dimer No results for input(s): DDIMER in the last 72 hours. Hgb A1c No results for input(s): HGBA1C in the last 72 hours. Lipid Profile No results for input(s): CHOL, HDL, LDLCALC, TRIG, CHOLHDL, LDLDIRECT in the last 72 hours. Thyroid function studies No results for input(s): TSH, T4TOTAL, T3FREE, THYROIDAB in the last 72 hours.  Invalid input(s): FREET3 Anemia work up No results for input(s): VITAMINB12,  FOLATE, FERRITIN, TIBC, IRON, RETICCTPCT in the last 72 hours. Urinalysis    Component Value Date/Time   COLORURINE YELLOW 11/13/2019 0456   APPEARANCEUR CLEAR 11/13/2019 0456   LABSPEC 1.016 11/13/2019 0456   PHURINE 5.0 11/13/2019 0456   GLUCOSEU NEGATIVE 11/13/2019 0456   HGBUR NEGATIVE 11/13/2019 0456   BILIRUBINUR NEGATIVE 11/13/2019 0456   KETONESUR NEGATIVE 11/13/2019 0456   PROTEINUR 30 (A) 11/13/2019 0456   NITRITE NEGATIVE 11/13/2019 0456   LEUKOCYTESUR TRACE (A) 11/13/2019 0456   Sepsis Labs Invalid input(s): PROCALCITONIN,  WBC,  LACTICIDVEN Microbiology Recent Results (from the past 240 hour(s))  Blood culture (routine x 2)     Status: Abnormal   Collection Time: 11/12/19 11:35 PM   Specimen: BLOOD LEFT FOREARM  Result Value Ref Range Status   Specimen Description   Final    BLOOD LEFT FOREARM Performed at [facility] [address] [place], [place] [address]    Special Requests   Final    BOTTLES DRAWN AEROBIC AND ANAEROBIC Blood Culture adequate volume Performed at [facility] [address], [place], [place] [address]    Culture  Setup Time   Final    GRAM POSITIVE COCCI IN PAIRS IN CHAINS IN BOTH AEROBIC AND ANAEROBIC BOTTLES Organism ID to follow CRITICAL RESULT CALLED TO, READ BACK BY AND VERIFIED WITH: Beatrix Shipper MD @1720  11/13/19 EB Performed at [facility], [place] [address], [place], [place] [address]    Culture STREPTOCOCCUS PNEUMONIAE (A)  Final   Report Status 11/15/2019 FINAL  Final   Organism ID, Bacteria STREPTOCOCCUS PNEUMONIAE  Final      Susceptibility   Streptococcus pneumoniae - MIC*    ERYTHROMYCIN >=8 RESISTANT Resistant     LEVOFLOXACIN 0.5 SENSITIVE Sensitive     VANCOMYCIN 0.5 SENSITIVE Sensitive     PENICILLIN (meningitis) 0.25 RESISTANT Resistant     PENO - penicillin 0.25      PENICILLIN (non-meningitis) 0.25 SENSITIVE Sensitive     PENICILLIN (oral) 0.25 INTERMEDIATE  Intermediate     CEFTRIAXONE (non-meningitis) 0.5 SENSITIVE Sensitive     CEFTRIAXONE (meningitis) 0.5 SENSITIVE Sensitive     * STREPTOCOCCUS PNEUMONIAE  Blood Culture ID Panel (Reflexed)     Status: Abnormal   Collection Time: 11/12/19 11:35 PM  Result Value Ref Range Status   Enterococcus species NOT DETECTED NOT DETECTED Final   Listeria monocytogenes NOT DETECTED NOT DETECTED Final   Staphylococcus species NOT DETECTED NOT DETECTED Final   Staphylococcus aureus (BCID) NOT DETECTED NOT DETECTED Final   Streptococcus species DETECTED (A) NOT DETECTED Final    Comment: RESULT CALLED TO, READ BACK BY AND VERIFIED WITH: Beatrix Shipper MD @1720  11/13/19 EB    Streptococcus agalactiae NOT DETECTED NOT DETECTED Final   Streptococcus pneumoniae DETECTED (A) NOT DETECTED Final    Comment: RESULT CALLED TO, READ BACK BY AND VERIFIED WITH: Beatrix Shipper MD @1720  11/13/19 EB    Streptococcus pyogenes NOT DETECTED NOT DETECTED Final   Acinetobacter baumannii NOT DETECTED NOT DETECTED Final   Enterobacteriaceae species NOT DETECTED NOT DETECTED Final   Enterobacter cloacae complex NOT DETECTED NOT DETECTED Final   Escherichia coli NOT DETECTED NOT DETECTED Final   Klebsiella oxytoca NOT DETECTED NOT DETECTED Final   Klebsiella pneumoniae NOT DETECTED NOT DETECTED Final   Proteus species NOT DETECTED NOT DETECTED Final   Serratia marcescens NOT DETECTED NOT DETECTED Final   Haemophilus influenzae NOT DETECTED NOT DETECTED Final   Neisseria meningitidis NOT DETECTED NOT DETECTED Final   Pseudomonas aeruginosa NOT DETECTED NOT DETECTED Final   Candida albicans NOT DETECTED NOT DETECTED Final   Candida glabrata NOT DETECTED NOT DETECTED Final   Candida krusei NOT DETECTED NOT DETECTED Final   Candida parapsilosis NOT DETECTED NOT DETECTED Final   Candida tropicalis NOT DETECTED NOT DETECTED Final    Comment: Performed at [facility], [address], [place], [place] [address]  Blood  culture (routine x 2)     Status: Abnormal   Collection Time: 11/12/19 11:40 PM   Specimen: BLOOD  Result Value Ref Range Status   Specimen Description BLOOD RIGHT ANTECUBITAL  Final   Special Requests   Final    BOTTLES DRAWN AEROBIC AND ANAEROBIC  Blood Culture adequate volume   Culture  Setup Time   Final    GRAM POSITIVE COCCI IN PAIRS IN CHAINS IN BOTH AEROBIC AND ANAEROBIC BOTTLES CRITICAL VALUE NOTED.  VALUE IS CONSISTENT WITH PREVIOUSLY REPORTED AND CALLED VALUE.    Culture (A)  Final    STREPTOCOCCUS PNEUMONIAE SUSCEPTIBILITIES PERFORMED ON PREVIOUS CULTURE WITHIN THE LAST 5 DAYS.    Report Status 11/15/2019 FINAL  Final  SARS Coronavirus 2 by RT PCR (hospital order, performed in [facility] hospital lab) Nasopharyngeal Nasopharyngeal Swab     Status: None   Collection Time: 11/13/19  1:00 AM   Specimen: Nasopharyngeal Swab  Result Value Ref Range Status   SARS Coronavirus 2 NEGATIVE NEGATIVE Final    Comment: (NOTE) SARS-CoV-2 target nucleic acids are NOT DETECTED.  The SARS-CoV-2 RNA is generally detectable in upper and lower respiratory specimens during the acute phase of infection. The lowest concentration of SARS-CoV-2 viral copies this assay can detect is 250 copies / mL. A negative result does not preclude SARS-CoV-2 infection and should not be used as the sole basis for treatment or other patient management decisions.  A negative result may occur with improper specimen collection / handling, submission of specimen other than nasopharyngeal swab, presence of viral mutation(s) within the areas targeted by this assay, and inadequate number of viral copies (<250 copies / mL). A negative result must be combined with clinical observations, patient history, and epidemiological information.  Fact Sheet for Patients:   StrictlyIdeas.no  Fact Sheet for Healthcare Providers: BankingDealers.co.za  This test is not yet  approved or  cleared by the [place] FDA and has been authorized for detection and/or diagnosis of SARS-CoV-2 by FDA under an Emergency Use Authorization (EUA).  This EUA will remain in effect (meaning this test can be used) for the duration of the COVID-19 declaration under Section 564(b)(1) of the Act, 21 U.S.C. section 360bbb-3(b)(1), unless the authorization is terminated or revoked sooner.  Performed at [facility], [place] [address] [place], [place] [address]   Urine culture     Status: Abnormal   Collection Time: 11/13/19  4:56 AM   Specimen: Urine, Clean Catch  Result Value Ref Range Status   Specimen Description   Final    URINE, CLEAN CATCH Performed at [facility], [place] [address] [place], [place] [address]    Special Requests   Final    NONE Performed at [facility], [place] [address] [place], [place] [address]    Culture (A)  Final    <10,000 COLONIES/mL INSIGNIFICANT GROWTH Performed at [place] [address], [place], [place] [address]    Report Status 11/14/2019 FINAL  Final  Culture, sputum-assessment     Status: None   Collection Time: 11/13/19  7:51 AM   Specimen: Expectorated Sputum  Result Value Ref Range Status   Specimen Description EXPECTORATED SPUTUM  Final   Special Requests NONE  Final   Sputum evaluation   Final    Sputum specimen not acceptable for testing.  Please recollect.   lonily, k by jolande  Performed at [facility], [place] [address], [place], [place] [address]    Report Status 11/13/2019 FINAL  Final  Culture, blood (routine x 2)     Status: None   Collection Time: 11/14/19 11:50 AM   Specimen: BLOOD LEFT HAND  Result Value Ref Range Status   Specimen Description   Final    BLOOD  LEFT HAND Performed at [facility], [place] [address], [place], [place] [address]    Special Requests   Final    BOTTLES DRAWN AEROBIC AND ANAEROBIC  Blood Culture adequate volume Performed at [place] [address] [place], [place] [address]    Culture   Final    NO GROWTH 5 DAYS Performed at [facility], [place] [address] [place], [place] [address]    Report Status 11/19/2019 FINAL  Final  Culture, blood (routine x 2)     Status: Abnormal   Collection Time: 11/14/19 11:56 AM   Specimen: BLOOD  Result Value Ref Range Status   Specimen Description   Final    BLOOD BLOOD RIGHT FOREARM Performed at [place] [address] [place], [place] [address]    Special Requests   Final    BOTTLES DRAWN AEROBIC AND ANAEROBIC Blood Culture adequate volume Performed at [place] [address], [place], [place] [address]    Culture  Setup Time   Final    GRAM POSITIVE COCCI AEROBIC BOTTLE ONLY CRITICAL VALUE NOTED.  VALUE IS CONSISTENT WITH PREVIOUSLY REPORTED AND CALLED VALUE.    Culture (A)  Final    STAPHYLOCOCCUS SPECIES (COAGULASE NEGATIVE) THE SIGNIFICANCE OF ISOLATING THIS ORGANISM FROM A SINGLE SET OF BLOOD CULTURES WHEN MULTIPLE SETS ARE DRAWN IS UNCERTAIN. PLEASE NOTIFY THE MICROBIOLOGY DEPARTMENT WITHIN ONE WEEK IF SPECIATION AND SENSITIVITIES ARE REQUIRED. Performed at [facility], [place] [address], [place], [place] [address]    Report Status 11/18/2019 FINAL  Final  Culture, blood (routine x 2)     Status: None (Preliminary result)   Collection Time: 11/16/19  4:00 PM   Specimen: BLOOD  Result Value Ref Range Status   Specimen Description   Final    BLOOD LEFT HAND Performed at [facility] [address] [place], [place] [address]    Special Requests   Final    BOTTLES DRAWN AEROBIC ONLY Blood Culture adequate volume Performed at [place] [address], [place], [place] [address]    Culture   Final    NO GROWTH 4 DAYS Performed at [facility], [place] [address], [place], [place]  [address]    Report Status PENDING  Incomplete  Culture, blood (routine x 2)     Status: None (Preliminary result)   Collection Time: 11/16/19  4:00 PM   Specimen: BLOOD  Result Value Ref Range Status   Specimen Description   Final    BLOOD RIGHT HAND Performed at [place] [address] [place], [place] [address]    Special Requests   Final    BOTTLES DRAWN AEROBIC ONLY Blood Culture adequate volume Performed at [place] [address] [place], [place] [address]    Culture   Final    NO GROWTH 4 DAYS Performed at [facility], [place] [address], [place],  [address]    Report Status PENDING  Incomplete    Procedures/Studies: CT ANGIO CHEST PE W OR WO CONTRAST  Result Date: 11/13/2019 CLINICAL DATA:  Pulmonary embolism suspected, high probability. EXAM: CT ANGIOGRAPHY CHEST WITH CONTRAST TECHNIQUE: Multidetector CT imaging of the chest was performed using the standard protocol during bolus administration of intravenous contrast. Multiplanar CT image reconstructions and MIPs were obtained to evaluate the vascular anatomy. CONTRAST:  130mL OMNIPAQUE IOHEXOL 350 MG/ML SOLN COMPARISON:  Chest  x-ray from earlier today FINDINGS: Cardiovascular: Satisfactory opacification of the pulmonary arteries to the segmental level. No evidence of pulmonary embolism. Normal heart size. No pericardial effusion. Aortic and coronary atherosclerotic calcification. No acute aortic finding. Mediastinum/Nodes: Negative for adenopathy or mass. Lungs/Pleura: Dense consolidation in the left lower lobe, affecting most of the lower lobe. Mild left upper lobe opacity along the major fissure. 5 mm average diameter right middle lobe pulmonary nodule which is stable from a 2018 abdominal CT and considered benign. Upper Abdomen: Cholecystectomy. Musculoskeletal: Thoracic spine degeneration. No acute or aggressive finding. Review of the MIP images confirms the above  findings. IMPRESSION: 1. Pneumonia involving most of the left lower lobe. 2. Negative for pulmonary embolism. 3. Atherosclerosis including the coronary arteries. Aortic Atherosclerosis (ICD10-I70.0). Electronically Signed   By: Monte Fantasia M.D.   On: 11/13/2019 07:41   DG CHEST PORT 1 VIEW  Result Date: 11/19/2019 CLINICAL DATA:  Pneumonia, COPD EXAM: PORTABLE CHEST 1 VIEW COMPARISON:  11/13/2019 chest radiograph. FINDINGS: Surgical hardware from ACDF is partially visualized. Stable cardiomediastinal silhouette with top-normal heart size. No pneumothorax. No pleural effusion. Hazy opacities in the mid to lower left lung appear similar. IMPRESSION: Stable hazy opacities in the mid to lower left lung, compatible with pneumonia. Continued chest radiograph follow-up suggested. Electronically Signed   By: Ilona Sorrel M.D.   On: 11/19/2019 11:56   DG Chest Portable 1 View  Result Date: 11/13/2019 CLINICAL DATA:  Dyspnea and shortness of breath EXAM: PORTABLE CHEST 1 VIEW COMPARISON:  None. FINDINGS: The heart size and mediastinal contours are within normal limits. Mildly hazy airspace opacity seen within the left lower lung with diffusely increased interstitial markings. Aortic knob calcifications are seen. The visualized skeletal structures are unremarkable. IMPRESSION: Mildly increased airspace opacities within the left lung which could be due to asymmetric edema and/or infectious etiology. Electronically Signed   By: Prudencio Pair M.D.   On: 11/13/2019 00:38   ECHOCARDIOGRAM COMPLETE  Result Date: 11/17/2019    ECHOCARDIOGRAM REPORT   Patient Name:   Briana Barr Date of Exam: 11/17/2019 Medical Rec #:  [id number]    Height:       66.0 in Accession #:    [id number]   Weight:       135.0 lb Date of Birth:  [birth year]    BSA:          1.692 m Patient Age:    79 years     BP:           112/66 mmHg Patient Gender: F            HR:           97 bpm. Exam Location:  Inpatient Procedure: 2D Echo Indications:     790.7 bacteremia  History:        Patient has prior history of Echocardiogram examinations, most                 recent 01/28/2016. COPD; Risk Factors:Dyslipidemia and Current                 Smoker.  Sonographer:    Jonelle Sidle Dance Referring Phys: [place]  Sonographer Comments: Suboptimal parasternal window. IMPRESSIONS  1. Hyperdynamic LV systolic function; intracavitary gradient of 3 m/s; grade 1 diastolic dysfunction; mild LVH.  2. Left ventricular ejection fraction, by estimation, is >75%. The left ventricle has hyperdynamic function. The left ventricle has no regional wall motion abnormalities. There is mild left ventricular hypertrophy.  Left ventricular diastolic parameters are consistent with Grade I diastolic dysfunction (impaired relaxation).  3. Right ventricular systolic function is normal. The right ventricular size is normal.  4. The mitral valve is normal in structure. No evidence of mitral valve regurgitation. No evidence of mitral stenosis.  5. The aortic valve has an indeterminant number of cusps. Aortic valve regurgitation is not visualized. No aortic stenosis is present.  6. The inferior vena cava is normal in size with greater than 50% respiratory variability, suggesting right atrial pressure of 3 mmHg. FINDINGS  Left Ventricle: Left ventricular ejection fraction, by estimation, is >75%. The left ventricle has hyperdynamic function. The left ventricle has no regional wall motion abnormalities. The left ventricular internal cavity size was normal in size. There is mild left ventricular hypertrophy. Left ventricular diastolic parameters are consistent with Grade I diastolic dysfunction (impaired relaxation). Right Ventricle: The right ventricular size is normal.Right ventricular systolic function is normal. Left Atrium: Left atrial size was normal in size. Right Atrium: Right atrial size was normal in size. Pericardium: There is no evidence of pericardial effusion. Mitral Valve: The  mitral valve is normal in structure. Normal mobility of the mitral valve leaflets. Moderate mitral annular calcification. No evidence of mitral valve regurgitation. No evidence of mitral valve stenosis. Tricuspid Valve: The tricuspid valve is normal in structure. Tricuspid valve regurgitation is trivial. No evidence of tricuspid stenosis. Aortic Valve: The aortic valve has an indeterminant number of cusps. Aortic valve regurgitation is not visualized. No aortic stenosis is present. Pulmonic Valve: The pulmonic valve was not well visualized. Pulmonic valve regurgitation is not visualized. No evidence of pulmonic stenosis. Aorta: The aortic root is normal in size and structure. Venous: The inferior vena cava is normal in size with greater than 50% respiratory variability, suggesting right atrial pressure of 3 mmHg.  Additional Comments: Hyperdynamic LV systolic function; intracavitary gradient of 3 m/s; grade 1 diastolic dysfunction; mild LVH.  LEFT VENTRICLE PLAX 2D LVIDd:         3.20 cm  Diastology LVIDs:         2.30 cm  LV e' lateral:   8.16 cm/s LV PW:         1.00 cm  LV E/e' lateral: 7.9 LV IVS:        1.30 cm  LV e' medial:    5.22 cm/s LVOT diam:     2.20 cm  LV E/e' medial:  12.3 LV SV:         70 LV SV Index:   41 LVOT Area:     3.80 cm  RIGHT VENTRICLE RV S prime:     13.40 cm/s LEFT ATRIUM             Index       RIGHT ATRIUM           Index LA diam:        2.90 cm 1.71 cm/m  RA Area:     14.80 cm LA Vol (A2C):   39.6 ml 23.40 ml/m RA Volume:   36.40 ml  21.51 ml/m LA Vol (A4C):   20.8 ml 12.29 ml/m LA Biplane Vol: 30.1 ml 17.79 ml/m  AORTIC VALVE LVOT Vmax:   120.00 cm/s LVOT Vmean:  80.300 cm/s LVOT VTI:    0.184 m  AORTA Ao Root diam: 2.70 cm MITRAL VALVE MV Area (PHT): 3.74 cm     SHUNTS MV Decel Time: 203 msec     Systemic VTI:  0.18 m  MV E velocity: 64.30 cm/s   Systemic Diam: 2.20 cm MV A velocity: 131.00 cm/s MV E/A ratio:  0.49 Kirk Ruths MD Electronically signed by Kirk Ruths MD  Signature Date/Time: 11/17/2019/11:54:44 AM    Final      Time coordinating discharge: Over 80 minutes    Dwyane Dee, MD  [facility] 11/20/2019, 4:24 PM Pager: Secure chat  If 7PM-7AM, please contact night-coverage [email or web address] Password [email or web address]

## 2019-11-20 NOTE — TOC Transition Note (Signed)
Transition of Care [facility]) - CM/SW Discharge Note   Patient Details  Name: Briana Barr MRN: [id number] Date of Birth: 1940-11-17  [facility] of [facility] CM/SW Contact:  Ross Ludwig, LCSW Phone Number: 11/20/2019, 11:25 AM   Clinical Narrative:     CSW spoke to patient's son Legrand [place]o, an daughter in law Buford, to provide bed offers.  Patient's family would rather her go home with home health.  CSW was informed by patient's daughter in law, that patient was just started with [facility] and they would continue with them.  CSW spoke to [facility] at [facility], and they can accept patient, patient's family is also requesting a front wheel walker, and tub bench.  Patient will be going home with home health through [facility].  CSW signing off please reconsult with any other social work needs, home health agency has been notified of planned discharge.   Final next level of care: [place] Barriers to Discharge: Barriers Resolved   Patient Goals and CMS Choice Patient states their goals for this hospitalization and ongoing recovery are:: To return back home with home health services. CMS Medicare.gov Compare Post Acute Care list provided to:: Patient Choice offered to / list presented to : Patient, Adult Children  Discharge Placement  Patient to discharge back home with home health PT, OT, RN, Aide, and social work.                     Discharge Plan and Services In-house Referral: NA Discharge Planning Services: NA Post Acute Care Choice: [place]          DME Arranged: Walker rolling, Tub bench DME Agency: [employer] Date DME Agency Contacted: 11/20/19 Time DME Agency Contacted: [phone] Representative spoke with at DME Agency: Thedore Mins HH Arranged: PT, OT, Nurse's Aide, Social Work, Engineer, materials Agency: [facility] [facility] [employer] Date [place]: 11/20/19 Time [place]: [id number] Representative spoke with at [place]:  [place] ([place]) Interventions     Readmission Risk Interventions No flowsheet data found.

## 2019-11-20 NOTE — Plan of Care (Signed)

## 2019-11-20 NOTE — Care Management Important Message (Signed)
Important Message  Patient Details IM Letter given to the Patient Name: Briana Barr MRN: [id number] Date of Birth: 1941-04-03   Medicare Important Message Given:  Yes     Kerin Salen 11/20/2019, 10:43 AM

## 2019-11-20 NOTE — Progress Notes (Signed)
Physical Therapy Treatment Patient Details Name: Briana Barr MRN: [id number] DOB: Jan 15, 1941 Today's Date: 11/20/2019    History of Present Illness 79 y.o. female with history of chronic respiratory failure secondary to COPD on 4 L oxygen, hypertension, diabetes mellitus type 2 peripheral vascular disease, previous history of DVT presents to the ER because of worsening shortness of breath over the last week.  Patient states initially she was having productive cough and has been feeling increasingly weak.  Denies chest pain nausea vomiting or abdominal pain.  Since patient was progressively getting weak patient was brought to the ER.    PT Comments    Assisted with amb in room only.  General Gait Details: pt was only able to tolerated amb in room to and from bathroom due to fatigue and 3/4 dyspnea remaining on 4 lts.  Assisted back to bed per pt request to rest.   Follow Up Recommendations  Home health PT;Supervision/Assistance - 24 hour     Equipment Recommendations  3in1 (PT)    Recommendations for Other Services       Precautions / Restrictions Precautions Precaution Comments: HOME O2 Restrictions Weight Bearing Restrictions: No    Mobility  Bed Mobility Overal bed mobility: Needs Assistance Bed Mobility: Sit to Supine       Sit to supine: Supervision;Min guard   General bed mobility comments: increased time but self able  Transfers Overall transfer level: Needs assistance Equipment used: Rolling walker (2 wheeled) Transfers: Sit to/from [facility] Sit to Stand: Supervision         General transfer comment: asissted to bathroom and back to bed  Ambulation/Gait Ambulation/Gait assistance: Supervision   Assistive device: Rolling walker (2 wheeled) Gait Pattern/deviations: Step-through pattern;Decreased stride length Gait velocity: decreased   General Gait Details: pt was only able to tolerated amb in room to and from bathroom due to fatigue  and 3/4 dyspnea remaining on 4 lts   Stairs             Wheelchair Mobility    Modified Rankin (Stroke Patients Only)       Balance                                            Cognition Arousal/Alertness: Awake/alert Behavior During Therapy: WFL for tasks assessed/performed Overall Cognitive Status: Within Functional Limits for tasks assessed                                 General Comments: AxO x 3 very pleasant      Exercises      General Comments        Pertinent Vitals/Pain      Home Living                      Prior Function            PT Goals (current goals can now be found in the care plan section) Progress towards PT goals: Progressing toward goals    Frequency    Min 3X/week      PT Plan Current plan remains appropriate    Co-evaluation              AM-PAC PT "6 Clicks" Mobility   Outcome Measure  Help needed turning from your back to  your side while in a flat bed without using bedrails?: A Little Help needed moving from lying on your back to sitting on the side of a flat bed without using bedrails?: A Little Help needed moving to and from a bed to a chair (including a wheelchair)?: A Little Help needed standing up from a chair using your arms (e.g., wheelchair or bedside chair)?: A Little Help needed to walk in hospital room?: A Little Help needed climbing 3-5 steps with a railing? : A Little 6 Click Score: 18    End of Session Equipment Utilized During Treatment: Oxygen;Gait belt Activity Tolerance: Patient limited by fatigue Patient left: in bed;with call bell/phone within reach;with bed alarm set Nurse Communication: Mobility status PT Visit Diagnosis: Unsteadiness on feet (R26.81);Other abnormalities of gait and mobility (R26.89);Muscle weakness (generalized) (M62.81)     Time: 9987-2158 PT Time Calculation (min) (ACUTE ONLY): 25 min  Charges:  $Gait Training: 8-22  mins $Therapeutic Activity: 8-22 mins                     {Tiffancy Moger  PTA Acute  Rehabilitation Services Pager      [phone] Office      [phone]

## 2019-11-21 DIAGNOSIS — K2270 Barrett's esophagus without dysplasia: Secondary | ICD-10-CM | POA: Diagnosis not present

## 2019-11-21 DIAGNOSIS — Z9981 Dependence on supplemental oxygen: Secondary | ICD-10-CM | POA: Diagnosis not present

## 2019-11-21 DIAGNOSIS — E113599 Type 2 diabetes mellitus with proliferative diabetic retinopathy without macular edema, unspecified eye: Secondary | ICD-10-CM | POA: Diagnosis not present

## 2019-11-21 DIAGNOSIS — I82402 Acute embolism and thrombosis of unspecified deep veins of left lower extremity: Secondary | ICD-10-CM | POA: Diagnosis not present

## 2019-11-21 DIAGNOSIS — E785 Hyperlipidemia, unspecified: Secondary | ICD-10-CM | POA: Diagnosis not present

## 2019-11-21 DIAGNOSIS — Z95820 Peripheral vascular angioplasty status with implants and grafts: Secondary | ICD-10-CM | POA: Diagnosis not present

## 2019-11-21 DIAGNOSIS — K8020 Calculus of gallbladder without cholecystitis without obstruction: Secondary | ICD-10-CM | POA: Diagnosis not present

## 2019-11-21 DIAGNOSIS — K559 Vascular disorder of intestine, unspecified: Secondary | ICD-10-CM | POA: Diagnosis not present

## 2019-11-21 DIAGNOSIS — F17210 Nicotine dependence, cigarettes, uncomplicated: Secondary | ICD-10-CM | POA: Diagnosis not present

## 2019-11-21 DIAGNOSIS — F0150 Vascular dementia without behavioral disturbance: Secondary | ICD-10-CM | POA: Diagnosis not present

## 2019-11-21 DIAGNOSIS — J441 Chronic obstructive pulmonary disease with (acute) exacerbation: Secondary | ICD-10-CM | POA: Diagnosis not present

## 2019-11-21 DIAGNOSIS — K635 Polyp of colon: Secondary | ICD-10-CM | POA: Diagnosis not present

## 2019-11-21 DIAGNOSIS — Z7984 Long term (current) use of oral hypoglycemic drugs: Secondary | ICD-10-CM | POA: Diagnosis not present

## 2019-11-21 DIAGNOSIS — J9611 Chronic respiratory failure with hypoxia: Secondary | ICD-10-CM | POA: Diagnosis not present

## 2019-11-21 DIAGNOSIS — N281 Cyst of kidney, acquired: Secondary | ICD-10-CM | POA: Diagnosis not present

## 2019-11-21 DIAGNOSIS — M810 Age-related osteoporosis without current pathological fracture: Secondary | ICD-10-CM | POA: Diagnosis not present

## 2019-11-21 DIAGNOSIS — H409 Unspecified glaucoma: Secondary | ICD-10-CM | POA: Diagnosis not present

## 2019-11-21 LAB — CULTURE, BLOOD (ROUTINE X 2)
Culture: NO GROWTH
Culture: NO GROWTH
Special Requests: ADEQUATE
Special Requests: ADEQUATE

## 2019-11-22 ENCOUNTER — Encounter: Payer: Self-pay | Admitting: Family Medicine

## 2019-11-24 ENCOUNTER — Other Ambulatory Visit: Payer: Self-pay

## 2019-11-24 ENCOUNTER — Telehealth: Payer: Self-pay

## 2019-11-24 ENCOUNTER — Ambulatory Visit (INDEPENDENT_AMBULATORY_CARE_PROVIDER_SITE_OTHER): Payer: Medicare Other | Admitting: Family Medicine

## 2019-11-24 VITALS — BP 110/60 | HR 98 | Temp 96.5°F | Ht 64.0 in | Wt 142.0 lb

## 2019-11-24 DIAGNOSIS — J431 Panlobular emphysema: Secondary | ICD-10-CM | POA: Diagnosis not present

## 2019-11-24 DIAGNOSIS — J9611 Chronic respiratory failure with hypoxia: Secondary | ICD-10-CM | POA: Diagnosis not present

## 2019-11-24 DIAGNOSIS — R7881 Bacteremia: Secondary | ICD-10-CM

## 2019-11-24 DIAGNOSIS — J13 Pneumonia due to Streptococcus pneumoniae: Secondary | ICD-10-CM

## 2019-11-24 MED ORDER — LEVOFLOXACIN 500 MG PO TABS
500.0000 mg | ORAL_TABLET | Freq: Every day | ORAL | 0 refills | Status: DC
Start: 2019-11-24 — End: 2020-03-16

## 2019-11-24 NOTE — Progress Notes (Signed)
Subjective:    Patient ID: Briana Barr, female    DOB: [birth year], 79 y.o.   MRN: [id number]  HPI Patient was recently discharged from the hospital.  I have copied relevant portions of her discharge summary below for my reference: Admit date: 11/12/2019 Discharge date: 11/20/2019  Admitted From: Home Disposition: Home with home health Discharging physician: Dwyane Dee, MD  Recommendations for Outpatient Follow-up:  1. Recommending ongoing smoking cessation but patient had no desire to quit during hospitalization 2. Compliance with oxygen and weaning as able  Patient discharged to home with home health in Discharge Condition: stable CODE STATUS: Full Diet recommendation:     Diet Orders (From admission, onward)           Start     Ordered    11/20/19 0000  Diet Carb Modified        11/20/19 1129    11/13/19 0328  Diet heart healthy/carb modified Room service appropriate? Yes; Fluid consistency: Thin  Diet effective now       Question Answer Comment  Diet-HS Snack? Nothing   Room service appropriate? Yes   Fluid consistency: Thin      11/13/19 0328          Hospital Course: Braeleigh Mahoneyis a 79 y.o.femalewithhistory of chronic respiratory failure secondary to COPD on 4 L oxygen, hypertension, diabetes mellitus type 2, peripheral vascular disease, previous history of DVT who presented to the ER because of worsening shortness of breath over the last week. Patient states initially she was having productive cough and has been feeling increasingly weak. Denies chest pain nausea vomiting or abdominal pain. Since patient was progressively getting weak patient was brought to the ER.  In the ER patient was short of breath with chest x-ray showing infiltrates concerning for pneumonia versus edema. Lab work was significant for WBC count of 21,000 lactic acid of 2.2 blood glucose 244 potassium of 3.3 EKG shows tachycardia with nonspecific ST-T changes in the inferior  leads. Patient was started on empiric antibiotics for pneumonia also was started on nebulizer and steroids for possible COPD exacerbation. Covid test is negative.  Her strep pna urinary Ag was positive and her admission blood cultures also grew 4/4 bottles with Strep Pna. She was placed on CTX on admission for PNA coverage initially as well.  Repeat blood cultures were drawn on 11/14/2019.  Cultures from 8/3 grew 1/4 CoNS considered contaminate. She underwent TTE on 8/6 which was negative for vegetation. Noted mild LVH, EF > 75%, Gr 1 DD.   She was also evaluated by physical therapy during hospitalization due to her weakness and deconditioning.  She also was amenable for placement in rehab at time of discharge when available.  After completing her course of antibiotics, patient and family had changed their mind and she desired to go home with home health instead.  Home health was then arranged at discharge.   Acute respiratory failure with hypoxia (HCC) - patient on chronic O2 at home, 4L - continue O2  COPD (chronic obstructive pulmonary disease) (HCC) - no s/s exacerbation - continue to monitor   Diabetes mellitus type 2 in nonobese ([place]) - continue SSI and CBG monitoring   Essential hypertension - continue current regimen   CAP (community acquired pneumonia) - noted on CXR; also positive strep pna urinary Ag; has also translocated to blood; see bacteremia -Completed antibiotic course in hospital -CXR repeated on 11/19/2019, shows improvement in left-sided pneumonia  Bacteremia - admission blood cultures growing 4/4  Strep pna - suspected seeding from lungs - repeat blood cultures on 8/3 considered negative (1/4 noted with CoNS consistent with contaminate) - repeat cultures again on 8/5; will go ahead and obtain TTE to rule out veg as well: no veg appreciated -7-day course of Rocephin completed during hospitalization  Patient is here today with her daughter-in-law for  follow-up.  She is 85% on 4 L.  However she is sitting in her wheelchair resting comfortably.  She denies any shortness of breath.  She denies any distress.  She has no increased respiratory rate.  She has no increased work of breathing.  On exam today, she has diminished breath sounds in all 4 lung fields.  She has very little air movement.  There are no appreciated expiratory wheezes.  She does have pronounced left basilar crackles in the location of her previous pneumonia.  Daughter denies any fever.  She denies any worsening confusion.  However breath sounds are markedly abnormal. Past Medical History:  Diagnosis Date  . Barrett esophagus   . Colon polyps    benign  . COPD (chronic obstructive pulmonary disease) ([place])   . Duodenal ulcer   . DVT (deep venous thrombosis) (HCC)    left leg x 2  . Gallstones   . GERD (gastroesophageal reflux disease)   . Glaucoma   . Hyperlipidemia   . Ischemic colitis ([place])   . Osteoporosis   . Pneumonia    hx of x 2 as a child   . Renal cyst, right   . Retinopathy, background, proliferative    Past Surgical History:  Procedure Laterality Date  . cervical vertebral fusion     c5-6, 6-7  . CHOLECYSTECTOMY    . COLONOSCOPY WITH PROPOFOL N/A 11/23/2016   Procedure: COLONOSCOPY WITH PROPOFOL;  Surgeon: Ladene Artist, MD;  Location: [facility];  Service: Endoscopy;  Laterality: N/A;  . ESOPHAGOGASTRODUODENOSCOPY (EGD) WITH PROPOFOL N/A 11/23/2016   Procedure: ESOPHAGOGASTRODUODENOSCOPY (EGD) WITH PROPOFOL;  Surgeon: Ladene Artist, MD;  Location: [facility];  Service: Endoscopy;  Laterality: N/A;  . FEMORAL-POPLITEAL BYPASS GRAFT    . IR RADIOLOGIST EVAL & MGMT  06/06/2019   Current Outpatient Medications on File Prior to Visit  Medication Sig Dispense Refill  . acetaZOLAMIDE (DIAMOX) 250 MG tablet TAKE 1 TABLET BY MOUTH EVERY DAY (Patient taking differently: Take 250 mg by mouth daily. ) 90 tablet 1  . albuterol (PROAIR HFA) 108 (90 Base)  MCG/ACT inhaler Inhale 2 puffs into the lungs every 6 (six) hours as needed for wheezing or shortness of breath. 18 g 1  . aspirin 81 MG tablet Take 81 mg by mouth daily.    . Cholecalciferol (VITAMIN D3) 250 MCG (10000 UT) capsule Take 1 capsule (10,000 Units total) by mouth daily. (Patient not taking: Reported on 11/13/2019) 30 capsule   . escitalopram (LEXAPRO) 10 MG tablet TAKE 1 TABLET BY MOUTH EVERY DAY (Patient not taking: Reported on 11/13/2019) 90 tablet 3  . Fluticasone-Umeclidin-Vilant (TRELEGY ELLIPTA) 100-62.5-25 MCG/INH AEPB Inhale 1 Inhaler into the lungs daily. 1 each 11  . glimepiride (AMARYL) 1 MG tablet TAKE 1 TABLET BY MOUTH EVERY DAY IN THE MORNING (Patient not taking: Reported on 11/13/2019) 90 tablet 1  . HYDROcodone-acetaminophen (NORCO) 5-325 MG tablet Take 1 tablet by mouth 4 (four) times daily. (Patient not taking: Reported on 11/13/2019) 60 tablet 0  . ipratropium-albuterol (DUONEB) 0.5-2.5 (3) MG/3ML SOLN Take 3 mLs by nebulization every 6 (six) hours as needed. (Patient taking differently: Take  3 mLs by nebulization every 6 (six) hours as needed (SOB, wheezing). ) 360 mL 1  . losartan (COZAAR) 50 MG tablet Take 1 tablet (50 mg total) by mouth daily. 90 tablet 3  . metFORMIN (GLUCOPHAGE) 500 MG tablet TAKE 1 TABLET BY MOUTH TWICE A DAY WITH MEALS 180 tablet 1  . Multiple Vitamins-Minerals (CENTRUM SILVER 50+WOMEN) TABS Take 1 tablet by mouth daily. 90 tablet   . omeprazole (PRILOSEC) 20 MG capsule TAKE 1 CAPSULE BY MOUTH TWICE A DAY BEFORE A MEAL (Patient not taking: Reported on 11/13/2019) 180 capsule 3  . rosuvastatin (CRESTOR) 20 MG tablet TAKE 1 TABLET BY MOUTH EVERYDAY AT BEDTIME (Patient taking differently: Take 20 mg by mouth daily after breakfast. ) 90 tablet 1   No current facility-administered medications on file prior to visit.   Allergies  Allergen Reactions  . Vioxx [Rofecoxib] Swelling   Social History   Socioeconomic History  . Marital status: Widowed     Spouse name: Not on file  . Number of children: 3  . Years of education: Not on file  . Highest education level: Not on file  Occupational History  . Occupation: retired  Tobacco Use  . Smoking status: Current Every Day Smoker    Packs/day: 1.00    Types: Cigarettes  . Smokeless tobacco: Never Used  . Tobacco comment: started age 86  Vaping Use  . Vaping Use: Never used  Substance and Sexual Activity  . Alcohol use: No  . Drug use: No  . Sexual activity: Not on file  Other Topics Concern  . Not on file  Social History Narrative  . Not on file   Social Determinants of Health   Financial Resource Strain:   . Difficulty of Paying Living Expenses:   Food Insecurity:   . Worried About Charity fundraiser in the Last Year:   . Arboriculturist in the Last Year:   Transportation Needs:   . Film/video editor (Medical):   Marland Kitchen Lack of Transportation (Non-Medical):   Physical Activity:   . Days of Exercise per Week:   . Minutes of Exercise per Session:   Stress:   . Feeling of Stress :   Social Connections:   . Frequency of Communication with Friends and Family:   . Frequency of Social Gatherings with Friends and Family:   . Attends Religious Services:   . Active Member of Clubs or Organizations:   . Attends Archivist Meetings:   Marland Kitchen Marital Status:   Intimate Partner Violence:   . Fear of Current or Ex-Partner:   . Emotionally Abused:   Marland Kitchen Physically Abused:   . Sexually Abused:      Review of Systems  All other systems reviewed and are negative.      Objective:   Physical Exam Vitals reviewed.  Constitutional:      General: She is not in acute distress.    Appearance: She is not ill-appearing or toxic-appearing.  Cardiovascular:     Rate and Rhythm: Normal rate and regular rhythm.     Heart sounds: Normal heart sounds.  Pulmonary:     Effort: Pulmonary effort is normal. No respiratory distress.     Breath sounds: Decreased air movement present.  Examination of the right-upper field reveals decreased breath sounds. Examination of the left-upper field reveals decreased breath sounds. Examination of the right-middle field reveals decreased breath sounds. Examination of the left-middle field reveals decreased breath sounds. Examination of the  right-lower field reveals decreased breath sounds. Examination of the left-lower field reveals decreased breath sounds and rales. Decreased breath sounds and rales present. No wheezing or rhonchi.    Abdominal:     General: Abdomen is flat. Bowel sounds are normal.     Palpations: Abdomen is soft.  Musculoskeletal:     Right lower leg: No edema.     Left lower leg: No edema.  Neurological:     Mental Status: She is alert.           Assessment & Plan:  Chronic respiratory failure with hypoxia (HCC)  Bacteremia  Panlobular emphysema (HCC)  Pneumonia of left lower lobe due to Streptococcus pneumoniae [facility])  Patient is currently on hospice.  Therefore we discussed about goals of care.  I will extend the patient's antibiotics for an additional week given the pronounced crackles in the left lower lobe and her worsening hypoxia.  Begin Levaquin 500 mg daily for 7 days.  They can certainly use albuterol/duo nebs every 6 hours in addition to her Trelegy.  Increase oxygen to 5 L via nasal cannula.  Goal oxygen level should be 88 to 90%.  Recheck in 1 week.  Repeat blood cultures at that point along with CBC and CMP.  Family questions if we should repeat the CAT scan to reassess for resolution of the pneumonia.  I have recommended that we based on clinical appearance given her poor prognosis.  I explained that the CAT scan will be done primarily to evaluate for the presence of cancer if her clinical appearance has improved.  Therefore I question the utility in repeating the CAT scan.  We will discuss further at her follow-up.

## 2019-11-24 NOTE — Telephone Encounter (Signed)
[facility] called to state that the order for portable O2 and to increase pt's liters has to have a walk test or testing as to why pt needs this. They cannot go forward with this order at this time without testing. Please advise.

## 2019-11-25 DIAGNOSIS — E785 Hyperlipidemia, unspecified: Secondary | ICD-10-CM | POA: Diagnosis not present

## 2019-11-25 DIAGNOSIS — M810 Age-related osteoporosis without current pathological fracture: Secondary | ICD-10-CM | POA: Diagnosis not present

## 2019-11-25 DIAGNOSIS — F0150 Vascular dementia without behavioral disturbance: Secondary | ICD-10-CM | POA: Diagnosis not present

## 2019-11-25 DIAGNOSIS — J9611 Chronic respiratory failure with hypoxia: Secondary | ICD-10-CM | POA: Diagnosis not present

## 2019-11-25 DIAGNOSIS — J441 Chronic obstructive pulmonary disease with (acute) exacerbation: Secondary | ICD-10-CM | POA: Diagnosis not present

## 2019-11-25 DIAGNOSIS — I82402 Acute embolism and thrombosis of unspecified deep veins of left lower extremity: Secondary | ICD-10-CM | POA: Diagnosis not present

## 2019-11-27 ENCOUNTER — Other Ambulatory Visit: Payer: Self-pay | Admitting: Family Medicine

## 2019-11-27 NOTE — Telephone Encounter (Signed)
Notes were faxed to [facility]

## 2019-11-27 NOTE — Telephone Encounter (Signed)
Please send them my last office vistsit she was 85% on 4 L

## 2019-11-28 ENCOUNTER — Telehealth: Payer: Self-pay

## 2019-11-28 NOTE — Telephone Encounter (Signed)
Tonya Nurse from [place] called about Briana Barr, [place] Orders she did decline Social worker order, she does want Home Health/ Nursing Once week for 3 week Every other week  1 PRN - for COPD 1 week for 4 week She is on 6 liters of oxygen at home

## 2019-11-28 NOTE — Telephone Encounter (Signed)
What do we need to do?

## 2019-11-29 ENCOUNTER — Telehealth: Payer: Self-pay

## 2019-11-29 NOTE — Telephone Encounter (Signed)
Briana Barr from [place] called, Home Health orders for Social Worker which Briana Barr denied, She did agree to Home Health/Nursing. Once week for 3 week, every other week  1 PRN- COPD and 1 week for 4 weeks She is on 6 liters of oxygen at home.

## 2019-11-29 NOTE — Telephone Encounter (Signed)
As of right now nothing, gave verbal orders for Home Health.

## 2019-11-30 NOTE — Telephone Encounter (Signed)
If they are requesting verbal orders I am fine agreeing to that plan

## 2019-11-30 NOTE — Telephone Encounter (Signed)
Verbal orders given to [facility]

## 2019-12-01 ENCOUNTER — Ambulatory Visit (INDEPENDENT_AMBULATORY_CARE_PROVIDER_SITE_OTHER): Payer: Medicare Other | Admitting: Podiatry

## 2019-12-01 ENCOUNTER — Encounter: Payer: Self-pay | Admitting: Family Medicine

## 2019-12-01 ENCOUNTER — Telehealth: Payer: Self-pay | Admitting: *Deleted

## 2019-12-01 ENCOUNTER — Ambulatory Visit (INDEPENDENT_AMBULATORY_CARE_PROVIDER_SITE_OTHER): Payer: Medicare Other | Admitting: Family Medicine

## 2019-12-01 ENCOUNTER — Other Ambulatory Visit: Payer: Self-pay

## 2019-12-01 VITALS — BP 144/68 | HR 66 | Temp 98.8°F | Resp 16 | Ht 64.0 in | Wt 143.0 lb

## 2019-12-01 DIAGNOSIS — M79674 Pain in right toe(s): Secondary | ICD-10-CM

## 2019-12-01 DIAGNOSIS — B351 Tinea unguium: Secondary | ICD-10-CM | POA: Diagnosis not present

## 2019-12-01 DIAGNOSIS — E1140 Type 2 diabetes mellitus with diabetic neuropathy, unspecified: Secondary | ICD-10-CM

## 2019-12-01 DIAGNOSIS — M79675 Pain in left toe(s): Secondary | ICD-10-CM

## 2019-12-01 DIAGNOSIS — J431 Panlobular emphysema: Secondary | ICD-10-CM

## 2019-12-01 DIAGNOSIS — Z794 Long term (current) use of insulin: Secondary | ICD-10-CM

## 2019-12-01 DIAGNOSIS — M810 Age-related osteoporosis without current pathological fracture: Secondary | ICD-10-CM | POA: Diagnosis not present

## 2019-12-01 DIAGNOSIS — L853 Xerosis cutis: Secondary | ICD-10-CM | POA: Diagnosis not present

## 2019-12-01 DIAGNOSIS — J9611 Chronic respiratory failure with hypoxia: Secondary | ICD-10-CM | POA: Diagnosis not present

## 2019-12-01 DIAGNOSIS — I82402 Acute embolism and thrombosis of unspecified deep veins of left lower extremity: Secondary | ICD-10-CM | POA: Diagnosis not present

## 2019-12-01 DIAGNOSIS — E7800 Pure hypercholesterolemia, unspecified: Secondary | ICD-10-CM | POA: Diagnosis not present

## 2019-12-01 DIAGNOSIS — F0150 Vascular dementia without behavioral disturbance: Secondary | ICD-10-CM | POA: Diagnosis not present

## 2019-12-01 DIAGNOSIS — J441 Chronic obstructive pulmonary disease with (acute) exacerbation: Secondary | ICD-10-CM | POA: Diagnosis not present

## 2019-12-01 DIAGNOSIS — E118 Type 2 diabetes mellitus with unspecified complications: Secondary | ICD-10-CM

## 2019-12-01 DIAGNOSIS — E785 Hyperlipidemia, unspecified: Secondary | ICD-10-CM | POA: Diagnosis not present

## 2019-12-01 MED ORDER — HYDROCODONE-ACETAMINOPHEN 5-325 MG PO TABS: 1.0000 | ORAL_TABLET | Freq: Four times a day (QID) | ORAL | 0 refills | Status: DC

## 2019-12-01 NOTE — Progress Notes (Addendum)
Subjective:    Patient ID: Briana Barr, female    DOB: 05-02-1940, 79 y.o.   MRN: [id number]  HPI Patient was recently discharged from the hospital.  I have copied relevant portions of her discharge summary below for my reference: Admit date: 11/12/2019 Discharge date: 11/20/2019  Admitted From: Home Disposition: Home with home health Discharging physician: Dwyane Dee, MD  Recommendations for Outpatient Follow-up:  1. Recommending ongoing smoking cessation but patient had no desire to quit during hospitalization 2. Compliance with oxygen and weaning as able  Patient discharged to home with home health in Discharge Condition: stable CODE STATUS: Full Diet recommendation:     Diet Orders (From admission, onward)           Start     Ordered    11/20/19 0000  Diet Carb Modified        11/20/19 1129    11/13/19 0328  Diet heart healthy/carb modified Room service appropriate? Yes; Fluid consistency: Thin  Diet effective now       Question Answer Comment  Diet-HS Snack? Nothing   Room service appropriate? Yes   Fluid consistency: Thin      11/13/19 0328          Hospital Course: Briana Mahoneyis a 79 y.o.femalewithhistory of chronic respiratory failure secondary to COPD on 4 L oxygen, hypertension, diabetes mellitus type 2, peripheral vascular disease, previous history of DVT who presented to the ER because of worsening shortness of breath over the last week. Patient states initially she was having productive cough and has been feeling increasingly weak. Denies chest pain nausea vomiting or abdominal pain. Since patient was progressively getting weak patient was brought to the ER.  In the ER patient was short of breath with chest x-ray showing infiltrates concerning for pneumonia versus edema. Lab work was significant for WBC count of 21,000 lactic acid of 2.2 blood glucose 244 potassium of 3.3 EKG shows tachycardia with nonspecific ST-T changes in the inferior  leads. Patient was started on empiric antibiotics for pneumonia also was started on nebulizer and steroids for possible COPD exacerbation. Covid test is negative.  Her strep pna urinary Ag was positive and her admission blood cultures also grew 4/4 bottles with Strep Pna. She was placed on CTX on admission for PNA coverage initially as well.  Repeat blood cultures were drawn on 11/14/2019.  Cultures from 8/3 grew 1/4 CoNS considered contaminate. She underwent TTE on 8/6 which was negative for vegetation. Noted mild LVH, EF > 75%, Gr 1 DD.   She was also evaluated by physical therapy during hospitalization due to her weakness and deconditioning.  She also was amenable for placement in rehab at time of discharge when available.  After completing her course of antibiotics, patient and family had changed their mind and she desired to go home with home health instead.  Home health was then arranged at discharge.   Acute respiratory failure with hypoxia (HCC) - patient on chronic O2 at home, 4L - continue O2  COPD (chronic obstructive pulmonary disease) (HCC) - no s/s exacerbation - continue to monitor   Diabetes mellitus type 2 in nonobese ([place]) - continue SSI and CBG monitoring   Essential hypertension - continue current regimen   CAP (community acquired pneumonia) - noted on CXR; also positive strep pna urinary Ag; has also translocated to blood; see bacteremia -Completed antibiotic course in hospital -CXR repeated on 11/19/2019, shows improvement in left-sided pneumonia  Bacteremia - admission blood cultures growing 4/4  Strep pna - suspected seeding from lungs - repeat blood cultures on 8/3 considered negative (1/4 noted with CoNS consistent with contaminate) - repeat cultures again on 8/5; will go ahead and obtain TTE to rule out veg as well: no veg appreciated -7-day course of Rocephin completed during hospitalization  Patient is here today with her daughter-in-law for  follow-up.  She is 85% on 4 L.  However she is sitting in her wheelchair resting comfortably.  She denies any shortness of breath.  She denies any distress.  She has no increased respiratory rate.  She has no increased work of breathing.  On exam today, she has diminished breath sounds in all 4 lung fields.  She has very little air movement.  There are no appreciated expiratory wheezes.  She does have pronounced left basilar crackles in the location of her previous pneumonia.  Daughter denies any fever.  She denies any worsening confusion.  However breath sounds are markedly abnormal.At that time, my plan was: Patient is currently on hospice.  Therefore we discussed about goals of care.  I will extend the patient's antibiotics for an additional week given the pronounced crackles in the left lower lobe and her worsening hypoxia.  Begin Levaquin 500 mg daily for 7 days.  They can certainly use albuterol/duo nebs every 6 hours in addition to her Trelegy.  Increase oxygen to 5 L via nasal cannula.  Goal oxygen level should be 88 to 90%.  Recheck in 1 week.  Repeat blood cultures at that point along with CBC and CMP.  Family questions if we should repeat the CAT scan to reassess for resolution of the pneumonia.  I have recommended that we based on clinical appearance given her poor prognosis.  I explained that the CAT scan will be done primarily to evaluate for the presence of cancer if her clinical appearance has improved.  Therefore I question the utility in repeating the CAT scan.  We will discuss further at her follow-up.  12/01/19 Patient states that her breathing has improved.  Her oxygen saturation is up to 93% on 5 L.  She is still wheezing today on exam.  She has faint left basilar crackles on the left side but this is much improved from her last visit.  She has completed Levaquin today.  Family is requesting a hospice consultation.  Patient has severe end-stage oxygen dependent COPD.  Her life expectancy is  6 months or less due to her severe COPD, her persistent smoking, and her recent pneumonia.  I do believe she would benefit from a hospice consultation as well as palliative care to assist in managing her end-of-life issues to improve her quality of life and to hopefully help prevent hospitalizations.  I discussed this at length with the patient and she is comfortable with that consultation.  She is still using Trelegy which does seem to help her breathing however she continues to smoke.  She is on Metformin for her diabetes along with glimepiride.  Her daughter-in-law states that she sleeps the majority of the day except for 7 or 8 hours in the middle of the day.  I am concerned about possible hypoglycemia due to increased lethargy.  She is overdue to check an A1c  Past Medical History:  Diagnosis Date  . Barrett esophagus   . Colon polyps    benign  . COPD (chronic obstructive pulmonary disease) ([place])   . Duodenal ulcer   . DVT (deep venous thrombosis) (HCC)    left  leg x 2  . Gallstones   . GERD (gastroesophageal reflux disease)   . Glaucoma   . Hyperlipidemia   . Ischemic colitis ([place])   . Osteoporosis   . Pneumonia    hx of x 2 as a child   . Renal cyst, right   . Retinopathy, background, proliferative    Past Surgical History:  Procedure Laterality Date  . cervical vertebral fusion     c5-6, 6-7  . CHOLECYSTECTOMY    . COLONOSCOPY WITH PROPOFOL N/A 11/23/2016   Procedure: COLONOSCOPY WITH PROPOFOL;  Surgeon: Ladene Artist, MD;  Location: [facility];  Service: Endoscopy;  Laterality: N/A;  . ESOPHAGOGASTRODUODENOSCOPY (EGD) WITH PROPOFOL N/A 11/23/2016   Procedure: ESOPHAGOGASTRODUODENOSCOPY (EGD) WITH PROPOFOL;  Surgeon: Ladene Artist, MD;  Location: [facility];  Service: Endoscopy;  Laterality: N/A;  . FEMORAL-POPLITEAL BYPASS GRAFT    . IR RADIOLOGIST EVAL & MGMT  06/06/2019   Current Outpatient Medications on File Prior to Visit  Medication Sig Dispense Refill  .  acetaZOLAMIDE (DIAMOX) 250 MG tablet TAKE 1 TABLET BY MOUTH EVERY DAY (Patient taking differently: Take 250 mg by mouth daily. ) 90 tablet 1  . albuterol (PROAIR HFA) 108 (90 Base) MCG/ACT inhaler Inhale 2 puffs into the lungs every 6 (six) hours as needed for wheezing or shortness of breath. 18 g 1  . aspirin 81 MG tablet Take 81 mg by mouth daily.    . Cholecalciferol (VITAMIN D3) 250 MCG (10000 UT) capsule Take 1 capsule (10,000 Units total) by mouth daily. 30 capsule   . Fluticasone-Umeclidin-Vilant (TRELEGY ELLIPTA) 100-62.5-25 MCG/INH AEPB Inhale 1 Inhaler into the lungs daily. 1 each 11  . glimepiride (AMARYL) 1 MG tablet TAKE 1 TABLET BY MOUTH EVERY DAY IN THE MORNING 90 tablet 1  . HYDROcodone-acetaminophen (NORCO) 5-325 MG tablet Take 1 tablet by mouth 4 (four) times daily. 60 tablet 0  . ipratropium-albuterol (DUONEB) 0.5-2.5 (3) MG/3ML SOLN Take 3 mLs by nebulization every 6 (six) hours as needed. (Patient taking differently: Take 3 mLs by nebulization every 6 (six) hours as needed (SOB, wheezing). ) 360 mL 1  . levofloxacin (LEVAQUIN) 500 MG tablet Take 1 tablet (500 mg total) by mouth daily. 7 tablet 0  . losartan (COZAAR) 50 MG tablet Take 1 tablet (50 mg total) by mouth daily. 90 tablet 3  . metFORMIN (GLUCOPHAGE) 500 MG tablet TAKE 1 TABLET BY MOUTH TWICE A DAY WITH MEALS 180 tablet 1  . Multiple Vitamins-Minerals (CENTRUM SILVER 50+WOMEN) TABS Take 1 tablet by mouth daily. 90 tablet   . omeprazole (PRILOSEC) 20 MG capsule TAKE 1 CAPSULE BY MOUTH TWICE A DAY BEFORE A MEAL 180 capsule 3  . rosuvastatin (CRESTOR) 20 MG tablet TAKE 1 TABLET BY MOUTH EVERYDAY AT BEDTIME (Patient taking differently: Take 20 mg by mouth daily after breakfast. ) 90 tablet 1   No current facility-administered medications on file prior to visit.   Allergies  Allergen Reactions  . Vioxx [Rofecoxib] Swelling   Social History   Socioeconomic History  . Marital status: Widowed    Spouse name: Not on file   . Number of children: 3  . Years of education: Not on file  . Highest education level: Not on file  Occupational History  . Occupation: retired  Tobacco Use  . Smoking status: Current Every Day Smoker    Packs/day: 1.00    Types: Cigarettes  . Smokeless tobacco: Never Used  . Tobacco comment: started age 75  Vaping  Use  . Vaping Use: Never used  Substance and Sexual Activity  . Alcohol use: No  . Drug use: No  . Sexual activity: Not on file  Other Topics Concern  . Not on file  Social History Narrative  . Not on file   Social Determinants of Health   Financial Resource Strain:   . Difficulty of Paying Living Expenses: Not on file  Food Insecurity:   . Worried About Charity fundraiser in the Last Year: Not on file  . Ran Out of Food in the Last Year: Not on file  Transportation Needs:   . Lack of Transportation (Medical): Not on file  . Lack of Transportation (Non-Medical): Not on file  Physical Activity:   . Days of Exercise per Week: Not on file  . Minutes of Exercise per Session: Not on file  Stress:   . Feeling of Stress : Not on file  Social Connections:   . Frequency of Communication with Friends and Family: Not on file  . Frequency of Social Gatherings with Friends and Family: Not on file  . Attends Religious Services: Not on file  . Active Member of Clubs or Organizations: Not on file  . Attends Archivist Meetings: Not on file  . Marital Status: Not on file  Intimate Partner Violence:   . Fear of Current or Ex-Partner: Not on file  . Emotionally Abused: Not on file  . Physically Abused: Not on file  . Sexually Abused: Not on file     Review of Systems  All other systems reviewed and are negative.      Objective:   Physical Exam Vitals reviewed.  Constitutional:      General: She is not in acute distress.    Appearance: She is not ill-appearing or toxic-appearing.  Cardiovascular:     Rate and Rhythm: Normal rate and regular rhythm.       Heart sounds: Normal heart sounds.  Pulmonary:     Effort: Pulmonary effort is normal. No respiratory distress.     Breath sounds: Decreased air movement present. Examination of the right-upper field reveals decreased breath sounds. Examination of the left-upper field reveals decreased breath sounds. Examination of the right-middle field reveals decreased breath sounds. Examination of the left-middle field reveals decreased breath sounds. Examination of the right-lower field reveals decreased breath sounds. Examination of the left-lower field reveals decreased breath sounds and rales. Decreased breath sounds and rales present. No wheezing or rhonchi.  Abdominal:     General: Abdomen is flat. Bowel sounds are normal.     Palpations: Abdomen is soft.  Musculoskeletal:     Right lower leg: No edema.     Left lower leg: No edema.  Neurological:     Mental Status: She is alert.           Assessment & Plan:  Chronic respiratory failure with hypoxia ([place]) - Plan: Ambulatory referral to Hospice  Panlobular emphysema ([place]) - Plan: Ambulatory referral to Hospice  Controlled type 2 diabetes mellitus with complication, without long-term current use of insulin ([place]) - Plan: CBC with Differential/Platelet, COMPLETE METABOLIC PANEL WITH GFR, Hemoglobin A1c  Pure hypercholesterolemia  I will consult hospice due to her emphysema and respiratory failure with hypoxia.  Continue Trelegy and use albuterol 2 puffs every 4-6 hours as needed.  Titrate oxygen between 5 and 6 L to achieve an oxygen saturation between 90 and 95%.  Check a CMP and an A1c.  If A1c is  less than 7.5, I will discontinue glipizide to avoid hypoglycemia.  Recheck in 3 months or as needed.  Patient has chronic respiratory failure secondary to COPD.  I am ordering non-- invasive ventilator to sustain life and decrease hospitalizations and exacerbations.

## 2019-12-01 NOTE — Telephone Encounter (Signed)
Received call from [facility], [facility] OT with [facility] [phone]~ telephone.   Requested VO for HH OT 1x weekly x1 week, 2x weekly x3 weeks, 1x weekly x2 weeks for ADL's, strengthening, balance and endurance.   VO given.

## 2019-12-02 DIAGNOSIS — F0150 Vascular dementia without behavioral disturbance: Secondary | ICD-10-CM | POA: Diagnosis not present

## 2019-12-02 DIAGNOSIS — J441 Chronic obstructive pulmonary disease with (acute) exacerbation: Secondary | ICD-10-CM | POA: Diagnosis not present

## 2019-12-02 DIAGNOSIS — J9611 Chronic respiratory failure with hypoxia: Secondary | ICD-10-CM | POA: Diagnosis not present

## 2019-12-02 DIAGNOSIS — I82402 Acute embolism and thrombosis of unspecified deep veins of left lower extremity: Secondary | ICD-10-CM | POA: Diagnosis not present

## 2019-12-02 DIAGNOSIS — M810 Age-related osteoporosis without current pathological fracture: Secondary | ICD-10-CM | POA: Diagnosis not present

## 2019-12-02 DIAGNOSIS — E785 Hyperlipidemia, unspecified: Secondary | ICD-10-CM | POA: Diagnosis not present

## 2019-12-02 LAB — COMPLETE METABOLIC PANEL WITH GFR
AG Ratio: 1.4 (calc) (ref 1.0–2.5)
ALT: 17 U/L (ref 6–29)
AST: 17 U/L (ref 10–35)
Albumin: 3.9 g/dL (ref 3.6–5.1)
Alkaline phosphatase (APISO): 58 U/L (ref 37–153)
BUN/Creatinine Ratio: 45 (calc) — ABNORMAL HIGH (ref 6–22)
BUN: 27 mg/dL — ABNORMAL HIGH (ref 7–25)
CO2: 31 mmol/L (ref 20–32)
Calcium: 9.6 mg/dL (ref 8.6–10.4)
Chloride: 102 mmol/L (ref 98–110)
Creat: 0.6 mg/dL (ref 0.60–0.93)
GFR, Est African American: 101 mL/min/{1.73_m2} (ref >=60)
GFR, Est Non African American: 87 mL/min/{1.73_m2} (ref >=60)
Globulin: 2.8 g/dL (calc) (ref 1.9–3.7)
Glucose, Bld: 170 mg/dL — ABNORMAL HIGH (ref 65–99)
Potassium: 4.4 mmol/L (ref 3.5–5.3)
Sodium: 141 mmol/L (ref 135–146)
Total Bilirubin: 0.4 mg/dL (ref 0.2–1.2)
Total Protein: 6.7 g/dL (ref 6.1–8.1)

## 2019-12-02 LAB — CBC WITH DIFFERENTIAL/PLATELET
Absolute Monocytes: 851 cells/uL (ref 200–950)
Basophils Absolute: 68 cells/uL (ref 0–200)
Basophils Relative: 0.9 %
Eosinophils Absolute: 198 cells/uL (ref 15–500)
Eosinophils Relative: 2.6 %
HCT: 40 % (ref 35.0–45.0)
Hemoglobin: 12.5 g/dL (ref 11.7–15.5)
Lymphs Abs: 958 cells/uL (ref 850–3900)
MCH: 30.1 pg (ref 27.0–33.0)
MCHC: 31.3 g/dL — ABNORMAL LOW (ref 32.0–36.0)
MCV: 96.4 fL (ref 80.0–100.0)
MPV: 10.9 fL (ref 7.5–12.5)
Monocytes Relative: 11.2 %
Neutro Abs: 5525 cells/uL (ref 1500–7800)
Neutrophils Relative %: 72.7 %
Platelets: 433 10*3/uL — ABNORMAL HIGH (ref 140–400)
RBC: 4.15 10*6/uL (ref 3.80–5.10)
RDW: 12.8 % (ref 11.0–15.0)
Total Lymphocyte: 12.6 %
WBC: 7.6 10*3/uL (ref 3.8–10.8)

## 2019-12-02 LAB — HEMOGLOBIN A1C
Hgb A1c MFr Bld: 7 % of total Hgb — ABNORMAL HIGH (ref <5.7)
Mean Plasma Glucose: 154 (calc)
eAG (mmol/L): 8.5 (calc)

## 2019-12-04 ENCOUNTER — Telehealth: Payer: Self-pay | Admitting: Family Medicine

## 2019-12-04 ENCOUNTER — Encounter: Payer: Self-pay | Admitting: Podiatry

## 2019-12-04 NOTE — Progress Notes (Signed)
  Subjective:  Patient ID: Briana Barr, female    DOB: 12/21/1940,  MRN: [id number]  Chief Complaint  Patient presents with  . Nail Problem    diabetic nail trim   79 y.o. female returns for the above complaint.  Patient presents with thickened elongated dystrophic toenails x10.  Patient has not been able to move down [name].  She would like to have for me to debride them down.  She has secondary complaint of xerosis.  I discussed with in the past with her however she would like to discuss it again as she forgot my recommendations.  She denies any other acute complaints.  Objective:  There were no vitals filed for this visit. Podiatric Exam: Vascular: Diminished dorsalis pedis and posterior tibial pulses noted bilaterally. Capillary return is immediate. Temperature gradient is WNL. Skin turgor WNL  Sensorium: Normal Semmes Weinstein monofilament test. Normal tactile sensation bilaterally. Nail Exam: Pt has thick disfigured discolored nails with subungual debris noted bilateral entire nail hallux through fifth toenails Ulcer Exam: There is no evidence of ulcer or pre-ulcerative changes or infection. Orthopedic Exam: Muscle tone and strength are WNL. No limitations in general ROM. No crepitus or effusions noted. HAV  B/L.  Hammer toes 2-5  B/L. Skin: No Porokeratosis. No infection or ulcers  Assessment & Plan:  Patient was evaluated and treated and all questions answered.  Peripheral vascular disease without soft tissue loss -I reviewed the ABIs PVR study that was done by the patient couple of months ago.  It appears that patient has poor vascular flow to the by lateral lower extremity given the waveforms.  I believe patient will benefit from an evaluation by Dr. Damita Dunnings.  I will defer further vascular management to him.  Xerosis/dry skin to bilateral plantar feet -I explained to the patient the etiology of xerosis and various treatment options were extensively discussed.  I explained to  the patient the importance of maintaining moisturization of the skin with application of over-the-counter lotion such as Eucerin or Luciderm.  I have asked the patient to apply this twice a day.  If unable to resolve patient will benefit from prescription lotion.  Onychomycosis with pain  -Nails palliatively debrided as below. -Educated on self-care  Procedure: Nail Debridement Rationale: pain  Type of Debridement: manual, sharp debridement. Instrumentation: Nail nipper, rotary burr. Number of Nails: 10  Procedures and Treatment: Consent by patient was obtained for treatment procedures. The patient understood the discussion of treatment and procedures well. All questions were answered thoroughly reviewed. Debridement of mycotic and hypertrophic toenails, 1 through 5 bilateral and clearing of subungual debris. No ulceration, no infection noted.  Return Visit-Office Procedure: Patient instructed to return to the office for a follow up visit 3 months for continued evaluation and treatment.  Boneta Lucks, DPM    No follow-ups on file.

## 2019-12-04 NOTE — Telephone Encounter (Signed)
Verbal orders given to [place] Physical Therapist from [facility] for PT

## 2019-12-05 ENCOUNTER — Telehealth: Payer: Self-pay

## 2019-12-05 DIAGNOSIS — E785 Hyperlipidemia, unspecified: Secondary | ICD-10-CM | POA: Diagnosis not present

## 2019-12-05 DIAGNOSIS — J441 Chronic obstructive pulmonary disease with (acute) exacerbation: Secondary | ICD-10-CM | POA: Diagnosis not present

## 2019-12-05 DIAGNOSIS — I82402 Acute embolism and thrombosis of unspecified deep veins of left lower extremity: Secondary | ICD-10-CM | POA: Diagnosis not present

## 2019-12-05 DIAGNOSIS — F0150 Vascular dementia without behavioral disturbance: Secondary | ICD-10-CM | POA: Diagnosis not present

## 2019-12-05 DIAGNOSIS — J9611 Chronic respiratory failure with hypoxia: Secondary | ICD-10-CM | POA: Diagnosis not present

## 2019-12-05 DIAGNOSIS — M810 Age-related osteoporosis without current pathological fracture: Secondary | ICD-10-CM | POA: Diagnosis not present

## 2019-12-05 NOTE — Telephone Encounter (Signed)
Roderic Ovens from [facility] called for verbal orders of being Hospice Attendee for Briana Barr, orders were given.

## 2019-12-07 ENCOUNTER — Telehealth: Payer: Self-pay

## 2019-12-07 DIAGNOSIS — M810 Age-related osteoporosis without current pathological fracture: Secondary | ICD-10-CM | POA: Diagnosis not present

## 2019-12-07 DIAGNOSIS — J441 Chronic obstructive pulmonary disease with (acute) exacerbation: Secondary | ICD-10-CM | POA: Diagnosis not present

## 2019-12-07 DIAGNOSIS — J9611 Chronic respiratory failure with hypoxia: Secondary | ICD-10-CM | POA: Diagnosis not present

## 2019-12-07 DIAGNOSIS — E785 Hyperlipidemia, unspecified: Secondary | ICD-10-CM | POA: Diagnosis not present

## 2019-12-07 DIAGNOSIS — I82402 Acute embolism and thrombosis of unspecified deep veins of left lower extremity: Secondary | ICD-10-CM | POA: Diagnosis not present

## 2019-12-07 DIAGNOSIS — F0150 Vascular dementia without behavioral disturbance: Secondary | ICD-10-CM | POA: Diagnosis not present

## 2019-12-07 NOTE — Chronic Care Management (AMB) (Deleted)
Chronic Care Management Pharmacy  Name: Briana Barr  MRN: [id number] DOB: [birth year]  Chief Complaint/ HPI  Briana Barr,  79 y.o. , female presents for their Initial CCM visit with the clinical pharmacist In office.  PCP : [name], MD  Their chronic conditions include: HTN, COPD, Diabetes.  Office Visits: 12/01/2019 Dennard Schaumann) -   Patient has severe end stage oxygen dependent COPD  Discontinued glipizide to avoid hypoglycemia, as she is sleeping for the majority of the day besides 7 or 8 hours  11/24/2019 Dennard Schaumann) -    Consult Visit:*** 12/01/2019 Posey Pronto, Podiatry)  Nail debridement  Patient cannot successfully trim her nails  Debrided with no further issue Medications: Outpatient Encounter Medications as of 12/11/2019  Medication Sig  . acetaZOLAMIDE (DIAMOX) 250 MG tablet TAKE 1 TABLET BY MOUTH EVERY DAY (Patient taking differently: Take 250 mg by mouth daily. )  . albuterol (PROAIR HFA) 108 (90 Base) MCG/ACT inhaler Inhale 2 puffs into the lungs every 6 (six) hours as needed for wheezing or shortness of breath.  Marland Kitchen aspirin 81 MG tablet Take 81 mg by mouth daily.  . Cholecalciferol (VITAMIN D3) 250 MCG (10000 UT) capsule Take 1 capsule (10,000 Units total) by mouth daily.  . Fluticasone-Umeclidin-Vilant (TRELEGY ELLIPTA) 100-62.5-25 MCG/INH AEPB Inhale 1 Inhaler into the lungs daily.  Marland Kitchen glimepiride (AMARYL) 1 MG tablet TAKE 1 TABLET BY MOUTH EVERY DAY IN THE MORNING  . HYDROcodone-acetaminophen (NORCO) 5-325 MG tablet Take 1 tablet by mouth 4 (four) times daily.  Marland Kitchen ipratropium-albuterol (DUONEB) 0.5-2.5 (3) MG/3ML SOLN Take 3 mLs by nebulization every 6 (six) hours as needed. (Patient taking differently: Take 3 mLs by nebulization every 6 (six) hours as needed (SOB, wheezing). )  . levofloxacin (LEVAQUIN) 500 MG tablet Take 1 tablet (500 mg total) by mouth daily.  Marland Kitchen losartan (COZAAR) 50 MG tablet Take 1 tablet (50 mg total) by mouth daily.  . metFORMIN  (GLUCOPHAGE) 500 MG tablet TAKE 1 TABLET BY MOUTH TWICE A DAY WITH MEALS  . Multiple Vitamins-Minerals (CENTRUM SILVER 50+WOMEN) TABS Take 1 tablet by mouth daily.  Marland Kitchen omeprazole (PRILOSEC) 20 MG capsule TAKE 1 CAPSULE BY MOUTH TWICE A DAY BEFORE A MEAL  . rosuvastatin (CRESTOR) 20 MG tablet TAKE 1 TABLET BY MOUTH EVERYDAY AT BEDTIME (Patient taking differently: Take 20 mg by mouth daily after breakfast. )   No facility-administered encounter medications on file as of 12/11/2019.     Current Diagnosis/Assessment:  Goals Addressed   None     COPD   Last spirometry score: ***  Gold Grade: {CHL HP Upstream Pharm COPD Gold VOJJK:0938182993} Current COPD Classification:  {CHL HP Upstream Pharm COPD Classification:[phone]}  Eosinophil count:   Lab Results  Component Value Date/Time   EOSPCT 2.6 12/01/2019 12:41 PM  %                               Eos (Absolute):  Lab Results  Component Value Date/Time   EOSABS 198 12/01/2019 12:41 PM    Tobacco Status:  Social History   Tobacco Use  Smoking Status Current Every Day Smoker  . Packs/day: 1.00  . Types: Cigarettes  Smokeless Tobacco Never Used  Tobacco Comment   started age 29    Patient has failed these meds in past: *** Patient is currently {CHL Controlled/Uncontrolled:[phone]} on the following medications:   Trelegy Ellipta 100-62.5-25mcg  Duoneb 0.5-2.5mg /26ml  Albuterol HFA 21mcg prn Using maintenance inhaler  regularly? {yes/no:20286} Frequency of rescue inhaler use:  {CHL HP Upstream Pharm Inhaler HUDJ:4970263785}  We discussed:  {CHL HP Upstream Pharmacy discussion:[phone]}  Plan  Continue {CHL HP Upstream Pharmacy Plans:[phone]} ,  Diabetes   Recent Relevant Labs: Lab Results  Component Value Date/Time   HGBA1C 7.0 (H) 12/01/2019 12:41 PM   HGBA1C 6.8 (H) 11/13/2019 05:00 PM   MICROALBUR 1.7 01/12/2019 10:17 AM   MICROALBUR 1.2 12/07/2017 09:39 AM     Checking BG: {CHL HP Blood Glucose  Monitoring Frequency:[phone]}  Recent FBG Readings: Recent pre-meal BG readings: *** Recent 2hr PP BG readings:  *** Recent HS BG readings: *** Patient has failed these meds in past: *** Patient is currently {CHL Controlled/Uncontrolled:[phone]} on the following medications:   Glimepiride 1mg  tablet  Metformin 500mg  twice daily  Last diabetic Foot exam:  Lab Results  Component Value Date/Time   HMDIABEYEEXA No Retinopathy 12/30/2017 12:00 AM    Last diabetic Eye exam: No results found for: HMDIABFOOTEX   We discussed: {CHL HP Upstream Pharmacy discussion:[phone]}  Plan  Continue {CHL HP Upstream Pharmacy Plans:[phone]} and  Hypertension   BP today is:  {CHL HP UPSTREAM Pharmacist BP ranges:8637217874}  Office blood pressures are  BP Readings from Last 3 Encounters:  12/01/19 (!) 144/68  11/24/19 110/60  11/20/19 102/67    Patient has failed these meds in the past: ***  Patient checks BP at home {CHL HP BP Monitoring Frequency:[phone]}  Patient home BP readings are ranging: *** Patient is currently {CHL Controlled/Uncontrolled:[phone]} on the following medications:   Losartan 50mg  daily We discussed {CHL HP Upstream Pharmacy discussion:[phone]}  Plan  Continue {CHL HP Upstream Pharmacy YIFOY:7741287867}     Vaccines   Reviewed and discussed patient's vaccination history.    Immunization History  Administered Date(s) Administered  . Fluad Quad(high Dose 65+) 01/12/2019  . Influenza,inj,Quad PF,6+ Mos 03/02/2016, 12/07/2017  . Pneumococcal Conjugate-13 04/13/2014  . Pneumococcal Polysaccharide-23 12/07/2017    Plan  Recommended patient receive *** vaccine in *** office.  Medication Management   . Miscellaneous medications: *** . OTC's: *** . Patient currently uses *** pharmacy.  Phone #  [phone] *** . Patient reports using *** method to organize medications and promote adherence. . Patient reports/denies missed doses of  medication ***   Beverly Milch, PharmD Clinical Pharmacist [place] [phone]

## 2019-12-07 NOTE — Telephone Encounter (Signed)
Briana Barr from [facility] called she had a conversation with Briana Barr daughter in Sports coach, they feel a Education officer, museum is not needed. Her son has moved in with her. Briana Barr just wanted to let you know.

## 2019-12-08 ENCOUNTER — Telehealth: Payer: Self-pay

## 2019-12-08 ENCOUNTER — Other Ambulatory Visit: Payer: Self-pay | Admitting: *Deleted

## 2019-12-08 DIAGNOSIS — J431 Panlobular emphysema: Secondary | ICD-10-CM

## 2019-12-08 DIAGNOSIS — M810 Age-related osteoporosis without current pathological fracture: Secondary | ICD-10-CM

## 2019-12-08 DIAGNOSIS — I82402 Acute embolism and thrombosis of unspecified deep veins of left lower extremity: Secondary | ICD-10-CM | POA: Diagnosis not present

## 2019-12-08 DIAGNOSIS — F0150 Vascular dementia without behavioral disturbance: Secondary | ICD-10-CM | POA: Diagnosis not present

## 2019-12-08 DIAGNOSIS — J9611 Chronic respiratory failure with hypoxia: Secondary | ICD-10-CM | POA: Diagnosis not present

## 2019-12-08 DIAGNOSIS — E118 Type 2 diabetes mellitus with unspecified complications: Secondary | ICD-10-CM

## 2019-12-08 DIAGNOSIS — E7800 Pure hypercholesterolemia, unspecified: Secondary | ICD-10-CM

## 2019-12-08 DIAGNOSIS — J441 Chronic obstructive pulmonary disease with (acute) exacerbation: Secondary | ICD-10-CM

## 2019-12-08 DIAGNOSIS — E785 Hyperlipidemia, unspecified: Secondary | ICD-10-CM | POA: Diagnosis not present

## 2019-12-08 NOTE — Telephone Encounter (Signed)
Received new referral for Palliative care. Patient did not admit to hospice. Currently, receiving home health physical therapy.

## 2019-12-11 ENCOUNTER — Ambulatory Visit: Payer: Medicare Other

## 2019-12-11 DIAGNOSIS — E785 Hyperlipidemia, unspecified: Secondary | ICD-10-CM | POA: Diagnosis not present

## 2019-12-11 DIAGNOSIS — F0150 Vascular dementia without behavioral disturbance: Secondary | ICD-10-CM | POA: Diagnosis not present

## 2019-12-11 DIAGNOSIS — J9611 Chronic respiratory failure with hypoxia: Secondary | ICD-10-CM | POA: Diagnosis not present

## 2019-12-11 DIAGNOSIS — J441 Chronic obstructive pulmonary disease with (acute) exacerbation: Secondary | ICD-10-CM | POA: Diagnosis not present

## 2019-12-11 DIAGNOSIS — I82402 Acute embolism and thrombosis of unspecified deep veins of left lower extremity: Secondary | ICD-10-CM | POA: Diagnosis not present

## 2019-12-11 DIAGNOSIS — M810 Age-related osteoporosis without current pathological fracture: Secondary | ICD-10-CM | POA: Diagnosis not present

## 2019-12-12 DIAGNOSIS — F0150 Vascular dementia without behavioral disturbance: Secondary | ICD-10-CM | POA: Diagnosis not present

## 2019-12-12 DIAGNOSIS — E785 Hyperlipidemia, unspecified: Secondary | ICD-10-CM | POA: Diagnosis not present

## 2019-12-12 DIAGNOSIS — J441 Chronic obstructive pulmonary disease with (acute) exacerbation: Secondary | ICD-10-CM | POA: Diagnosis not present

## 2019-12-12 DIAGNOSIS — M810 Age-related osteoporosis without current pathological fracture: Secondary | ICD-10-CM | POA: Diagnosis not present

## 2019-12-12 DIAGNOSIS — J9611 Chronic respiratory failure with hypoxia: Secondary | ICD-10-CM | POA: Diagnosis not present

## 2019-12-12 DIAGNOSIS — I82402 Acute embolism and thrombosis of unspecified deep veins of left lower extremity: Secondary | ICD-10-CM | POA: Diagnosis not present

## 2019-12-13 ENCOUNTER — Other Ambulatory Visit: Payer: Medicare Other | Admitting: *Deleted

## 2019-12-13 ENCOUNTER — Other Ambulatory Visit: Payer: Medicare Other

## 2019-12-13 ENCOUNTER — Other Ambulatory Visit: Payer: Self-pay

## 2019-12-13 VITALS — BP 104/69 | HR 70 | Temp 97.7°F | Resp 18

## 2019-12-13 DIAGNOSIS — Z515 Encounter for palliative care: Secondary | ICD-10-CM

## 2019-12-14 DIAGNOSIS — I82402 Acute embolism and thrombosis of unspecified deep veins of left lower extremity: Secondary | ICD-10-CM | POA: Diagnosis not present

## 2019-12-14 DIAGNOSIS — J9611 Chronic respiratory failure with hypoxia: Secondary | ICD-10-CM | POA: Diagnosis not present

## 2019-12-14 DIAGNOSIS — E785 Hyperlipidemia, unspecified: Secondary | ICD-10-CM | POA: Diagnosis not present

## 2019-12-14 DIAGNOSIS — M810 Age-related osteoporosis without current pathological fracture: Secondary | ICD-10-CM | POA: Diagnosis not present

## 2019-12-14 DIAGNOSIS — J441 Chronic obstructive pulmonary disease with (acute) exacerbation: Secondary | ICD-10-CM | POA: Diagnosis not present

## 2019-12-14 DIAGNOSIS — F0150 Vascular dementia without behavioral disturbance: Secondary | ICD-10-CM | POA: Diagnosis not present

## 2019-12-15 ENCOUNTER — Telehealth: Payer: Self-pay | Admitting: Family Medicine

## 2019-12-15 DIAGNOSIS — I82402 Acute embolism and thrombosis of unspecified deep veins of left lower extremity: Secondary | ICD-10-CM | POA: Diagnosis not present

## 2019-12-15 DIAGNOSIS — F0150 Vascular dementia without behavioral disturbance: Secondary | ICD-10-CM | POA: Diagnosis not present

## 2019-12-15 DIAGNOSIS — E785 Hyperlipidemia, unspecified: Secondary | ICD-10-CM | POA: Diagnosis not present

## 2019-12-15 DIAGNOSIS — J9611 Chronic respiratory failure with hypoxia: Secondary | ICD-10-CM | POA: Diagnosis not present

## 2019-12-15 DIAGNOSIS — J441 Chronic obstructive pulmonary disease with (acute) exacerbation: Secondary | ICD-10-CM | POA: Diagnosis not present

## 2019-12-15 DIAGNOSIS — M810 Age-related osteoporosis without current pathological fracture: Secondary | ICD-10-CM | POA: Diagnosis not present

## 2019-12-15 NOTE — Progress Notes (Signed)
  Chronic Care Management   Note  12/15/2019 Name: Briana Barr MRN: [id number] DOB: Aug 30, 1940  Briana Barr is a 79 y.o. year old female who is a primary care patient of [name], MD. I reached out to Tomoko Straker by phone today in response to a referral sent by Briana Barr's PCP, [name], MD.   Briana Barr was given information about Chronic Care Management services today including:  1. CCM service includes personalized support from designated clinical staff supervised by her physician, including individualized plan of care and coordination with other care providers 2. 24/7 contact phone numbers for assistance for urgent and routine care needs. 3. Service will only be billed when office clinical staff spend 20 minutes or more in a month to coordinate care. 4. Only one practitioner may furnish and bill the service in a calendar month. 5. The patient may stop CCM services at any time (effective at the end of the month) by phone call to the office staff.   Patient agreed to services and verbal consent obtained.   Follow up plan:   Carley Perdue [employer] Scheduler

## 2019-12-16 DIAGNOSIS — I82402 Acute embolism and thrombosis of unspecified deep veins of left lower extremity: Secondary | ICD-10-CM | POA: Diagnosis not present

## 2019-12-16 DIAGNOSIS — E785 Hyperlipidemia, unspecified: Secondary | ICD-10-CM | POA: Diagnosis not present

## 2019-12-16 DIAGNOSIS — F0150 Vascular dementia without behavioral disturbance: Secondary | ICD-10-CM | POA: Diagnosis not present

## 2019-12-16 DIAGNOSIS — J9611 Chronic respiratory failure with hypoxia: Secondary | ICD-10-CM | POA: Diagnosis not present

## 2019-12-16 DIAGNOSIS — M810 Age-related osteoporosis without current pathological fracture: Secondary | ICD-10-CM | POA: Diagnosis not present

## 2019-12-16 DIAGNOSIS — J441 Chronic obstructive pulmonary disease with (acute) exacerbation: Secondary | ICD-10-CM | POA: Diagnosis not present

## 2019-12-18 NOTE — Progress Notes (Signed)
COMMUNITY PALLIATIVE CARE SW NOTE  PATIENT NAME: Briana Barr DOB: November 28, 1940 MRN: [id number]  PRIMARY CARE PROVIDER: [name], MD  RESPONSIBLE PARTY:  Acct ID - Guarantor Home Phone Work Phone Relationship Acct Type  [id number] Briana Barr [phone]  Self P/F     [place], [place], [place] [address]     PLAN OF CARE and INTERVENTIONS:             1. GOALS OF CARE/ ADVANCE CARE PLANNING:  The goal is for patient to remain independently I her home. Patient is a DNR, forms left in the home. 2. SOCIAL/EMOTIONAL/SPIRITUAL ASSESSMENT/ INTERVENTIONS:  SW and RN-Briana Howardccompleted an initial face-to-face visit with patient at her home where she was present with her son-Briana Barr and daughter-in-law-Briana Barr. Introduction to the team, education about the palliative care program was provided. Patient and her family provided verbal consent to services. The patient and family provided a medical and social history on patient. Patient is on 6 liters of continuous 02 via nasal canula. She is SOB with any activity and take a few moments to recover. She is hard of hearing. Patient ambulates with a cane. Patient is diabetic, her son prepares her meals and her appetite is good. Patient also takes an Ensure 2x's/day. The family check her blood sugars and blood pressures daily. Patient is sleeping more most of the day, only getting up for meals and appointments. Her family is encouraging and attempting to engage her in more activities. Patient is receiving physical and occupational therapy 2x's week each, and nursing services through [facility].  Patient was born and raised in [place]. She completed high school. She was an Sales promotion account executive. She is twice widowed and have three sons. Patient is Psychologist, forensic by faith. SW provided resources for day programs and pet therapy. SW also assessed needs and comfort of patient, observation, education, supportive presence and resources.SW will provide  supportive ongoing psychosocial assessment of needs, comfort and coping.  3. PATIENT/CAREGIVER EDUCATION/ COPING:  Patient is alert and oriented x3. She is hard of hearing, so verbalizations are delayed. She appears to be coping well. The family seems realistic about patient's health and is supportive.  4. PERSONAL EMERGENCY PLAN: Patient's oldest son live in the home with her and another son lives nearby. 911 can be activated for emergencies. 5. COMMUNITY RESOURCES COORDINATION/ HEALTH CARE NAVIGATION:  Patient is receiving PT, OT, and nursing services through [facility]. SW provided resources for day  programs and pet therapy. 6. FINANCIAL/LEGAL CONCERNS/INTERVENTIONS: None     SOCIAL HX:  Social History   Tobacco Use  . Smoking status: Current Every Day Smoker    Packs/day: 1.00    Types: Cigarettes  . Smokeless tobacco: Never Used  . Tobacco comment: started age 65  Substance Use Topics  . Alcohol use: No    CODE STATUS: DNR ADVANCED DIRECTIVES: No MOST FORM COMPLETE:  No HOSPICE EDUCATION PROVIDED: No  PPS: Patient is alert and oriented x3, but hard of hearing. Patient is independent for ADL's.   Duration of visit and documentation: 60 minutes.     [address] [place], [place]

## 2019-12-19 DIAGNOSIS — E785 Hyperlipidemia, unspecified: Secondary | ICD-10-CM | POA: Diagnosis not present

## 2019-12-19 DIAGNOSIS — M810 Age-related osteoporosis without current pathological fracture: Secondary | ICD-10-CM | POA: Diagnosis not present

## 2019-12-19 DIAGNOSIS — F0150 Vascular dementia without behavioral disturbance: Secondary | ICD-10-CM | POA: Diagnosis not present

## 2019-12-19 DIAGNOSIS — I82402 Acute embolism and thrombosis of unspecified deep veins of left lower extremity: Secondary | ICD-10-CM | POA: Diagnosis not present

## 2019-12-19 DIAGNOSIS — J9611 Chronic respiratory failure with hypoxia: Secondary | ICD-10-CM | POA: Diagnosis not present

## 2019-12-19 DIAGNOSIS — J441 Chronic obstructive pulmonary disease with (acute) exacerbation: Secondary | ICD-10-CM | POA: Diagnosis not present

## 2019-12-19 NOTE — Progress Notes (Signed)
COMMUNITY PALLIATIVE CARE RN NOTE  PATIENT NAME: Briana Barr DOB: 10/06/1940 MRN: [id number]  PRIMARY CARE PROVIDER: Pickard, Warren T, MD  RESPONSIBLE PARTY: David (son) Acct ID - Guarantor Home Phone Work Phone Relationship Acct Type  [id number] - Kassis,Melani [phone]  Self P/F     [address], [place], [place] [address]   Covid-19 Pre-screening Negative  PLAN OF CARE and INTERVENTION:  1. ADVANCE CARE PLANNING/GOALS OF CARE: Goal is for patient to remain in her home and avoid hospitalizations. DNR given to patient/family as requested 2. PATIENT/CAREGIVER EDUCATION: Explained Palliative care services, symptom management, safe mobility 3. DISEASE STATUS: Joint visit made with Palliative care SW, M. Lonon. Met with patient, son David, and daughter-in-law. Observed patient ambulate using her cane into the living room for visit. She denies pain at this time. She is forgetful and intermittently confused. She is able to answer some simple questions and make her needs known. She is currently on oxygen at 6L/min via [place]. She does experience shortness of breath with exertion, but recovers fairly quickly while at rest. Her son, David, just recently moved in with patient for 24/7 supervision. She requires assistance with showers, but is able to toilet, feed and dress herself independently. She does sleep much of the day. She is currently working with PT/OT and has a home health RN through [facility]. OT is coming to visit patient this afternoon. Her intake is variable. She only ate 1 meal yesterday (100%) as she was asleep the rest of the time. She does drink Ensure twice daily for nutritional supplementation. She denies dysphagia. She is a diabetic and takes Metformin. She is continent of both bowel and bladder. They are agreeable to future palliative care visits. Next visit scheduled for Oct, 1, 2021.   HISTORY OF PRESENT ILLNESS: This is a 79 yo female with a history of HTN, COPD and DM  II. Palliative care team asked to follow patient for additional support. Will visit patient monthly and PRN.  CODE STATUS: DNR (2 signed copies left in the home as requested) ADVANCED DIRECTIVES: Y MOST FORM: no PPS: 50%   PHYSICAL EXAM:   VITALS: Today's Vitals   12/13/19 1140  BP: 104/69  Pulse: 70  Resp: 18  Temp: 97.7 F (36.5 C)  TempSrc: Temporal  SpO2: 93%  PainSc: 0-No pain    LUNGS: decreased breath sounds CARDIAC: Cor RRR EXTREMITIES: No edema SKIN: No skin issues  NEURO: Alert and oriented x 2 (person/place), forgetful, generalized weakness, ambulatory w/cane   (Duration of visit and documentation 75 minutes)    , RN BSN 

## 2019-12-20 DIAGNOSIS — F0150 Vascular dementia without behavioral disturbance: Secondary | ICD-10-CM | POA: Diagnosis not present

## 2019-12-20 DIAGNOSIS — I82402 Acute embolism and thrombosis of unspecified deep veins of left lower extremity: Secondary | ICD-10-CM | POA: Diagnosis not present

## 2019-12-20 DIAGNOSIS — M810 Age-related osteoporosis without current pathological fracture: Secondary | ICD-10-CM | POA: Diagnosis not present

## 2019-12-20 DIAGNOSIS — J441 Chronic obstructive pulmonary disease with (acute) exacerbation: Secondary | ICD-10-CM | POA: Diagnosis not present

## 2019-12-20 DIAGNOSIS — J9611 Chronic respiratory failure with hypoxia: Secondary | ICD-10-CM | POA: Diagnosis not present

## 2019-12-20 DIAGNOSIS — E785 Hyperlipidemia, unspecified: Secondary | ICD-10-CM | POA: Diagnosis not present

## 2019-12-21 ENCOUNTER — Telehealth: Payer: Self-pay | Admitting: Family Medicine

## 2019-12-21 DIAGNOSIS — J9621 Acute and chronic respiratory failure with hypoxia: Secondary | ICD-10-CM | POA: Diagnosis not present

## 2019-12-21 DIAGNOSIS — Z95820 Peripheral vascular angioplasty status with implants and grafts: Secondary | ICD-10-CM | POA: Diagnosis not present

## 2019-12-21 DIAGNOSIS — K635 Polyp of colon: Secondary | ICD-10-CM | POA: Diagnosis not present

## 2019-12-21 DIAGNOSIS — Z7984 Long term (current) use of oral hypoglycemic drugs: Secondary | ICD-10-CM | POA: Diagnosis not present

## 2019-12-21 DIAGNOSIS — F17210 Nicotine dependence, cigarettes, uncomplicated: Secondary | ICD-10-CM | POA: Diagnosis not present

## 2019-12-21 DIAGNOSIS — M810 Age-related osteoporosis without current pathological fracture: Secondary | ICD-10-CM | POA: Diagnosis not present

## 2019-12-21 DIAGNOSIS — E113599 Type 2 diabetes mellitus with proliferative diabetic retinopathy without macular edema, unspecified eye: Secondary | ICD-10-CM | POA: Diagnosis not present

## 2019-12-21 DIAGNOSIS — I82402 Acute embolism and thrombosis of unspecified deep veins of left lower extremity: Secondary | ICD-10-CM | POA: Diagnosis not present

## 2019-12-21 DIAGNOSIS — F0150 Vascular dementia without behavioral disturbance: Secondary | ICD-10-CM | POA: Diagnosis not present

## 2019-12-21 DIAGNOSIS — K2270 Barrett's esophagus without dysplasia: Secondary | ICD-10-CM | POA: Diagnosis not present

## 2019-12-21 DIAGNOSIS — K559 Vascular disorder of intestine, unspecified: Secondary | ICD-10-CM | POA: Diagnosis not present

## 2019-12-21 DIAGNOSIS — K8020 Calculus of gallbladder without cholecystitis without obstruction: Secondary | ICD-10-CM | POA: Diagnosis not present

## 2019-12-21 DIAGNOSIS — E1151 Type 2 diabetes mellitus with diabetic peripheral angiopathy without gangrene: Secondary | ICD-10-CM | POA: Diagnosis not present

## 2019-12-21 DIAGNOSIS — N281 Cyst of kidney, acquired: Secondary | ICD-10-CM | POA: Diagnosis not present

## 2019-12-21 DIAGNOSIS — J439 Emphysema, unspecified: Secondary | ICD-10-CM | POA: Diagnosis not present

## 2019-12-21 DIAGNOSIS — J154 Pneumonia due to other streptococci: Secondary | ICD-10-CM | POA: Diagnosis not present

## 2019-12-21 DIAGNOSIS — E785 Hyperlipidemia, unspecified: Secondary | ICD-10-CM | POA: Diagnosis not present

## 2019-12-21 DIAGNOSIS — H409 Unspecified glaucoma: Secondary | ICD-10-CM | POA: Diagnosis not present

## 2019-12-21 DIAGNOSIS — Z9981 Dependence on supplemental oxygen: Secondary | ICD-10-CM | POA: Diagnosis not present

## 2019-12-21 DIAGNOSIS — I10 Essential (primary) hypertension: Secondary | ICD-10-CM | POA: Diagnosis not present

## 2019-12-21 NOTE — Telephone Encounter (Signed)
CB# [phone] Mal[facility] from [facility] need a verbal OT therapy for 1 once a week for 7 weeks

## 2019-12-22 NOTE — Telephone Encounter (Signed)
Call placed to patient. No answer. No VM.  

## 2020-01-03 DIAGNOSIS — I10 Essential (primary) hypertension: Secondary | ICD-10-CM | POA: Diagnosis not present

## 2020-01-03 DIAGNOSIS — J439 Emphysema, unspecified: Secondary | ICD-10-CM | POA: Diagnosis not present

## 2020-01-03 DIAGNOSIS — J9621 Acute and chronic respiratory failure with hypoxia: Secondary | ICD-10-CM | POA: Diagnosis not present

## 2020-01-03 DIAGNOSIS — E785 Hyperlipidemia, unspecified: Secondary | ICD-10-CM | POA: Diagnosis not present

## 2020-01-03 DIAGNOSIS — E1151 Type 2 diabetes mellitus with diabetic peripheral angiopathy without gangrene: Secondary | ICD-10-CM | POA: Diagnosis not present

## 2020-01-03 DIAGNOSIS — F0150 Vascular dementia without behavioral disturbance: Secondary | ICD-10-CM | POA: Diagnosis not present

## 2020-01-04 DIAGNOSIS — E785 Hyperlipidemia, unspecified: Secondary | ICD-10-CM | POA: Diagnosis not present

## 2020-01-04 DIAGNOSIS — E1151 Type 2 diabetes mellitus with diabetic peripheral angiopathy without gangrene: Secondary | ICD-10-CM | POA: Diagnosis not present

## 2020-01-04 DIAGNOSIS — I10 Essential (primary) hypertension: Secondary | ICD-10-CM | POA: Diagnosis not present

## 2020-01-04 DIAGNOSIS — J439 Emphysema, unspecified: Secondary | ICD-10-CM | POA: Diagnosis not present

## 2020-01-04 DIAGNOSIS — F0150 Vascular dementia without behavioral disturbance: Secondary | ICD-10-CM | POA: Diagnosis not present

## 2020-01-04 DIAGNOSIS — J9621 Acute and chronic respiratory failure with hypoxia: Secondary | ICD-10-CM | POA: Diagnosis not present

## 2020-01-05 DIAGNOSIS — E1151 Type 2 diabetes mellitus with diabetic peripheral angiopathy without gangrene: Secondary | ICD-10-CM | POA: Diagnosis not present

## 2020-01-05 DIAGNOSIS — J439 Emphysema, unspecified: Secondary | ICD-10-CM | POA: Diagnosis not present

## 2020-01-05 DIAGNOSIS — J9621 Acute and chronic respiratory failure with hypoxia: Secondary | ICD-10-CM | POA: Diagnosis not present

## 2020-01-05 DIAGNOSIS — E785 Hyperlipidemia, unspecified: Secondary | ICD-10-CM | POA: Diagnosis not present

## 2020-01-05 DIAGNOSIS — F0150 Vascular dementia without behavioral disturbance: Secondary | ICD-10-CM | POA: Diagnosis not present

## 2020-01-05 DIAGNOSIS — I10 Essential (primary) hypertension: Secondary | ICD-10-CM | POA: Diagnosis not present

## 2020-01-08 ENCOUNTER — Other Ambulatory Visit: Payer: Self-pay | Admitting: Family Medicine

## 2020-01-09 ENCOUNTER — Telehealth: Payer: Self-pay

## 2020-01-09 DIAGNOSIS — I10 Essential (primary) hypertension: Secondary | ICD-10-CM | POA: Diagnosis not present

## 2020-01-09 DIAGNOSIS — E1151 Type 2 diabetes mellitus with diabetic peripheral angiopathy without gangrene: Secondary | ICD-10-CM | POA: Diagnosis not present

## 2020-01-09 DIAGNOSIS — J439 Emphysema, unspecified: Secondary | ICD-10-CM | POA: Diagnosis not present

## 2020-01-09 DIAGNOSIS — J9621 Acute and chronic respiratory failure with hypoxia: Secondary | ICD-10-CM | POA: Diagnosis not present

## 2020-01-09 DIAGNOSIS — E785 Hyperlipidemia, unspecified: Secondary | ICD-10-CM | POA: Diagnosis not present

## 2020-01-09 DIAGNOSIS — F0150 Vascular dementia without behavioral disturbance: Secondary | ICD-10-CM | POA: Diagnosis not present

## 2020-01-09 NOTE — Telephone Encounter (Signed)
Non applicable 

## 2020-01-11 DIAGNOSIS — E785 Hyperlipidemia, unspecified: Secondary | ICD-10-CM | POA: Diagnosis not present

## 2020-01-11 DIAGNOSIS — J9621 Acute and chronic respiratory failure with hypoxia: Secondary | ICD-10-CM | POA: Diagnosis not present

## 2020-01-11 DIAGNOSIS — I10 Essential (primary) hypertension: Secondary | ICD-10-CM | POA: Diagnosis not present

## 2020-01-11 DIAGNOSIS — F0150 Vascular dementia without behavioral disturbance: Secondary | ICD-10-CM | POA: Diagnosis not present

## 2020-01-11 DIAGNOSIS — E1151 Type 2 diabetes mellitus with diabetic peripheral angiopathy without gangrene: Secondary | ICD-10-CM | POA: Diagnosis not present

## 2020-01-11 DIAGNOSIS — J439 Emphysema, unspecified: Secondary | ICD-10-CM | POA: Diagnosis not present

## 2020-01-12 ENCOUNTER — Other Ambulatory Visit: Payer: Self-pay

## 2020-01-12 ENCOUNTER — Other Ambulatory Visit: Payer: Medicare Other

## 2020-01-12 DIAGNOSIS — Z515 Encounter for palliative care: Secondary | ICD-10-CM

## 2020-01-12 NOTE — Progress Notes (Signed)
COMMUNITY PALLIATIVE CARE SW NOTE  PATIENT NAME: Briana Barr DOB: 1940-08-23 MRN: [id number]  PRIMARY CARE PROVIDER: [name], MD  RESPONSIBLE PARTY:  Acct ID - Guarantor Home Phone Work Phone Relationship Acct Type  [id number] Briana, Barr [phone]  Self P/F     [place], [place], [place] [address]     PLAN OF CARE and INTERVENTIONS:             1. GOALS OF CARE/ ADVANCE CARE PLANNING:  The goal is for patient to remain independently I her home. Patient is a DNR. 2. SOCIAL/EMOTIONAL/SPIRITUAL ASSESSMENT/ INTERVENTIONS:  SW visited patient at her home. She was in bed during this visit. She was sleeping. She was wearing her 02 and did not appear to be in any pain or discomfort.  Patient's son/PCG-Briana Barr was present and provided an update on patient's status. Briana Barr advised that patient is eating small meals, but overall her appetite is good. She remains on continuous 02. Patient has recently reported intermittent pain to her back. She is short of breath with any activity. Her occupational therapy has concluded and she made some progress. She has two more visits with the personal care aide. Patient continues to need prompting and standby assistance to do personal care. Patient is choosing to stay in bed and sleep most of the day. Her short term memory is more profound, as evident by increased confusion and forgetfulness. She remains hard of hearing. SW provided supportive counseling and active listening to patient's son/PCG as he is experiencing caregiver fatigue and some depression. He will begin a part-time job and Designer, industrial/product as a Chiropractor and to build his friend and support network. However, he remains committed to patient's care. SW also assessed needs and comfort of patient, observation, education, supportive presence and resources.SW will provide supportive ongoing psychosocial assessment of needs, comfort and coping.  3. PATIENT/CAREGIVER EDUCATION/  COPING:  Patient as sleeping during this visit. Her son is making efforts to expand his support network and is coping adequately.  4. PERSONAL EMERGENCY PLAN:  911 can be activated for emergencies.  5. COMMUNITY RESOURCES COORDINATION/ HEALTH CARE NAVIGATION:  Patient is receives personal care services through [facility]. 6. FINANCIAL/LEGAL CONCERNS/INTERVENTIONS:  None     SOCIAL HX:  Social History   Tobacco Use  . Smoking status: Current Every Day Smoker    Packs/day: 1.00    Types: Cigarettes  . Smokeless tobacco: Never Used  . Tobacco comment: started age 82  Substance Use Topics  . Alcohol use: No    CODE STATUS: Yes ADVANCED DIRECTIVES: No MOST FORM COMPLETE:  No HOSPICE EDUCATION PROVIDED: No  PPS: Patient is alert and oriented x3, but is intermittently confused and her short term memory deficits are increasing; she is hard of hearing. Patient is independent for ADL's but is requiring standby assistance and prompting.    Duration of visit and documentation: 60 minutes      Briana Puller, LCSW

## 2020-01-14 DIAGNOSIS — J439 Emphysema, unspecified: Secondary | ICD-10-CM | POA: Diagnosis not present

## 2020-01-14 DIAGNOSIS — E785 Hyperlipidemia, unspecified: Secondary | ICD-10-CM | POA: Diagnosis not present

## 2020-01-14 DIAGNOSIS — E1151 Type 2 diabetes mellitus with diabetic peripheral angiopathy without gangrene: Secondary | ICD-10-CM | POA: Diagnosis not present

## 2020-01-14 DIAGNOSIS — J9621 Acute and chronic respiratory failure with hypoxia: Secondary | ICD-10-CM | POA: Diagnosis not present

## 2020-01-14 DIAGNOSIS — I10 Essential (primary) hypertension: Secondary | ICD-10-CM | POA: Diagnosis not present

## 2020-01-14 DIAGNOSIS — F0150 Vascular dementia without behavioral disturbance: Secondary | ICD-10-CM | POA: Diagnosis not present

## 2020-01-17 DIAGNOSIS — J9621 Acute and chronic respiratory failure with hypoxia: Secondary | ICD-10-CM | POA: Diagnosis not present

## 2020-01-17 DIAGNOSIS — J439 Emphysema, unspecified: Secondary | ICD-10-CM | POA: Diagnosis not present

## 2020-01-17 DIAGNOSIS — I10 Essential (primary) hypertension: Secondary | ICD-10-CM | POA: Diagnosis not present

## 2020-01-17 DIAGNOSIS — E1151 Type 2 diabetes mellitus with diabetic peripheral angiopathy without gangrene: Secondary | ICD-10-CM | POA: Diagnosis not present

## 2020-01-17 DIAGNOSIS — E785 Hyperlipidemia, unspecified: Secondary | ICD-10-CM | POA: Diagnosis not present

## 2020-01-17 DIAGNOSIS — F0150 Vascular dementia without behavioral disturbance: Secondary | ICD-10-CM | POA: Diagnosis not present

## 2020-01-18 DIAGNOSIS — J439 Emphysema, unspecified: Secondary | ICD-10-CM | POA: Diagnosis not present

## 2020-01-18 DIAGNOSIS — E785 Hyperlipidemia, unspecified: Secondary | ICD-10-CM | POA: Diagnosis not present

## 2020-01-18 DIAGNOSIS — J9621 Acute and chronic respiratory failure with hypoxia: Secondary | ICD-10-CM | POA: Diagnosis not present

## 2020-01-18 DIAGNOSIS — E1151 Type 2 diabetes mellitus with diabetic peripheral angiopathy without gangrene: Secondary | ICD-10-CM | POA: Diagnosis not present

## 2020-01-18 DIAGNOSIS — I10 Essential (primary) hypertension: Secondary | ICD-10-CM | POA: Diagnosis not present

## 2020-01-18 DIAGNOSIS — F0150 Vascular dementia without behavioral disturbance: Secondary | ICD-10-CM | POA: Diagnosis not present

## 2020-01-20 DIAGNOSIS — K635 Polyp of colon: Secondary | ICD-10-CM | POA: Diagnosis not present

## 2020-01-20 DIAGNOSIS — I10 Essential (primary) hypertension: Secondary | ICD-10-CM | POA: Diagnosis not present

## 2020-01-20 DIAGNOSIS — Z7984 Long term (current) use of oral hypoglycemic drugs: Secondary | ICD-10-CM | POA: Diagnosis not present

## 2020-01-20 DIAGNOSIS — I82402 Acute embolism and thrombosis of unspecified deep veins of left lower extremity: Secondary | ICD-10-CM | POA: Diagnosis not present

## 2020-01-20 DIAGNOSIS — Z9981 Dependence on supplemental oxygen: Secondary | ICD-10-CM | POA: Diagnosis not present

## 2020-01-20 DIAGNOSIS — E113599 Type 2 diabetes mellitus with proliferative diabetic retinopathy without macular edema, unspecified eye: Secondary | ICD-10-CM | POA: Diagnosis not present

## 2020-01-20 DIAGNOSIS — E785 Hyperlipidemia, unspecified: Secondary | ICD-10-CM | POA: Diagnosis not present

## 2020-01-20 DIAGNOSIS — J439 Emphysema, unspecified: Secondary | ICD-10-CM | POA: Diagnosis not present

## 2020-01-20 DIAGNOSIS — K559 Vascular disorder of intestine, unspecified: Secondary | ICD-10-CM | POA: Diagnosis not present

## 2020-01-20 DIAGNOSIS — N281 Cyst of kidney, acquired: Secondary | ICD-10-CM | POA: Diagnosis not present

## 2020-01-20 DIAGNOSIS — Z95820 Peripheral vascular angioplasty status with implants and grafts: Secondary | ICD-10-CM | POA: Diagnosis not present

## 2020-01-20 DIAGNOSIS — E1151 Type 2 diabetes mellitus with diabetic peripheral angiopathy without gangrene: Secondary | ICD-10-CM | POA: Diagnosis not present

## 2020-01-20 DIAGNOSIS — K8020 Calculus of gallbladder without cholecystitis without obstruction: Secondary | ICD-10-CM | POA: Diagnosis not present

## 2020-01-20 DIAGNOSIS — M810 Age-related osteoporosis without current pathological fracture: Secondary | ICD-10-CM | POA: Diagnosis not present

## 2020-01-20 DIAGNOSIS — F17210 Nicotine dependence, cigarettes, uncomplicated: Secondary | ICD-10-CM | POA: Diagnosis not present

## 2020-01-20 DIAGNOSIS — J9621 Acute and chronic respiratory failure with hypoxia: Secondary | ICD-10-CM | POA: Diagnosis not present

## 2020-01-20 DIAGNOSIS — J154 Pneumonia due to other streptococci: Secondary | ICD-10-CM | POA: Diagnosis not present

## 2020-01-20 DIAGNOSIS — H409 Unspecified glaucoma: Secondary | ICD-10-CM | POA: Diagnosis not present

## 2020-01-20 DIAGNOSIS — F0150 Vascular dementia without behavioral disturbance: Secondary | ICD-10-CM | POA: Diagnosis not present

## 2020-01-20 DIAGNOSIS — K2270 Barrett's esophagus without dysplasia: Secondary | ICD-10-CM | POA: Diagnosis not present

## 2020-01-25 DIAGNOSIS — F0150 Vascular dementia without behavioral disturbance: Secondary | ICD-10-CM | POA: Diagnosis not present

## 2020-01-25 DIAGNOSIS — J439 Emphysema, unspecified: Secondary | ICD-10-CM | POA: Diagnosis not present

## 2020-01-25 DIAGNOSIS — J9621 Acute and chronic respiratory failure with hypoxia: Secondary | ICD-10-CM | POA: Diagnosis not present

## 2020-01-25 DIAGNOSIS — E785 Hyperlipidemia, unspecified: Secondary | ICD-10-CM | POA: Diagnosis not present

## 2020-01-25 DIAGNOSIS — E1151 Type 2 diabetes mellitus with diabetic peripheral angiopathy without gangrene: Secondary | ICD-10-CM | POA: Diagnosis not present

## 2020-01-25 DIAGNOSIS — I10 Essential (primary) hypertension: Secondary | ICD-10-CM | POA: Diagnosis not present

## 2020-01-26 ENCOUNTER — Other Ambulatory Visit: Payer: Self-pay | Admitting: Family Medicine

## 2020-01-31 ENCOUNTER — Other Ambulatory Visit: Payer: Self-pay | Admitting: Family Medicine

## 2020-01-31 DIAGNOSIS — J439 Emphysema, unspecified: Secondary | ICD-10-CM | POA: Diagnosis not present

## 2020-01-31 DIAGNOSIS — J9621 Acute and chronic respiratory failure with hypoxia: Secondary | ICD-10-CM | POA: Diagnosis not present

## 2020-01-31 DIAGNOSIS — E785 Hyperlipidemia, unspecified: Secondary | ICD-10-CM | POA: Diagnosis not present

## 2020-01-31 DIAGNOSIS — E1151 Type 2 diabetes mellitus with diabetic peripheral angiopathy without gangrene: Secondary | ICD-10-CM | POA: Diagnosis not present

## 2020-01-31 DIAGNOSIS — I10 Essential (primary) hypertension: Secondary | ICD-10-CM | POA: Diagnosis not present

## 2020-01-31 DIAGNOSIS — F0150 Vascular dementia without behavioral disturbance: Secondary | ICD-10-CM | POA: Diagnosis not present

## 2020-01-31 IMAGING — CT CT ABD-PELV W/ CM
2 of 5 series · 16 of 46 positions shown, 18 images · IV contrast (ISOVUE)
Comparison: 09/28/2016 CT abdomen and pelvis. 10/26/2016 MRI of the
abdomen.

CLINICAL DATA: 77 y/o F; left lower quadrant abdominal pain with
nausea and vomiting.

EXAM:
CT ABDOMEN AND PELVIS WITH CONTRAST
TECHNIQUE: Multidetector CT imaging of the abdomen and pelvis was performed
using the standard protocol following bolus administration of
intravenous contrast.
CONTRAST:  100mL OQLVTR-9FF IOPAMIDOL (OQLVTR-9FF) INJECTION 61%

[Series 2: axial st · axial · 0.77mm/px · z∈[-431,-76]mm · 13 of 83 slices shown, 15 images]
[im 6/83  soft-tissue]
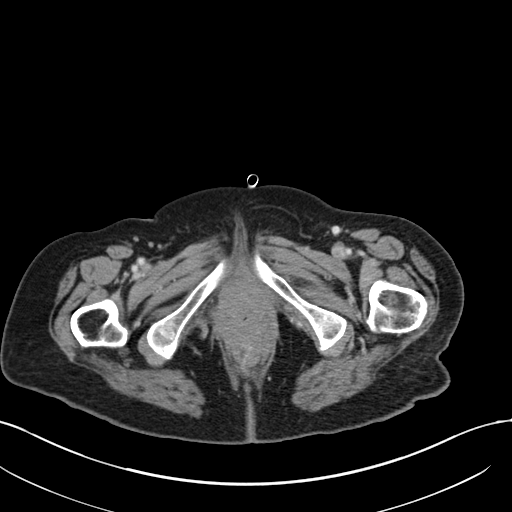
[im 6/83  bone]
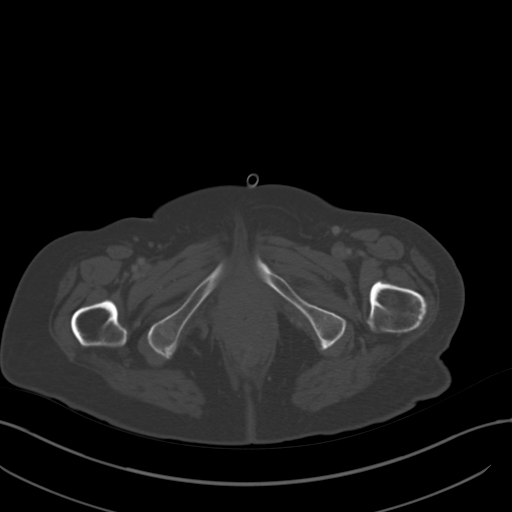
[im 11/83  soft-tissue]
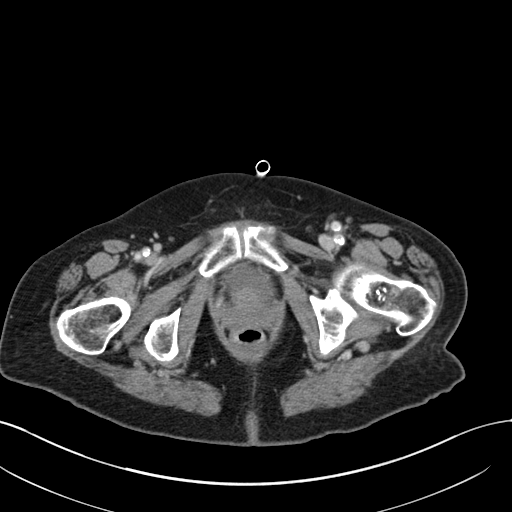
[im 17/83  soft-tissue]
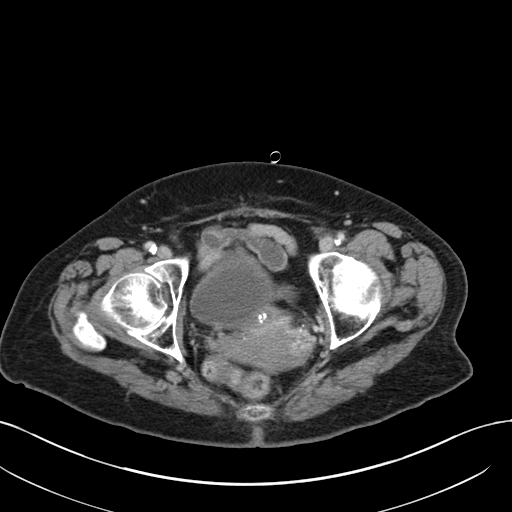
[im 22/83  soft-tissue]
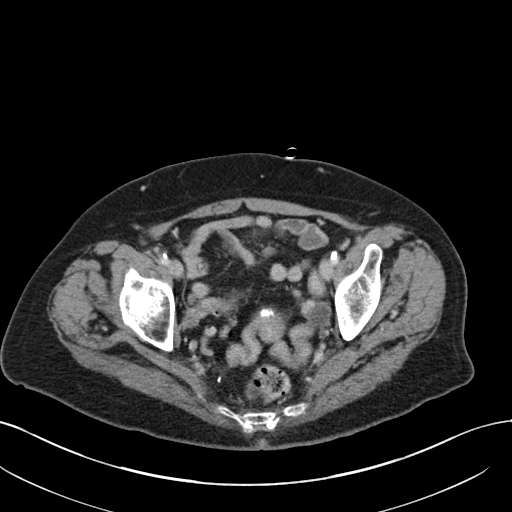
[im 28/83  soft-tissue]
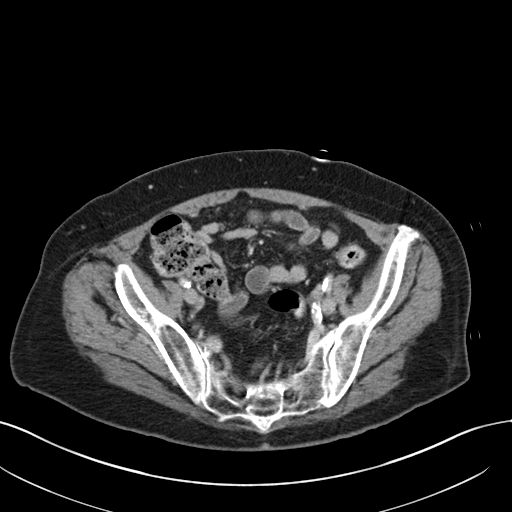
[im 33/83  soft-tissue]
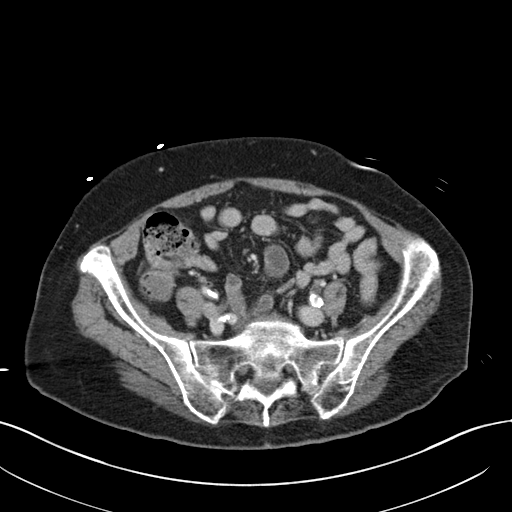
[im 44/83  soft-tissue]
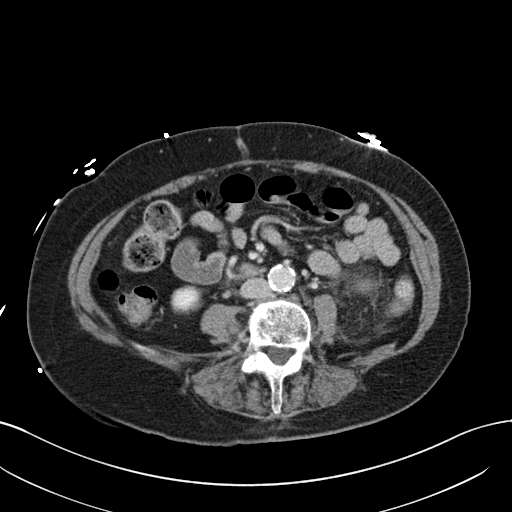
[im 50/83  soft-tissue]
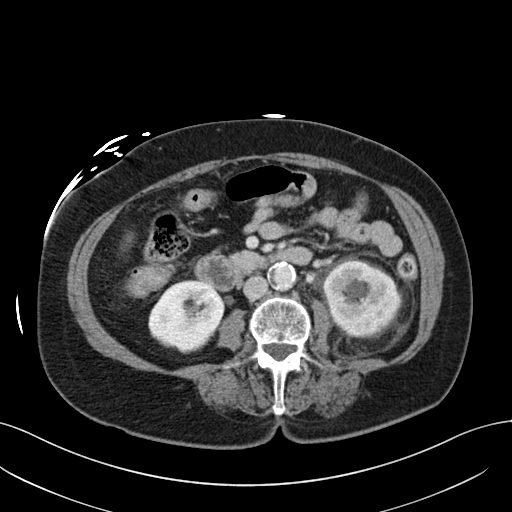
[im 55/83  soft-tissue]
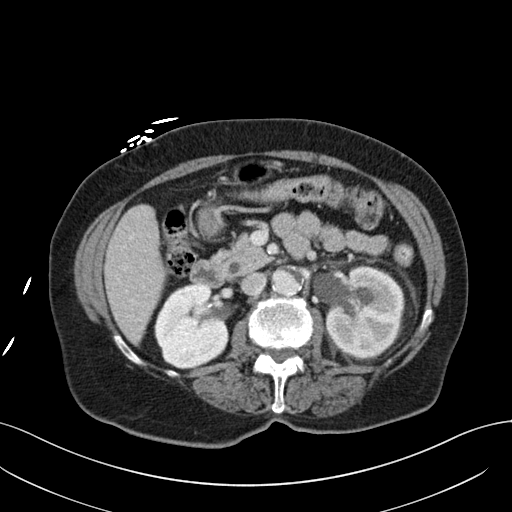
[im 55/83  bone]
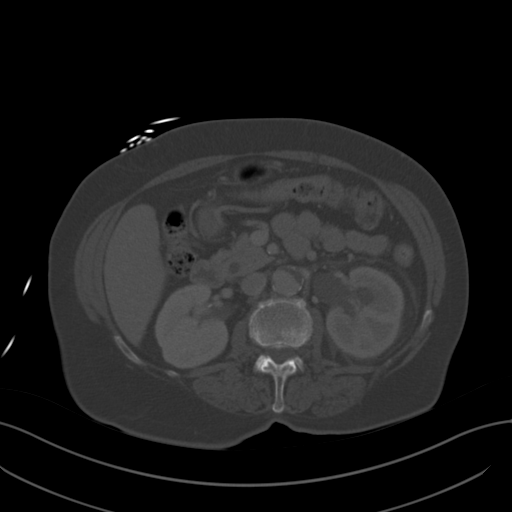
[im 61/83  soft-tissue]
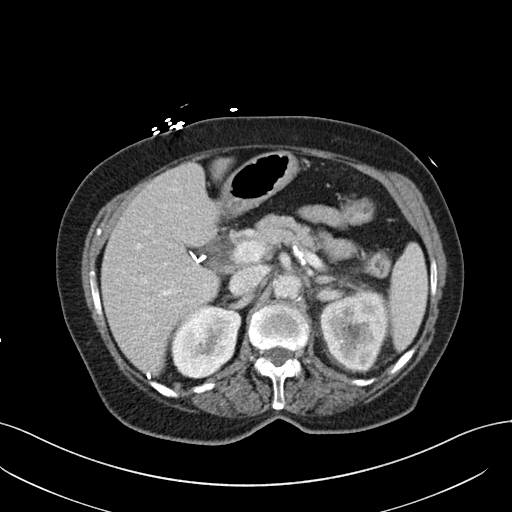
[im 66/83  soft-tissue]
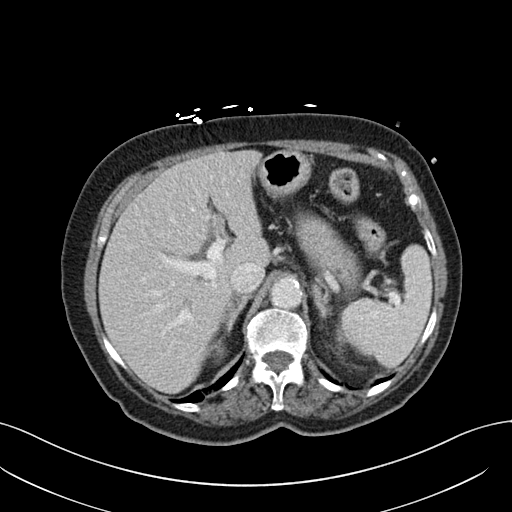
[im 72/83  soft-tissue]
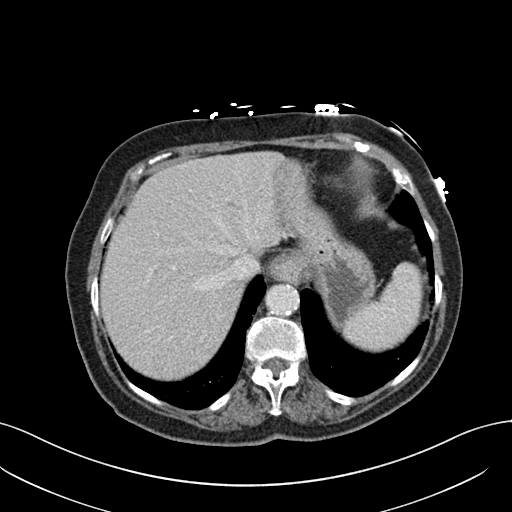
[im 77/83  soft-tissue]
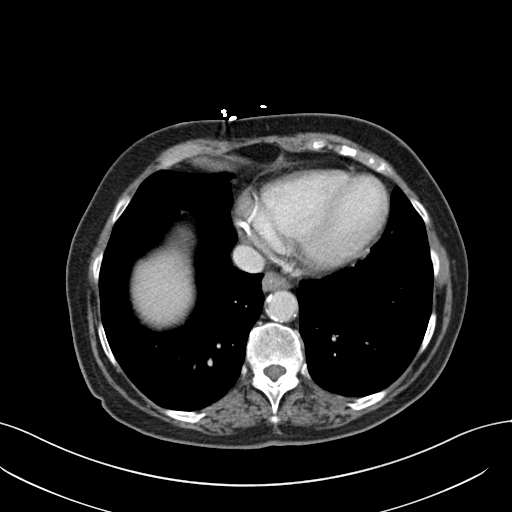

[Series 5: coronal st · coronal · 0.66mm/px · 3 of 133 slices shown]
[im 45/133  soft-tissue]
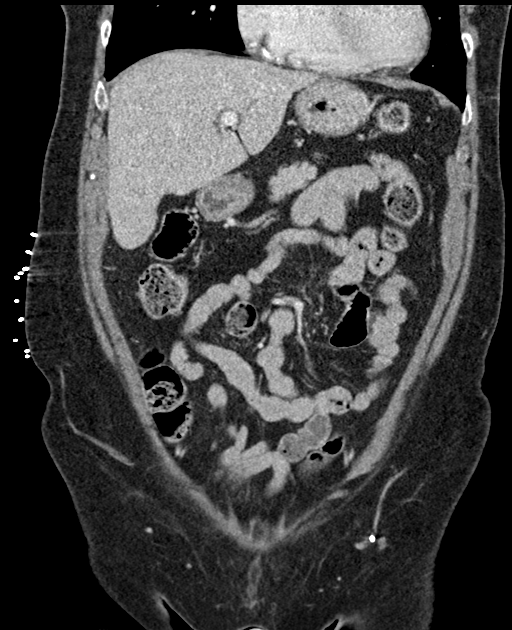
[im 59/133  soft-tissue]
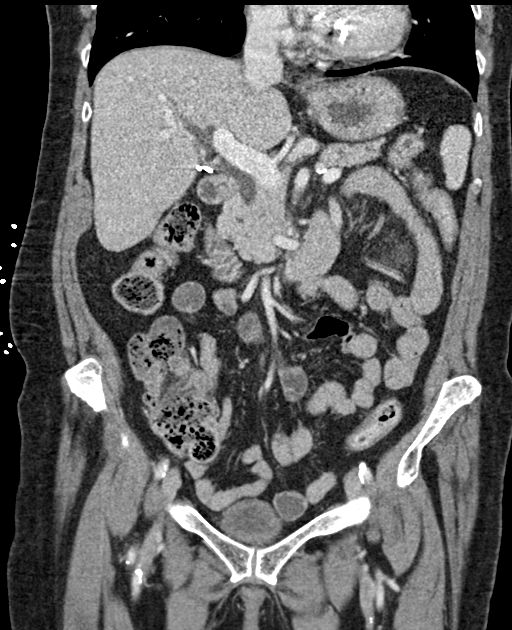
[im 74/133  soft-tissue]
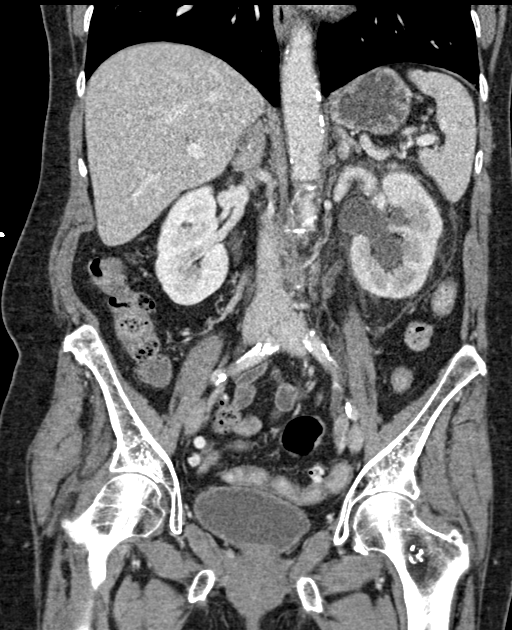

[16 of 46 positions shown; findings below may reference images not displayed]

FINDINGS: Lower chest: Stable 3 mm nodule in the right middle lobe (series 2,
image 1) compatible benign etiology. Mitral annular and coronary
artery calcific atherosclerosis.

Hepatobiliary: No focal liver abnormality is seen. Status post
cholecystectomy. No biliary dilatation.

Pancreas: Unremarkable. No pancreatic ductal dilatation or
surrounding inflammatory changes.

Spleen: Normal in size without focal abnormality.

Adrenals/Urinary Tract: 2.5 mm stone at the left ureterovesicular
junction (series 2, image 70) with moderate left-sided
hydronephrosis and perinephric stranding. Subcentimeter right kidney
interpolar cyst. No focal kidney lesion is evident. No right kidney
hydronephrosis. Normal adrenal glands. No additional urinary stone
disease identified.

Stomach/Bowel: Stomach is within normal limits. Appendix appears
normal. No evidence of bowel wall thickening, distention, or
inflammatory changes.

Vascular/Lymphatic: Infrarenal abdominal aorta measures 2.5 cm.
Aortic atherosclerosis with mixed plaque. No lymphadenopathy.

Reproductive: Calcified uterine fundal myoma measuring 16 mm. Adnexa
are unremarkable.

Other: No abdominal wall hernia or abnormality. No abdominopelvic
ascites.

Musculoskeletal: No fracture is seen. Mild levocurvature and
spondylosis of the lumbar spine with discogenic degenerative changes
greatest at L4-5 and lower lumbar facet arthrosis. Stable mild L3
superior endplate deformity, likely a chronic Schmorl's node.
IMPRESSION: 1. 2.5 mm stone at the left ureterovesicular junction with moderate
left-sided hydronephrosis and perinephric stranding.
2. Ectatic abdominal aorta at risk for aneurysm development.
Recommend followup by ultrasound in 5 years. This recommendation
follows ACR consensus guidelines: White Paper of the ACR Incidental
3. Aortic Atherosclerosis (7XC2B-8J0.0).

## 2020-02-01 ENCOUNTER — Telehealth: Payer: Self-pay | Admitting: Pharmacist

## 2020-02-01 NOTE — Progress Notes (Signed)
Chronic Care Management Pharmacy Assistant   Name: Briana Barr  MRN: [id number] DOB: 1940-10-06  Reason for Encounter: Initial Questions   PCP : [name], MD  Allergies:   Allergies  Allergen Reactions  . Vioxx [Rofecoxib] Swelling    Medications: Outpatient Encounter Medications as of 02/01/2020  Medication Sig  . acetaZOLAMIDE (DIAMOX) 250 MG tablet TAKE 1 TABLET BY MOUTH EVERY DAY (Patient taking differently: Take 250 mg by mouth daily. )  . albuterol (PROAIR HFA) 108 (90 Base) MCG/ACT inhaler Inhale 2 puffs into the lungs every 6 (six) hours as needed for wheezing or shortness of breath.  Marland Kitchen aspirin 81 MG tablet Take 81 mg by mouth daily.  . Cholecalciferol (VITAMIN D3) 250 MCG (10000 UT) capsule Take 1 capsule (10,000 Units total) by mouth daily.  . Fluticasone-Umeclidin-Vilant (TRELEGY ELLIPTA) 100-62.5-25 MCG/INH AEPB Inhale 1 Inhaler into the lungs daily.  Marland Kitchen glimepiride (AMARYL) 1 MG tablet TAKE 1 TABLET BY MOUTH EVERY DAY IN THE MORNING  . HYDROcodone-acetaminophen (NORCO) 5-325 MG tablet Take 1 tablet by mouth 4 (four) times daily.  Marland Kitchen ipratropium-albuterol (DUONEB) 0.5-2.5 (3) MG/3ML SOLN Take 3 mLs by nebulization every 6 (six) hours as needed. (Patient taking differently: Take 3 mLs by nebulization every 6 (six) hours as needed (SOB, wheezing). )  . levofloxacin (LEVAQUIN) 500 MG tablet Take 1 tablet (500 mg total) by mouth daily.  Marland Kitchen losartan (COZAAR) 50 MG tablet TAKE 1 TABLET BY MOUTH EVERY DAY  . metFORMIN (GLUCOPHAGE) 500 MG tablet TAKE 1 TABLET BY MOUTH TWICE A DAY WITH MEALS  . Multiple Vitamins-Minerals (CENTRUM SILVER 50+WOMEN) TABS Take 1 tablet by mouth daily.  Marland Kitchen omeprazole (PRILOSEC) 20 MG capsule TAKE 1 CAPSULE BY MOUTH TWICE A DAY BEFORE A MEAL  . rosuvastatin (CRESTOR) 20 MG tablet TAKE 1 TABLET BY MOUTH EVERYDAY AT BEDTIME (Patient taking differently: Take 20 mg by mouth daily after breakfast. )   No facility-administered encounter medications  on file as of 02/01/2020.    Current Diagnosis: Patient Active Problem List   Diagnosis Date Noted  . Bacteremia 11/14/2019  . Acute respiratory failure with hypoxia ([place]) 11/13/2019  . COPD (chronic obstructive pulmonary disease) ([place]) 11/13/2019  . Diabetes mellitus type 2 in nonobese ([place]) 11/13/2019  . Essential hypertension 11/13/2019  . Community acquired pneumonia of left lower lobe of lung   . Abnormal CT scan, colon   . Benign neoplasm of sigmoid colon   . Benign neoplasm of rectum   . Loss of weight   . Epigastric abdominal tenderness without rebound tenderness   . Anorexia     Goals Addressed   None    Spoke with the patient's daughter in law, [name], who is listed in the patient's chart as a legal point of contact.   Have you seen any other providers since your last visit? No, Recent ED visit 11-12-19   Any changes in your medications or health? Yes, patient is now 6L continuous of O2   Any side effects from any medications? No   Do you have an symptoms or problems not managed by your medications? No   Any concerns about your health right now? Does not want to go to the podiatrist any longer.   Has your provider asked that you check blood pressure, blood sugar, or follow special diet at home? Blood sugar and blood pressure are checked daily.   Do you get any type of exercise on a regular basis? Not much, patient has  COPD   Can you think of a goal you would like to reach for your health? None at this time   Do you have any problems getting your medications? No, but would love to be able to get medications all at once instead of making multiple trips to the pharmacy.    Is there anything that you would like to discuss during the appointment?   Reminded the patient to please have medications and supplements to appointment on Friday October 22nd at 3:30 pm  Patient's daughter in law stated is on a carbon dioxide machine at night because she is on a  high concentrated level of oxygen.  Follow-Up:  Pharmacist Review   Fanny Skates, [place] Pharmacist Assistant [phone]

## 2020-02-02 ENCOUNTER — Other Ambulatory Visit: Payer: Self-pay

## 2020-02-02 ENCOUNTER — Ambulatory Visit: Payer: Medicare Other | Admitting: Pharmacist

## 2020-02-02 DIAGNOSIS — J431 Panlobular emphysema: Secondary | ICD-10-CM

## 2020-02-02 DIAGNOSIS — I10 Essential (primary) hypertension: Secondary | ICD-10-CM

## 2020-02-02 DIAGNOSIS — E119 Type 2 diabetes mellitus without complications: Secondary | ICD-10-CM

## 2020-02-02 MED ORDER — LOSARTAN POTASSIUM 50 MG PO TABS: 50.0000 mg | ORAL_TABLET | Freq: Every day | ORAL | 3 refills | Status: DC

## 2020-02-02 MED ORDER — ROSUVASTATIN CALCIUM 20 MG PO TABS: ORAL_TABLET | ORAL | 1 refills | Status: DC

## 2020-02-02 MED ORDER — OMEPRAZOLE 20 MG PO CPDR: DELAYED_RELEASE_CAPSULE | ORAL | 3 refills | Status: DC

## 2020-02-02 MED ORDER — TRELEGY ELLIPTA 100-62.5-25 MCG/INH IN AEPB: 1.0000 | INHALATION_SPRAY | Freq: Every day | RESPIRATORY_TRACT | 11 refills | Status: DC

## 2020-02-02 MED ORDER — METFORMIN HCL 500 MG PO TABS: 500.0000 mg | ORAL_TABLET | Freq: Two times a day (BID) | ORAL | 1 refills | Status: DC

## 2020-02-02 NOTE — Patient Instructions (Addendum)
Visit Information Thank you for meeting with me today!  I look forward to working with you to help you meet all of your healthcare goals and answer any questions you may have.  Feel free to contact me anytime!  Goals Addressed            This Visit's Progress   . Pharmacy Care Plan:       CARE PLAN ENTRY (see longitudinal plan of care for additional care plan information)  Current Barriers:  . Chronic Disease Management support, education, and care coordination needs related to Hypertension, Diabetes, and COPD   Hypertension BP Readings from Last 3 Encounters:  12/01/19 (!) 144/68  11/24/19 110/60  11/20/19 102/67   . Pharmacist Clinical Goal(s): o Over the next 90 days, patient will work with PharmD and providers to maintain BP goal <140/90 . Current regimen:  o Losartan 50mg  . Interventions: o Reviewed home blood pressure monitoring o Discussed medication adherence . Patient self care activities - Over the next 90 days, patient will: o Check BP daily, document, and provide at future appointments o Ensure daily salt intake < 2300 mg/day  Diabetes Lab Results  Component Value Date/Time   HGBA1C 7.0 (H) 12/01/2019 12:41 PM   HGBA1C 6.8 (H) 11/13/2019 05:00 PM   . Pharmacist Clinical Goal(s): o Over the next 90 days, patient will work with PharmD and providers to maintain A1c goal <8% . Current regimen:  o Metformin 500mg  bid . Interventions: o Discussed risk of hypoglycemia with advancing age and comorbidities o Reviewed home blood sugar readings o Discussed medication adherence . Patient self care activities - Over the next 90 days, patient will: o Check blood sugar once daily, document, and provide at future appointments o Contact provider with any episodes of hypoglycemia  COPD . Pharmacist Clinical Goal(s) o Over the next 90 days, patient will work with PharmD and providers to optimize medication and minimize symptoms of COPD . Current regimen:   Trelegy  Ellipta 100-62.5-25 mcg daily  Albuterol HFA 90 mcg prn  Duoneb 0.5-2.5 mg/ml prn . Interventions: o Reviewed frequency of inhaler use o Discussed episodes of SOB o Counseled on proper inhaler use . Patient self care activities - Over the next 90 days, patient will: o Continue to take medications as directed o Contact providers with any new or worsening SOB  Medication management . Pharmacist Clinical Goal(s): o Over the next 90 days, patient will work with PharmD and providers to maintain optimal medication adherence . [facility] . Interventions o Comprehensive medication review performed. o Utilize [facility] for medication synchronization, packaging and delivery . Patient self care activities - Over the next 90 days, patient will: o Focus on medication adherence by switching to pill packs from [facility] o Take medications as prescribed o Report any questions or concerns to PharmD and/or provider(s)  Initial goal documentation        Ms. Bouvier was given information about Chronic Care Management services today including:  1. CCM service includes personalized support from designated clinical staff supervised by her physician, including individualized plan of care and coordination with other care providers 2. 24/7 contact phone numbers for assistance for urgent and routine care needs. 3. Standard insurance, coinsurance, copays and deductibles apply for chronic care management only during months in which we provide at least 20 minutes of these services. Most insurances cover these services at 100%, however patients may be responsible for any copay, coinsurance and/or deductible if applicable. This service may  help you avoid the need for more expensive face-to-face services. 4. Only one practitioner may furnish and bill the service in a calendar month. 5. The patient may stop CCM services at any time (effective at the end of the month) by phone call to the office  staff.  Patient agreed to services and verbal consent obtained.   The patient verbalized understanding of instructions provided today and agreed to receive a mailed copy of patient instruction and/or educational materials. Telephone follow up appointment with pharmacy team member scheduled for: 53 months  Miski Feldpausch, PharmD Clinical Pharmacist [facility] [phone]   Diabetes Mellitus and Nutrition, Adult When you have diabetes (diabetes mellitus), it is very important to have healthy eating habits because your blood sugar (glucose) levels are greatly affected by what you eat and drink. Eating healthy foods in the appropriate amounts, at about the same times every day, can help you:  Control your blood glucose.  Lower your risk of heart disease.  Improve your blood pressure.  Reach or maintain a healthy weight. Every person with diabetes is different, and each person has different needs for a meal plan. Your health care provider may recommend that you work with a diet and nutrition specialist (dietitian) to make a meal plan that is best for you. Your meal plan may vary depending on factors such as:  The calories you need.  The medicines you take.  Your weight.  Your blood glucose, blood pressure, and cholesterol levels.  Your activity level.  Other health conditions you have, such as heart or kidney disease. How do carbohydrates affect me? Carbohydrates, also called carbs, affect your blood glucose level more than any other type of food. Eating carbs naturally raises the amount of glucose in your blood. Carb counting is a method for keeping track of how many carbs you eat. Counting carbs is important to keep your blood glucose at a healthy level, especially if you use insulin or take certain oral diabetes medicines. It is important to know how many carbs you can safely have in each meal. This is different for every person. Your dietitian can help you  calculate how many carbs you should have at each meal and for each snack. Foods that contain carbs include:  Bread, cereal, rice, pasta, and crackers.  Potatoes and corn.  Peas, beans, and lentils.  Milk and yogurt.  Fruit and juice.  Desserts, such as cakes, cookies, ice cream, and candy. How does alcohol affect me? Alcohol can cause a sudden decrease in blood glucose (hypoglycemia), especially if you use insulin or take certain oral diabetes medicines. Hypoglycemia can be a life-threatening condition. Symptoms of hypoglycemia (sleepiness, dizziness, and confusion) are similar to symptoms of having too much alcohol. If your health care provider says that alcohol is safe for you, follow these guidelines:  Limit alcohol intake to no more than 1 drink per day for nonpregnant women and 2 drinks per day for men. One drink equals 12 oz of beer, 5 oz of wine, or 1 oz of hard liquor.  Do not drink on an empty stomach.  Keep yourself hydrated with water, diet soda, or unsweetened iced tea.  Keep in mind that regular soda, juice, and other mixers may contain a lot of sugar and must be counted as carbs. What are tips for following this plan?  Reading food labels  Start by checking the serving size on the "Nutrition Facts" label of packaged foods and drinks. The amount of calories,  carbs, fats, and other nutrients listed on the label is based on one serving of the item. Many items contain more than one serving per package.  Check the total grams (g) of carbs in one serving. You can calculate the number of servings of carbs in one serving by dividing the total carbs by 15. For example, if a food has 30 g of total carbs, it would be equal to 2 servings of carbs.  Check the number of grams (g) of saturated and trans fats in one serving. Choose foods that have low or no amount of these fats.  Check the number of milligrams (mg) of salt (sodium) in one serving. Most people should limit total  sodium intake to less than 2,300 mg per day.  Always check the nutrition information of foods labeled as "low-fat" or "nonfat". These foods may be higher in added sugar or refined carbs and should be avoided.  Talk to your dietitian to identify your daily goals for nutrients listed on the label. Shopping  Avoid buying canned, premade, or processed foods. These foods tend to be high in fat, sodium, and added sugar.  Shop around the outside edge of the grocery store. This includes fresh fruits and vegetables, bulk grains, fresh meats, and fresh dairy. Cooking  Use low-heat cooking methods, such as baking, instead of high-heat cooking methods like deep frying.  Cook using healthy oils, such as olive, canola, or sunflower oil.  Avoid cooking with butter, cream, or high-fat meats. Meal planning  Eat meals and snacks regularly, preferably at the same times every day. Avoid going long periods of time without eating.  Eat foods high in fiber, such as fresh fruits, vegetables, beans, and whole grains. Talk to your dietitian about how many servings of carbs you can eat at each meal.  Eat 4-6 ounces (oz) of lean protein each day, such as lean meat, chicken, fish, eggs, or tofu. One oz of lean protein is equal to: ? 1 oz of meat, chicken, or fish. ? 1 egg. ?  cup of tofu.  Eat some foods each day that contain healthy fats, such as avocado, nuts, seeds, and fish. Lifestyle  Check your blood glucose regularly.  Exercise regularly as told by your health care provider. This may include: ? 150 minutes of moderate-intensity or vigorous-intensity exercise each week. This could be brisk walking, biking, or water aerobics. ? Stretching and doing strength exercises, such as yoga or weightlifting, at least 2 times a week.  Take medicines as told by your health care provider.  Do not use any products that contain nicotine or tobacco, such as cigarettes and e-cigarettes. If you need help quitting, ask  your health care provider.  Work with a Social worker or diabetes educator to identify strategies to manage stress and any emotional and social challenges. Questions to ask a health care provider  Do I need to meet with a diabetes educator?  Do I need to meet with a dietitian?  What number can I call if I have questions?  When are the best times to check my blood glucose? Where to find more information:  American Diabetes Association: diabetes.org  Academy of Nutrition and Dietetics: www.eatright.[employer] of Diabetes and Digestive and Kidney Diseases (NIH): [employer] Summary  A healthy meal plan will help you control your blood glucose and maintain a healthy lifestyle.  Working with a diet and nutrition specialist (dietitian) can help you make a meal plan that is best for you.  Keep  in mind that carbohydrates (carbs) and alcohol have immediate effects on your blood glucose levels. It is important to count carbs and to use alcohol carefully. This information is not intended to replace advice given to you by your health care provider. Make sure you discuss any questions you have with your health care provider. Document Revised: 03/12/2017 Document Reviewed: 05/04/2016 Elsevier Patient Education  2020 [employer].

## 2020-02-02 NOTE — Chronic Care Management (AMB) (Addendum)
Chronic Care Management Pharmacy  Name: Briana Barr  MRN: [id number] DOB: [birth year]  Chief Complaint/ HPI  Briana Barr,  79 y.o. , female presents for their Initial CCM visit with the clinical pharmacist via telephone.  PCP : [name], MD  Their chronic conditions include: HTN, COPD, Type II DM.  Office Visits: 12/01/2019 Dennard Schaumann) - Patient has severe end stage oxygen dependent COPD.  She was given a life expectancy of 6 months or less.  Consult Visit: 12/13/2019 (Palliative Care) - her goal is to remain independent and in her home, she reports a good appetite and her son prepares her meals.  Family checks blood pressure and sugars daily.  Medications: Outpatient Encounter Medications as of 02/02/2020  Medication Sig   acetaZOLAMIDE (DIAMOX) 250 MG tablet TAKE 1 TABLET BY MOUTH EVERY DAY (Patient taking differently: Take 250 mg by mouth daily. )   albuterol (PROAIR HFA) 108 (90 Base) MCG/ACT inhaler Inhale 2 puffs into the lungs every 6 (six) hours as needed for wheezing or shortness of breath.   aspirin 81 MG tablet Take 81 mg by mouth daily.   Cholecalciferol (VITAMIN D3) 250 MCG (10000 UT) capsule Take 1 capsule (10,000 Units total) by mouth daily.   Fluticasone-Umeclidin-Vilant (TRELEGY ELLIPTA) 100-62.5-25 MCG/INH AEPB Inhale 1 Inhaler into the lungs daily.   HYDROcodone-acetaminophen (NORCO) 5-325 MG tablet Take 1 tablet by mouth 4 (four) times daily.   ipratropium-albuterol (DUONEB) 0.5-2.5 (3) MG/3ML SOLN Take 3 mLs by nebulization every 6 (six) hours as needed. (Patient taking differently: Take 3 mLs by nebulization every 6 (six) hours as needed (SOB, wheezing). )   losartan (COZAAR) 50 MG tablet TAKE 1 TABLET BY MOUTH EVERY DAY   metFORMIN (GLUCOPHAGE) 500 MG tablet TAKE 1 TABLET BY MOUTH TWICE A DAY WITH MEALS   Multiple Vitamins-Minerals (CENTRUM SILVER 50+WOMEN) TABS Take 1 tablet by mouth daily.   omeprazole (PRILOSEC) 20 MG capsule TAKE 1 CAPSULE BY MOUTH  TWICE A DAY BEFORE A MEAL   rosuvastatin (CRESTOR) 20 MG tablet TAKE 1 TABLET BY MOUTH EVERYDAY AT BEDTIME (Patient taking differently: Take 20 mg by mouth daily after breakfast. )   glimepiride (AMARYL) 1 MG tablet TAKE 1 TABLET BY MOUTH EVERY DAY IN THE MORNING (Patient not taking: Reported on 02/02/2020)   levofloxacin (LEVAQUIN) 500 MG tablet Take 1 tablet (500 mg total) by mouth daily. (Patient not taking: Reported on 02/02/2020)   [DISCONTINUED] losartan (COZAAR) 50 MG tablet Take 1 tablet (50 mg total) by mouth daily.   [DISCONTINUED] omeprazole (PRILOSEC) 20 MG capsule TAKE 1 CAPSULE BY MOUTH TWICE A DAY BEFORE A MEAL   No facility-administered encounter medications on file as of 02/02/2020.     Current Diagnosis/Assessment:  Goals Addressed             This Visit's Progress    Pharmacy Care Plan:       CARE PLAN ENTRY (see longitudinal plan of care for additional care plan information)  Current Barriers:  Chronic Disease Management support, education, and care coordination needs related to Hypertension, Diabetes, and COPD   Hypertension BP Readings from Last 3 Encounters:  12/01/19 (!) 144/68  11/24/19 110/60  11/20/19 102/67   Pharmacist Clinical Goal(s): Over the next 90 days, patient will work with PharmD and providers to maintain BP goal <140/90 Current regimen:  Losartan 50mg  Interventions: Reviewed home blood pressure monitoring Discussed medication adherence Patient self care activities - Over the next 90 days, patient will: Check BP daily, document,  and provide at future appointments Ensure daily salt intake < 2300 mg/day  Diabetes Lab Results  Component Value Date/Time   HGBA1C 7.0 (H) 12/01/2019 12:41 PM   HGBA1C 6.8 (H) 11/13/2019 05:00 PM   Pharmacist Clinical Goal(s): Over the next 90 days, patient will work with PharmD and providers to maintain A1c goal <8% Current regimen:  Metformin 500mg  bid Interventions: Discussed risk of hypoglycemia  with advancing age and comorbidities Reviewed home blood sugar readings Discussed medication adherence Patient self care activities - Over the next 90 days, patient will: Check blood sugar once daily, document, and provide at future appointments Contact provider with any episodes of hypoglycemia  COPD Pharmacist Clinical Goal(s) Over the next 90 days, patient will work with PharmD and providers to optimize medication and minimize symptoms of COPD Current regimen:  Trelegy Ellipta 100-62.5-25 mcg daily Albuterol HFA 90 mcg prn Duoneb 0.5-2.5 mg/ml prn Interventions: Reviewed frequency of inhaler use Discussed episodes of SOB Counseled on proper inhaler use Patient self care activities - Over the next 90 days, patient will: Continue to take medications as directed Contact providers with any new or worsening SOB  Medication management Pharmacist Clinical Goal(s): Over the next 90 days, patient will work with PharmD and providers to maintain optimal medication adherence [facility] Interventions Comprehensive medication review performed. Utilize [facility] for medication synchronization, packaging and delivery Patient self care activities - Over the next 90 days, patient will: Focus on medication adherence by switching to pill packs from [facility] Take medications as prescribed Report any questions or concerns to PharmD and/or provider(s)  Initial goal documentation         Hypertension   BP goal is:  <140/90  Office blood pressures are  BP Readings from Last 3 Encounters:  12/13/19 104/69  12/01/19 (!) 144/68  11/24/19 110/60   Patient checks BP at home daily Patient home BP readings are ranging: no logs available  Patient has failed these meds in the past: none noted Patient is currently controlled on the following medications:  Losartan 50mg   We discussed  BP has been controlled at home and with hospice caretakers Takes medication each  morning Denies headaches and dizziness  Plan  Continue current medications     Diabetes   A1c goal <8%  Recent Relevant Labs: Lab Results  Component Value Date/Time   HGBA1C 7.0 (H) 12/01/2019 12:41 PM   HGBA1C 6.8 (H) 11/13/2019 05:00 PM   MICROALBUR 1.7 01/12/2019 10:17 AM   MICROALBUR 1.2 12/07/2017 09:39 AM    Last diabetic Eye exam:  Lab Results  Component Value Date/Time   HMDIABEYEEXA No Retinopathy 12/30/2017 12:00 AM    Last diabetic Foot exam: No results found for: HMDIABFOOTEX   Checking BG: Daily  Recent FBG Readings: 148-200 was the range for this wee per patient's daughter in law  Patient has failed these meds in past: none noted Patient is currently controlled on the following medications: Metformin 500mg  twice daily  We discussed:  Due to her prognosis, glimepiride was d/c to prevent risk of hypoglycemia which I think was a great idea No just on metformin and reports no episodes of hypoglycemia Admits there has been some snacking on Halloween candy lately leading to the readings up around 200. She is walking as much as she can and keeping a close eye on blood sugar every day.  Plan  Continue current medications  COPD    Eosinophil count:   Lab Results  Component Value Date/Time  EOSPCT 2.6 12/01/2019 12:41 PM  %                               Eos (Absolute):  Lab Results  Component Value Date/Time   EOSABS 198 12/01/2019 12:41 PM    Tobacco Status:  Social History   Tobacco Use  Smoking Status Current Every Day Smoker   Packs/day: 1.00   Types: Cigarettes  Smokeless Tobacco Never Used  Tobacco Comment   started age 39    Patient has failed these meds in past: none noted Patient is currently controlled on the following medications:  Trelegy Ellipta 100-62.5-25 mcg daily Albuterol HFA 90 mcg prn Duoneb 0.5-2.5 mg/ml prn Using maintenance inhaler regularly? Yes Frequency of rescue inhaler use:  1-2x per week  We discussed:    Using maintenance inhaler daily and rarely having to use Duonebs or albuterol inhaler Daughter in law has noticed a great improvement since return home from the hospital and completion of antibiotics Trelegy is affordable, patient on SSI so may qualify for PAP next year if cost becomes an issue   Plan  Continue current medications  Vaccines   Reviewed and discussed patient's vaccination history.    Immunization History  Administered Date(s) Administered   Fluad Quad(high Dose 65+) 01/12/2019   Influenza,inj,Quad PF,6+ Mos 03/02/2016, 12/07/2017   Pneumococcal Conjugate-13 04/13/2014   Pneumococcal Polysaccharide-23 12/07/2017   Plan  Recommended patient receive flu vaccine in office.   Medication Management   Miscellaneous medications:  Acetazolamide 250mg  daily Omeprazole 20mg  bid Excitalopram 10mg  Rosuvastatin 20mg  OTC's:  MVI ASA 81mg  Vitamin D 1000 IU [facility]  Patient reports using pill box method sometimes, to organize medications and promote adherence. Patient denies missed doses of medication.  Verbal consent obtained for [facility] enhanced pharmacy services (medication synchronization, adherence packaging, delivery coordination). A medication sync plan was created to allow patient to get all medications delivered once every 30 to 90 days per patient preference. Patient understands they have freedom to choose pharmacy and clinical pharmacist will coordinate care between all prescribers and [facility].  Beverly Milch, PharmD Clinical Pharmacist [place] [phone]  I have collaborated with the care management provider regarding care management and care coordination activities outlined in this encounter and have reviewed this encounter including documentation in the note and care plan. I am certifying that I agree with the content of this note and encounter as supervising physician.

## 2020-02-06 ENCOUNTER — Ambulatory Visit: Payer: Medicare Other | Admitting: Family Medicine

## 2020-02-11 ENCOUNTER — Other Ambulatory Visit: Payer: Self-pay | Admitting: Family Medicine

## 2020-02-12 ENCOUNTER — Other Ambulatory Visit: Payer: Self-pay

## 2020-02-12 MED ORDER — ROSUVASTATIN CALCIUM 20 MG PO TABS: ORAL_TABLET | ORAL | 1 refills | Status: DC

## 2020-02-13 ENCOUNTER — Other Ambulatory Visit: Payer: Self-pay | Admitting: *Deleted

## 2020-02-13 DIAGNOSIS — M810 Age-related osteoporosis without current pathological fracture: Secondary | ICD-10-CM

## 2020-02-13 DIAGNOSIS — E119 Type 2 diabetes mellitus without complications: Secondary | ICD-10-CM

## 2020-02-13 DIAGNOSIS — F0150 Vascular dementia without behavioral disturbance: Secondary | ICD-10-CM

## 2020-02-13 DIAGNOSIS — I10 Essential (primary) hypertension: Secondary | ICD-10-CM

## 2020-02-13 DIAGNOSIS — J431 Panlobular emphysema: Secondary | ICD-10-CM

## 2020-02-13 DIAGNOSIS — E7800 Pure hypercholesterolemia, unspecified: Secondary | ICD-10-CM

## 2020-02-13 DIAGNOSIS — J441 Chronic obstructive pulmonary disease with (acute) exacerbation: Secondary | ICD-10-CM

## 2020-02-14 DIAGNOSIS — E1151 Type 2 diabetes mellitus with diabetic peripheral angiopathy without gangrene: Secondary | ICD-10-CM | POA: Diagnosis not present

## 2020-02-14 DIAGNOSIS — E785 Hyperlipidemia, unspecified: Secondary | ICD-10-CM | POA: Diagnosis not present

## 2020-02-14 DIAGNOSIS — J9621 Acute and chronic respiratory failure with hypoxia: Secondary | ICD-10-CM | POA: Diagnosis not present

## 2020-02-14 DIAGNOSIS — J439 Emphysema, unspecified: Secondary | ICD-10-CM | POA: Diagnosis not present

## 2020-02-14 DIAGNOSIS — I10 Essential (primary) hypertension: Secondary | ICD-10-CM | POA: Diagnosis not present

## 2020-02-14 DIAGNOSIS — F0150 Vascular dementia without behavioral disturbance: Secondary | ICD-10-CM | POA: Diagnosis not present

## 2020-02-15 ENCOUNTER — Other Ambulatory Visit: Payer: Self-pay | Admitting: Family Medicine

## 2020-02-20 ENCOUNTER — Other Ambulatory Visit: Payer: Self-pay

## 2020-02-20 ENCOUNTER — Telehealth: Payer: Self-pay

## 2020-02-20 NOTE — Telephone Encounter (Signed)
[facility] to [facility] 2 medications [facility] to [facility].

## 2020-02-20 NOTE — Telephone Encounter (Signed)
-----   Message from Edythe Clarity, [facility] sent at 02/20/2020 10:36 AM EST ----- Briana Barr has requested a refill on her Lexapro 10mg  and Acetazolamide 250mg  to be sent to [facility] for her upcoming delivery.  Thanks!

## 2020-02-29 ENCOUNTER — Other Ambulatory Visit: Payer: Self-pay

## 2020-02-29 MED ORDER — HYDROCODONE-ACETAMINOPHEN 5-325 MG PO TABS: 1.0000 | ORAL_TABLET | Freq: Four times a day (QID) | ORAL | 0 refills | Status: DC

## 2020-02-29 MED ORDER — TRELEGY ELLIPTA 100-62.5-25 MCG/INH IN AEPB: 1.0000 | INHALATION_SPRAY | Freq: Every day | RESPIRATORY_TRACT | 11 refills | Status: DC

## 2020-03-04 ENCOUNTER — Ambulatory Visit: Payer: Medicare Other | Admitting: Podiatry

## 2020-03-11 ENCOUNTER — Other Ambulatory Visit: Payer: Medicare Other | Admitting: *Deleted

## 2020-03-11 ENCOUNTER — Other Ambulatory Visit: Payer: Self-pay

## 2020-03-11 ENCOUNTER — Ambulatory Visit (INDEPENDENT_AMBULATORY_CARE_PROVIDER_SITE_OTHER): Payer: Medicare Other | Admitting: Family Medicine

## 2020-03-11 VITALS — BP 120/70 | HR 93 | Temp 97.9°F | Ht 64.0 in | Wt 148.0 lb

## 2020-03-11 DIAGNOSIS — R1011 Right upper quadrant pain: Secondary | ICD-10-CM | POA: Diagnosis not present

## 2020-03-11 DIAGNOSIS — R0602 Shortness of breath: Secondary | ICD-10-CM

## 2020-03-11 DIAGNOSIS — Z515 Encounter for palliative care: Secondary | ICD-10-CM

## 2020-03-11 DIAGNOSIS — M79671 Pain in right foot: Secondary | ICD-10-CM | POA: Diagnosis not present

## 2020-03-11 NOTE — Progress Notes (Signed)
Subjective:    Patient ID: Briana Barr, female    DOB: Oct 07, 1940, 79 y.o.   MRN: [id number]  Patient presents today with 2 separate concerns.  First she fell recently and injured her right foot.  She is extremely tender to palpation over the plantar aspect of the distal fourth metatarsal on her right foot.  There is no visible bruising or swelling however minimal palpation in that area it reproduces pain and movement elicit significant pain.  However she is also been having intermittent right upper quadrant abdominal pain.  She is had her gallbladder removed.  She states that there is no specific trigger.  The pain comes on its own without provocation.  When it comes it can last hours and then it resolves on its own.  There is no relationship to food.  She denies any belching or nausea.  She denies any heartburn.  She does have a history of ischemic colitis however she denies any melena or hematochezia.  Food and eating does not exacerbate the pain.  It seems to come on its own unprovoked.  She reports not having frequent bowel movements and she is on chronic narcotic pain medication 1 hydrocodone a day.  Past Medical History:  Diagnosis Date  . Barrett esophagus   . Colon polyps    benign  . COPD (chronic obstructive pulmonary disease) ([place])   . Duodenal ulcer   . DVT (deep venous thrombosis) ([facility])    left leg x 2  . Gallstones   . GERD (gastroesophageal reflux disease)   . Glaucoma   . Hyperlipidemia   . Ischemic colitis ([place])   . Osteoporosis   . Pneumonia    hx of x 2 as a child   . Renal cyst, right   . Retinopathy, background, proliferative    Past Surgical History:  Procedure Laterality Date  . cervical vertebral fusion     c5-6, 6-7  . CHOLECYSTECTOMY    . COLONOSCOPY WITH PROPOFOL N/A 11/23/2016   Procedure: COLONOSCOPY WITH PROPOFOL;  Surgeon: Ladene Artist, MD;  Location: [facility];  Service: Endoscopy;  Laterality: N/A;  . ESOPHAGOGASTRODUODENOSCOPY (EGD) WITH  PROPOFOL N/A 11/23/2016   Procedure: ESOPHAGOGASTRODUODENOSCOPY (EGD) WITH PROPOFOL;  Surgeon: Ladene Artist, MD;  Location: [facility];  Service: Endoscopy;  Laterality: N/A;  . FEMORAL-POPLITEAL BYPASS GRAFT    . IR RADIOLOGIST EVAL & MGMT  06/06/2019    Current Outpatient Medications on File Prior to Visit  Medication Sig Dispense Refill  . acetaZOLAMIDE (DIAMOX) 250 MG tablet TAKE 1 TABLET BY MOUTH EVERY DAY 90 tablet 1  . albuterol (PROAIR HFA) 108 (90 Base) MCG/ACT inhaler Inhale 2 puffs into the lungs every 6 (six) hours as needed for wheezing or shortness of breath. 18 g 1  . aspirin 81 MG tablet Take 81 mg by mouth daily.    . Cholecalciferol (VITAMIN D3) 250 MCG (10000 UT) capsule Take 1 capsule (10,000 Units total) by mouth daily. 30 capsule   . escitalopram (LEXAPRO) 10 MG tablet TAKE 1 TABLET BY MOUTH EVERY DAY 90 tablet 3  . Fluticasone-Umeclidin-Vilant (TRELEGY ELLIPTA) 100-62.5-25 MCG/INH AEPB Inhale 1 Inhaler into the lungs daily. 1 each 11  . HYDROcodone-acetaminophen (NORCO) 5-325 MG tablet Take 1 tablet by mouth 4 (four) times daily. 90 tablet 0  . ipratropium-albuterol (DUONEB) 0.5-2.5 (3) MG/3ML SOLN Take 3 mLs by nebulization every 6 (six) hours as needed. (Patient taking differently: Take 3 mLs by nebulization every 6 (six) hours as needed (SOB,  wheezing). ) 360 mL 1  . losartan (COZAAR) 50 MG tablet Take 1 tablet (50 mg total) by mouth daily. 90 tablet 3  . metFORMIN (GLUCOPHAGE) 500 MG tablet Take 1 tablet (500 mg total) by mouth 2 (two) times daily with a meal. 180 tablet 1  . Multiple Vitamins-Minerals (CENTRUM SILVER 50+WOMEN) TABS Take 1 tablet by mouth daily. 90 tablet   . omeprazole (PRILOSEC) 20 MG capsule TAKE 1 CAPSULE BY MOUTH TWICE A DAY BEFORE A MEAL 180 capsule 3  . rosuvastatin (CRESTOR) 20 MG tablet TAKE 1 TABLET BY MOUTH EVERYDAY AT BEDTIME 90 tablet 1  . glimepiride (AMARYL) 1 MG tablet TAKE 1 TABLET BY MOUTH EVERY DAY IN THE MORNING (Patient not  taking: Reported on 02/02/2020) 90 tablet 1  . levofloxacin (LEVAQUIN) 500 MG tablet Take 1 tablet (500 mg total) by mouth daily. (Patient not taking: Reported on 02/02/2020) 7 tablet 0   No current facility-administered medications on file prior to visit.   Allergies  Allergen Reactions  . Vioxx [Rofecoxib] Swelling   Social History   Socioeconomic History  . Marital status: Widowed    Spouse name: Not on file  . Number of children: 3  . Years of education: Not on file  . Highest education level: Not on file  Occupational History  . Occupation: retired  Tobacco Use  . Smoking status: Current Every Day Smoker    Packs/day: 1.00    Types: Cigarettes  . Smokeless tobacco: Never Used  . Tobacco comment: started age 55  Vaping Use  . Vaping Use: Never used  Substance and Sexual Activity  . Alcohol use: No  . Drug use: No  . Sexual activity: Not on file  Other Topics Concern  . Not on file  Social History Narrative  . Not on file   Social Determinants of Health   Financial Resource Strain: Low Risk   . Difficulty of Paying Living Expenses: Not very hard  Food Insecurity:   . Worried About Charity fundraiser in the Last Year: Not on file  . Ran Out of Food in the Last Year: Not on file  Transportation Needs:   . Lack of Transportation (Medical): Not on file  . Lack of Transportation (Non-Medical): Not on file  Physical Activity:   . Days of Exercise per Week: Not on file  . Minutes of Exercise per Session: Not on file  Stress:   . Feeling of Stress : Not on file  Social Connections:   . Frequency of Communication with Friends and Family: Not on file  . Frequency of Social Gatherings with Friends and Family: Not on file  . Attends Religious Services: Not on file  . Active Member of Clubs or Organizations: Not on file  . Attends Archivist Meetings: Not on file  . Marital Status: Not on file  Intimate Partner Violence:   . Fear of Current or Ex-Partner:  Not on file  . Emotionally Abused: Not on file  . Physically Abused: Not on file  . Sexually Abused: Not on file   Family History  Problem Relation Age of Onset  . Other Father        some sort of blood cancer-had to get transfusions  . Stomach cancer Maternal Grandmother   . Stomach cancer Maternal Grandfather       Review of Systems  All other systems reviewed and are negative.      Objective:   Physical Exam Vitals reviewed.  Constitutional:      General: She is not in acute distress.    Appearance: She is well-developed. She is not diaphoretic.  HENT:     Head: Normocephalic and atraumatic.     Right Ear: External ear normal.     Left Ear: External ear normal.     Nose: Nose normal.     Mouth/Throat:     Pharynx: No oropharyngeal exudate.  Eyes:     General: No scleral icterus.       Right eye: No discharge.        Left eye: No discharge.     Conjunctiva/sclera: Conjunctivae normal.  Neck:     Thyroid: No thyromegaly.     Vascular: No JVD.  Cardiovascular:     Rate and Rhythm: Normal rate and regular rhythm.     Heart sounds: Normal heart sounds. No murmur heard.  No friction rub. No gallop.   Pulmonary:     Effort: Pulmonary effort is normal.     Breath sounds: Wheezing and rhonchi present.  Abdominal:     General: Bowel sounds are normal. There is no distension.     Palpations: Abdomen is soft. There is no mass.     Tenderness: There is no abdominal tenderness. There is no guarding or rebound.  Musculoskeletal:     Cervical back: Neck supple.     Right foot: Tenderness and bony tenderness present.       Feet:  Lymphadenopathy:     Cervical: No cervical adenopathy.  Skin:    General: Skin is warm.     Coloration: Skin is not pale.     Findings: No erythema or rash.  Neurological:     Mental Status: She is alert and oriented to person, place, and time.     Cranial Nerves: No cranial nerve deficit.     Motor: No abnormal muscle tone.      Coordination: Coordination normal.     Deep Tendon Reflexes: Reflexes are normal and symmetric.  Psychiatric:        Behavior: Behavior normal.        Thought Content: Thought content normal.        Judgment: Judgment normal.           Assessment & Plan:  Shortness of breath - Plan: DG Chest 2 View  Right upper quadrant abdominal pain - Plan: DG Abd 2 Views  Right foot pain - Plan: DG Foot Complete Right  I believe the patient will be at high risk for abdominal pain due to constipation due to her chronic narcotic use and her immobility.  Furthermore she endorses some constipation.  This can certainly cause colicky abdominal pain.  Therefore I will obtain x-rays of the abdomen to see if she has a large stool burden and if so I would recommend a laxative to see if this would help with her abdominal pain.  I will also obtain a chest x-ray as she does have right anterior rhonchorous breath sounds around her breast to rule out a possible pneumonia particular given her severe COPD.  I will also obtain an x-ray of the right foot as a suspect that she may have fractured her fourth metatarsal to determine appropriate management.  If the x-rays were negative, the next step would be a CT scan of the abdomen and pelvis to evaluate further.  Also on the differential diagnosis would be ischemic colitis.

## 2020-03-12 ENCOUNTER — Ambulatory Visit
Admission: RE | Admit: 2020-03-12 | Discharge: 2020-03-12 | Disposition: A | Discharge status: Home / Self Care | Payer: Medicare Other | Source: Ambulatory Visit | Attending: Family Medicine | Admitting: Family Medicine

## 2020-03-12 DIAGNOSIS — M79671 Pain in right foot: Secondary | ICD-10-CM

## 2020-03-12 DIAGNOSIS — R109 Unspecified abdominal pain: Secondary | ICD-10-CM | POA: Diagnosis not present

## 2020-03-12 DIAGNOSIS — R1011 Right upper quadrant pain: Secondary | ICD-10-CM

## 2020-03-12 DIAGNOSIS — R0602 Shortness of breath: Secondary | ICD-10-CM

## 2020-03-12 DIAGNOSIS — R059 Cough, unspecified: Secondary | ICD-10-CM | POA: Diagnosis not present

## 2020-03-13 ENCOUNTER — Encounter (HOSPITAL_COMMUNITY): Payer: Self-pay | Admitting: Emergency Medicine

## 2020-03-13 ENCOUNTER — Other Ambulatory Visit: Payer: Self-pay

## 2020-03-13 ENCOUNTER — Inpatient Hospital Stay (HOSPITAL_COMMUNITY)
Admission: EM | Admit: 2020-03-13 | Discharge: 2020-03-16 | DRG: 194 | Disposition: A | Discharge status: Home Health | Payer: Medicare Other | Attending: Internal Medicine | Admitting: Internal Medicine

## 2020-03-13 DIAGNOSIS — Z79899 Other long term (current) drug therapy: Secondary | ICD-10-CM

## 2020-03-13 DIAGNOSIS — Z7984 Long term (current) use of oral hypoglycemic drugs: Secondary | ICD-10-CM

## 2020-03-13 DIAGNOSIS — Z515 Encounter for palliative care: Secondary | ICD-10-CM | POA: Diagnosis not present

## 2020-03-13 DIAGNOSIS — D649 Anemia, unspecified: Secondary | ICD-10-CM | POA: Diagnosis present

## 2020-03-13 DIAGNOSIS — E876 Hypokalemia: Secondary | ICD-10-CM | POA: Diagnosis not present

## 2020-03-13 DIAGNOSIS — Z7982 Long term (current) use of aspirin: Secondary | ICD-10-CM

## 2020-03-13 DIAGNOSIS — R531 Weakness: Secondary | ICD-10-CM | POA: Diagnosis not present

## 2020-03-13 DIAGNOSIS — Z20822 Contact with and (suspected) exposure to covid-19: Secondary | ICD-10-CM | POA: Diagnosis not present

## 2020-03-13 DIAGNOSIS — Z86718 Personal history of other venous thrombosis and embolism: Secondary | ICD-10-CM

## 2020-03-13 DIAGNOSIS — H409 Unspecified glaucoma: Secondary | ICD-10-CM | POA: Diagnosis present

## 2020-03-13 DIAGNOSIS — F0390 Unspecified dementia without behavioral disturbance: Secondary | ICD-10-CM | POA: Diagnosis present

## 2020-03-13 DIAGNOSIS — J189 Pneumonia, unspecified organism: Secondary | ICD-10-CM | POA: Diagnosis not present

## 2020-03-13 DIAGNOSIS — E1165 Type 2 diabetes mellitus with hyperglycemia: Secondary | ICD-10-CM | POA: Diagnosis present

## 2020-03-13 DIAGNOSIS — Z9049 Acquired absence of other specified parts of digestive tract: Secondary | ICD-10-CM

## 2020-03-13 DIAGNOSIS — I10 Essential (primary) hypertension: Secondary | ICD-10-CM | POA: Diagnosis not present

## 2020-03-13 DIAGNOSIS — Z8719 Personal history of other diseases of the digestive system: Secondary | ICD-10-CM

## 2020-03-13 DIAGNOSIS — Z9981 Dependence on supplemental oxygen: Secondary | ICD-10-CM

## 2020-03-13 DIAGNOSIS — K219 Gastro-esophageal reflux disease without esophagitis: Secondary | ICD-10-CM | POA: Diagnosis present

## 2020-03-13 DIAGNOSIS — R269 Unspecified abnormalities of gait and mobility: Secondary | ICD-10-CM | POA: Diagnosis present

## 2020-03-13 DIAGNOSIS — F17210 Nicotine dependence, cigarettes, uncomplicated: Secondary | ICD-10-CM | POA: Diagnosis present

## 2020-03-13 DIAGNOSIS — J440 Chronic obstructive pulmonary disease with acute lower respiratory infection: Secondary | ICD-10-CM | POA: Diagnosis present

## 2020-03-13 DIAGNOSIS — E785 Hyperlipidemia, unspecified: Secondary | ICD-10-CM | POA: Diagnosis present

## 2020-03-13 DIAGNOSIS — R0602 Shortness of breath: Secondary | ICD-10-CM

## 2020-03-13 DIAGNOSIS — M810 Age-related osteoporosis without current pathological fracture: Secondary | ICD-10-CM | POA: Diagnosis present

## 2020-03-13 DIAGNOSIS — R404 Transient alteration of awareness: Secondary | ICD-10-CM | POA: Diagnosis not present

## 2020-03-13 DIAGNOSIS — Z981 Arthrodesis status: Secondary | ICD-10-CM

## 2020-03-13 DIAGNOSIS — Z888 Allergy status to other drugs, medicaments and biological substances status: Secondary | ICD-10-CM

## 2020-03-13 DIAGNOSIS — K2270 Barrett's esophagus without dysplasia: Secondary | ICD-10-CM | POA: Diagnosis not present

## 2020-03-13 DIAGNOSIS — R0902 Hypoxemia: Secondary | ICD-10-CM | POA: Diagnosis not present

## 2020-03-13 DIAGNOSIS — Z66 Do not resuscitate: Secondary | ICD-10-CM | POA: Diagnosis present

## 2020-03-13 DIAGNOSIS — D75839 Thrombocytosis, unspecified: Secondary | ICD-10-CM | POA: Diagnosis present

## 2020-03-13 DIAGNOSIS — R509 Fever, unspecified: Secondary | ICD-10-CM | POA: Diagnosis not present

## 2020-03-13 DIAGNOSIS — R0689 Other abnormalities of breathing: Secondary | ICD-10-CM | POA: Diagnosis not present

## 2020-03-13 NOTE — ED Triage Notes (Signed)
Pt arrived via EMS. Family reports a fever of 106, pt's temperature here is 101.5. pt had a rapid covid test that was negative. Pt fell this morning. Pt has Rx for levofloxacin 500mg  and took her first dose at 9:30pm. Pt has had emesis at home. Pt is oriented to person at baseline.

## 2020-03-14 ENCOUNTER — Emergency Department (HOSPITAL_COMMUNITY): Payer: Medicare Other

## 2020-03-14 DIAGNOSIS — R509 Fever, unspecified: Secondary | ICD-10-CM | POA: Diagnosis not present

## 2020-03-14 DIAGNOSIS — Z515 Encounter for palliative care: Secondary | ICD-10-CM | POA: Diagnosis not present

## 2020-03-14 DIAGNOSIS — M810 Age-related osteoporosis without current pathological fracture: Secondary | ICD-10-CM | POA: Diagnosis present

## 2020-03-14 DIAGNOSIS — I10 Essential (primary) hypertension: Secondary | ICD-10-CM | POA: Diagnosis present

## 2020-03-14 DIAGNOSIS — D72829 Elevated white blood cell count, unspecified: Secondary | ICD-10-CM | POA: Diagnosis not present

## 2020-03-14 DIAGNOSIS — J811 Chronic pulmonary edema: Secondary | ICD-10-CM | POA: Diagnosis not present

## 2020-03-14 DIAGNOSIS — H409 Unspecified glaucoma: Secondary | ICD-10-CM | POA: Diagnosis present

## 2020-03-14 DIAGNOSIS — Z66 Do not resuscitate: Secondary | ICD-10-CM | POA: Diagnosis present

## 2020-03-14 DIAGNOSIS — K2270 Barrett's esophagus without dysplasia: Secondary | ICD-10-CM | POA: Diagnosis present

## 2020-03-14 DIAGNOSIS — J189 Pneumonia, unspecified organism: Secondary | ICD-10-CM | POA: Diagnosis present

## 2020-03-14 DIAGNOSIS — E876 Hypokalemia: Secondary | ICD-10-CM | POA: Diagnosis not present

## 2020-03-14 DIAGNOSIS — D75839 Thrombocytosis, unspecified: Secondary | ICD-10-CM | POA: Diagnosis present

## 2020-03-14 DIAGNOSIS — E119 Type 2 diabetes mellitus without complications: Secondary | ICD-10-CM | POA: Diagnosis not present

## 2020-03-14 DIAGNOSIS — D649 Anemia, unspecified: Secondary | ICD-10-CM | POA: Diagnosis present

## 2020-03-14 DIAGNOSIS — Z9049 Acquired absence of other specified parts of digestive tract: Secondary | ICD-10-CM | POA: Diagnosis not present

## 2020-03-14 DIAGNOSIS — J440 Chronic obstructive pulmonary disease with acute lower respiratory infection: Secondary | ICD-10-CM | POA: Diagnosis present

## 2020-03-14 DIAGNOSIS — R269 Unspecified abnormalities of gait and mobility: Secondary | ICD-10-CM | POA: Diagnosis present

## 2020-03-14 DIAGNOSIS — Z981 Arthrodesis status: Secondary | ICD-10-CM | POA: Diagnosis not present

## 2020-03-14 DIAGNOSIS — E118 Type 2 diabetes mellitus with unspecified complications: Secondary | ICD-10-CM | POA: Diagnosis not present

## 2020-03-14 DIAGNOSIS — Z8719 Personal history of other diseases of the digestive system: Secondary | ICD-10-CM | POA: Diagnosis not present

## 2020-03-14 DIAGNOSIS — K219 Gastro-esophageal reflux disease without esophagitis: Secondary | ICD-10-CM | POA: Diagnosis present

## 2020-03-14 DIAGNOSIS — J449 Chronic obstructive pulmonary disease, unspecified: Secondary | ICD-10-CM | POA: Diagnosis not present

## 2020-03-14 DIAGNOSIS — Z79899 Other long term (current) drug therapy: Secondary | ICD-10-CM | POA: Diagnosis not present

## 2020-03-14 DIAGNOSIS — F0390 Unspecified dementia without behavioral disturbance: Secondary | ICD-10-CM | POA: Diagnosis present

## 2020-03-14 DIAGNOSIS — J9611 Chronic respiratory failure with hypoxia: Secondary | ICD-10-CM | POA: Diagnosis not present

## 2020-03-14 DIAGNOSIS — F17210 Nicotine dependence, cigarettes, uncomplicated: Secondary | ICD-10-CM | POA: Diagnosis present

## 2020-03-14 DIAGNOSIS — R531 Weakness: Secondary | ICD-10-CM | POA: Diagnosis not present

## 2020-03-14 DIAGNOSIS — E785 Hyperlipidemia, unspecified: Secondary | ICD-10-CM | POA: Diagnosis present

## 2020-03-14 DIAGNOSIS — Z9981 Dependence on supplemental oxygen: Secondary | ICD-10-CM | POA: Diagnosis not present

## 2020-03-14 DIAGNOSIS — Z20822 Contact with and (suspected) exposure to covid-19: Secondary | ICD-10-CM | POA: Diagnosis present

## 2020-03-14 DIAGNOSIS — Z86718 Personal history of other venous thrombosis and embolism: Secondary | ICD-10-CM | POA: Diagnosis not present

## 2020-03-14 DIAGNOSIS — E1165 Type 2 diabetes mellitus with hyperglycemia: Secondary | ICD-10-CM | POA: Diagnosis present

## 2020-03-14 LAB — URINALYSIS, ROUTINE W REFLEX MICROSCOPIC
Bilirubin Urine: NEGATIVE
Glucose, UA: >=500 mg/dL — AB
Hgb urine dipstick: NEGATIVE
Ketones, ur: 5 mg/dL — AB
Leukocytes,Ua: NEGATIVE
Nitrite: NEGATIVE
Protein, ur: NEGATIVE mg/dL
Specific Gravity, Urine: 1.025 (ref 1.005–1.030)
pH: 5 (ref 5.0–8.0)

## 2020-03-14 LAB — RESP PANEL BY RT-PCR (FLU A&B, COVID) ARPGX2
Influenza A by PCR: NEGATIVE
Influenza B by PCR: NEGATIVE
SARS Coronavirus 2 by RT PCR: NEGATIVE

## 2020-03-14 LAB — COMPREHENSIVE METABOLIC PANEL
ALT: 19 U/L (ref 0–44)
AST: 17 U/L (ref 15–41)
Albumin: 3.6 g/dL (ref 3.5–5.0)
Alkaline Phosphatase: 55 U/L (ref 38–126)
Anion gap: 9 (ref 5–15)
BUN: 26 mg/dL — ABNORMAL HIGH (ref 8–23)
CO2: 25 mmol/L (ref 22–32)
Calcium: 8.6 mg/dL — ABNORMAL LOW (ref 8.9–10.3)
Chloride: 102 mmol/L (ref 98–111)
Creatinine, Ser: 0.63 mg/dL (ref 0.44–1.00)
GFR, Estimated: >60 mL/min (ref >60)
Glucose, Bld: 291 mg/dL — ABNORMAL HIGH (ref 70–99)
Potassium: 3.8 mmol/L (ref 3.5–5.1)
Sodium: 136 mmol/L (ref 135–145)
Total Bilirubin: 0.4 mg/dL (ref 0.3–1.2)
Total Protein: 7 g/dL (ref 6.5–8.1)

## 2020-03-14 LAB — CBC
HCT: 36.4 % (ref 36.0–46.0)
Hemoglobin: 11 g/dL — ABNORMAL LOW (ref 12.0–15.0)
MCH: 29.9 pg (ref 26.0–34.0)
MCHC: 30.2 g/dL (ref 30.0–36.0)
MCV: 98.9 fL (ref 80.0–100.0)
Platelets: 390 10*3/uL (ref 150–400)
RBC: 3.68 MIL/uL — ABNORMAL LOW (ref 3.87–5.11)
RDW: 13.2 % (ref 11.5–15.5)
WBC: 14.8 10*3/uL — ABNORMAL HIGH (ref 4.0–10.5)
nRBC: 0 % (ref 0.0–0.2)

## 2020-03-14 LAB — CBC WITH DIFFERENTIAL/PLATELET
Abs Immature Granulocytes: 0.24 10*3/uL — ABNORMAL HIGH (ref 0.00–0.07)
Basophils Absolute: 0.1 10*3/uL (ref 0.0–0.1)
Basophils Relative: 1 %
Eosinophils Absolute: 0.1 10*3/uL (ref 0.0–0.5)
Eosinophils Relative: 1 %
HCT: 37.8 % (ref 36.0–46.0)
Hemoglobin: 11.5 g/dL — ABNORMAL LOW (ref 12.0–15.0)
Immature Granulocytes: 1 %
Lymphocytes Relative: 3 %
Lymphs Abs: 0.5 10*3/uL — ABNORMAL LOW (ref 0.7–4.0)
MCH: 30 pg (ref 26.0–34.0)
MCHC: 30.4 g/dL (ref 30.0–36.0)
MCV: 98.7 fL (ref 80.0–100.0)
Monocytes Absolute: 1.5 10*3/uL — ABNORMAL HIGH (ref 0.1–1.0)
Monocytes Relative: 9 %
Neutro Abs: 14.5 10*3/uL — ABNORMAL HIGH (ref 1.7–7.7)
Neutrophils Relative %: 85 %
Platelets: 461 10*3/uL — ABNORMAL HIGH (ref 150–400)
RBC: 3.83 MIL/uL — ABNORMAL LOW (ref 3.87–5.11)
RDW: 13.3 % (ref 11.5–15.5)
WBC: 17 10*3/uL — ABNORMAL HIGH (ref 4.0–10.5)
nRBC: 0.1 % (ref 0.0–0.2)

## 2020-03-14 LAB — CREATININE, SERUM
Creatinine, Ser: 0.53 mg/dL (ref 0.44–1.00)
GFR, Estimated: >60 mL/min (ref >60)

## 2020-03-14 LAB — APTT: aPTT: 37 seconds — ABNORMAL HIGH (ref 24–36)

## 2020-03-14 LAB — LACTIC ACID, PLASMA: Lactic Acid, Venous: 1.6 mmol/L (ref 0.5–1.9)

## 2020-03-14 LAB — GLUCOSE, CAPILLARY
Glucose-Capillary: 207 mg/dL — ABNORMAL HIGH (ref 70–99)
Glucose-Capillary: 302 mg/dL — ABNORMAL HIGH (ref 70–99)

## 2020-03-14 LAB — PROTIME-INR
INR: 1.1 (ref 0.8–1.2)
Prothrombin Time: 13.4 seconds (ref 11.4–15.2)

## 2020-03-14 LAB — HEMOGLOBIN A1C
Hgb A1c MFr Bld: 8.7 % — ABNORMAL HIGH (ref 4.8–5.6)
Mean Plasma Glucose: 202.99 mg/dL

## 2020-03-14 MED ORDER — LACTATED RINGERS IV BOLUS (SEPSIS)
250.0000 mL | Freq: Once | INTRAVENOUS | Status: AC
  Administered 2020-03-14: 250 mL via INTRAVENOUS

## 2020-03-14 MED ORDER — ESCITALOPRAM OXALATE 10 MG PO TABS
10.0000 mg | ORAL_TABLET | Freq: Every day | ORAL | Status: DC
  Administered 2020-03-14 – 2020-03-16 (×3): 10 mg via ORAL
  Filled 2020-03-14 (×3): qty 1

## 2020-03-14 MED ORDER — LACTATED RINGERS IV BOLUS (SEPSIS)
1000.0000 mL | Freq: Once | INTRAVENOUS | Status: AC
  Administered 2020-03-14: 1000 mL via INTRAVENOUS

## 2020-03-14 MED ORDER — ONDANSETRON HCL 4 MG PO TABS: 4.0000 mg | ORAL_TABLET | Freq: Four times a day (QID) | ORAL | Status: DC | PRN

## 2020-03-14 MED ORDER — ENOXAPARIN SODIUM 40 MG/0.4ML ~~LOC~~ SOLN
40.0000 mg | SUBCUTANEOUS | Status: DC
  Administered 2020-03-14 – 2020-03-15 (×2): 40 mg via SUBCUTANEOUS
  Filled 2020-03-14 (×2): qty 0.4

## 2020-03-14 MED ORDER — ACETAMINOPHEN 650 MG RE SUPP: 650.0000 mg | Freq: Four times a day (QID) | RECTAL | Status: DC | PRN

## 2020-03-14 MED ORDER — UMECLIDINIUM BROMIDE 62.5 MCG/INH IN AEPB: 1.0000 | INHALATION_SPRAY | Freq: Every day | RESPIRATORY_TRACT | Status: DC

## 2020-03-14 MED ORDER — SODIUM CHLORIDE 0.9 % IV SOLN
2.0000 g | Freq: Once | INTRAVENOUS | Status: AC
  Administered 2020-03-14: 2 g via INTRAVENOUS
  Filled 2020-03-14: qty 2

## 2020-03-14 MED ORDER — ACETAZOLAMIDE 250 MG PO TABS
250.0000 mg | ORAL_TABLET | Freq: Every day | ORAL | Status: DC
  Administered 2020-03-14 – 2020-03-16 (×3): 250 mg via ORAL
  Filled 2020-03-14 (×3): qty 1

## 2020-03-14 MED ORDER — SODIUM CHLORIDE 0.9 % IV SOLN
500.0000 mg | INTRAVENOUS | Status: DC
  Administered 2020-03-14 – 2020-03-15 (×2): 500 mg via INTRAVENOUS
  Filled 2020-03-14 (×3): qty 500

## 2020-03-14 MED ORDER — VITAMIN D 25 MCG (1000 UNIT) PO TABS
10000.0000 [IU] | ORAL_TABLET | Freq: Every day | ORAL | Status: DC
  Administered 2020-03-14 – 2020-03-16 (×3): 10000 [IU] via ORAL
  Filled 2020-03-14 (×3): qty 10

## 2020-03-14 MED ORDER — INSULIN ASPART 100 UNIT/ML ~~LOC~~ SOLN
4.0000 [IU] | Freq: Once | SUBCUTANEOUS | Status: AC
  Administered 2020-03-14: 4 [IU] via SUBCUTANEOUS

## 2020-03-14 MED ORDER — ASPIRIN EC 81 MG PO TBEC
81.0000 mg | DELAYED_RELEASE_TABLET | Freq: Every day | ORAL | Status: DC
  Administered 2020-03-14 – 2020-03-16 (×3): 81 mg via ORAL
  Filled 2020-03-14 (×3): qty 1

## 2020-03-14 MED ORDER — LOSARTAN POTASSIUM 50 MG PO TABS
50.0000 mg | ORAL_TABLET | Freq: Every day | ORAL | Status: DC
  Administered 2020-03-14 – 2020-03-16 (×3): 50 mg via ORAL
  Filled 2020-03-14 (×3): qty 1

## 2020-03-14 MED ORDER — FLUTICASONE-UMECLIDIN-VILANT 100-62.5-25 MCG/INH IN AEPB: 1.0000 | INHALATION_SPRAY | Freq: Every day | RESPIRATORY_TRACT | Status: DC

## 2020-03-14 MED ORDER — LACTATED RINGERS IV SOLN: INTRAVENOUS | Status: DC

## 2020-03-14 MED ORDER — FLUTICASONE FUROATE-VILANTEROL 100-25 MCG/INH IN AEPB
1.0000 | INHALATION_SPRAY | Freq: Every day | RESPIRATORY_TRACT | Status: DC
  Administered 2020-03-15 – 2020-03-16 (×2): 1 via RESPIRATORY_TRACT
  Filled 2020-03-14: qty 28

## 2020-03-14 MED ORDER — SODIUM CHLORIDE 0.9 % IV SOLN
2.0000 g | INTRAVENOUS | Status: DC
  Administered 2020-03-14 – 2020-03-15 (×2): 2 g via INTRAVENOUS
  Filled 2020-03-14 (×2): qty 2
  Filled 2020-03-14: qty 20

## 2020-03-14 MED ORDER — IPRATROPIUM-ALBUTEROL 0.5-2.5 (3) MG/3ML IN SOLN: 3.0000 mL | Freq: Four times a day (QID) | RESPIRATORY_TRACT | Status: DC | PRN

## 2020-03-14 MED ORDER — VANCOMYCIN HCL IN DEXTROSE 1-5 GM/200ML-% IV SOLN
1000.0000 mg | Freq: Once | INTRAVENOUS | Status: AC
  Administered 2020-03-14: 1000 mg via INTRAVENOUS
  Filled 2020-03-14: qty 200

## 2020-03-14 MED ORDER — FLUTICASONE FUROATE-VILANTEROL 100-25 MCG/INH IN AEPB: 1.0000 | INHALATION_SPRAY | Freq: Every day | RESPIRATORY_TRACT | Status: DC

## 2020-03-14 MED ORDER — ROSUVASTATIN CALCIUM 20 MG PO TABS
20.0000 mg | ORAL_TABLET | Freq: Every day | ORAL | Status: DC
  Administered 2020-03-14 – 2020-03-15 (×2): 20 mg via ORAL
  Filled 2020-03-14 (×2): qty 1

## 2020-03-14 MED ORDER — UMECLIDINIUM BROMIDE 62.5 MCG/INH IN AEPB
1.0000 | INHALATION_SPRAY | Freq: Every day | RESPIRATORY_TRACT | Status: DC
  Administered 2020-03-15 – 2020-03-16 (×2): 1 via RESPIRATORY_TRACT
  Filled 2020-03-14: qty 7

## 2020-03-14 MED ORDER — FLUTICASONE FUROATE-VILANTEROL 100-25 MCG/INH IN AEPB
1.0000 | INHALATION_SPRAY | Freq: Every day | RESPIRATORY_TRACT | Status: DC
  Filled 2020-03-14: qty 28

## 2020-03-14 MED ORDER — ADULT MULTIVITAMIN W/MINERALS CH
1.0000 | ORAL_TABLET | Freq: Every day | ORAL | Status: DC
  Administered 2020-03-15: 1 via ORAL
  Filled 2020-03-14: qty 1

## 2020-03-14 MED ORDER — METRONIDAZOLE IN NACL 5-0.79 MG/ML-% IV SOLN: 500.0000 mg | Freq: Once | INTRAVENOUS | Status: DC

## 2020-03-14 MED ORDER — ACETAMINOPHEN 325 MG PO TABS: 650.0000 mg | ORAL_TABLET | Freq: Four times a day (QID) | ORAL | Status: DC | PRN

## 2020-03-14 MED ORDER — ALBUTEROL SULFATE HFA 108 (90 BASE) MCG/ACT IN AERS: 2.0000 | INHALATION_SPRAY | Freq: Four times a day (QID) | RESPIRATORY_TRACT | Status: DC | PRN

## 2020-03-14 MED ORDER — ONDANSETRON HCL 4 MG/2ML IJ SOLN: 4.0000 mg | Freq: Four times a day (QID) | INTRAMUSCULAR | Status: DC | PRN

## 2020-03-14 MED ORDER — UMECLIDINIUM BROMIDE 62.5 MCG/INH IN AEPB
1.0000 | INHALATION_SPRAY | Freq: Every day | RESPIRATORY_TRACT | Status: DC
  Filled 2020-03-14: qty 7

## 2020-03-14 MED ORDER — INSULIN ASPART 100 UNIT/ML ~~LOC~~ SOLN
0.0000 [IU] | Freq: Three times a day (TID) | SUBCUTANEOUS | Status: DC
  Administered 2020-03-14: 3 [IU] via SUBCUTANEOUS
  Administered 2020-03-15: 7 [IU] via SUBCUTANEOUS
  Administered 2020-03-15: 3 [IU] via SUBCUTANEOUS
  Administered 2020-03-15 – 2020-03-16 (×2): 2 [IU] via SUBCUTANEOUS
  Administered 2020-03-16: 9 [IU] via SUBCUTANEOUS

## 2020-03-14 NOTE — Progress Notes (Signed)
Sepsis monitoring complete 

## 2020-03-14 NOTE — Progress Notes (Signed)
Manufacturing engineer [facility])  Briana Barr is currently our palliative patient in the community.  [facility] will follow while hospitalized and resume services once she is discharged.  [name] RN, BSN, [facility] Liaison

## 2020-03-14 NOTE — Progress Notes (Signed)
COMMUNITY PALLIATIVE CARE RN NOTE  PATIENT NAME: Briana Barr DOB: [birth year] MRN: [id number]  PRIMARY CARE PROVIDER: [name], MD  RESPONSIBLE PARTY: Briana Barr (daughter) Acct ID - Guarantor Home Phone Work Phone Relationship Acct Type  [id number] Briana Barr [phone]  Self P/F     [address] [place], [place], [place] [address]   Due to the COVID-19 crisis, this virtual check-in visit was done via telephone from my office and it was initiated and consent by this patient and or family.  PLAN OF CARE and INTERVENTION:  1. ADVANCE CARE PLANNING/GOALS OF CARE: Goal is for patient to remain at home and avoid hospitalizations. She has a DNR. 2. PATIENT/CAREGIVER EDUCATION: Symptom management, Pain Management, Safe Mobility, Fall prevention 3. DISEASE STATUS: Virtual check-in visit completed via telephone. Patient is reporting pain and swelling in her right foot (no bruising), especially with walking.  She ambulates using a walker. She had a fall last night getting out of her recliner. She has a skin tear to her left elbow. She also had a red area on her bottom, but looks normal this morning. Daughter does not feel that she hit her head from her observation. She gave patient a Hydrocodone tablet to help. She did eat breakfast this morning and now wants to go lie down for a bit. She also has been c/o pain in her R side region. She has a previously scheduled appointment this evening with her PCP. Advised that they would be able to assess her foot and other issues and recommend/perform the proper interventions. Daughter also says that patient's blood sugars have been more elevated. They usually range between 190-210. Most recent one was 28, but daughter feels this may be d/t stress and pain. Will continue to monitor.  HISTORY OF PRESENT ILLNESS: This is a 79 yo female with a history of HTN, COPD and DM II. Palliative care team continues to follow patient for additional support. Will make  monthly and PRN contact/visits.     CODE STATUS: DNR ADVANCED DIRECTIVES: Y MOST FORM: no PPS: 50%   (Duration of visit and documentation 30 minutes)   Briana Eastern, RN BSN

## 2020-03-14 NOTE — Progress Notes (Signed)
A consult was received from an ED physician for vancomycin and cefepime per pharmacy dosing.  The patient's profile has been reviewed for ht/wt/allergies/indication/available labs.   A one time order has been placed for vancomycin 1gm and cefepime 2gm   Further antibiotics/pharmacy consults should be ordered by admitting physician if indicated.                       Thank you, Dolly Rias RPh 03/14/2020, 12:07 AM

## 2020-03-14 NOTE — ED Provider Notes (Signed)
[facility] DEPT Provider Note   CSN: [id number] Arrival date & time: 03/13/20  2327     History Chief Complaint  Patient presents with  . Fever    Briana Barr is a 79 y.o. female.  History via her family who takes care of her.  Patient is a DNR with a MOST form at bedside.  They do want antibiotics despite what the MOST form says.  Patient has COPD and is on 6 L of oxygen she usually sats 99 to 98%.  Over the last couple days has been having fevers she was seen by her primary doctor who thought that she might have pneumonia based on her lung exam but her chest x-ray is clear.  Started on Levaquin is only had 1 dose but had multiple episodes of emesis.  Also had a T-max of 106 at home which did not seem to get better with antipyretics with sent here for further evaluation.  No worsening cough.  No worsening productive cough.  No urinary symptoms.  She has had generalized weakness and mild confusion with a couple falls recently but no traumatic injuries.  The history is provided by the patient and a relative.  Fever Max temp prior to arrival:  106 Temp source:  Oral Severity:  Severe Onset quality:  Gradual Timing:  Intermittent Progression:  Waxing and waning Chronicity:  New Relieved by:  Acetaminophen Worsened by:  Nothing Ineffective treatments:  None tried Associated symptoms: no chest pain        Past Medical History:  Diagnosis Date  . Barrett esophagus   . Colon polyps    benign  . COPD (chronic obstructive pulmonary disease) ([place])   . Duodenal ulcer   . DVT (deep venous thrombosis) ([facility])    left leg x 2  . Gallstones   . GERD (gastroesophageal reflux disease)   . Glaucoma   . Hyperlipidemia   . Ischemic colitis ([place])   . Osteoporosis   . Pneumonia    hx of x 2 as a child   . Renal cyst, right   . Retinopathy, background, proliferative     Patient Active Problem List   Diagnosis Date Noted  . Bacteremia 11/14/2019  .  Acute respiratory failure with hypoxia ([place]) 11/13/2019  . COPD (chronic obstructive pulmonary disease) ([place]) 11/13/2019  . Diabetes mellitus type 2 in nonobese ([place]) 11/13/2019  . Essential hypertension 11/13/2019  . Community acquired pneumonia of left lower lobe of lung   . Abnormal CT scan, colon   . Benign neoplasm of sigmoid colon   . Benign neoplasm of rectum   . Loss of weight   . Epigastric abdominal tenderness without rebound tenderness   . Anorexia     Past Surgical History:  Procedure Laterality Date  . cervical vertebral fusion     c5-6, 6-7  . CHOLECYSTECTOMY    . COLONOSCOPY WITH PROPOFOL N/A 11/23/2016   Procedure: COLONOSCOPY WITH PROPOFOL;  Surgeon: Ladene Artist, MD;  Location: [facility] ENDOSCOPY;  Service: Endoscopy;  Laterality: N/A;  . ESOPHAGOGASTRODUODENOSCOPY (EGD) WITH PROPOFOL N/A 11/23/2016   Procedure: ESOPHAGOGASTRODUODENOSCOPY (EGD) WITH PROPOFOL;  Surgeon: Ladene Artist, MD;  Location: [facility] END[facility]COPY;  Service: Endoscopy;  Laterality: N/A;  . FEMORAL-POPLITEAL BYPASS GRAFT    . IR RADIOLOGIST EVAL & MGMT  06/06/2019     OB History   No obstetric history on file.     Family History  Problem Relation Age of Onset  . Other Father  some sort of blood cancer-had to get transfusions  . Stomach cancer Maternal Grandmother   . Stomach cancer Maternal Grandfather     Social History   Tobacco Use  . Smoking status: Current Every Day Smoker    Packs/day: 1.00    Types: Cigarettes  . Smokeless tobacco: Never Used  . Tobacco comment: started age 89  Vaping Use  . Vaping Use: Never used  Substance Use Topics  . Alcohol use: No  . Drug use: No    Home Medications Prior to Admission medications   Medication Sig Start Date End Date Taking? Authorizing Provider  acetaZOLAMIDE (DIAMOX) 250 MG tablet TAKE 1 TABLET BY MOUTH EVERY DAY Patient taking differently: Take 250 mg by mouth daily.  02/13/20  Yes [name], MD  albuterol (PROAIR  HFA) 108 (90 Base) MCG/ACT inhaler Inhale 2 puffs into the lungs every 6 (six) hours as needed for wheezing or shortness of breath. 03/06/19  Yes [place], Modena Nunnery, MD  aspirin 81 MG tablet Take 81 mg by mouth daily.   Yes [provider]  Cholecalciferol (VITAMIN D3) 250 MCG (10000 UT) capsule Take 1 capsule (10,000 Units total) by mouth daily. 01/20/19  Yes [name], MD  ELDERBERRY PO Take 1 each by mouth daily.   Yes [provider]  escitalopram (LEXAPRO) 10 MG tablet TAKE 1 TABLET BY MOUTH EVERY DAY Patient taking differently: Take 10 mg by mouth daily.  02/15/20  Yes [name], MD  Fluticasone-Umeclidin-Vilant (TRELEGY ELLIPTA) 100-62.5-25 MCG/INH AEPB Inhale 1 Inhaler into the lungs daily. 02/29/20  Yes [name], MD  HYDROcodone-acetaminophen (NORCO) 5-325 MG tablet Take 1 tablet by mouth 4 (four) times daily. Patient taking differently: Take 1 tablet by mouth every 6 (six) hours as needed for moderate pain.  02/29/20  Yes [name], MD  ipratropium-albuterol (DUONEB) 0.5-2.5 (3) MG/3ML SOLN Take 3 mLs by nebulization every 6 (six) hours as needed. Patient taking differently: Take 3 mLs by nebulization every 6 (six) hours as needed (SOB, wheezing).  09/28/19  Yes [name], MD  levofloxacin (LEVAQUIN) 500 MG tablet Take 1 tablet (500 mg total) by mouth daily. 11/24/19  Yes [name], MD  losartan (COZAAR) 50 MG tablet Take 1 tablet (50 mg total) by mouth daily. 02/02/20  Yes [name], MD  metFORMIN (GLUCOPHAGE) 500 MG tablet Take 1 tablet (500 mg total) by mouth 2 (two) times daily with a meal. 02/02/20  Yes [name], MD  Multiple Vitamins-Minerals (CENTRUM SILVER 50+WOMEN) TABS Take 1 tablet by mouth daily. 01/20/19  Yes [name], MD  omeprazole (PRILOSEC) 20 MG capsule TAKE 1 CAPSULE BY MOUTH TWICE A DAY BEFORE A MEAL Patient taking differently: Take 20 mg by mouth 2 (two) times daily before a meal.   02/02/20  Yes [place], Cammie Mcgee, MD  rosuvastatin (CRESTOR) 20 MG tablet TAKE 1 TABLET BY MOUTH EVERYDAY AT BEDTIME Patient taking differently: Take 20 mg by mouth at bedtime.  02/12/20  Yes [name], MD  glimepiride (AMARYL) 1 MG tablet TAKE 1 TABLET BY MOUTH EVERY DAY IN THE MORNING Patient not taking: Reported on 02/02/2020 11/28/19   [name], MD    Allergies    Vioxx [rofecoxib]  Review of Systems   Review of Systems  Unable to perform ROS: Dementia  Constitutional: Positive for fever.  Cardiovascular: Negative for chest pain.    Physical Exam Updated Vital Signs BP 133/65  Pulse 79   Temp 98.9 F (37.2 C)   Resp 18   Ht 5\' 4"  (1.626 m)   Wt 67 kg   SpO2 95%   BMI 25.35 kg/m   Physical Exam Vitals and nursing note reviewed.  Constitutional:      Appearance: She is well-developed.  HENT:     Head: Normocephalic and atraumatic.     Mouth/Throat:     Mouth: Mucous membranes are moist.     Pharynx: Oropharynx is clear.  Eyes:     Pupils: Pupils are equal, round, and reactive to light.  Cardiovascular:     Rate and Rhythm: Regular rhythm. Tachycardia present.  Pulmonary:     Effort: Tachypnea present. No respiratory distress.     Breath sounds: No stridor. Decreased breath sounds and wheezing present.  Abdominal:     General: There is no distension.     Tenderness: There is no abdominal tenderness.  Musculoskeletal:        General: No swelling or tenderness. Normal range of motion.     Cervical back: Normal range of motion.  Skin:    General: Skin is warm and dry.  Neurological:     General: No focal deficit present.     Mental Status: She is alert.     ED Results / Procedures / Treatments   Labs (all labs ordered are listed, but only abnormal results are displayed) Labs Reviewed  COMPREHENSIVE METABOLIC PANEL - Abnormal; Notable for the following components:      Result Value   Glucose, Bld 291 (*)    BUN 26 (*)    Calcium 8.6 (*)     All other components within normal limits  CBC WITH DIFFERENTIAL/PLATELET - Abnormal; Notable for the following components:   WBC 17.0 (*)    RBC 3.83 (*)    Hemoglobin 11.5 (*)    Platelets 461 (*)    Neutro Abs 14.5 (*)    Lymphs Abs 0.5 (*)    Monocytes Absolute 1.5 (*)    Abs Immature Granulocytes 0.24 (*)    All other components within normal limits  APTT - Abnormal; Notable for the following components:   aPTT 37 (*)    All other components within normal limits  URINALYSIS, ROUTINE W REFLEX MICROSCOPIC - Abnormal; Notable for the following components:   APPearance HAZY (*)    Glucose, UA >=500 (*)    Ketones, ur 5 (*)    Bacteria, UA RARE (*)    All other components within normal limits  RESP PANEL BY RT-PCR (FLU A&B, COVID) ARPGX2  CULTURE, BLOOD (ROUTINE X 2)  CULTURE, BLOOD (ROUTINE X 2)  URINE CULTURE  LACTIC ACID, PLASMA  PROTIME-INR    EKG EKG Interpretation  Date/Time:  Wednesday March 13 2020 23:51:50 EST Ventricular Rate:  93 PR Interval:    QRS Duration: 91 QT Interval:  359 QTC Calculation: 447 R Axis:   77 Text Interpretation: Sinus rhythm Probable left atrial enlargement Low voltage, precordial leads ST elevation, consider inferior injury Confirmed by Merrily Pew [phone]) on 03/14/2020 12:38:45 AM   Radiology DG Chest 2 View  Result Date: 03/12/2020 CLINICAL DATA:  Cough. EXAM: CHEST - 2 VIEW COMPARISON:  November 19, 2019. FINDINGS: The heart size and mediastinal contours are within normal limits. Both lungs are clear. No pneumothorax or pleural effusion is noted. The visualized skeletal structures are unremarkable. IMPRESSION: No active cardiopulmonary disease. Electronically Signed   By: Bobbe Medico.D.  On: 03/12/2020 20:47   DG Chest Port 1 View  Result Date: 03/14/2020 CLINICAL DATA:  Fever and weakness EXAM: PORTABLE CHEST 1 VIEW COMPARISON:  03/12/2020 FINDINGS: Cardiac shadow is within normal limits. Aortic calcifications are again  seen. Developing right basilar infiltrate is seen new from the prior exam. No other focal infiltrate is seen. No bony abnormality is noted. IMPRESSION: New developing right basilar infiltrate. Electronically Signed   By: Inez Catalina M.D.   On: 03/14/2020 00:16   DG Abd 2 Views  Result Date: 03/12/2020 CLINICAL DATA:  Right upper quadrant abdominal pain. EXAM: ABDOMEN - 2 VIEW COMPARISON:  None. FINDINGS: The bowel gas pattern is normal. Status post cholecystectomy. There is no evidence of free air. No radio-opaque calculi or other significant radiographic abnormality is seen. IMPRESSION: Negative. Electronically Signed   By: Marijo Conception M.D.   On: 03/12/2020 20:48   DG Foot Complete Right  Result Date: 03/12/2020 CLINICAL DATA:  Right foot pain. EXAM: RIGHT FOOT COMPLETE - 3+ VIEW COMPARISON:  None. FINDINGS: There is no evidence of fracture or dislocation. There is no evidence of arthropathy or other focal bone abnormality. Soft tissues are unremarkable. IMPRESSION: Negative. Electronically Signed   By: Marijo Conception M.D.   On: 03/12/2020 20:50    Procedures .Critical Care Performed by: Merrily Pew, MD Authorized by: Merrily Pew, MD   Critical care provider statement:    Critical care time (minutes):  45   Critical care was necessary to treat or prevent imminent or life-threatening deterioration of the following conditions:  Sepsis   Critical care was time spent personally by me on the following activities:  Discussions with consultants, evaluation of patient's response to treatment, examination of patient, ordering and performing treatments and interventions, ordering and review of laboratory studies, ordering and review of radiographic studies, pulse oximetry, re-evaluation of patient's condition, obtaining history from patient or surrogate and review of old charts   (including critical care time)  Medications Ordered in ED Medications  lactated ringers infusion ( Intravenous  Rate/Dose Change 03/14/20 0503)  ceFEPIme (MAXIPIME) 2 g in sodium chloride 0.9 % 100 mL IVPB (0 g Intravenous Stopped 03/14/20 0056)  vancomycin (VANCOCIN) IVPB 1000 mg/200 mL premix (0 mg Intravenous Stopped 03/14/20 0122)  lactated ringers bolus 1,000 mL (0 mLs Intravenous Stopped 03/14/20 0300)    And  lactated ringers bolus 1,000 mL (0 mLs Intravenous Stopped 03/14/20 0300)    And  lactated ringers bolus 250 mL (0 mLs Intravenous Stopped 03/14/20 0030)    ED Course  I have reviewed the triage vital signs and the nursing notes.  Pertinent labs & imaging results that were available during my care of the patient were reviewed by me and considered in my medical decision making (see chart for details).    MDM Rules/Calculators/A&P                          Sepsis called on arrival and broad-spectrum antibiotic started.  Patient's fever did get better.  She was bolused with fluids however lactic acid ended up being within normal limits and she was not in shock so did not continue to boluses although up to 30 cc/kg and just started on a maintenance infusion.  X-ray does show evidence of likely right lower lobe pneumonia as depicted by her primary care doctor.  We will continue broad-spectrum antibiotics until blood cultures and urine cultures come back.  Discussed with hospitalist, Dr.  Alcario Drought, who will admit for further observation.  Family updated on care up to this point.  Final Clinical Impression(s) / ED Diagnoses Final diagnoses:  Community acquired pneumonia of right lower lobe of lung    Rx / DC Orders ED Discharge Orders    None       Mikko Lewellen, Corene Cornea, MD 03/14/20 [phone]

## 2020-03-14 NOTE — Sepsis Progress Note (Signed)
Following for sepsis monitoring ?

## 2020-03-14 NOTE — Progress Notes (Signed)
RT Evaluation note:  Pt has a history of COPD, current smoker and 6L [place] oxygen at baseline. Admitted for pneumonia and fever. Pt currently takes Trilogy inhaler once daily and albuterol inhaler Q6 as needed at home. Pt states she has not needed rescue inhaler any time recently, son at bedside to confirm. Currently pt has scheduled Breo and incruse inhalers to replace her Trilogy, Duo nebulizer's Q6 as needed and a albuterol inhaler Q6 as needed. Pt breath sounds are diminished to auscultation and is currently on her 6L [place] baseline with saturations of 95%. Pt states she does not feel SOB or has at any time recently felt SOB. CXR as of 03/14/20 shows a developing right basilar infiltrate. Pt scores a 5 on the RT protocol assessment. RT plan at this time is to continue with pt home inhalers and ordered PRN nebulizers. Follow-up assessments will be done daily to evaluate pt needs. Vitals are stable at this time, RT will continue to monitor.

## 2020-03-14 NOTE — ED Notes (Signed)
Called report to Desiree Lucy., RN on 6E

## 2020-03-14 NOTE — H&P (Signed)
History and Physical    Briana Barr ONG:[id number] DOB: Mar 22, 1941 DOA: 03/13/2020  PCP: [name], MD  Patient coming from: Home  Chief Complaint: Fevers  HPI: Briana Barr is a 79 y.o. female with medical history significant of COPD, GERD, HLD. Presenting with fevers. Patient is poor historian. History from son. Patient had a fall on Monday. She was seen by her PCP and everything seemed ok, so she went home. She was in her normal state of health until yesterday afternoon. Family noted that she was shivering. Her son took her temp. The first temp was 99.8, but as he continued to check over the course of the evening, it rose to 101.8 and then 104.8. They spoke with her PCP who ordered some abx for her took. When they tried to pick the abx up, the family noticed that he temp was remaining high, she was weak, and she was having nausea and vomiting. They were concerned and had her transported to the ED.    Had a fall on Monday. Got looked at by PCP and everything was ok. Family member notified son that she was shivering last night. He checked temp, first was 99.8, next was 101.8 and then 104.8. N/V started then. Lost her mobility (unable to stand even with help). Spoke with PCP. He ordered some abx. Family went to pick up medicine, but her fever stayed too high. They became concerned and came to ED.   ED Course: CXR showed RLL PNA. COVID was negative. Started on vanc/cefepime. TRH called for admission.   Review of Systems:  Unable to obtain d/t mentation.    PMHx Past Medical History:  Diagnosis Date  . Barrett esophagus   . Colon polyps    benign  . COPD (chronic obstructive pulmonary disease) ([place])   . Duodenal ulcer   . DVT (deep venous thrombosis) ([facility])    left leg x 2  . Gallstones   . GERD (gastroesophageal reflux disease)   . Glaucoma   . Hyperlipidemia   . Ischemic colitis ([place])   . Osteoporosis   . Pneumonia    hx of x 2 as a child   . Renal cyst, right   .  Retinopathy, background, proliferative     PSHx Past Surgical History:  Procedure Laterality Date  . cervical vertebral fusion     c5-6, 6-7  . CHOLECYSTECTOMY    . COLONOSCOPY WITH PROPOFOL N/A 11/23/2016   Procedure: COLONOSCOPY WITH PROPOFOL;  Surgeon: Ladene Artist, MD;  Location: [facility];  Service: Endoscopy;  Laterality: N/A;  . ESOPHAGOGASTRODUODENOSCOPY (EGD) WITH PROPOFOL N/A 11/23/2016   Procedure: ESOPHAGOGASTRODUODENOSCOPY (EGD) WITH PROPOFOL;  Surgeon: Ladene Artist, MD;  Location: [facility];  Service: Endoscopy;  Laterality: N/A;  . FEMORAL-POPLITEAL BYPASS GRAFT    . IR RADIOLOGIST EVAL & MGMT  06/06/2019    SocHx  reports that she has been smoking cigarettes. She has been smoking about 1.00 pack per day. She has never used smokeless tobacco. She reports that she does not drink alcohol and does not use drugs.  Allergies  Allergen Reactions  . Vioxx [Rofecoxib] Swelling    FamHx Family History  Problem Relation Age of Onset  . Other Father        some sort of blood cancer-had to get transfusions  . Stomach cancer Maternal Grandmother   . Stomach cancer Maternal Grandfather     Prior to Admission medications   Medication Sig Start Date End Date Taking? Authorizing Provider  acetaZOLAMIDE (DIAMOX) 250 MG tablet TAKE 1 TABLET BY MOUTH EVERY DAY Patient taking differently: Take 250 mg by mouth daily.  02/13/20  Yes [name], MD  albuterol (PROAIR HFA) 108 (90 Base) MCG/ACT inhaler Inhale 2 puffs into the lungs every 6 (six) hours as needed for wheezing or shortness of breath. 03/06/19  Yes Berwyn, Modena Nunnery, MD  aspirin 81 MG tablet Take 81 mg by mouth daily.   Yes [provider]  Cholecalciferol (VITAMIN D3) 250 MCG (10000 UT) capsule Take 1 capsule (10,000 Units total) by mouth daily. 01/20/19  Yes [name], MD  ELDERBERRY PO Take 1 each by mouth daily.   Yes [provider]  escitalopram (LEXAPRO) 10 MG tablet TAKE 1  TABLET BY MOUTH EVERY DAY Patient taking differently: Take 10 mg by mouth daily.  02/15/20  Yes [name], MD  Fluticasone-Umeclidin-Vilant (TRELEGY ELLIPTA) 100-62.5-25 MCG/INH AEPB Inhale 1 Inhaler into the lungs daily. 02/29/20  Yes [name], MD  HYDROcodone-acetaminophen (NORCO) 5-325 MG tablet Take 1 tablet by mouth 4 (four) times daily. Patient taking differently: Take 1 tablet by mouth every 6 (six) hours as needed for moderate pain.  02/29/20  Yes [name], MD  ipratropium-albuterol (DUONEB) 0.5-2.5 (3) MG/3ML SOLN Take 3 mLs by nebulization every 6 (six) hours as needed. Patient taking differently: Take 3 mLs by nebulization every 6 (six) hours as needed (SOB, wheezing).  09/28/19  Yes [name], MD  levofloxacin (LEVAQUIN) 500 MG tablet Take 1 tablet (500 mg total) by mouth daily. 11/24/19  Yes [name], MD  losartan (COZAAR) 50 MG tablet Take 1 tablet (50 mg total) by mouth daily. 02/02/20  Yes [name], MD  metFORMIN (GLUCOPHAGE) 500 MG tablet Take 1 tablet (500 mg total) by mouth 2 (two) times daily with a meal. 02/02/20  Yes [name], MD  Multiple Vitamins-Minerals (CENTRUM SILVER 50+WOMEN) TABS Take 1 tablet by mouth daily. 01/20/19  Yes [name], MD  omeprazole (PRILOSEC) 20 MG capsule TAKE 1 CAPSULE BY MOUTH TWICE A DAY BEFORE A MEAL Patient taking differently: Take 20 mg by mouth 2 (two) times daily before a meal.  02/02/20  Yes Pickard, Cammie Mcgee, MD  rosuvastatin (CRESTOR) 20 MG tablet TAKE 1 TABLET BY MOUTH EVERYDAY AT BEDTIME Patient taking differently: Take 20 mg by mouth at bedtime.  02/12/20  Yes [name], MD  glimepiride (AMARYL) 1 MG tablet TAKE 1 TABLET BY MOUTH EVERY DAY IN THE MORNING Patient not taking: Reported on 02/02/2020 11/28/19   [name], MD    Physical Exam: Vitals:   03/14/20 0600 03/14/20 0615 03/14/20 0630 03/14/20 0722  BP: 133/65 134/68 136/64 133/71  Pulse: 79 77 80 80   Resp: 18 20 (!) 23 20  Temp:    99.2 F (37.3 C)  TempSrc:      SpO2: 95% 94% 92% 94%  Weight:      Height:        General: 79 y.o. female resting in bed in NAD Eyes: PERRL, normal sclera ENMT: Nares patent w/o discharge, orophaynx clear, dentition normal, ears w/o discharge/lesions/ulcers Neck: Supple, trachea midline Cardiovascular: RRR, +S1, S2, no m/g/r, equal pulses throughout Respiratory: b/l soft exp wheeze, RLL rhonchi, normal WOB on 6L  (6L is her baseline O2 use) GI: BS+, NDNT, no masses noted, no organomegaly noted MSK: No e/c/c Skin: No rashes, bruises, ulcerations noted Neuro: A&O x 2, no focal deficits  Psyc: She is a little confused, affect ok, calm/cooperative  Labs on Admission: I have personally reviewed following labs and imaging studies  CBC: Recent Labs  Lab 03/14/20 0000  WBC 17.0*  NEUTROABS 14.5*  HGB 11.5*  HCT 37.8  MCV 98.7  PLT 188*   Basic Metabolic Panel: Recent Labs  Lab 03/14/20 0000  NA 136  K 3.8  CL 102  CO2 25  GLUCOSE 291*  BUN 26*  CREATININE 0.63  CALCIUM 8.6*   GFR: Estimated Creatinine Clearance: 54.5 mL/min (by C-G formula based on SCr of 0.63 mg/dL). Liver Function Tests: Recent Labs  Lab 03/14/20 0000  AST 17  ALT 19  ALKPHOS 55  BILITOT 0.4  PROT 7.0  ALBUMIN 3.6   No results for input(s): LIPASE, AMYLASE in the last 168 hours. No results for input(s): AMMONIA in the last 168 hours. Coagulation Profile: Recent Labs  Lab 03/14/20 0000  INR 1.1   Cardiac Enzymes: No results for input(s): CKTOTAL, CKMB, CKMBINDEX, TROPONINI in the last 168 hours. BNP (last 3 results) No results for input(s): PROBNP in the last 8760 hours. HbA1C: No results for input(s): HGBA1C in the last 72 hours. CBG: No results for input(s): GLUCAP in the last 168 hours. Lipid Profile: No results for input(s): CHOL, HDL, LDLCALC, TRIG, CHOLHDL, LDLDIRECT in the last 72 hours. Thyroid Function Tests: No results for input(s):  TSH, T4TOTAL, FREET4, T3FREE, THYROIDAB in the last 72 hours. Anemia Panel: No results for input(s): VITAMINB12, FOLATE, FERRITIN, TIBC, IRON, RETICCTPCT in the last 72 hours. Urine analysis:    Component Value Date/Time   COLORURINE YELLOW 03/14/2020 0000   APPEARANCEUR HAZY (A) 03/14/2020 0000   LABSPEC 1.025 03/14/2020 0000   PHURINE 5.0 03/14/2020 0000   GLUCOSEU >=500 (A) 03/14/2020 0000   HGBUR NEGATIVE 03/14/2020 0000   BILIRUBINUR NEGATIVE 03/14/2020 0000   KETONESUR 5 (A) 03/14/2020 0000   PROTEINUR NEGATIVE 03/14/2020 0000   NITRITE NEGATIVE 03/14/2020 0000   LEUKOCYTESUR NEGATIVE 03/14/2020 0000    Radiological Exams on Admission: DG Chest 2 View  Result Date: 03/12/2020 CLINICAL DATA:  Cough. EXAM: CHEST - 2 VIEW COMPARISON:  November 19, 2019. FINDINGS: The heart size and mediastinal contours are within normal limits. Both lungs are clear. No pneumothorax or pleural effusion is noted. The visualized skeletal structures are unremarkable. IMPRESSION: No active cardiopulmonary disease. Electronically Signed   By: Marijo Conception M.D.   On: 03/12/2020 20:47   DG Chest Port 1 View  Result Date: 03/14/2020 CLINICAL DATA:  Fever and weakness EXAM: PORTABLE CHEST 1 VIEW COMPARISON:  03/12/2020 FINDINGS: Cardiac shadow is within normal limits. Aortic calcifications are again seen. Developing right basilar infiltrate is seen new from the prior exam. No other focal infiltrate is seen. No bony abnormality is noted. IMPRESSION: New developing right basilar infiltrate. Electronically Signed   By: Inez Catalina M.D.   On: 03/14/2020 00:16   DG Abd 2 Views  Result Date: 03/12/2020 CLINICAL DATA:  Right upper quadrant abdominal pain. EXAM: ABDOMEN - 2 VIEW COMPARISON:  None. FINDINGS: The bowel gas pattern is normal. Status post cholecystectomy. There is no evidence of free air. No radio-opaque calculi or other significant radiographic abnormality is seen. IMPRESSION: Negative. Electronically  Signed   By: Marijo Conception M.D.   On: 03/12/2020 20:48   DG Foot Complete Right  Result Date: 03/12/2020 CLINICAL DATA:  Right foot pain. EXAM: RIGHT FOOT COMPLETE - 3+ VIEW COMPARISON:  None. FINDINGS: There is no  evidence of fracture or dislocation. There is no evidence of arthropathy or other focal bone abnormality. Soft tissues are unremarkable. IMPRESSION: Negative. Electronically Signed   By: Marijo Conception M.D.   On: 03/12/2020 20:50    EKG: Independently reviewed. Sinus, minimal st elevation in avf  Assessment/Plan CAP     - admit to inpatient, med-sug     - CAP coverage with rocephin, zithro     - chronically on 6L O2, but she doesn't feel her breathing is at her baseline     - add incentive spiro  DM2     - SSI, DM2 diet, A1c, glucose checks     - last A1c: 7.0 (8/21)  HTN     - resume home meds as BP tolerates  COPD on chronic O2 (6L)     - resume home inhalers, nebs  HLD     - resume home statin  DVT prophylaxis: lovenox  Code Status: DNR  Family Communication: Spoke with son, Skipper Cliche, by phone.  Consults called: None  Status is: Inpatient  Remains inpatient appropriate because:Inpatient level of care appropriate due to severity of illness   Dispo: The patient is from: Home              Anticipated d/c is to: Home              Anticipated d/c date is: 2 days              Patient currently is not medically stable to d/c.  Jonnie Finner DO [facility]  If 7PM-7AM, please contact night-coverage [email or web address]  03/14/2020, 7:32 AM

## 2020-03-14 NOTE — ED Notes (Signed)
Pt son Doreene Nest updated

## 2020-03-15 DIAGNOSIS — E118 Type 2 diabetes mellitus with unspecified complications: Secondary | ICD-10-CM | POA: Diagnosis not present

## 2020-03-15 DIAGNOSIS — E876 Hypokalemia: Secondary | ICD-10-CM

## 2020-03-15 DIAGNOSIS — D72829 Elevated white blood cell count, unspecified: Secondary | ICD-10-CM | POA: Diagnosis not present

## 2020-03-15 DIAGNOSIS — E1165 Type 2 diabetes mellitus with hyperglycemia: Secondary | ICD-10-CM

## 2020-03-15 DIAGNOSIS — J189 Pneumonia, unspecified organism: Secondary | ICD-10-CM | POA: Diagnosis not present

## 2020-03-15 LAB — COMPREHENSIVE METABOLIC PANEL
ALT: 26 U/L (ref 0–44)
AST: 23 U/L (ref 15–41)
Albumin: 3.3 g/dL — ABNORMAL LOW (ref 3.5–5.0)
Alkaline Phosphatase: 50 U/L (ref 38–126)
Anion gap: 11 (ref 5–15)
BUN: 10 mg/dL (ref 8–23)
CO2: 25 mmol/L (ref 22–32)
Calcium: 9.1 mg/dL (ref 8.9–10.3)
Chloride: 103 mmol/L (ref 98–111)
Creatinine, Ser: 0.55 mg/dL (ref 0.44–1.00)
GFR, Estimated: >60 mL/min (ref >60)
Glucose, Bld: 211 mg/dL — ABNORMAL HIGH (ref 70–99)
Potassium: 3.3 mmol/L — ABNORMAL LOW (ref 3.5–5.1)
Sodium: 139 mmol/L (ref 135–145)
Total Bilirubin: 0.6 mg/dL (ref 0.3–1.2)
Total Protein: 6.8 g/dL (ref 6.5–8.1)

## 2020-03-15 LAB — CBC
HCT: 38.5 % (ref 36.0–46.0)
Hemoglobin: 11.7 g/dL — ABNORMAL LOW (ref 12.0–15.0)
MCH: 29.8 pg (ref 26.0–34.0)
MCHC: 30.4 g/dL (ref 30.0–36.0)
MCV: 98.2 fL (ref 80.0–100.0)
Platelets: 405 10*3/uL — ABNORMAL HIGH (ref 150–400)
RBC: 3.92 MIL/uL (ref 3.87–5.11)
RDW: 13.4 % (ref 11.5–15.5)
WBC: 11.7 10*3/uL — ABNORMAL HIGH (ref 4.0–10.5)
nRBC: 0 % (ref 0.0–0.2)

## 2020-03-15 LAB — GLUCOSE, CAPILLARY
Glucose-Capillary: 191 mg/dL — ABNORMAL HIGH (ref 70–99)
Glucose-Capillary: 305 mg/dL — ABNORMAL HIGH (ref 70–99)
Glucose-Capillary: 236 mg/dL — ABNORMAL HIGH (ref 70–99)
Glucose-Capillary: 237 mg/dL — ABNORMAL HIGH (ref 70–99)

## 2020-03-15 LAB — URINE CULTURE

## 2020-03-15 LAB — MAGNESIUM: Magnesium: 1.7 mg/dL (ref 1.7–2.4)

## 2020-03-15 LAB — PHOSPHORUS: Phosphorus: 2.7 mg/dL (ref 2.5–4.6)

## 2020-03-15 MED ORDER — GUAIFENESIN ER 600 MG PO TB12
1200.0000 mg | ORAL_TABLET | Freq: Two times a day (BID) | ORAL | Status: DC
  Administered 2020-03-15 – 2020-03-16 (×3): 1200 mg via ORAL
  Filled 2020-03-15 (×3): qty 2

## 2020-03-15 MED ORDER — INSULIN GLARGINE 100 UNIT/ML ~~LOC~~ SOLN
5.0000 [IU] | Freq: Every day | SUBCUTANEOUS | Status: DC
  Administered 2020-03-15 – 2020-03-16 (×2): 5 [IU] via SUBCUTANEOUS
  Filled 2020-03-15 (×2): qty 0.05

## 2020-03-15 MED ORDER — POTASSIUM CHLORIDE CRYS ER 20 MEQ PO TBCR
40.0000 meq | EXTENDED_RELEASE_TABLET | Freq: Two times a day (BID) | ORAL | Status: AC
  Administered 2020-03-15 (×2): 40 meq via ORAL
  Filled 2020-03-15 (×2): qty 2

## 2020-03-15 NOTE — Progress Notes (Signed)
PROGRESS NOTE    Marg Savidge  JAS:[id number] DOB: 1940-10-21 DOA: 03/13/2020 PCP: [name], MD   Brief Narrative:  HPI per Dr. Cherylann Ratel on 03/14/20 Allyiah Greenhaw is a 79 y.o. female with medical history significant of COPD, GERD, HLD. Presenting with fevers. Patient is poor historian. History from son. Patient had a fall on Monday. She was seen by her PCP and everything seemed ok, so she went home. She was in her normal state of health until yesterday afternoon. Family noted that she was shivering. Her son took her temp. The first temp was 99.8, but as he continued to check over the course of the evening, it rose to 101.8 and then 104.8. They spoke with her PCP who ordered some abx for her took. When they tried to pick the abx up, the family noticed that he temp was remaining high, she was weak, and she was having nausea and vomiting. They were concerned and had her transported to the ED.    Had a fall on Monday. Got looked at by PCP and everything was ok. Family member notified son that she was shivering last night. He checked temp, first was 99.8, next was 101.8 and then 104.8. N/V started then. Lost her mobility (unable to stand even with help). Spoke with PCP. He ordered some abx. Family went to pick up medicine, but her fever stayed too high. They became concerned and came to ED.   ED Course: CXR showed RLL PNA. COVID was negative. Started on vanc/cefepime. TRH called for admission.   **Interim History  Continues to be a little dyspneic but is improving.  Labs are improving and she continues to be on 7 L supplemental oxygen.  Will attempt to wean back down to her normal supplemental oxygen requirements.  Will obtain PT and OT evaluation.  Assessment & Plan:   Active Problems:   CAP (community acquired pneumonia)  Right LL CAP, poA -Admitted to Inpatient [facility] -WBC on Admission was 17.0 and is improving and went from 17.0 -> 14.8 -> 11.7 -C/w CAP overage with IV  Azithromycin and IV Ceftriaxone -Nebs and Inhalers as below -Will add Incentive Spirometry, Flutter Valve and Guaifenesin 1200 mg po BID -Chronically on 6L O2 but now wearing 7 Liters here, but she doesn't feel her breathing is at her baseline -CXR on admission showed "Cardiac shadow is within normal limits. Aortic calcifications are again seen. Developing right basilar infiltrate is seen new from the prior exam. No other focal infiltrate is seen. No bony abnormality is noted." -Repeat CXR in the AM -OOB and Mobilize -Patient is on Palliative Care as an outpatient and will be followed by [facility] as an outpatient   Uncontrolled DM2 -C/w Carb Modified Diet -Holding Glimperide po 1 tag  -Add Lantus 5 units sq Daily -Given 4 units of Novolog yesterday -C/w Sensitive Novolog SSI AC -Repeat HbA1c this AM was 8.7 -CBG's ranging from 207-305; Blood Sugar on AM CMP ws 211  HTN -C/w Losartan 50 mg po Daily  -Continue to Monitor BP per Protocol -Last BP was   Hypokalemia -Patient's K+ this AM was 3.3 -Replete with po KCl 40 mEQ BID x2 -Mag Level was 1.7 so will replete with IV Mag Sulfate 2 grams -Continue to Monitor and Replete as Necessary -Repeat CMP in the AM   Thrombocytosis -Mild and in the setting of Above -Platelet Count went from 461 -> 390 -> 405 -Continue to Monitor and Trend -Repeat CBC in the AM  GERD/Hx of Barrett's Esophagus/Hx of Duodenal Ulcer -Does not appear to be on any PPI on MAR  Normocytic Anemia -Patient's Hb/Hct went from 11.5/37.8 -> 11.0/36.4 -> 11.7/38.5 -Check Anemia Panel in the AM  -Continue to Monitor for S/Sx of Bleeding; Currently no overt Bleeding noted -Repeat CBC in the AM  COPD with Chronic Respiratory Failure with Hypoxia on Chronic O2 (6L) -Currently not in exacerbation -Wearing 7 Liters of Supplemental O2 via [place] -C/w Trelogy Subsitution with Breo Ellipta and Incruse Ellipta -C/w DuoNeb 3 mL q6hprn Wheezing and  SOB  HLD -C/w home Rosuvastatin 20 mg qHS  GOC: DNR, poA  DVT prophylaxis: Enoxaparin 40 mg sq q24h Code Status: DO NOT RESUSCITATE Family Communication: Discussed with Spouse at bedside  Disposition Plan: Anticipating D/C Home with Palliative Care Services in the Next 24-48 hours  Status is: Inpatient  Remains inpatient appropriate because:IV treatments appropriate due to intensity of illness or inability to take PO and Inpatient level of care appropriate due to severity of illness   Dispo: The patient is from: Home              Anticipated d/c is to: Home              Anticipated d/c date is: 1 day              Patient currently is not medically stable to d/c.   Consultants:   None   Procedures: None  Antimicrobials:  Anti-infectives (From admission, onward)   Start     Dose/Rate Route Frequency Ordered Stop   03/14/20 1200  cefTRIAXone (ROCEPHIN) 2 g in sodium chloride 0.9 % 100 mL IVPB        2 g 200 mL/hr over 30 Minutes Intravenous Every 24 hours 03/14/20 1013 03/19/20 1159   03/14/20 1200  azithromycin (ZITHROMAX) 500 mg in sodium chloride 0.9 % 250 mL IVPB        500 mg 250 mL/hr over 60 Minutes Intravenous Every 24 hours 03/14/20 1013 03/19/20 1159   03/14/20 0015  ceFEPIme (MAXIPIME) 2 g in sodium chloride 0.9 % 100 mL IVPB        2 g 200 mL/hr over 30 Minutes Intravenous  Once 03/14/20 0000 03/14/20 0056   03/14/20 0015  metroNIDAZOLE (FLAGYL) IVPB 500 mg  Status:  Discontinued        500 mg 100 mL/hr over 60 Minutes Intravenous  Once 03/14/20 0000 03/14/20 0039   03/14/20 0015  vancomycin (VANCOCIN) IVPB 1000 mg/200 mL premix        1,000 mg 200 mL/hr over 60 Minutes Intravenous  Once 03/14/20 0000 03/14/20 0122        Subjective: In and examined at bedside and she states she is doing okay and a little bit better breathing wise but still not back to her baseline.  She wears normally 6 L at home and is on 7 L today.  Has been coughing up some.  No chest  pain.  No other concerns or complaints at this time and states that she had a good bowel movement yesterday.    Objective: Vitals:   03/14/20 2115 03/15/20 0528 03/15/20 0737 03/15/20 0751  BP: 133/78 130/78    Pulse: 83 86    Resp: 16 18    Temp: 98.6 F (37 C) 98.5 F (36.9 C)    TempSrc: Oral Oral    SpO2: 94% 93% 94% 94%  Weight:      Height:  Intake/Output Summary (Last 24 hours) at 03/15/2020 1406 Last data filed at 03/15/2020 1300 Gross per 24 hour  Intake 480 ml  Output 3500 ml  Net -3020 ml   Filed Weights   03/13/20 2343 03/14/20 0937  Weight: 67 kg 69.7 kg    Examination: Physical Exam:  Constitutional: Overweight Caucasian female currently in NAD and appears calm and comfortable Eyes: Lids and conjunctivae normal, sclerae anicteric  ENMT: External Ears, Nose appear normal. Grossly normal hearing.  Neck: Appears normal, supple, no cervical masses, normal ROM, no appreciable thyromegaly; no JVD Respiratory: Diminished to auscultation bilaterally worse on the right side compared to left with some rhonchi and some mild crackles.  No appreciable wheezing or rales noted. Normal respiratory effort and patient is not tachypenic. No accessory muscle use.  Wearing 7 L of supplemental oxygen via nasal cannula Cardiovascular: RRR, no murmurs / rubs / gallops. S1 and S2 auscultated. No extremity edema.  Abdomen: Soft, non-tender, non-distended. Bowel sounds positive.  GU: Deferred. Musculoskeletal: No clubbing / cyanosis of digits/nails. No joint deformity upper and lower extremities.  Skin: No rashes, lesions, ulcers on limited skin evaluation. No induration; Warm and dry.  Neurologic: CN 2-12 grossly intact with no focal deficits. Romberg sign and cerebellar reflexes not assessed.  Psychiatric: Normal judgment and insight. Alert and awake. Normal mood and appropriate affect.   Data Reviewed: I have personally reviewed following labs and imaging  studies  CBC: Recent Labs  Lab 03/14/20 0000 03/14/20 1056 03/15/20 0633  WBC 17.0* 14.8* 11.7*  NEUTROABS 14.5*  --   --   HGB 11.5* 11.0* 11.7*  HCT 37.8 36.4 38.5  MCV 98.7 98.9 98.2  PLT 461* 390 027*   Basic Metabolic Panel: Recent Labs  Lab 03/14/20 0000 03/14/20 1056 03/15/20 0633  NA 136  --  139  K 3.8  --  3.3*  CL 102  --  103  CO2 25  --  25  GLUCOSE 291*  --  211*  BUN 26*  --  10  CREATININE 0.63 0.53 0.55  CALCIUM 8.6*  --  9.1  MG  --   --  1.7  PHOS  --   --  2.7   GFR: Estimated Creatinine Clearance: 55.5 mL/min (by C-G formula based on SCr of 0.55 mg/dL). Liver Function Tests: Recent Labs  Lab 03/14/20 0000 03/15/20 0633  AST 17 23  ALT 19 26  ALKPHOS 55 50  BILITOT 0.4 0.6  PROT 7.0 6.8  ALBUMIN 3.6 3.3*   No results for input(s): LIPASE, AMYLASE in the last 168 hours. No results for input(s): AMMONIA in the last 168 hours. Coagulation Profile: Recent Labs  Lab 03/14/20 0000  INR 1.1   Cardiac Enzymes: No results for input(s): CKTOTAL, CKMB, CKMBINDEX, TROPONINI in the last 168 hours. BNP (last 3 results) No results for input(s): PROBNP in the last 8760 hours. HbA1C: Recent Labs    03/14/20 1056  HGBA1C 8.7*   CBG: Recent Labs  Lab 03/14/20 1213 03/14/20 2117 03/15/20 0740 03/15/20 1218  GLUCAP 207* 302* 191* 305*   Lipid Profile: No results for input(s): CHOL, HDL, LDLCALC, TRIG, CHOLHDL, LDLDIRECT in the last 72 hours. Thyroid Function Tests: No results for input(s): TSH, T4TOTAL, FREET4, T3FREE, THYROIDAB in the last 72 hours. Anemia Panel: No results for input(s): VITAMINB12, FOLATE, FERRITIN, TIBC, IRON, RETICCTPCT in the last 72 hours. Sepsis Labs: Recent Labs  Lab 03/14/20 0000  LATICACIDVEN 1.6    Recent Results (from the past  240 hour(s))  Urine culture     Status: Abnormal   Collection Time: 03/14/20 12:00 AM   Specimen: In/Out Cath Urine  Result Value Ref Range Status   Specimen Description    Final    IN/OUT CATH URINE Performed at [facility] [address] [place], [place] [address]    Special Requests   Final    NONE Performed at [facility], [place] [address], [place], [place] [address]    Culture MULTIPLE SPECIES PRESENT, SUGGEST RECOLLECTION (A)  Final   Report Status 03/15/2020 FINAL  Final  Blood Culture (routine x 2)     Status: None (Preliminary result)   Collection Time: 03/14/20 12:05 AM   Specimen: BLOOD  Result Value Ref Range Status   Specimen Description   Final    BLOOD BLOOD RIGHT FOREARM Performed at [facility] [address] [place], [place] [address]    Special Requests   Final    BOTTLES DRAWN AEROBIC AND ANAEROBIC Blood Culture results may not be optimal due to an excessive volume of blood received in culture bottles Performed at [facility] [address], [place], [place] [address]    Culture   Final    NO GROWTH 1 DAY Performed at [facility], [place] [address], [place], [place] [address]    Report Status PENDING  Incomplete  Blood Culture (routine x 2)     Status: None (Preliminary result)   Collection Time: 03/14/20 12:15 AM   Specimen: BLOOD  Result Value Ref Range Status   Specimen Description   Final    BLOOD BLOOD RIGHT ARM Performed at [facility] [address], [place], [place] [address]    Special Requests   Final    BOTTLES DRAWN AEROBIC AND ANAEROBIC Blood Culture adequate volume Performed at [facility] [address], [place], [place] [address]    Culture   Final    NO GROWTH 1 DAY Performed at [facility], [place] [address] [place], [place] [address]    Report Status PENDING  Incomplete  Resp Panel by RT-PCR (Flu A&B, Covid) Nasopharyngeal Swab     Status: None   Collection Time: 03/14/20 12:32 AM   Specimen: Nasopharyngeal Swab; Nasopharyngeal(NP) swabs in vial transport medium  Result  Value Ref Range Status   SARS Coronavirus 2 by RT PCR NEGATIVE NEGATIVE Final    Comment: (NOTE) SARS-CoV-2 target nucleic acids are NOT DETECTED.  The SARS-CoV-2 RNA is generally detectable in upper respiratory specimens during the acute phase of infection. The lowest concentration of SARS-CoV-2 viral copies this assay can detect is 138 copies/mL. A negative result does not preclude SARS-Cov-2 infection and should not be used as the sole basis for treatment or other patient management decisions. A negative result may occur with  improper specimen collection/handling, submission of specimen other than nasopharyngeal swab, presence of viral mutation(s) within the areas targeted by this assay, and inadequate number of viral copies(<138 copies/mL). A negative result must be combined with clinical observations, patient history, and epidemiological information. The expected result is Negative.  Fact Sheet for Patients:  EntrepreneurPulse.com.au  Fact Sheet for Healthcare Providers:  IncredibleEmployment.be  This test is no t yet approved or cleared by the [place] FDA and  has been authorized for detection and/or diagnosis of SARS-CoV-2 by FDA under an Emergency Use Authorization (EUA). This EUA will remain  in effect (meaning this test  can be used) for the duration of the COVID-19 declaration under Section 564(b)(1) of the Act, 21 U.S.C.section 360bbb-3(b)(1), unless the authorization is terminated  or revoked sooner.       Influenza A by PCR NEGATIVE NEGATIVE Final   Influenza B by PCR NEGATIVE NEGATIVE Final    Comment: (NOTE) The Xpert Xpress SARS-CoV-2/FLU/RSV plus assay is intended as an aid in the diagnosis of influenza from Nasopharyngeal swab specimens and should not be used as a sole basis for treatment. Nasal washings and aspirates are unacceptable for Xpert Xpress SARS-CoV-2/FLU/RSV testing.  Fact Sheet for  Patients: EntrepreneurPulse.com.au  Fact Sheet for Healthcare Providers: IncredibleEmployment.be  This test is not yet approved or cleared by the [place] FDA and has been authorized for detection and/or diagnosis of SARS-CoV-2 by FDA under an Emergency Use Authorization (EUA). This EUA will remain in effect (meaning this test can be used) for the duration of the COVID-19 declaration under Section 564(b)(1) of the Act, 21 U.S.C. section 360bbb-3(b)(1), unless the authorization is terminated or revoked.  Performed at [facility], [place] [address] [place], [place] [address]     RN Pressure Injury Documentation:     Estimated body mass index is 26.38 kg/m as calculated from the following:   Height as of this encounter: 5\' 4"  (1.626 m).   Weight as of this encounter: 69.7 kg.  Malnutrition Type:   Malnutrition Characteristics:   Nutrition Interventions:    Radiology Studies: DG Chest Port 1 View  Result Date: 03/14/2020 CLINICAL DATA:  Fever and weakness EXAM: PORTABLE CHEST 1 VIEW COMPARISON:  03/12/2020 FINDINGS: Cardiac shadow is within normal limits. Aortic calcifications are again seen. Developing right basilar infiltrate is seen new from the prior exam. No other focal infiltrate is seen. No bony abnormality is noted. IMPRESSION: New developing right basilar infiltrate. Electronically Signed   By: Inez Catalina M.D.   On: 03/14/2020 00:16   Scheduled Meds: . acetaZOLAMIDE  250 mg Oral Daily  . aspirin EC  81 mg Oral Daily  . cholecalciferol  10,000 Units Oral Daily  . enoxaparin (LOVENOX) injection  40 mg Subcutaneous Q24H  . escitalopram  10 mg Oral Daily  . umeclidinium bromide  1 puff Inhalation Daily   And  . fluticasone furoate-vilanterol  1 puff Inhalation Daily  . insulin aspart  0-9 Units Subcutaneous TID WC  . losartan  50 mg Oral Daily  . multivitamin with minerals  1 tablet Oral Daily  . potassium  chloride  40 mEq Oral BID  . rosuvastatin  20 mg Oral QHS   Continuous Infusions: . azithromycin 500 mg (03/15/20 1315)  . cefTRIAXone (ROCEPHIN)  IV 2 g (03/15/20 1230)    LOS: 1 day   Kerney Elbe, DO [facility] PAGER is on [place]  If 7PM-7AM, please contact night-coverage [email or web address]

## 2020-03-15 NOTE — Evaluation (Signed)
Occupational Therapy Evaluation Patient Details Name: Briana Barr MRN: [id number] DOB: [birth year] Today's Date: 03/15/2020    History of Present Illness Briana Barr is a 79 y.o. female with medical history significant of COPD, GERD, HLD who presented to ER with fevers. Admitted for RLL pneumonia.   Clinical Impression   [name] is a 79 year old woman admitted to hospital for pneumonia who presents with decreased activity tolerance, currently on 5.5 liters of oxygen and complaints of foot pain. On evaluation patient able to perform bed mobility with supervision, initial sit to stand with min assist, BSC transfers with min guard and use of RW and min guard for standing with ADLs. Patient reports her most limiting factor is right foot pain and that her breathing is worse than normal. Patient will benefit from skilled OT services while in hospital to improve deficits and learn compensatory strategies as needed in order to return home at discharge.      Follow Up Recommendations  No OT follow up    Equipment Recommendations  None recommended by OT    Recommendations for Other Services       Precautions / Restrictions Precautions Precautions: Fall Precaution Comments: R foot pain from fall  (negative imaging) Restrictions Weight Bearing Restrictions: No      Mobility Bed Mobility Overal bed mobility: Needs Assistance Bed Mobility: Supine to Sit;Sit to Supine     Supine to sit: Supervision;HOB elevated Sit to supine: Supervision        Transfers Overall transfer level: Needs assistance Equipment used: Rolling walker (2 wheeled) Transfers: Sit to/from [facility] Sit to Stand: Min assist;From elevated surface Stand pivot transfers: Min guard       General transfer comment: Initially min assist from bed height (elevated slightly) then min guard to stand from [facility]. Min guard for transfers. Limited by foot pain and oxygen.    Balance Overall balance  assessment: Mild deficits observed, not formally tested                                         ADL either performed or assessed with clinical judgement   ADL Overall ADL's : Needs assistance/impaired Eating/Feeding: Independent   Grooming: Set up;Sitting   Upper Body Bathing: Set up;Sitting   Lower Body Bathing: Min guard;Sit to/from stand   Upper Body Dressing : Set up;Sitting   Lower Body Dressing: Set up Lower Body Dressing Details (indicate cue type and reason): able to don socks Toilet Transfer: BSC;Stand-pivot;RW Toilet Transfer Details (indicate cue type and reason): able to transfer to [facility] with min guard and management of lines/leads Toileting- Clothing Manipulation and Hygiene: Min guard;Sit to/from stand       Functional mobility during ADLs: Min guard;Minimal assistance;Rolling walker       Vision Patient Visual Report: No change from baseline       Perception     Praxis      Pertinent Vitals/Pain Pain Assessment: Faces Faces Pain Scale: Hurts little more Pain Location: R foot Pain Descriptors / Indicators: Grimacing;Guarding Pain Intervention(s): Limited activity within patient's tolerance     Hand Dominance Right   Extremity/Trunk Assessment Upper Extremity Assessment Upper Extremity Assessment: Overall WFL for tasks assessed   Lower Extremity Assessment Lower Extremity Assessment: Defer to PT evaluation   Cervical / Trunk Assessment Cervical / Trunk Assessment: Kyphotic   Communication Communication Communication: [facility]  Cognition Arousal/Alertness: Awake/alert Behavior During Therapy: WFL for tasks assessed/performed Overall Cognitive Status: History of cognitive impairments - at baseline                                 General Comments: Appears to have some short term memory deficits. Patient's son in room and corrects his mother but doesn't state confusion/memory deficits as a new problem.   General  Comments       Exercises     Shoulder Instructions      Home Living Family/patient expects to be discharged to:: Private residence Living Arrangements: Children Available Help at Discharge: Family;Available 24 hours/day Type of Home: Mobile home Home Access: Ramped entrance     Home Layout: One level     Bathroom Shower/Tub: Teacher, early years/pre: Standard     Home Equipment: Environmental consultant - 2 wheels;Walker - 4 wheels;Cane - single point;Wheelchair - manual;Hospital bed;Other (comment);Tub bench;Bedside commode          Prior Functioning/Environment Level of Independence: Independent with assistive device(s)        Comments: Typically uses cane in home but has been using RW due to foot pain.        OT Problem List: Decreased activity tolerance;Impaired balance (sitting and/or standing);Pain;Cardiopulmonary status limiting activity;Decreased knowledge of use of DME or AE;Decreased cognition      OT Treatment/Interventions: Self-care/ADL training;Therapeutic exercise;DME and/or AE instruction;Therapeutic activities;Balance training;Patient/family education    OT Goals(Current goals can be found in the care plan section) Acute Rehab OT Goals Patient Stated Goal: For foot to get better OT Goal Formulation: With patient Time For Goal Achievement: 03/29/20 Potential to Achieve Goals: Good  OT Frequency: Min 2X/week   Barriers to D/C:            Co-evaluation              AM-PAC OT "6 Clicks" Daily Activity     Outcome Measure Help from another person eating meals?: None Help from another person taking care of personal grooming?: A Little Help from another person toileting, which includes using toliet, bedpan, or urinal?: A Little Help from another person bathing (including washing, rinsing, drying)?: A Little Help from another person to put on and taking off regular upper body clothing?: A Little Help from another person to put on and taking off  regular lower body clothing?: A Little 6 Click Score: 19   End of Session Equipment Utilized During Treatment: Rolling walker Nurse Communication: Mobility status  Activity Tolerance: Patient limited by pain Patient left: in bed;with call bell/phone within reach;with family/visitor present  OT Visit Diagnosis: Unsteadiness on feet (R26.81);Pain Pain - Right/Left: Right Pain - part of body: Ankle and joints of foot                Time: 9292-4462 OT Time Calculation (min): 20 min Charges:  OT General Charges $OT Visit: 1 Visit OT Evaluation $OT Eval Low Complexity: 1 Low  Dawne Casali, OTR/L [place]  Office [phone] Pager: [place] 03/15/2020, 4:08 PM

## 2020-03-16 ENCOUNTER — Inpatient Hospital Stay (HOSPITAL_COMMUNITY): Payer: Medicare Other

## 2020-03-16 DIAGNOSIS — E119 Type 2 diabetes mellitus without complications: Secondary | ICD-10-CM

## 2020-03-16 DIAGNOSIS — J189 Pneumonia, unspecified organism: Secondary | ICD-10-CM | POA: Diagnosis not present

## 2020-03-16 DIAGNOSIS — E876 Hypokalemia: Secondary | ICD-10-CM | POA: Diagnosis not present

## 2020-03-16 DIAGNOSIS — I10 Essential (primary) hypertension: Secondary | ICD-10-CM

## 2020-03-16 DIAGNOSIS — Z9981 Dependence on supplemental oxygen: Secondary | ICD-10-CM

## 2020-03-16 DIAGNOSIS — J9611 Chronic respiratory failure with hypoxia: Secondary | ICD-10-CM

## 2020-03-16 LAB — RETICULOCYTES
Immature Retic Fract: 27.7 % — ABNORMAL HIGH (ref 2.3–15.9)
RBC.: 3.9 MIL/uL (ref 3.87–5.11)
Retic Count, Absolute: 81.5 10*3/uL (ref 19.0–186.0)
Retic Ct Pct: 2.1 % (ref 0.4–3.1)

## 2020-03-16 LAB — CBC WITH DIFFERENTIAL/PLATELET
Abs Immature Granulocytes: 0.11 10*3/uL — ABNORMAL HIGH (ref 0.00–0.07)
Basophils Absolute: 0.1 10*3/uL (ref 0.0–0.1)
Basophils Relative: 1 %
Eosinophils Absolute: 0.5 10*3/uL (ref 0.0–0.5)
Eosinophils Relative: 4 %
HCT: 38.5 % (ref 36.0–46.0)
Hemoglobin: 11.6 g/dL — ABNORMAL LOW (ref 12.0–15.0)
Immature Granulocytes: 1 %
Lymphocytes Relative: 7 %
Lymphs Abs: 0.8 10*3/uL (ref 0.7–4.0)
MCH: 29.5 pg (ref 26.0–34.0)
MCHC: 30.1 g/dL (ref 30.0–36.0)
MCV: 98 fL (ref 80.0–100.0)
Monocytes Absolute: 1.1 10*3/uL — ABNORMAL HIGH (ref 0.1–1.0)
Monocytes Relative: 10 %
Neutro Abs: 8.2 10*3/uL — ABNORMAL HIGH (ref 1.7–7.7)
Neutrophils Relative %: 77 %
Platelets: 464 10*3/uL — ABNORMAL HIGH (ref 150–400)
RBC: 3.93 MIL/uL (ref 3.87–5.11)
RDW: 13.2 % (ref 11.5–15.5)
WBC: 10.7 10*3/uL — ABNORMAL HIGH (ref 4.0–10.5)
nRBC: 0 % (ref 0.0–0.2)

## 2020-03-16 LAB — COMPREHENSIVE METABOLIC PANEL
ALT: 34 U/L (ref 0–44)
AST: 26 U/L (ref 15–41)
Albumin: 3.7 g/dL (ref 3.5–5.0)
Alkaline Phosphatase: 63 U/L (ref 38–126)
Anion gap: 11 (ref 5–15)
BUN: 17 mg/dL (ref 8–23)
CO2: 25 mmol/L (ref 22–32)
Calcium: 8.9 mg/dL (ref 8.9–10.3)
Chloride: 101 mmol/L (ref 98–111)
Creatinine, Ser: 0.51 mg/dL (ref 0.44–1.00)
GFR, Estimated: >60 mL/min (ref >60)
Glucose, Bld: 206 mg/dL — ABNORMAL HIGH (ref 70–99)
Potassium: 3.9 mmol/L (ref 3.5–5.1)
Sodium: 137 mmol/L (ref 135–145)
Total Bilirubin: 0.5 mg/dL (ref 0.3–1.2)
Total Protein: 7.4 g/dL (ref 6.5–8.1)

## 2020-03-16 LAB — FOLATE: Folate: 21 ng/mL (ref >5.9)

## 2020-03-16 LAB — IRON AND TIBC
Iron: 41 ug/dL (ref 28–170)
Saturation Ratios: 15 % (ref 10.4–31.8)
TIBC: 281 ug/dL (ref 250–450)
UIBC: 240 ug/dL

## 2020-03-16 LAB — PHOSPHORUS: Phosphorus: 2.5 mg/dL (ref 2.5–4.6)

## 2020-03-16 LAB — GLUCOSE, CAPILLARY
Glucose-Capillary: 174 mg/dL — ABNORMAL HIGH (ref 70–99)
Glucose-Capillary: 210 mg/dL — ABNORMAL HIGH (ref 70–99)
Glucose-Capillary: 240 mg/dL — ABNORMAL HIGH (ref 70–99)
Glucose-Capillary: 288 mg/dL — ABNORMAL HIGH (ref 70–99)
Glucose-Capillary: 420 mg/dL — ABNORMAL HIGH (ref 70–99)

## 2020-03-16 LAB — VITAMIN B12: Vitamin B-12: 632 pg/mL (ref 180–914)

## 2020-03-16 LAB — MAGNESIUM: Magnesium: 2.1 mg/dL (ref 1.7–2.4)

## 2020-03-16 LAB — FERRITIN: Ferritin: 111 ng/mL (ref 11–307)

## 2020-03-16 MED ORDER — GUAIFENESIN ER 600 MG PO TB12: 1200.0000 mg | ORAL_TABLET | Freq: Two times a day (BID) | ORAL | 0 refills | Status: AC

## 2020-03-16 MED ORDER — CEFDINIR 300 MG PO CAPS: 300.0000 mg | ORAL_CAPSULE | Freq: Two times a day (BID) | ORAL | 0 refills | Status: AC

## 2020-03-16 MED ORDER — AZITHROMYCIN 250 MG PO TABS: 500.0000 mg | ORAL_TABLET | Freq: Once | ORAL | Status: DC

## 2020-03-16 MED ORDER — CEFDINIR 300 MG PO CAPS
300.0000 mg | ORAL_CAPSULE | Freq: Two times a day (BID) | ORAL | Status: DC
  Filled 2020-03-16: qty 1

## 2020-03-16 NOTE — Progress Notes (Signed)
AVS reviewed and questions answered.  Patient alert and oriented, skin warm and dry.  Transported to main lobby and care transferred to family/friend without incident.  

## 2020-03-16 NOTE — Discharge Summary (Addendum)
Physician Discharge Summary  Der Morton TUU:[id number] DOB: Jul 01, 1940 DOA: 03/13/2020  PCP: [name], MD  Admit date: 03/13/2020 Discharge date: 03/16/2020  Admitted From: Home Disposition:  Discharged to home  Recommendations for Outpatient Follow-up:  1. Follow up with PCP in 1-2 weeks  Home Health: N/A  Equipment/Devices: N/A   Discharge Condition: Stable  CODE STATUS: DNR   Brief/Interim Summary: Briana Mahoneyis a 79 y.o.femalewith medical history significant ofCOPD, GERD, HLD. Presenting with fevers. Patient is poor historian. History from son. Patient had a fall on Monday. She was seen by her PCP and everything seemed ok, so she went home. She was in her normal state of health until yesterday afternoon. Family noted that she was shivering. Her son took her temp. The first temp was 99.8, but as he continued to check over the course of the evening, it rose to 101.8 and then 104.8. They spoke with her PCP who ordered some abx for her took. When they tried to pick the abx up, the family noticed that he temp was remaining high, she was weak, and she was having nausea and vomiting. They were concerned and had her transported to the ED.   12/4: She was treated to CAP. And is on Day 3 of rocephin and azithromycin. Her white count has improved to 10.7 and she is down to her baseline O2 use. She has been free of fevers and feels much better. She will finish her azithromycin with a 500mg  PO dose prior to discharge. We will transition her to cefedinir 300mg  BID to finish out 3 more days (first dose this morning). She will not take the remainder of her levaquin. She has had some high glucose readings. She has been given sliding scale insulin. She will need to follow up on her DM control with her PCP. She was reviewed by PT for gait disturbance. They recommend HHPT. Order placed.  She is ok for discharge. Discharge plan and instructions explain in detail to pt and son. They have voiced  understanding and agreement.    Discharge Diagnoses: RLL CAP DM2 HTN Hypokalemia Thrombocytosis GERD Hx of Barrett's Esophagus/Duodenal Ulcer Normocytic anemia COPD w/ chronic respiratory failure w/ hypoxia/Chronic 6L O2 use HLD Gait disturbance  Discharge Instructions  Follow up with PCP in 1 week.   Allergies as of 03/16/2020      Reactions   Vioxx [rofecoxib] Swelling      Medication List    STOP taking these medications   levofloxacin 500 MG tablet Commonly known as: LEVAQUIN     TAKE these medications   acetaZOLAMIDE 250 MG tablet Commonly known as: DIAMOX TAKE 1 TABLET BY MOUTH EVERY DAY   albuterol 108 (90 Base) MCG/ACT inhaler Commonly known as: ProAir HFA Inhale 2 puffs into the lungs every 6 (six) hours as needed for wheezing or shortness of breath.   aspirin 81 MG tablet Take 81 mg by mouth daily.   cefdinir 300 MG capsule Commonly known as: OMNICEF Take 1 capsule (300 mg total) by mouth every 12 (twelve) hours for 5 doses. Start the evening of 03/16/20   Centrum Silver 50+Women Tabs Take 1 tablet by mouth daily.   ELDERBERRY PO Take 1 each by mouth daily.   escitalopram 10 MG tablet Commonly known as: LEXAPRO TAKE 1 TABLET BY MOUTH EVERY DAY   glimepiride 1 MG tablet Commonly known as: AMARYL TAKE 1 TABLET BY MOUTH EVERY DAY IN THE MORNING   guaiFENesin 600 MG 12 hr tablet Commonly known  as: MUCINEX Take 2 tablets (1,200 mg total) by mouth 2 (two) times daily for 3 days.   HYDROcodone-acetaminophen 5-325 MG tablet Commonly known as: Norco Take 1 tablet by mouth 4 (four) times daily. What changed:   when to take this  reasons to take this   ipratropium-albuterol 0.5-2.5 (3) MG/3ML Soln Commonly known as: DUONEB Take 3 mLs by nebulization every 6 (six) hours as needed. What changed: reasons to take this   losartan 50 MG tablet Commonly known as: COZAAR Take 1 tablet (50 mg total) by mouth daily.   metFORMIN 500 MG  tablet Commonly known as: GLUCOPHAGE Take 1 tablet (500 mg total) by mouth 2 (two) times daily with a meal.   omeprazole 20 MG capsule Commonly known as: PRILOSEC TAKE 1 CAPSULE BY MOUTH TWICE A DAY BEFORE A MEAL What changed:   how much to take  how to take this  when to take this  additional instructions   rosuvastatin 20 MG tablet Commonly known as: CRESTOR TAKE 1 TABLET BY MOUTH EVERYDAY AT BEDTIME What changed:   how much to take  how to take this  when to take this  additional instructions   Trelegy Ellipta 100-62.5-25 MCG/INH Aepb Generic drug: Fluticasone-Umeclidin-Vilant Inhale 1 Inhaler into the lungs daily.   Vitamin D3 250 MCG (10000 UT) capsule Take 1 capsule (10,000 Units total) by mouth daily.       Allergies  Allergen Reactions  . Vioxx [Rofecoxib] Swelling    Consultations:  None  Procedures/Studies: DG Chest 2 View  Result Date: 03/12/2020 CLINICAL DATA:  Cough. EXAM: CHEST - 2 VIEW COMPARISON:  November 19, 2019. FINDINGS: The heart size and mediastinal contours are within normal limits. Both lungs are clear. No pneumothorax or pleural effusion is noted. The visualized skeletal structures are unremarkable. IMPRESSION: No active cardiopulmonary disease. Electronically Signed   By: Marijo Conception M.D.   On: 03/12/2020 20:47   DG CHEST PORT 1 VIEW  Result Date: 03/16/2020 CLINICAL DATA:  Adult respiratory distress syndrome EXAM: PORTABLE CHEST 1 VIEW COMPARISON:  Chest x-rays dated 03/14/2020 and 03/12/2020. FINDINGS: Heart size and mediastinal contours are stable. Coarse lung markings again seen bilaterally suggesting chronic interstitial lung disease/fibrosis. Lungs are at least mildly hyperexpanded indicating COPD. No pleural effusion or pneumothorax is seen. No confluent opacity to suggest superimposed pneumonia. Aortic atherosclerosis. Osseous structures about the chest are unremarkable. IMPRESSION: 1. No active cardiopulmonary disease. No  evidence of pneumonia or pulmonary edema. 2. COPD. 3. Probable chronic interstitial lung disease/fibrosis. 4. Aortic atherosclerosis. Electronically Signed   By: Franki Cabot M.D.   On: 03/16/2020 05:51   DG Chest Port 1 View  Result Date: 03/14/2020 CLINICAL DATA:  Fever and weakness EXAM: PORTABLE CHEST 1 VIEW COMPARISON:  03/12/2020 FINDINGS: Cardiac shadow is within normal limits. Aortic calcifications are again seen. Developing right basilar infiltrate is seen new from the prior exam. No other focal infiltrate is seen. No bony abnormality is noted. IMPRESSION: New developing right basilar infiltrate. Electronically Signed   By: Inez Catalina M.D.   On: 03/14/2020 00:16   DG Abd 2 Views  Result Date: 03/12/2020 CLINICAL DATA:  Right upper quadrant abdominal pain. EXAM: ABDOMEN - 2 VIEW COMPARISON:  None. FINDINGS: The bowel gas pattern is normal. Status post cholecystectomy. There is no evidence of free air. No radio-opaque calculi or other significant radiographic abnormality is seen. IMPRESSION: Negative. Electronically Signed   By: Marijo Conception M.D.   On: 03/12/2020 20:48  DG Foot Complete Right  Result Date: 03/12/2020 CLINICAL DATA:  Right foot pain. EXAM: RIGHT FOOT COMPLETE - 3+ VIEW COMPARISON:  None. FINDINGS: There is no evidence of fracture or dislocation. There is no evidence of arthropathy or other focal bone abnormality. Soft tissues are unremarkable. IMPRESSION: Negative. Electronically Signed   By: Marijo Conception M.D.   On: 03/12/2020 20:50      Subjective: "My breathing is better."  Discharge Exam: Vitals:   03/16/20 0353 03/16/20 0820  BP: (!) 145/93   Pulse: 84   Resp: 18   Temp: 98.4 F (36.9 C)   SpO2: 94% 95%   Vitals:   03/15/20 1421 03/15/20 2009 03/16/20 0353 03/16/20 0820  BP: 140/83 (!) 151/82 (!) 145/93   Pulse: 84 79 84   Resp: 16 18 18    Temp: 99.5 F (37.5 C) 98.8 F (37.1 C) 98.4 F (36.9 C)   TempSrc: Oral Oral Oral   SpO2: 96% 94%  94% 95%  Weight:      Height:        General: 79 y.o. female resting in bed in NAD Eyes: PERRL, normal sclera ENMT: Nares patent w/o discharge, orophaynx clear, dentition normal, ears w/o discharge/lesions/ulcers Neck: Supple, trachea midline Cardiovascular: RRR, +S1, S2, no m/g/r, equal pulses throughout Respiratory: decreased at bases, no w/r/r, normal WOB on 5L [place] GI: BS+, NDNT, no masses noted, no organomegaly noted MSK: No e/c/c Skin: No rashes, bruises, ulcerations noted Neuro: A&O x 3, no focal deficits Psyc: Appropriate interaction and affect, calm/cooperative   The results of significant diagnostics from this hospitalization (including imaging, microbiology, ancillary and laboratory) are listed below for reference.     Microbiology: Recent Results (from the past 240 hour(s))  Urine culture     Status: Abnormal   Collection Time: 03/14/20 12:00 AM   Specimen: In/Out Cath Urine  Result Value Ref Range Status   Specimen Description   Final    IN/OUT CATH URINE Performed at [facility], [place] [address], [place], [place] [address]    Special Requests   Final    NONE Performed at [facility], [place] [address], [place], [place] [address]    Culture MULTIPLE SPECIES PRESENT, SUGGEST RECOLLECTION (A)  Final   Report Status 03/15/2020 FINAL  Final  Blood Culture (routine x 2)     Status: None (Preliminary result)   Collection Time: 03/14/20 12:05 AM   Specimen: BLOOD  Result Value Ref Range Status   Specimen Description   Final    BLOOD BLOOD RIGHT FOREARM Performed at [place] [address], [place], [place] [address]    Special Requests   Final    BOTTLES DRAWN AEROBIC AND ANAEROBIC Blood Culture results may not be optimal due to an excessive volume of blood received in culture bottles Performed at [facility] [address], [place], [place] [address]    Culture   Final    NO GROWTH  2 DAYS Performed at [name] [address], [place], [place] [address]    Report Status PENDING  Incomplete  Blood Culture (routine x 2)     Status: None (Preliminary result)   Collection Time: 03/14/20 12:15 AM   Specimen: BLOOD  Result Value Ref Range Status   Specimen Description   Final    BLOOD BLOOD RIGHT ARM Performed at [place] [address] [place],  [address]    Special  Requests   Final    BOTTLES DRAWN AEROBIC AND ANAEROBIC Blood Culture adequate volume Performed at [facility] [address] [place], [place] [address]    Culture   Final    NO GROWTH 2 DAYS Performed at [facility] [address], [place], [place] [address]    Report Status PENDING  Incomplete  Resp Panel by RT-PCR (Flu A&B, Covid) Nasopharyngeal Swab     Status: None   Collection Time: 03/14/20 12:32 AM   Specimen: Nasopharyngeal Swab; Nasopharyngeal(NP) swabs in vial transport medium  Result Value Ref Range Status   SARS Coronavirus 2 by RT PCR NEGATIVE NEGATIVE Final    Comment: (NOTE) SARS-CoV-2 target nucleic acids are NOT DETECTED.  The SARS-CoV-2 RNA is generally detectable in upper respiratory specimens during the acute phase of infection. The lowest concentration of SARS-CoV-2 viral copies this assay can detect is 138 copies/mL. A negative result does not preclude SARS-Cov-2 infection and should not be used as the sole basis for treatment or other patient management decisions. A negative result may occur with  improper specimen collection/handling, submission of specimen other than nasopharyngeal swab, presence of viral mutation(s) within the areas targeted by this assay, and inadequate number of viral copies(<138 copies/mL). A negative result must be combined with clinical observations, patient history, and epidemiological information. The expected result is Negative.  Fact Sheet for Patients:   EntrepreneurPulse.com.au  Fact Sheet for Healthcare Providers:  IncredibleEmployment.be  This test is no t yet approved or cleared by the [place] FDA and  has been authorized for detection and/or diagnosis of SARS-CoV-2 by FDA under an Emergency Use Authorization (EUA). This EUA will remain  in effect (meaning this test can be used) for the duration of the COVID-19 declaration under Section 564(b)(1) of the Act, 21 U.S.C.section 360bbb-3(b)(1), unless the authorization is terminated  or revoked sooner.       Influenza A by PCR NEGATIVE NEGATIVE Final   Influenza B by PCR NEGATIVE NEGATIVE Final    Comment: (NOTE) The Xpert Xpress SARS-CoV-2/FLU/RSV plus assay is intended as an aid in the diagnosis of influenza from Nasopharyngeal swab specimens and should not be used as a sole basis for treatment. Nasal washings and aspirates are unacceptable for Xpert Xpress SARS-CoV-2/FLU/RSV testing.  Fact Sheet for Patients: EntrepreneurPulse.com.au  Fact Sheet for Healthcare Providers: IncredibleEmployment.be  This test is not yet approved or cleared by the [place] FDA and has been authorized for detection and/or diagnosis of SARS-CoV-2 by FDA under an Emergency Use Authorization (EUA). This EUA will remain in effect (meaning this test can be used) for the duration of the COVID-19 declaration under Section 564(b)(1) of the Act, 21 U.S.C. section 360bbb-3(b)(1), unless the authorization is terminated or revoked.  Performed at [facility], [place] [address] [place], [place] [address]      Labs: BNP (last 3 results) Recent Labs    11/13/19 0454  BNP 034.7*   Basic Metabolic Panel: Recent Labs  Lab 03/14/20 0000 03/14/20 1056 03/15/20 0633 03/16/20 0709  NA 136  --  139 137  K 3.8  --  3.3* 3.9  CL 102  --  103 101  CO2 25  --  25 25  GLUCOSE 291*  --  211* 206*  BUN 26*   --  10 17  CREATININE 0.63 0.53 0.55 0.51  CALCIUM 8.6*  --  9.1 8.9  MG  --   --  1.7 2.1  PHOS  --   --  2.7 2.5   Liver Function Tests: Recent Labs  Lab 03/14/20 0000 03/15/20 0633 03/16/20 0709  AST 17 23 26   ALT 19 26 34  ALKPHOS 55 50 63  BILITOT 0.4 0.6 0.5  PROT 7.0 6.8 7.4  ALBUMIN 3.6 3.3* 3.7   No results for input(s): LIPASE, AMYLASE in the last 168 hours. No results for input(s): AMMONIA in the last 168 hours. CBC: Recent Labs  Lab 03/14/20 0000 03/14/20 1056 03/15/20 0633 03/16/20 0709  WBC 17.0* 14.8* 11.7* 10.7*  NEUTROABS 14.5*  --   --  8.2*  HGB 11.5* 11.0* 11.7* 11.6*  HCT 37.8 36.4 38.5 38.5  MCV 98.7 98.9 98.2 98.0  PLT 461* 390 405* 464*   Cardiac Enzymes: No results for input(s): CKTOTAL, CKMB, CKMBINDEX, TROPONINI in the last 168 hours. BNP: Invalid input(s): POCBNP CBG: Recent Labs  Lab 03/15/20 1737 03/15/20 2011 03/16/20 0004 03/16/20 0354 03/16/20 0758  GLUCAP 236* 237* 240* 210* 174*   D-Dimer No results for input(s): DDIMER in the last 72 hours. Hgb A1c Recent Labs    03/14/20 1056  HGBA1C 8.7*   Lipid Profile No results for input(s): CHOL, HDL, LDLCALC, TRIG, CHOLHDL, LDLDIRECT in the last 72 hours. Thyroid function studies No results for input(s): TSH, T4TOTAL, T3FREE, THYROIDAB in the last 72 hours.  Invalid input(s): FREET3 Anemia work up Recent Labs    03/16/20 0709  VITAMINB12 632  FOLATE 21.0  FERRITIN 111  TIBC 281  IRON 41  RETICCTPCT 2.1   Urinalysis    Component Value Date/Time   COLORURINE YELLOW 03/14/2020 0000   APPEARANCEUR HAZY (A) 03/14/2020 0000   LABSPEC 1.025 03/14/2020 0000   PHURINE 5.0 03/14/2020 0000   GLUCOSEU >=500 (A) 03/14/2020 0000   HGBUR NEGATIVE 03/14/2020 0000   BILIRUBINUR NEGATIVE 03/14/2020 0000   KETONESUR 5 (A) 03/14/2020 0000   PROTEINUR NEGATIVE 03/14/2020 0000   NITRITE NEGATIVE 03/14/2020 0000   LEUKOCYTESUR NEGATIVE 03/14/2020 0000   Sepsis Labs Invalid  input(s): PROCALCITONIN,  WBC,  LACTICIDVEN Microbiology Recent Results (from the past 240 hour(s))  Urine culture     Status: Abnormal   Collection Time: 03/14/20 12:00 AM   Specimen: In/Out Cath Urine  Result Value Ref Range Status   Specimen Description   Final    IN/OUT CATH URINE Performed at [facility]: [place], [place] [address], [place], [place] [address]    Special Requests   Final    NONE Performed at [facility], [place] [address] [place], [place] [address]    Culture MULTIPLE SPECIES PRESENT, SUGGEST RECOLLECTION (A)  Final   Report Status 03/15/2020 FINAL  Final  Blood Culture (routine x 2)     Status: None (Preliminary result)   Collection Time: 03/14/20 12:05 AM   Specimen: BLOOD  Result Value Ref Range Status   Specimen Description   Final    BLOOD BLOOD RIGHT FOREARM Performed at [facility] [address] [place], [place] [address]    Special Requests   Final    BOTTLES DRAWN AEROBIC AND ANAEROBIC Blood Culture results may not be optimal due to an excessive volume of blood received in culture bottles Performed at [place] [address] [place], [place] [address]    Culture   Final    NO GROWTH 2 DAYS Performed at [facility] [address], [place], [place] [address]    Report Status PENDING  Incomplete  Blood Culture (routine x  2)     Status: None (Preliminary result)   Collection Time: 03/14/20 12:15 AM   Specimen: BLOOD  Result Value Ref Range Status   Specimen Description   Final    BLOOD BLOOD RIGHT ARM Performed at [facility] [address], [place], [place] [address]    Special Requests   Final    BOTTLES DRAWN AEROBIC AND ANAEROBIC Blood Culture adequate volume Performed at [place] [address], [place], [place] [address]    Culture   Final    NO GROWTH 2 DAYS Performed at [facility] [address],  [place], [place] [address]    Report Status PENDING  Incomplete  Resp Panel by RT-PCR (Flu A&B, Covid) Nasopharyngeal Swab     Status: None   Collection Time: 03/14/20 12:32 AM   Specimen: Nasopharyngeal Swab; Nasopharyngeal(NP) swabs in vial transport medium  Result Value Ref Range Status   SARS Coronavirus 2 by RT PCR NEGATIVE NEGATIVE Final    Comment: (NOTE) SARS-CoV-2 target nucleic acids are NOT DETECTED.  The SARS-CoV-2 RNA is generally detectable in upper respiratory specimens during the acute phase of infection. The lowest concentration of SARS-CoV-2 viral copies this assay can detect is 138 copies/mL. A negative result does not preclude SARS-Cov-2 infection and should not be used as the sole basis for treatment or other patient management decisions. A negative result may occur with  improper specimen collection/handling, submission of specimen other than nasopharyngeal swab, presence of viral mutation(s) within the areas targeted by this assay, and inadequate number of viral copies(<138 copies/mL). A negative result must be combined with clinical observations, patient history, and epidemiological information. The expected result is Negative.  Fact Sheet for Patients:  EntrepreneurPulse.com.au  Fact Sheet for Healthcare Providers:  IncredibleEmployment.be  This test is no t yet approved or cleared by the [place] FDA and  has been authorized for detection and/or diagnosis of SARS-CoV-2 by FDA under an Emergency Use Authorization (EUA). This EUA will remain  in effect (meaning this test can be used) for the duration of the COVID-19 declaration under Section 564(b)(1) of the Act, 21 U.S.C.section 360bbb-3(b)(1), unless the authorization is terminated  or revoked sooner.       Influenza A by PCR NEGATIVE NEGATIVE Final   Influenza B by PCR NEGATIVE NEGATIVE Final    Comment: (NOTE) The Xpert Xpress SARS-CoV-2/FLU/RSV plus assay is  intended as an aid in the diagnosis of influenza from Nasopharyngeal swab specimens and should not be used as a sole basis for treatment. Nasal washings and aspirates are unacceptable for Xpert Xpress SARS-CoV-2/FLU/RSV testing.  Fact Sheet for Patients: EntrepreneurPulse.com.au  Fact Sheet for Healthcare Providers: IncredibleEmployment.be  This test is not yet approved or cleared by the [place] FDA and has been authorized for detection and/or diagnosis of SARS-CoV-2 by FDA under an Emergency Use Authorization (EUA). This EUA will remain in effect (meaning this test can be used) for the duration of the COVID-19 declaration under Section 564(b)(1) of the Act, 21 U.S.C. section 360bbb-3(b)(1), unless the authorization is terminated or revoked.  Performed at [facility], [place] [address], [place], [place] [address]      Time coordinating discharge: 45 minutes  SIGNED:   Jonnie Finner, DO  [facility] 03/16/2020, 11:24 AM   If 7PM-7AM, please contact night-coverage [email or web address]

## 2020-03-16 NOTE — TOC Progression Note (Signed)
Transition of Care [facility]) - Progression Note    Patient Details  Name: Briana Barr MRN: [id number] Date of Birth: 10/12/1940  [facility] CM/SW Contact  Joaquin Courts, RN Phone Number: 03/16/2020, 2:47 PM  Clinical Narrative:    CM spoke with patient who reports she has previously received services from [facility] and would like to use agency again.  Referral faxed to [facility] intake department.  Expected Discharge Plan: [place] Barriers to Discharge: No Barriers Identified  Expected Discharge Plan and Services Expected Discharge Plan: [place]   Discharge Planning Services: CM Consult Post Acute Care Choice: [place] arrangements for the past 2 months: Single Family Home Expected Discharge Date: 03/16/20                         HH Arranged: PT [place]: [facility] [facility] [employer] Date [place]: 03/16/20 Time [place]: 1446 Representative spoke with at [place]: [place] ([place]) Interventions    Readmission Risk Interventions No flowsheet data found.

## 2020-03-16 NOTE — Plan of Care (Signed)
  Problem: Education: Goal: Knowledge of General Education information will improve Description: Including pain rating scale, medication(s)/side effects and non-pharmacologic comfort measures Outcome: Adequate for Discharge   Problem: Respiratory: Goal: Ability to maintain adequate ventilation will improve Outcome: Adequate for Discharge

## 2020-03-16 NOTE — Evaluation (Signed)
Physical Therapy Evaluation Patient Details Name: Briana Barr MRN: [id number] DOB: 1940-05-02 Today's Date: 03/16/2020   History of Present Illness  Briana Barr is a 79 y.o. female with medical history significant of COPD, GERD, HLD who presented to ER with fevers. Admitted for RLL pneumonia.  Clinical Impression  Pt admitted with above diagnosis. Worked during session on gait with RW for pain management.  Pt will be d/c from hospital today and will sign off.  Pt would benefit from HHPT.    Follow Up Recommendations Home health PT    Equipment Recommendations  None recommended by PT    Recommendations for Other Services       Precautions / Restrictions Precautions Precautions: Fall Precaution Comments: R foot pain from fall  (negative imaging)      Mobility  Bed Mobility Overal bed mobility: Needs Assistance Bed Mobility: Supine to Sit     Supine to sit: Supervision;HOB elevated     General bed mobility comments: HOB was elevated. S only    Transfers Overall transfer level: Needs assistance Equipment used: Rolling walker (2 wheeled) Transfers: Sit to/from Stand Sit to Stand: Min guard         General transfer comment: min/guard with cues for hand placement  Ambulation/Gait Ambulation/Gait assistance: Min assist Gait Distance (Feet): 45 Feet Assistive device: Rolling walker (2 wheeled) Gait Pattern/deviations: Decreased stance time - right;Antalgic Gait velocity: decreased   General Gait Details: cues for sequencing and how to use UE to A with decreasing WB for pain management  Stairs            Wheelchair Mobility    Modified Rankin (Stroke Patients Only)       Balance Overall balance assessment: Mild deficits observed, not formally tested                                           Pertinent Vitals/Pain Faces Pain Scale: Hurts even more Pain Location: R foot during gait Pain Descriptors / Indicators:  Grimacing;Sore Pain Intervention(s): Monitored during session;Limited activity within patient's tolerance;Repositioned    Home Living Family/patient expects to be discharged to:: Private residence Living Arrangements: Children Available Help at Discharge: Family;Available 24 hours/day Type of Home: Mobile home Home Access: Ramped entrance     Home Layout: One level Home Equipment: Menan - 2 wheels;Walker - 4 wheels;Cane - single point;Wheelchair - manual;Hospital bed;Other (comment);Tub bench;Bedside commode      Prior Function Level of Independence: Independent with assistive device(s)         Comments: Typically uses cane in home but has been using RW due to foot pain.     Hand Dominance   Dominant Hand: Right    Extremity/Trunk Assessment   Upper Extremity Assessment Upper Extremity Assessment: Overall WFL for tasks assessed    Lower Extremity Assessment Lower Extremity Assessment: Overall WFL for tasks assessed    Cervical / Trunk Assessment Cervical / Trunk Assessment: Kyphotic  Communication   Communication: HOH  Cognition Arousal/Alertness: Awake/alert Behavior During Therapy: WFL for tasks assessed/performed Overall Cognitive Status: History of cognitive impairments - at baseline                                        General Comments      Exercises  Assessment/Plan    PT Assessment All further PT needs can be met in the next venue of care  PT Problem List Decreased strength;Decreased mobility;Decreased activity tolerance;Decreased knowledge of use of DME       PT Treatment Interventions      PT Goals (Current goals can be found in the Care Plan section)  Acute Rehab PT Goals Patient Stated Goal: For foot to get better PT Goal Formulation: All assessment and education complete, DC therapy    Frequency     Barriers to discharge        Co-evaluation               AM-PAC PT "6 Clicks" Mobility  Outcome  Measure Help needed turning from your back to your side while in a flat bed without using bedrails?: A Little Help needed moving from lying on your back to sitting on the side of a flat bed without using bedrails?: A Little Help needed moving to and from a bed to a chair (including a wheelchair)?: A Little Help needed standing up from a chair using your arms (e.g., wheelchair or bedside chair)?: A Little Help needed to walk in hospital room?: A Little Help needed climbing 3-5 steps with a railing? : A Lot 6 Click Score: 17    End of Session Equipment Utilized During Treatment: Oxygen Activity Tolerance: Patient limited by pain Patient left: in chair;with call bell/phone within reach;with family/visitor present Nurse Communication: Mobility status PT Visit Diagnosis: Unsteadiness on feet (R26.81);History of falling (Z91.81);Pain Pain - Right/Left: Right Pain - part of body: Ankle and joints of foot    Time: 1127-1150 PT Time Calculation (min) (ACUTE ONLY): 23 min   Charges:   PT Evaluation $PT Eval Low Complexity: 1 Low PT Treatments $Gait Training: 8-22 mins        [name], [place] Pager [phone] 03/16/2020   [name] 03/16/2020, 12:38 PM

## 2020-03-19 ENCOUNTER — Telehealth: Payer: Self-pay

## 2020-03-19 LAB — CULTURE, BLOOD (ROUTINE X 2)
Culture: NO GROWTH
Culture: NO GROWTH
Special Requests: ADEQUATE

## 2020-03-19 NOTE — Telephone Encounter (Signed)
[facility] called, Mrs. Swoveland was referred by hospital. Estill Bamberg was calling to verify that Provider will sign orders for PT. Verbal orders were given.

## 2020-03-22 ENCOUNTER — Other Ambulatory Visit: Payer: Self-pay

## 2020-03-22 DIAGNOSIS — Z7984 Long term (current) use of oral hypoglycemic drugs: Secondary | ICD-10-CM | POA: Diagnosis not present

## 2020-03-22 DIAGNOSIS — Z7982 Long term (current) use of aspirin: Secondary | ICD-10-CM | POA: Diagnosis not present

## 2020-03-22 DIAGNOSIS — H409 Unspecified glaucoma: Secondary | ICD-10-CM | POA: Diagnosis not present

## 2020-03-22 DIAGNOSIS — I10 Essential (primary) hypertension: Secondary | ICD-10-CM | POA: Diagnosis not present

## 2020-03-22 DIAGNOSIS — K269 Duodenal ulcer, unspecified as acute or chronic, without hemorrhage or perforation: Secondary | ICD-10-CM | POA: Diagnosis not present

## 2020-03-22 DIAGNOSIS — K219 Gastro-esophageal reflux disease without esophagitis: Secondary | ICD-10-CM | POA: Diagnosis not present

## 2020-03-22 DIAGNOSIS — I700 Atherosclerosis of aorta: Secondary | ICD-10-CM | POA: Diagnosis not present

## 2020-03-22 DIAGNOSIS — I82402 Acute embolism and thrombosis of unspecified deep veins of left lower extremity: Secondary | ICD-10-CM | POA: Diagnosis not present

## 2020-03-22 DIAGNOSIS — J9621 Acute and chronic respiratory failure with hypoxia: Secondary | ICD-10-CM | POA: Diagnosis not present

## 2020-03-22 DIAGNOSIS — R630 Anorexia: Secondary | ICD-10-CM | POA: Diagnosis not present

## 2020-03-22 DIAGNOSIS — F17210 Nicotine dependence, cigarettes, uncomplicated: Secondary | ICD-10-CM | POA: Diagnosis not present

## 2020-03-22 DIAGNOSIS — E113599 Type 2 diabetes mellitus with proliferative diabetic retinopathy without macular edema, unspecified eye: Secondary | ICD-10-CM | POA: Diagnosis not present

## 2020-03-22 DIAGNOSIS — E785 Hyperlipidemia, unspecified: Secondary | ICD-10-CM | POA: Diagnosis not present

## 2020-03-22 DIAGNOSIS — Z981 Arthrodesis status: Secondary | ICD-10-CM | POA: Diagnosis not present

## 2020-03-22 DIAGNOSIS — F0150 Vascular dementia without behavioral disturbance: Secondary | ICD-10-CM | POA: Diagnosis not present

## 2020-03-22 DIAGNOSIS — Z9981 Dependence on supplemental oxygen: Secondary | ICD-10-CM | POA: Diagnosis not present

## 2020-03-22 DIAGNOSIS — Z86010 Personal history of colonic polyps: Secondary | ICD-10-CM | POA: Diagnosis not present

## 2020-03-22 DIAGNOSIS — R634 Abnormal weight loss: Secondary | ICD-10-CM | POA: Diagnosis not present

## 2020-03-22 DIAGNOSIS — M810 Age-related osteoporosis without current pathological fracture: Secondary | ICD-10-CM | POA: Diagnosis not present

## 2020-03-22 DIAGNOSIS — J440 Chronic obstructive pulmonary disease with acute lower respiratory infection: Secondary | ICD-10-CM | POA: Diagnosis not present

## 2020-03-22 DIAGNOSIS — K2270 Barrett's esophagus without dysplasia: Secondary | ICD-10-CM | POA: Diagnosis not present

## 2020-03-22 DIAGNOSIS — N281 Cyst of kidney, acquired: Secondary | ICD-10-CM | POA: Diagnosis not present

## 2020-03-22 DIAGNOSIS — J188 Other pneumonia, unspecified organism: Secondary | ICD-10-CM | POA: Diagnosis not present

## 2020-03-22 DIAGNOSIS — D125 Benign neoplasm of sigmoid colon: Secondary | ICD-10-CM | POA: Diagnosis not present

## 2020-03-22 DIAGNOSIS — D128 Benign neoplasm of rectum: Secondary | ICD-10-CM | POA: Diagnosis not present

## 2020-03-22 MED ORDER — HYDROCODONE-ACETAMINOPHEN 5-325 MG PO TABS: 1.0000 | ORAL_TABLET | Freq: Four times a day (QID) | ORAL | 0 refills | Status: DC

## 2020-03-25 ENCOUNTER — Other Ambulatory Visit: Payer: Self-pay

## 2020-03-25 ENCOUNTER — Telehealth: Payer: Self-pay

## 2020-03-25 ENCOUNTER — Ambulatory Visit (INDEPENDENT_AMBULATORY_CARE_PROVIDER_SITE_OTHER): Payer: Medicare Other | Admitting: Family Medicine

## 2020-03-25 VITALS — BP 130/70 | HR 94 | Temp 98.7°F | Ht 64.0 in | Wt 142.0 lb

## 2020-03-25 DIAGNOSIS — J189 Pneumonia, unspecified organism: Secondary | ICD-10-CM | POA: Diagnosis not present

## 2020-03-25 DIAGNOSIS — E119 Type 2 diabetes mellitus without complications: Secondary | ICD-10-CM | POA: Diagnosis not present

## 2020-03-25 DIAGNOSIS — J431 Panlobular emphysema: Secondary | ICD-10-CM

## 2020-03-25 MED ORDER — SITAGLIPTIN PHOSPHATE 100 MG PO TABS: 100.0000 mg | ORAL_TABLET | Freq: Every day | ORAL | 3 refills | Status: DC

## 2020-03-25 NOTE — Telephone Encounter (Signed)
Dorrein from [facility] call for verbal orders, called Dorrein back and left a vm with orders and to also call mutual patient Providers office back.

## 2020-03-25 NOTE — Progress Notes (Signed)
Subjective:    Patient ID: Briana Barr, female    DOB: 09/29/1940, 79 y.o.   MRN: [id number]  Was recently admitted to the hospital with pneumonia:  Admit date: 03/13/2020 Discharge date: 03/16/2020  Admitted From: Home Disposition:  Discharged to home  Recommendations for Outpatient Follow-up:  1. Follow up with PCP in 1-2 weeks  Home Health: N/A  Equipment/Devices: N/A   Discharge Condition: Stable  CODE STATUS: DNR   Brief/Interim Summary: Briana Mahoneyis a 79 y.o.femalewith medical history significant ofCOPD, GERD, HLD. Presenting with fevers. Patient is poor historian. History from son. Patient had a fall on Monday. She was seen by her PCP and everything seemed ok, so she went home. She was in her normal state of health until yesterday afternoon. Family noted that she was shivering. Her son took her temp. The first temp was 99.8, but as he continued to check over the course of the evening, it rose to 101.8 and then 104.8. They spoke with her PCP who ordered some abx for her took. When they tried to pick the abx up, the family noticed that he temp was remaining high, she was weak, and she was having nausea and vomiting. They were concerned and had her transported to the ED.   12/4: She was treated to CAP. And is on Day 3 of rocephin and azithromycin. Her white count has improved to 10.7 and she is down to her baseline O2 use. She has been free of fevers and feels much better. She will finish her azithromycin with a 500mg  PO dose prior to discharge. We will transition her to cefedinir 300mg  BID to finish out 3 more days (first dose this morning). She will not take the remainder of her levaquin. She has had some high glucose readings. She has been given sliding scale insulin. She will need to follow up on her DM control with her PCP. She was reviewed by PT for gait disturbance. They recommend HHPT. Order placed.  She is ok for discharge. Discharge plan and instructions explain in  detail to pt and son. They have voiced understanding and agreement.    Discharge Diagnoses: RLL CAP DM2 HTN Hypokalemia Thrombocytosis GERD Hx of Barrett's Esophagus/Duodenal Ulcer Normocytic anemia COPD w/ chronic respiratory failure w/ hypoxia/Chronic 6L O2 use HLD Gait disturbance  Discharge Instructions  Follow up with PCP in 1 week.    03/25/20 Patient is here today for follow-up.  She completed Rocephin and a Z-Pak while in the hospital and then was transition to 3 days of outpatient Omnicef.  The right upper quadrant pain has resolved ever since the pneumonia was treated.  She denies any worsening shortness of breath beyond her baseline.  She denies any fever or chills.  Therefore, objectively, her pneumonia appears to be resolved.  She is still coughing.  The cough is productive of clear mucus.  She is using Mucinex for this.  Otherwise she is doing well.  Her blood sugars have been between 202 140.  She is only on metformin.  We stopped glimepiride earlier this year due to failure to thrive and focus on quality of life/end-of-life care.  However patient is doing much better under the care of her family and blood sugar is higher than we would like.  Ideally I would like to keep her blood sugar under 200.  While not trying to be extremely precise with her blood sugar I do want to see it lower.  We spent a great deal of time today  discussing options including Jardiance, Trulicity, Januvia, or resuming glimepiride. Past Medical History:  Diagnosis Date  . Barrett esophagus   . Colon polyps    benign  . COPD (chronic obstructive pulmonary disease) ([place])   . Duodenal ulcer   . DVT (deep venous thrombosis) ([facility])    left leg x 2  . Gallstones   . GERD (gastroesophageal reflux disease)   . Glaucoma   . Hyperlipidemia   . Ischemic colitis ([place])   . Osteoporosis   . Pneumonia    hx of x 2 as a child   . Renal cyst, right   . Retinopathy, background, proliferative    Past  Surgical History:  Procedure Laterality Date  . cervical vertebral fusion     c5-6, 6-7  . CHOLECYSTECTOMY    . COLONOSCOPY WITH PROPOFOL N/A 11/23/2016   Procedure: COLONOSCOPY WITH PROPOFOL;  Surgeon: Ladene Artist, MD;  Location: [facility];  Service: Endoscopy;  Laterality: N/A;  . ESOPHAGOGASTRODUODENOSCOPY (EGD) WITH PROPOFOL N/A 11/23/2016   Procedure: ESOPHAGOGASTRODUODENOSCOPY (EGD) WITH PROPOFOL;  Surgeon: Ladene Artist, MD;  Location: [facility];  Service: Endoscopy;  Laterality: N/A;  . FEMORAL-POPLITEAL BYPASS GRAFT    . IR RADIOLOGIST EVAL & MGMT  06/06/2019    Current Outpatient Medications on File Prior to Visit  Medication Sig Dispense Refill  . acetaZOLAMIDE (DIAMOX) 250 MG tablet TAKE 1 TABLET BY MOUTH EVERY DAY (Patient taking differently: Take 250 mg by mouth daily. ) 90 tablet 1  . albuterol (PROAIR HFA) 108 (90 Base) MCG/ACT inhaler Inhale 2 puffs into the lungs every 6 (six) hours as needed for wheezing or shortness of breath. 18 g 1  . aspirin 81 MG tablet Take 81 mg by mouth daily.    . Cholecalciferol (VITAMIN D3) 250 MCG (10000 UT) capsule Take 1 capsule (10,000 Units total) by mouth daily. 30 capsule   . ELDERBERRY PO Take 1 each by mouth daily.    Marland Kitchen escitalopram (LEXAPRO) 10 MG tablet TAKE 1 TABLET BY MOUTH EVERY DAY (Patient taking differently: Take 10 mg by mouth daily. ) 90 tablet 3  . Fluticasone-Umeclidin-Vilant (TRELEGY ELLIPTA) 100-62.5-25 MCG/INH AEPB Inhale 1 Inhaler into the lungs daily. 1 each 11  . glimepiride (AMARYL) 1 MG tablet TAKE 1 TABLET BY MOUTH EVERY DAY IN THE MORNING (Patient not taking: Reported on 02/02/2020) 90 tablet 1  . HYDROcodone-acetaminophen (NORCO) 5-325 MG tablet Take 1 tablet by mouth 4 (four) times daily. 90 tablet 0  . ipratropium-albuterol (DUONEB) 0.5-2.5 (3) MG/3ML SOLN Take 3 mLs by nebulization every 6 (six) hours as needed. (Patient taking differently: Take 3 mLs by nebulization every 6 (six) hours as needed  (SOB, wheezing). ) 360 mL 1  . losartan (COZAAR) 50 MG tablet Take 1 tablet (50 mg total) by mouth daily. 90 tablet 3  . metFORMIN (GLUCOPHAGE) 500 MG tablet Take 1 tablet (500 mg total) by mouth 2 (two) times daily with a meal. 180 tablet 1  . Multiple Vitamins-Minerals (CENTRUM SILVER 50+WOMEN) TABS Take 1 tablet by mouth daily. 90 tablet   . omeprazole (PRILOSEC) 20 MG capsule TAKE 1 CAPSULE BY MOUTH TWICE A DAY BEFORE A MEAL (Patient taking differently: Take 20 mg by mouth 2 (two) times daily before a meal. ) 180 capsule 3  . rosuvastatin (CRESTOR) 20 MG tablet TAKE 1 TABLET BY MOUTH EVERYDAY AT BEDTIME (Patient taking differently: Take 20 mg by mouth at bedtime. ) 90 tablet 1   No current facility-administered medications on file  prior to visit.   Allergies  Allergen Reactions  . Vioxx [Rofecoxib] Swelling   Social History   Socioeconomic History  . Marital status: Widowed    Spouse name: Not on file  . Number of children: 3  . Years of education: Not on file  . Highest education level: Not on file  Occupational History  . Occupation: retired  Tobacco Use  . Smoking status: Current Every Day Smoker    Packs/day: 1.00    Types: Cigarettes  . Smokeless tobacco: Never Used  . Tobacco comment: started age 79  Vaping Use  . Vaping Use: Never used  Substance and Sexual Activity  . Alcohol use: No  . Drug use: No  . Sexual activity: Not on file  Other Topics Concern  . Not on file  Social History Narrative  . Not on file   Social Determinants of Health   Financial Resource Strain: Low Risk   . Difficulty of Paying Living Expenses: Not very hard  Food Insecurity: Not on file  Transportation Needs: Not on file  Physical Activity: Not on file  Stress: Not on file  Social Connections: Not on file  Intimate Partner Violence: Not on file   Family History  Problem Relation Age of Onset  . Other Father        some sort of blood cancer-had to get transfusions  . Stomach  cancer Maternal Grandmother   . Stomach cancer Maternal Grandfather       Review of Systems  All other systems reviewed and are negative.      Objective:   Physical Exam Vitals reviewed.  Constitutional:      General: She is not in acute distress.    Appearance: She is well-developed. She is not diaphoretic.  HENT:     Head: Normocephalic and atraumatic.     Right Ear: External ear normal.     Left Ear: External ear normal.     Nose: Nose normal.     Mouth/Throat:     Pharynx: No oropharyngeal exudate.  Eyes:     General: No scleral icterus.       Right eye: No discharge.        Left eye: No discharge.     Conjunctiva/sclera: Conjunctivae normal.  Neck:     Thyroid: No thyromegaly.     Vascular: No JVD.  Cardiovascular:     Rate and Rhythm: Normal rate and regular rhythm.     Heart sounds: Normal heart sounds. No murmur heard. No friction rub. No gallop.   Pulmonary:     Effort: Pulmonary effort is normal.     Breath sounds: Wheezing present. No rhonchi.  Abdominal:     General: Bowel sounds are normal. There is no distension.     Palpations: Abdomen is soft. There is no mass.     Tenderness: There is no abdominal tenderness. There is no guarding or rebound.  Musculoskeletal:     Cervical back: Neck supple.  Lymphadenopathy:     Cervical: No cervical adenopathy.  Skin:    General: Skin is warm.     Coloration: Skin is not pale.     Findings: No erythema or rash.  Neurological:     Mental Status: She is alert and oriented to person, place, and time.     Cranial Nerves: No cranial nerve deficit.     Motor: No abnormal muscle tone.     Coordination: Coordination normal.     Deep Tendon Reflexes:  Reflexes are normal and symmetric.  Psychiatric:        Behavior: Behavior normal.        Thought Content: Thought content normal.        Judgment: Judgment normal.           Assessment & Plan:  Panlobular emphysema (HCC)  Pneumonia of right lower lobe due  to infectious organism  Diabetes mellitus type 2 in nonobese (H[facility])  Clinically, her pneumonia has resolved.  She does still have a cough but I believe this is most likely due to her severe underlying COPD rather than persistent pneumonia given the fact her fever has gone away and the pain has resolved.  Continue to use Trelegy daily and use albuterol 2 puffs every 6 hours as needed for wheezing and shortness of breath.  Continue to use oxygen 6 L via nasal cannula.  Continue to focus on quality of life and comfort care.  I do not want to be extremely precise with her blood sugar control due to the risk of hypoglycemia.  Therefore rather than resume glimepiride, together, the patient and I have decided to start Januvia 100 mg a day.  I would be completely happy if her blood sugars in 1 month are under 200.  The family will call me back in 1 month and give me an update.

## 2020-03-27 ENCOUNTER — Telehealth: Payer: Self-pay

## 2020-03-27 NOTE — Telephone Encounter (Signed)
(  4:21p) SW left a message with PCG-Jesi requesting a call back to check patient status and schedule a visit.

## 2020-03-28 DIAGNOSIS — M810 Age-related osteoporosis without current pathological fracture: Secondary | ICD-10-CM | POA: Diagnosis not present

## 2020-03-28 DIAGNOSIS — J188 Other pneumonia, unspecified organism: Secondary | ICD-10-CM | POA: Diagnosis not present

## 2020-03-28 DIAGNOSIS — F0150 Vascular dementia without behavioral disturbance: Secondary | ICD-10-CM | POA: Diagnosis not present

## 2020-03-28 DIAGNOSIS — Z23 Encounter for immunization: Secondary | ICD-10-CM | POA: Diagnosis not present

## 2020-03-28 DIAGNOSIS — I82402 Acute embolism and thrombosis of unspecified deep veins of left lower extremity: Secondary | ICD-10-CM | POA: Diagnosis not present

## 2020-03-28 DIAGNOSIS — J440 Chronic obstructive pulmonary disease with acute lower respiratory infection: Secondary | ICD-10-CM | POA: Diagnosis not present

## 2020-03-28 DIAGNOSIS — I10 Essential (primary) hypertension: Secondary | ICD-10-CM | POA: Diagnosis not present

## 2020-03-29 DIAGNOSIS — M810 Age-related osteoporosis without current pathological fracture: Secondary | ICD-10-CM | POA: Diagnosis not present

## 2020-03-29 DIAGNOSIS — J440 Chronic obstructive pulmonary disease with acute lower respiratory infection: Secondary | ICD-10-CM | POA: Diagnosis not present

## 2020-03-29 DIAGNOSIS — J188 Other pneumonia, unspecified organism: Secondary | ICD-10-CM | POA: Diagnosis not present

## 2020-03-29 DIAGNOSIS — I10 Essential (primary) hypertension: Secondary | ICD-10-CM | POA: Diagnosis not present

## 2020-03-29 DIAGNOSIS — I82402 Acute embolism and thrombosis of unspecified deep veins of left lower extremity: Secondary | ICD-10-CM | POA: Diagnosis not present

## 2020-03-29 DIAGNOSIS — F0150 Vascular dementia without behavioral disturbance: Secondary | ICD-10-CM | POA: Diagnosis not present

## 2020-04-01 DIAGNOSIS — M810 Age-related osteoporosis without current pathological fracture: Secondary | ICD-10-CM | POA: Diagnosis not present

## 2020-04-01 DIAGNOSIS — F0150 Vascular dementia without behavioral disturbance: Secondary | ICD-10-CM | POA: Diagnosis not present

## 2020-04-01 DIAGNOSIS — J440 Chronic obstructive pulmonary disease with acute lower respiratory infection: Secondary | ICD-10-CM | POA: Diagnosis not present

## 2020-04-01 DIAGNOSIS — I82402 Acute embolism and thrombosis of unspecified deep veins of left lower extremity: Secondary | ICD-10-CM | POA: Diagnosis not present

## 2020-04-01 DIAGNOSIS — I10 Essential (primary) hypertension: Secondary | ICD-10-CM | POA: Diagnosis not present

## 2020-04-01 DIAGNOSIS — J188 Other pneumonia, unspecified organism: Secondary | ICD-10-CM | POA: Diagnosis not present

## 2020-04-03 ENCOUNTER — Telehealth: Payer: Self-pay

## 2020-04-03 DIAGNOSIS — I82402 Acute embolism and thrombosis of unspecified deep veins of left lower extremity: Secondary | ICD-10-CM | POA: Diagnosis not present

## 2020-04-03 DIAGNOSIS — F0150 Vascular dementia without behavioral disturbance: Secondary | ICD-10-CM | POA: Diagnosis not present

## 2020-04-03 DIAGNOSIS — M810 Age-related osteoporosis without current pathological fracture: Secondary | ICD-10-CM | POA: Diagnosis not present

## 2020-04-03 DIAGNOSIS — J188 Other pneumonia, unspecified organism: Secondary | ICD-10-CM | POA: Diagnosis not present

## 2020-04-03 DIAGNOSIS — I10 Essential (primary) hypertension: Secondary | ICD-10-CM | POA: Diagnosis not present

## 2020-04-03 DIAGNOSIS — J440 Chronic obstructive pulmonary disease with acute lower respiratory infection: Secondary | ICD-10-CM | POA: Diagnosis not present

## 2020-04-03 NOTE — Telephone Encounter (Signed)
Telephone call to patient to schedule palliative care visit with patient. Patient/family-Briana Barr in agreement with home visit on 12-23@ 11 am.

## 2020-04-04 ENCOUNTER — Other Ambulatory Visit: Payer: Medicare Other

## 2020-04-04 ENCOUNTER — Other Ambulatory Visit: Payer: Self-pay

## 2020-04-04 DIAGNOSIS — Z515 Encounter for palliative care: Secondary | ICD-10-CM

## 2020-04-05 NOTE — Progress Notes (Signed)
COMMUNITY PALLIATIVE CARE SW NOTE  PATIENT NAME: Briana Barr DOB: November 26, 1940 MRN: [id number]  PRIMARY CARE PROVIDER: [name], MD  RESPONSIBLE PARTY:  Acct ID - Guarantor Home Phone Work Phone Relationship Acct Type  [id number] AZYAH, FLETT [phone]  Self P/F     [place], [place], [place] [address]     PLAN OF CARE and INTERVENTIONS:             1. GOALS OF CARE/ ADVANCE CARE PLANNING:  The goal is for patient to remain independently I her home. Patient is a DNR, form is in the home.  1.  2. SOCIAL/EMOTIONAL/SPIRITUAL ASSESSMENT/ INTERVENTIONS:  SW completed a visit with patient at home. Patient was finishing up her cigarette when SW arrived. Patient was sitting up in her chair, her son assisted her with putting her 02 back on. Patient was alert and oriented to self. She denied pain and stated "I'm just getting along". Patient's son-David was present with her and he provided an update on patient status. Patient has a hospitalization about two weeks ago for pneumonia. Prior to the hospitalization, she had a fall. . Patient's intake remains fair but has declined to where she is now eating two small meals. Her son is managing her diabetes through portion control and cutting her carbohydrates due to recent high blood sugars. Patient is not sleeping as much as she was. Patient is using her walker. She requires assistance with personal care. Patient's weight is stable. She takes Ensure with her medications. Shanon Brow notes that patient's memory is declining as she often does not remember eating or taking meds. Patient is on 6 L of o2 continuous. Patient is receiving physical therapy 2x's week for 4 weeks. Patient's daughter-in-law- Marlana Salvage arrived during this visit. She had no questions or concerns as she felt patient was stable. SW assessed patient's needs and comfort, provided supportive presence, active listening, reinforced palliative acces and support. SW will continue to provide  psychosocial assessment and support through regular contact with patient and family.  2. PATIENT/CAREGIVER EDUCATION/ COPING:  Patient was awake and alert during this visit. Patient's son is her PCG. Patient and son are coping well.  3.  4. PERSONAL EMERGENCY PLAN: 911 can be activated for emergencies. 5. COMMUNITY RESOURCES COORDINATION/ HEALTH CARE NAVIGATION:  Patient is receiving physical therapy 2x week for next four weeks.  6. FINANCIAL/LEGAL CONCERNS/INTERVENTIONS:  None.     SOCIAL HX:  Social History   Tobacco Use  . Smoking status: Current Every Day Smoker    Packs/day: 1.00    Types: Cigarettes  . Smokeless tobacco: Never Used  . Tobacco comment: started age 37  Substance Use Topics  . Alcohol use: No    CODE STATUS: DNR ADVANCED DIRECTIVES: No MOST FORM COMPLETE:  No HOSPICE EDUCATION PROVIDED: No  PPS: Patient is alert and oriented x3, but is increasingly intermittently confused and her short term memory deficits are increasing; she is hard of hearing. Patient is independent for ADL's but is requiring more assistance.  Duration of visit and documentation: 60 minutes      Katheren Puller, LCSW

## 2020-04-08 DIAGNOSIS — I10 Essential (primary) hypertension: Secondary | ICD-10-CM | POA: Diagnosis not present

## 2020-04-08 DIAGNOSIS — J188 Other pneumonia, unspecified organism: Secondary | ICD-10-CM | POA: Diagnosis not present

## 2020-04-08 DIAGNOSIS — F0150 Vascular dementia without behavioral disturbance: Secondary | ICD-10-CM | POA: Diagnosis not present

## 2020-04-08 DIAGNOSIS — I82402 Acute embolism and thrombosis of unspecified deep veins of left lower extremity: Secondary | ICD-10-CM | POA: Diagnosis not present

## 2020-04-08 DIAGNOSIS — M810 Age-related osteoporosis without current pathological fracture: Secondary | ICD-10-CM | POA: Diagnosis not present

## 2020-04-08 DIAGNOSIS — J440 Chronic obstructive pulmonary disease with acute lower respiratory infection: Secondary | ICD-10-CM | POA: Diagnosis not present

## 2020-04-09 ENCOUNTER — Other Ambulatory Visit: Payer: Self-pay | Admitting: Family Medicine

## 2020-04-09 NOTE — Telephone Encounter (Signed)
Please D/C as refill requested too soon. Last refill 03/22/2020.

## 2020-04-10 DIAGNOSIS — I10 Essential (primary) hypertension: Secondary | ICD-10-CM | POA: Diagnosis not present

## 2020-04-10 DIAGNOSIS — M810 Age-related osteoporosis without current pathological fracture: Secondary | ICD-10-CM | POA: Diagnosis not present

## 2020-04-10 DIAGNOSIS — I82402 Acute embolism and thrombosis of unspecified deep veins of left lower extremity: Secondary | ICD-10-CM | POA: Diagnosis not present

## 2020-04-10 DIAGNOSIS — J440 Chronic obstructive pulmonary disease with acute lower respiratory infection: Secondary | ICD-10-CM | POA: Diagnosis not present

## 2020-04-10 DIAGNOSIS — F0150 Vascular dementia without behavioral disturbance: Secondary | ICD-10-CM | POA: Diagnosis not present

## 2020-04-10 DIAGNOSIS — J188 Other pneumonia, unspecified organism: Secondary | ICD-10-CM | POA: Diagnosis not present

## 2020-04-17 DIAGNOSIS — M810 Age-related osteoporosis without current pathological fracture: Secondary | ICD-10-CM | POA: Diagnosis not present

## 2020-04-17 DIAGNOSIS — F0150 Vascular dementia without behavioral disturbance: Secondary | ICD-10-CM | POA: Diagnosis not present

## 2020-04-17 DIAGNOSIS — I82402 Acute embolism and thrombosis of unspecified deep veins of left lower extremity: Secondary | ICD-10-CM | POA: Diagnosis not present

## 2020-04-17 DIAGNOSIS — I10 Essential (primary) hypertension: Secondary | ICD-10-CM | POA: Diagnosis not present

## 2020-04-17 DIAGNOSIS — J188 Other pneumonia, unspecified organism: Secondary | ICD-10-CM | POA: Diagnosis not present

## 2020-04-17 DIAGNOSIS — J440 Chronic obstructive pulmonary disease with acute lower respiratory infection: Secondary | ICD-10-CM | POA: Diagnosis not present

## 2020-04-21 DIAGNOSIS — Z86010 Personal history of colonic polyps: Secondary | ICD-10-CM | POA: Diagnosis not present

## 2020-04-21 DIAGNOSIS — J188 Other pneumonia, unspecified organism: Secondary | ICD-10-CM | POA: Diagnosis not present

## 2020-04-21 DIAGNOSIS — F17210 Nicotine dependence, cigarettes, uncomplicated: Secondary | ICD-10-CM | POA: Diagnosis not present

## 2020-04-21 DIAGNOSIS — Z7982 Long term (current) use of aspirin: Secondary | ICD-10-CM | POA: Diagnosis not present

## 2020-04-21 DIAGNOSIS — J9621 Acute and chronic respiratory failure with hypoxia: Secondary | ICD-10-CM | POA: Diagnosis not present

## 2020-04-21 DIAGNOSIS — E113599 Type 2 diabetes mellitus with proliferative diabetic retinopathy without macular edema, unspecified eye: Secondary | ICD-10-CM | POA: Diagnosis not present

## 2020-04-21 DIAGNOSIS — D125 Benign neoplasm of sigmoid colon: Secondary | ICD-10-CM | POA: Diagnosis not present

## 2020-04-21 DIAGNOSIS — K2270 Barrett's esophagus without dysplasia: Secondary | ICD-10-CM | POA: Diagnosis not present

## 2020-04-21 DIAGNOSIS — J440 Chronic obstructive pulmonary disease with acute lower respiratory infection: Secondary | ICD-10-CM | POA: Diagnosis not present

## 2020-04-21 DIAGNOSIS — Z7984 Long term (current) use of oral hypoglycemic drugs: Secondary | ICD-10-CM | POA: Diagnosis not present

## 2020-04-21 DIAGNOSIS — F0150 Vascular dementia without behavioral disturbance: Secondary | ICD-10-CM | POA: Diagnosis not present

## 2020-04-21 DIAGNOSIS — R630 Anorexia: Secondary | ICD-10-CM | POA: Diagnosis not present

## 2020-04-21 DIAGNOSIS — D128 Benign neoplasm of rectum: Secondary | ICD-10-CM | POA: Diagnosis not present

## 2020-04-21 DIAGNOSIS — K269 Duodenal ulcer, unspecified as acute or chronic, without hemorrhage or perforation: Secondary | ICD-10-CM | POA: Diagnosis not present

## 2020-04-21 DIAGNOSIS — I10 Essential (primary) hypertension: Secondary | ICD-10-CM | POA: Diagnosis not present

## 2020-04-21 DIAGNOSIS — I82402 Acute embolism and thrombosis of unspecified deep veins of left lower extremity: Secondary | ICD-10-CM | POA: Diagnosis not present

## 2020-04-21 DIAGNOSIS — K219 Gastro-esophageal reflux disease without esophagitis: Secondary | ICD-10-CM | POA: Diagnosis not present

## 2020-04-21 DIAGNOSIS — N281 Cyst of kidney, acquired: Secondary | ICD-10-CM | POA: Diagnosis not present

## 2020-04-21 DIAGNOSIS — H409 Unspecified glaucoma: Secondary | ICD-10-CM | POA: Diagnosis not present

## 2020-04-21 DIAGNOSIS — M810 Age-related osteoporosis without current pathological fracture: Secondary | ICD-10-CM | POA: Diagnosis not present

## 2020-04-21 DIAGNOSIS — I700 Atherosclerosis of aorta: Secondary | ICD-10-CM | POA: Diagnosis not present

## 2020-04-21 DIAGNOSIS — E785 Hyperlipidemia, unspecified: Secondary | ICD-10-CM | POA: Diagnosis not present

## 2020-04-21 DIAGNOSIS — Z981 Arthrodesis status: Secondary | ICD-10-CM | POA: Diagnosis not present

## 2020-04-21 DIAGNOSIS — Z9981 Dependence on supplemental oxygen: Secondary | ICD-10-CM | POA: Diagnosis not present

## 2020-04-21 DIAGNOSIS — R634 Abnormal weight loss: Secondary | ICD-10-CM | POA: Diagnosis not present

## 2020-04-23 ENCOUNTER — Telehealth: Payer: Self-pay

## 2020-04-23 DIAGNOSIS — M810 Age-related osteoporosis without current pathological fracture: Secondary | ICD-10-CM | POA: Diagnosis not present

## 2020-04-23 DIAGNOSIS — J188 Other pneumonia, unspecified organism: Secondary | ICD-10-CM | POA: Diagnosis not present

## 2020-04-23 DIAGNOSIS — I10 Essential (primary) hypertension: Secondary | ICD-10-CM | POA: Diagnosis not present

## 2020-04-23 DIAGNOSIS — I82402 Acute embolism and thrombosis of unspecified deep veins of left lower extremity: Secondary | ICD-10-CM | POA: Diagnosis not present

## 2020-04-23 DIAGNOSIS — F0150 Vascular dementia without behavioral disturbance: Secondary | ICD-10-CM | POA: Diagnosis not present

## 2020-04-23 DIAGNOSIS — J440 Chronic obstructive pulmonary disease with acute lower respiratory infection: Secondary | ICD-10-CM | POA: Diagnosis not present

## 2020-04-23 NOTE — Telephone Encounter (Signed)
sure

## 2020-04-23 NOTE — Telephone Encounter (Signed)
Order being placed for Home Health and OPT

## 2020-04-23 NOTE — Telephone Encounter (Signed)
Ok to put in order for [facility] order for duration of [facility] coming out to home?

## 2020-04-23 NOTE — Chronic Care Management (AMB) (Signed)
Chronic Care Management Pharmacy  Name: Briana Barr  MRN: [id number] DOB: [birth year]  Chief Complaint/ HPI  Briana Barr,  80 y.o. , female presents for their Follow-Up CCM visit with the clinical pharmacist via telephone.  PCP : [name], MD  Their chronic conditions include: HTN, COPD, Type II DM.  Office Visits: 03/25/20 ([facility]) - Pneumonia has resolved, started on Januvia 100mg  daily instead of restarting glimepiride.  12/01/2019 ([facility]) - Patient has severe end stage oxygen dependent COPD.  She was given a life expectancy of 6 months or less.  Consult Visit: 12/13/2019 (Palliative Care) - her goal is to remain independent and in her home, she reports a good appetite and her son prepares her meals.  Family checks blood pressure and sugars daily.  Medications: Outpatient Encounter Medications as of 05/07/2020  Medication Sig  . acetaZOLAMIDE (DIAMOX) 250 MG tablet TAKE 1 TABLET BY MOUTH EVERY DAY (Patient taking differently: Take 250 mg by mouth daily.)  . albuterol (PROAIR HFA) 108 (90 Base) MCG/ACT inhaler Inhale 2 puffs into the lungs every 6 (six) hours as needed for wheezing or shortness of breath.  Marland Kitchen aspirin 81 MG tablet Take 81 mg by mouth daily.  . Cholecalciferol (VITAMIN D3) 250 MCG (10000 UT) capsule Take 1 capsule (10,000 Units total) by mouth daily.  Marland Kitchen ELDERBERRY PO Take 1 each by mouth daily.  Marland Kitchen escitalopram (LEXAPRO) 10 MG tablet TAKE 1 TABLET BY MOUTH EVERY DAY (Patient taking differently: Take 10 mg by mouth daily.)  . Fluticasone-Umeclidin-Vilant (TRELEGY ELLIPTA) 100-62.5-25 MCG/INH AEPB Inhale 1 Inhaler into the lungs daily.  Marland Kitchen HYDROcodone-acetaminophen (NORCO/VICODIN) 5-325 MG tablet Take 1 tablet by mouth 4 (four) times daily.  Marland Kitchen ipratropium-albuterol (DUONEB) 0.5-2.5 (3) MG/3ML SOLN Take 3 mLs by nebulization every 6 (six) hours as needed. (Patient taking differently: Take 3 mLs by nebulization every 6 (six) hours as needed (SOB, wheezing).)   . losartan (COZAAR) 50 MG tablet Take 1 tablet (50 mg total) by mouth daily.  . metFORMIN (GLUCOPHAGE) 500 MG tablet Take 1 tablet (500 mg total) by mouth 2 (two) times daily with a meal.  . Multiple Vitamins-Minerals (CENTRUM SILVER 50+WOMEN) TABS Take 1 tablet by mouth daily.  Marland Kitchen omeprazole (PRILOSEC) 20 MG capsule TAKE 1 CAPSULE BY MOUTH TWICE A DAY BEFORE A MEAL (Patient taking differently: Take 20 mg by mouth 2 (two) times daily before a meal.)  . rosuvastatin (CRESTOR) 20 MG tablet TAKE 1 TABLET BY MOUTH EVERYDAY AT BEDTIME (Patient taking differently: Take 20 mg by mouth at bedtime.)  . sitaGLIPtin (JANUVIA) 100 MG tablet Take 1 tablet (100 mg total) by mouth daily.  . [DISCONTINUED] glimepiride (AMARYL) 1 MG tablet TAKE 1 TABLET BY MOUTH EVERY DAY IN THE MORNING (Patient not taking: No sig reported)   No facility-administered encounter medications on file as of 05/07/2020.     Current Diagnosis/Assessment:  Goals Addressed            This Visit's Progress   . Pharmacy Care Plan:       CARE PLAN ENTRY (see longitudinal plan of care for additional care plan information)  Current Barriers:  . Chronic Disease Management support, education, and care coordination needs related to Hypertension, Diabetes, and COPD   Hypertension BP Readings from Last 3 Encounters:  03/25/20 130/70  03/16/20 (!) 145/93  03/11/20 120/70   . Pharmacist Clinical Goal(s): o Over the next 120 days, patient will work with PharmD and providers to maintain BP goal <140/90 . Current  regimen:  o Losartan 50mg  . Interventions: o Reviewed home blood pressure monitoring o Discussed medication adherence . Patient self care activities - Over the next 90 days, patient will: o Check BP daily, document, and provide at future appointments o Ensure daily salt intake < 2300 mg/day  Diabetes Lab Results  Component Value Date/Time   HGBA1C 8.7 (H) 03/14/2020 10:56 AM   HGBA1C 7.0 (H) 12/01/2019 12:41 PM    . Pharmacist Clinical Goal(s): o Over the next 120 days, patient will work with PharmD and providers to maintain A1c goal <8% . Current regimen:  o Metformin 500mg  bid o Januvia 100mg  daily . Interventions: o Discussed risk of hypoglycemia with advancing age and comorbidities o Reviewed home blood sugar readings o Discussed medication adherence o Updated medication list . Patient self care activities - Over the next 120 days, patient will: o Check blood sugar once daily, document, and provide at future appointments o Contact provider with any episodes of hypoglycemia  COPD . Pharmacist Clinical Goal(s) o Over the next 90 days, patient will work with PharmD and providers to optimize medication and minimize symptoms of COPD . Current regimen:   Trelegy Ellipta 100-62.5-25 mcg daily  Albuterol HFA 90 mcg prn  Duoneb 0.5-2.5 mg/ml prn . Interventions: o Reviewed frequency of inhaler use o Discussed episodes of SOB o Counseled on proper inhaler use . Patient self care activities - Over the next 90 days, patient will: o Continue to take medications as directed o Contact providers with any new or worsening SOB  Medication management . Pharmacist Clinical Goal(s): o Over the next 90 days, patient will work with PharmD and providers to maintain optimal medication adherence . Current [facility]: [facility] . Interventions o Comprehensive medication review performed. o Utilize [facility] for medication synchronization, packaging and delivery . Patient self care activities - Over the next 90 days, patient will: o Focus on medication adherence by switching to pill packs from [facility] o Take medications as prescribed o Report any questions or concerns to PharmD and/or provider(s)  Please see past updates related to this goal by clicking on the "Past Updates" button in the selected goal         Hypertension   BP goal is:  <140/90  Office blood pressures are  BP Readings from  Last 3 Encounters:  03/25/20 130/70  03/16/20 (!) 145/93  03/11/20 120/70   Patient checks BP at home daily Patient home BP readings are ranging: no logs available  Patient has failed these meds in the past: none noted Patient is currently controlled on the following medications:  . Losartan 50mg   We discussed   BP has been controlled at home and with hospice caretakers  Takes medication each morning  Denies headaches and dizziness  Update 05/07/20 Not checking at home regularly, palliative nurses check and it is always normal Denies dizziness She is monitoring weight at home to make sure she is not retaining fluid Adherent with medication  Plan  Continue current medications     Diabetes   A1c goal <8%  Recent Relevant Labs: Lab Results  Component Value Date/Time   HGBA1C 8.7 (H) 03/14/2020 10:56 AM   HGBA1C 7.0 (H) 12/01/2019 12:41 PM   MICROALBUR 1.7 01/12/2019 10:17 AM   MICROALBUR 1.2 12/07/2017 09:39 AM    Last diabetic Eye exam:  Lab Results  Component Value Date/Time   HMDIABEYEEXA No Retinopathy 12/30/2017 12:00 AM    Last diabetic Foot exam: No results found for: HMDIABFOOTEX  Checking BG: Daily  Recent FBG Readings: 180, 151  Patient has failed these meds in past: none noted Patient is currently controlled on the following medications: Marland Kitchen Metformin 500mg  twice daily . Januvia 100mg  daily  We discussed:   Due to her prognosis, glimepiride was d/c to prevent risk of hypoglycemia which I think was a great idea  Now just on metformin and reports no episodes of hypoglycemia  Admits there has been some snacking on Halloween candy lately leading to the readings up around 200.  She is walking as much as she can and keeping a close eye on blood sugar every day.  Update 05/07/20 Fasting sugars have been between 180 and 151 since starting Januvia No episodes of hypoglycemia D/c glimepiride from med list She is now walking with a walker and they  are getting her out of bed for exercise twice daily   Plan  Continue current medications  COPD    Eosinophil count:   Lab Results  Component Value Date/Time   EOSPCT 4 03/16/2020 07:09 AM  %                               Eos (Absolute):  Lab Results  Component Value Date/Time   EOSABS 0.5 03/16/2020 07:09 AM    Tobacco Status:  Social History   Tobacco Use  Smoking Status Current Every Day Smoker  . Packs/day: 1.00  . Types: Cigarettes  Smokeless Tobacco Never Used  Tobacco Comment   started age 32    Patient has failed these meds in past: none noted Patient is currently controlled on the following medications:   Trelegy Ellipta 100-62.5-25 mcg daily  Albuterol HFA 90 mcg prn  Duoneb 0.5-2.5 mg/ml prn Using maintenance inhaler regularly? Yes Frequency of rescue inhaler use:  1-2x per week  We discussed:    Using maintenance inhaler daily and rarely having to use Duonebs or albuterol inhaler  Daughter in law has noticed a great improvement since return home from the hospital and completion of antibiotics  Trelegy is affordable, patient on SSI so may qualify for PAP next year if cost becomes an issue   Update 05/07/20 Continues to use Trelegy daily, reports copay is affordable She continues to smoke and takes off her Oxygen to do so, daughter states that they have tried everything and have come to terms that she is not going to stop, they just have to work around it Only using albuterol nebs or inhaler sparingly as needed Recently recovered from pneumonia but has since been more active walking  Plan  Continue current medications  Vaccines   Reviewed and discussed patient's vaccination history.    Immunization History  Administered Date(s) Administered  . Fluad Quad(high Dose 65+) 01/12/2019  . Influenza,inj,Quad PF,6+ Mos 03/02/2016, 12/07/2017  . Pneumococcal Conjugate-13 04/13/2014  . Pneumococcal Polysaccharide-23 12/07/2017    Plan  Recommended patient receive flu vaccine in office.   Medication Management   . Miscellaneous medications:  o Acetazolamide 250mg  daily o Omeprazole 20mg  bid o Excitalopram 10mg  o Rosuvastatin 20mg  . OTC's:  o MVI o ASA 81mg  o Vitamin D 1000 IU . [facility]  . Patient reports using pill box method sometimes, to organize medications and promote adherence. . Patient denies missed doses of medication.  Verbal consent obtained for [facility] enhanced pharmacy services (medication synchronization, adherence packaging, delivery coordination). A medication sync plan was created to allow patient to  get all medications delivered once every 30 to 90 days per patient preference. Patient understands they have freedom to choose pharmacy and clinical pharmacist will coordinate care between all prescribers and [facility].  [facility] Monthly delivery call completed this morning, patient should be synchronized next month.  Will schedule delivery for requested medications.  Beverly Milch, PharmD Clinical Pharmacist [place] [phone]

## 2020-04-26 ENCOUNTER — Telehealth: Payer: Self-pay | Admitting: *Deleted

## 2020-04-26 NOTE — Telephone Encounter (Signed)
Called and left a message with patient's daughter in law, [name], to schedule a palliative care home visit. Contact information left for return call.

## 2020-05-03 DIAGNOSIS — J188 Other pneumonia, unspecified organism: Secondary | ICD-10-CM | POA: Diagnosis not present

## 2020-05-03 DIAGNOSIS — F0150 Vascular dementia without behavioral disturbance: Secondary | ICD-10-CM | POA: Diagnosis not present

## 2020-05-03 DIAGNOSIS — I10 Essential (primary) hypertension: Secondary | ICD-10-CM | POA: Diagnosis not present

## 2020-05-03 DIAGNOSIS — I82402 Acute embolism and thrombosis of unspecified deep veins of left lower extremity: Secondary | ICD-10-CM | POA: Diagnosis not present

## 2020-05-03 DIAGNOSIS — M810 Age-related osteoporosis without current pathological fracture: Secondary | ICD-10-CM | POA: Diagnosis not present

## 2020-05-03 DIAGNOSIS — J440 Chronic obstructive pulmonary disease with acute lower respiratory infection: Secondary | ICD-10-CM | POA: Diagnosis not present

## 2020-05-06 ENCOUNTER — Other Ambulatory Visit: Payer: Medicare Other | Admitting: *Deleted

## 2020-05-06 ENCOUNTER — Other Ambulatory Visit: Payer: Self-pay

## 2020-05-06 DIAGNOSIS — Z515 Encounter for palliative care: Secondary | ICD-10-CM

## 2020-05-07 ENCOUNTER — Telehealth: Payer: Self-pay | Admitting: Pharmacist

## 2020-05-07 ENCOUNTER — Ambulatory Visit: Payer: Medicare Other | Admitting: Pharmacist

## 2020-05-07 DIAGNOSIS — J431 Panlobular emphysema: Secondary | ICD-10-CM

## 2020-05-07 DIAGNOSIS — E119 Type 2 diabetes mellitus without complications: Secondary | ICD-10-CM

## 2020-05-07 DIAGNOSIS — I10 Essential (primary) hypertension: Secondary | ICD-10-CM

## 2020-05-07 NOTE — Progress Notes (Addendum)
Chronic Care Management Pharmacy Assistant   Name: Briana Barr  MRN: [id number] DOB: Dec 11, 1940  Reason for Encounter: Medication Review   PCP : [name], MD   Allergies:   Allergies  Allergen Reactions   Vioxx [Rofecoxib] Swelling    Medications: Outpatient Encounter Medications as of 05/07/2020  Medication Sig   acetaZOLAMIDE (DIAMOX) 250 MG tablet TAKE 1 TABLET BY MOUTH EVERY DAY (Patient taking differently: Take 250 mg by mouth daily.)   albuterol (PROAIR HFA) 108 (90 Base) MCG/ACT inhaler Inhale 2 puffs into the lungs every 6 (six) hours as needed for wheezing or shortness of breath.   aspirin 81 MG tablet Take 81 mg by mouth daily.   Cholecalciferol (VITAMIN D3) 250 MCG (10000 UT) capsule Take 1 capsule (10,000 Units total) by mouth daily.   ELDERBERRY PO Take 1 each by mouth daily.   escitalopram (LEXAPRO) 10 MG tablet TAKE 1 TABLET BY MOUTH EVERY DAY (Patient taking differently: Take 10 mg by mouth daily.)   Fluticasone-Umeclidin-Vilant (TRELEGY ELLIPTA) 100-62.5-25 MCG/INH AEPB Inhale 1 Inhaler into the lungs daily.   glimepiride (AMARYL) 1 MG tablet TAKE 1 TABLET BY MOUTH EVERY DAY IN THE MORNING (Patient not taking: No sig reported)   HYDROcodone-acetaminophen (NORCO/VICODIN) 5-325 MG tablet Take 1 tablet by mouth 4 (four) times daily.   ipratropium-albuterol (DUONEB) 0.5-2.5 (3) MG/3ML SOLN Take 3 mLs by nebulization every 6 (six) hours as needed. (Patient taking differently: Take 3 mLs by nebulization every 6 (six) hours as needed (SOB, wheezing).)   losartan (COZAAR) 50 MG tablet Take 1 tablet (50 mg total) by mouth daily.   metFORMIN (GLUCOPHAGE) 500 MG tablet Take 1 tablet (500 mg total) by mouth 2 (two) times daily with a meal.   Multiple Vitamins-Minerals (CENTRUM SILVER 50+WOMEN) TABS Take 1 tablet by mouth daily.   omeprazole (PRILOSEC) 20 MG capsule TAKE 1 CAPSULE BY MOUTH TWICE A DAY BEFORE A MEAL (Patient taking differently: Take 20 mg by mouth 2  (two) times daily before a meal.)   rosuvastatin (CRESTOR) 20 MG tablet TAKE 1 TABLET BY MOUTH EVERYDAY AT BEDTIME (Patient taking differently: Take 20 mg by mouth at bedtime.)   sitaGLIPtin (JANUVIA) 100 MG tablet Take 1 tablet (100 mg total) by mouth daily.   No facility-administered encounter medications on file as of 05/07/2020.    Current Diagnosis: Patient Active Problem List   Diagnosis Date Noted   CAP (community acquired pneumonia) 03/14/2020   Bacteremia 11/14/2019   Acute respiratory failure with hypoxia ([place]) 11/13/2019   COPD (chronic obstructive pulmonary disease) ([place]) 11/13/2019   Diabetes mellitus type 2 in nonobese ([place]) 11/13/2019   Essential hypertension 11/13/2019   Community acquired pneumonia of left lower lobe of lung    Abnormal CT scan, colon    Benign neoplasm of sigmoid colon    Benign neoplasm of rectum    Loss of weight    Epigastric abdominal tenderness without rebound tenderness    Anorexia     Goals Addressed   None    Reviewed chart for medication changes ahead of medication coordination call.  No OVs,  hospital visits since last Pharmacist visit.   Office Visits: 03/25/20 Dr. Lebron Conners F/U. Bud[facility] Duty Sitagliptin Phosphate 100 mg daily. 03/11/20 Dr. Dennard Schaumann For abdominal pain and foot pain. X-rays taken. No medication changes.  02/14/20 Dr. Dennard Schaumann Cyst of Kidney. No information given.   Hospital: 03/13/20 Cherylann Ratel A, DO. For SOB and Pneumonia STARTED Cefedinir 300 mg BID for 3  days.   BP Readings from Last 3 Encounters:  03/25/20 130/70  03/16/20 (!) 145/93  03/11/20 120/70    Lab Results  Component Value Date   HGBA1C 8.7 (H) 03/14/2020     Patient obtains medications through Adherence Packaging  30 Days   Last adherence delivery included:  Trelegy Aer 100 mcg Ellipta 1 puff by mouth into lungs daily Metformin 500 mg  two tablets at breakfast and evening meal Januvia 100 one tablet Breakfast Hydroco/apap 5-325 mg PRN  , 4 times daily   Patient declined meds last month: Escitalopram 10 mg one tablet at breakfast  Acetazolamide 250 mg one tablet at breakfast Omeprazole 20 mg two tablets at breakfast and evening meal  Losartan Potassium 50 mg one tablet at breakfast  Rosuvastatin 20 mg one tablet evening meal  (Enough on hand)  Patient is due for next adherence delivery on 05/14/20 .  Called patient and reviewed medications and coordinated delivery.  This delivery to include: Januvia 100 mg one tablet Breakfast  Escitalopram 10 mg one tablet at breakfast  Acetazolamide 250 mg one tablet at breakfast Metformin 500 mg two tablets at breakfast and evening meal Omeprazole 20 mg two tablets at breakfast and evening meal  Losartan Potassium 50 mg one tablet at breakfast  Rosuvastatin 20 mg one tablet evening meal  Trelegy Aer 100 mcg Ellipta 1 puff by mouth into lungs daily Hydroco/apap 5-325 mg PRN, 4 times daily  Patient needs refills on: Hydroco/apap 5-325 mg, Requested for refill already.  Confirmed delivery date of 05/14/20, advised patient that pharmacy will contact them the morning of delivery.    Follow-Up:  Pharmacist Review   Charlann Lange, RMA Clinical Pharmacist Assistant [phone]  5 minutes spent in review, coordination, and documentation.  Reviewed by: Beverly Milch, PharmD Clinical Pharmacist [employer] [facility] [phone]

## 2020-05-07 NOTE — Patient Instructions (Addendum)
Visit Information  Goals Addressed            This Visit's Progress   . Pharmacy Care Plan:       CARE PLAN ENTRY (see longitudinal plan of care for additional care plan information)  Current Barriers:  . Chronic Disease Management support, education, and care coordination needs related to Hypertension, Diabetes, and COPD   Hypertension BP Readings from Last 3 Encounters:  03/25/20 130/70  03/16/20 (!) 145/93  03/11/20 120/70   . Pharmacist Clinical Goal(s): o Over the next 120 days, patient will work with PharmD and providers to maintain BP goal <140/90 . Current regimen:  o Losartan 50mg  . Interventions: o Reviewed home blood pressure monitoring o Discussed medication adherence . Patient self care activities - Over the next 90 days, patient will: o Check BP daily, document, and provide at future appointments o Ensure daily salt intake < 2300 mg/day  Diabetes Lab Results  Component Value Date/Time   HGBA1C 8.7 (H) 03/14/2020 10:56 AM   HGBA1C 7.0 (H) 12/01/2019 12:41 PM   . Pharmacist Clinical Goal(s): o Over the next 120 days, patient will work with PharmD and providers to maintain A1c goal <8% . Current regimen:  o Metformin 500mg  bid o Januvia 100mg  daily . Interventions: o Discussed risk of hypoglycemia with advancing age and comorbidities o Reviewed home blood sugar readings o Discussed medication adherence o Updated medication list . Patient self care activities - Over the next 120 days, patient will: o Check blood sugar once daily, document, and provide at future appointments o Contact provider with any episodes of hypoglycemia  COPD . Pharmacist Clinical Goal(s) o Over the next 90 days, patient will work with PharmD and providers to optimize medication and minimize symptoms of COPD . Current regimen:   Trelegy Ellipta 100-62.5-25 mcg daily  Albuterol HFA 90 mcg prn  Duoneb 0.5-2.5 mg/ml prn . Interventions: o Reviewed frequency of inhaler  use o Discussed episodes of SOB o Counseled on proper inhaler use . Patient self care activities - Over the next 90 days, patient will: o Continue to take medications as directed o Contact providers with any new or worsening SOB  Medication management . Pharmacist Clinical Goal(s): o Over the next 90 days, patient will work with PharmD and providers to maintain optimal medication adherence . Current pharmacy: [facility] . Interventions o Comprehensive medication review performed. o Utilize [facility] for medication synchronization, packaging and delivery . Patient self care activities - Over the next 90 days, patient will: o Focus on medication adherence by switching to pill packs from [facility] o Take medications as prescribed o Report any questions or concerns to PharmD and/or provider(s)  Please see past updates related to this goal by clicking on the "Past Updates" button in the selected goal         The patient verbalized understanding of instructions, educational materials, and care plan provided today and agreed to receive a mailed copy of patient instructions, educational materials, and care plan.   Telephone follow up appointment with pharmacy team member scheduled for: 4 months  Edythe Clarity, [facility]  Diabetes Mellitus and Nutrition, Adult When you have diabetes, or diabetes mellitus, it is very important to have healthy eating habits because your blood sugar (glucose) levels are greatly affected by what you eat and drink. Eating healthy foods in the right amounts, at about the same times every day, can help you:  Control your blood glucose.  Lower your risk of heart disease.  Improve your blood pressure.  Reach or maintain a healthy weight. What can affect my meal plan? Every person with diabetes is different, and each person has different needs for a meal plan. Your health care provider may recommend that you work with a dietitian to make a meal plan that is best  for you. Your meal plan may vary depending on factors such as:  The calories you need.  The medicines you take.  Your weight.  Your blood glucose, blood pressure, and cholesterol levels.  Your activity level.  Other health conditions you have, such as heart or kidney disease. How do carbohydrates affect me? Carbohydrates, also called carbs, affect your blood glucose level more than any other type of food. Eating carbs naturally raises the amount of glucose in your blood. Carb counting is a method for keeping track of how many carbs you eat. Counting carbs is important to keep your blood glucose at a healthy level, especially if you use insulin or take certain oral diabetes medicines. It is important to know how many carbs you can safely have in each meal. This is different for every person. Your dietitian can help you calculate how many carbs you should have at each meal and for each snack. How does alcohol affect me? Alcohol can cause a sudden decrease in blood glucose (hypoglycemia), especially if you use insulin or take certain oral diabetes medicines. Hypoglycemia can be a life-threatening condition. Symptoms of hypoglycemia, such as sleepiness, dizziness, and confusion, are similar to symptoms of having too much alcohol.  Do not drink alcohol if: ? Your health care provider tells you not to drink. ? You are pregnant, may be pregnant, or are planning to become pregnant.  If you drink alcohol: ? Do not drink on an empty stomach. ? Limit how much you use to:  0-1 drink a day for women.  0-2 drinks a day for men. ? Be aware of how much alcohol is in your drink. In the [place] one drink equals one 12 oz bottle of beer (355 mL), one 5 oz glass of wine (148 mL), or one 1 oz glass of hard liquor (44 mL). ? Keep yourself hydrated with water, diet soda, or unsweetened iced tea.  Keep in mind that regular soda, juice, and other mixers may contain a lot of sugar and must be counted as  carbs. What are tips for following this plan? Reading food labels  Start by checking the serving size on the "Nutrition Facts" label of packaged foods and drinks. The amount of calories, carbs, fats, and other nutrients listed on the label is based on one serving of the item. Many items contain more than one serving per package.  Check the total grams (g) of carbs in one serving. You can calculate the number of servings of carbs in one serving by dividing the total carbs by 15. For example, if a food has 30 g of total carbs per serving, it would be equal to 2 servings of carbs.  Check the number of grams (g) of saturated fats and trans fats in one serving. Choose foods that have a low amount or none of these fats.  Check the number of milligrams (mg) of salt (sodium) in one serving. Most people should limit total sodium intake to less than 2,300 mg per day.  Always check the nutrition information of foods labeled as "low-fat" or "nonfat." These foods may be higher in added sugar or refined carbs and should be avoided.  Talk  to your dietitian to identify your daily goals for nutrients listed on the label. Shopping  Avoid buying canned, pre-made, or processed foods. These foods tend to be high in fat, sodium, and added sugar.  Shop around the outside edge of the grocery store. This is where you will most often find fresh fruits and vegetables, bulk grains, fresh meats, and fresh dairy. Cooking  Use low-heat cooking methods, such as baking, instead of high-heat cooking methods like deep frying.  Cook using healthy oils, such as olive, canola, or sunflower oil.  Avoid cooking with butter, cream, or high-fat meats. Meal planning  Eat meals and snacks regularly, preferably at the same times every day. Avoid going long periods of time without eating.  Eat foods that are high in fiber, such as fresh fruits, vegetables, beans, and whole grains. Talk with your dietitian about how many servings  of carbs you can eat at each meal.  Eat 4-6 oz (112-168 g) of lean protein each day, such as lean meat, chicken, fish, eggs, or tofu. One ounce (oz) of lean protein is equal to: ? 1 oz (28 g) of meat, chicken, or fish. ? 1 egg. ?  cup (62 g) of tofu.  Eat some foods each day that contain healthy fats, such as avocado, nuts, seeds, and fish.   What foods should I eat? Fruits Berries. Apples. Oranges. Peaches. Apricots. Plums. Grapes. Mango. Papaya. Pomegranate. Kiwi. Cherries. Vegetables Lettuce. Spinach. Leafy greens, including kale, chard, collard greens, and mustard greens. Beets. Cauliflower. Cabbage. Broccoli. Carrots. Green beans. Tomatoes. Peppers. Onions. Cucumbers. Brussels sprouts. Grains Whole grains, such as whole-wheat or whole-grain bread, crackers, tortillas, cereal, and pasta. Unsweetened oatmeal. Quinoa. Brown or wild rice. Meats and other proteins Seafood. Poultry without skin. Lean cuts of poultry and beef. Tofu. Nuts. Seeds. Dairy Low-fat or fat-free dairy products such as milk, yogurt, and cheese. The items listed above may not be a complete list of foods and beverages you can eat. Contact a dietitian for more information. What foods should I avoid? Fruits Fruits canned with syrup. Vegetables Canned vegetables. Frozen vegetables with butter or cream sauce. Grains Refined white flour and flour products such as bread, pasta, snack foods, and cereals. Avoid all processed foods. Meats and other proteins Fatty cuts of meat. Poultry with skin. Breaded or fried meats. Processed meat. Avoid saturated fats. Dairy Full-fat yogurt, cheese, or milk. Beverages Sweetened drinks, such as soda or iced tea. The items listed above may not be a complete list of foods and beverages you should avoid. Contact a dietitian for more information. Questions to ask a health care provider  Do I need to meet with a diabetes educator?  Do I need to meet with a dietitian?  What number  can I call if I have questions?  When are the best times to check my blood glucose? Where to find more information:  American Diabetes Association: diabetes.org  Academy of Nutrition and Dietetics: www.eatright.[employer] of Diabetes and Digestive and Kidney Diseases: [employer]  Association of Diabetes Care and Education Specialists: www.diabeteseducator.org Summary  It is important to have healthy eating habits because your blood sugar (glucose) levels are greatly affected by what you eat and drink.  A healthy meal plan will help you control your blood glucose and maintain a healthy lifestyle.  Your health care provider may recommend that you work with a dietitian to make a meal plan that is best for you.  Keep in mind that carbohydrates (carbs) and alcohol  have immediate effects on your blood glucose levels. It is important to count carbs and to use alcohol carefully. This information is not intended to replace advice given to you by your health care provider. Make sure you discuss any questions you have with your health care provider. Document Revised: 03/07/2019 Document Reviewed: 03/07/2019 Elsevier Patient Education  2021 [employer].

## 2020-05-08 ENCOUNTER — Other Ambulatory Visit: Payer: Self-pay | Admitting: *Deleted

## 2020-05-08 DIAGNOSIS — I10 Essential (primary) hypertension: Secondary | ICD-10-CM | POA: Diagnosis not present

## 2020-05-08 DIAGNOSIS — I82402 Acute embolism and thrombosis of unspecified deep veins of left lower extremity: Secondary | ICD-10-CM | POA: Diagnosis not present

## 2020-05-08 DIAGNOSIS — F0150 Vascular dementia without behavioral disturbance: Secondary | ICD-10-CM | POA: Diagnosis not present

## 2020-05-08 DIAGNOSIS — J188 Other pneumonia, unspecified organism: Secondary | ICD-10-CM | POA: Diagnosis not present

## 2020-05-08 DIAGNOSIS — M810 Age-related osteoporosis without current pathological fracture: Secondary | ICD-10-CM | POA: Diagnosis not present

## 2020-05-08 DIAGNOSIS — J440 Chronic obstructive pulmonary disease with acute lower respiratory infection: Secondary | ICD-10-CM | POA: Diagnosis not present

## 2020-05-08 NOTE — Telephone Encounter (Signed)
Ok to refill??  Last office visit 03/25/2020.  Last refill 04/09/2020.

## 2020-05-08 NOTE — Telephone Encounter (Signed)
-----   Message from [place] sent at 05/08/2020  9:30 AM EST ----- Regarding: Medication Refill Good morning spoke with patients daughter and she [facility] her moms Hydroco/Apap 5-325mg .

## 2020-05-09 MED ORDER — HYDROCODONE-ACETAMINOPHEN 5-325 MG PO TABS: 1.0000 | ORAL_TABLET | Freq: Four times a day (QID) | ORAL | 0 refills | Status: DC

## 2020-05-10 ENCOUNTER — Other Ambulatory Visit: Payer: Self-pay | Admitting: Family Medicine

## 2020-05-10 MED ORDER — HYDROCODONE-ACETAMINOPHEN 5-325 MG PO TABS: 1.0000 | ORAL_TABLET | Freq: Four times a day (QID) | ORAL | 0 refills | Status: DC

## 2020-05-10 NOTE — Telephone Encounter (Signed)
Please refuse as duplicate request.  

## 2020-05-14 ENCOUNTER — Other Ambulatory Visit: Payer: Self-pay

## 2020-05-14 ENCOUNTER — Other Ambulatory Visit: Payer: Medicare Other | Admitting: *Deleted

## 2020-05-14 VITALS — BP 122/60 | HR 80 | Temp 98.3°F | Resp 17

## 2020-05-14 DIAGNOSIS — Z515 Encounter for palliative care: Secondary | ICD-10-CM

## 2020-05-14 NOTE — Progress Notes (Signed)
COMMUNITY PALLIATIVE CARE RN NOTE  PATIENT NAME: Briana Barr DOB: Jan 16, 1941 MRN: [id number]  PRIMARY CARE PROVIDER: [name], MD  RESPONSIBLE PARTY: [name] (daughter-in-law) Acct ID - Guarantor Home Phone Work Phone Relationship Acct Type  [id number] MAO, LOCKNER [phone]  Self P/F     [address] [place], [place], [place] [address]   Due to the COVID-19 crisis, this virtual check-in visit was done via telephone from my office and it was initiated and consent by this patient and or family.  PLAN OF CARE and INTERVENTION:  1. ADVANCE CARE PLANNING/GOALS OF CARE: Goal is for patient to remain in her home and avoid hospitalizations. She has a DNR. 2. PATIENT/CAREGIVER EDUCATION: Symptom management, safe mobility, s/s of infection 3. DISEASE STATUS: Virtual check-in visit completed via telephone. Patient denies pain at this time. She has a very poor short term memory. She is still able to recognize her family. She is oxygen dependent on 6L/min via [place]. Her oxygen levels are usually in the mid 90s. Her levels do drop to the 70s and 80s when she takes her oxygen off to smoke. She is ambulatory using her walker. She requires assistance with her showers. She has been receiving PT through [facility]. This is her last week for therapy. No recent falls. She is able to feed herself independently. Her appetite is good. She mainly eats about 2 meals/day. No issues with dysphagia. She takes her medications without difficulty. She was recently started on Januvia for her diabetes to take in conjunction with her Metformin. This addition has brought her blood sugars down below 200. She has an appointment tomorrow with a pharmacist. Her weight has been stable per Jesy. She is sleeping well during the night. She continues to sleep around 12-16 hours per day. She denies any issues with constipation. She says that she is feeling fine. Will continue to monitor.   HISTORY OF PRESENT ILLNESS: This is  a 80 yo female with a history of HTN, COPD and DM II. Palliative care team continues to follow patient and will visit patient monthly and PRN.   CODE STATUS: DNR ADVANCED DIRECTIVES: Y MOST FORM: no PPS: 50%   (Duration of visit and documentation 30 minutes)   Daryl Eastern, RN BSN

## 2020-05-15 DIAGNOSIS — J188 Other pneumonia, unspecified organism: Secondary | ICD-10-CM | POA: Diagnosis not present

## 2020-05-15 DIAGNOSIS — I82402 Acute embolism and thrombosis of unspecified deep veins of left lower extremity: Secondary | ICD-10-CM | POA: Diagnosis not present

## 2020-05-15 DIAGNOSIS — F0150 Vascular dementia without behavioral disturbance: Secondary | ICD-10-CM | POA: Diagnosis not present

## 2020-05-15 DIAGNOSIS — M810 Age-related osteoporosis without current pathological fracture: Secondary | ICD-10-CM | POA: Diagnosis not present

## 2020-05-15 DIAGNOSIS — I10 Essential (primary) hypertension: Secondary | ICD-10-CM | POA: Diagnosis not present

## 2020-05-15 DIAGNOSIS — J440 Chronic obstructive pulmonary disease with acute lower respiratory infection: Secondary | ICD-10-CM | POA: Diagnosis not present

## 2020-05-17 ENCOUNTER — Other Ambulatory Visit: Payer: Self-pay

## 2020-05-17 ENCOUNTER — Telehealth: Payer: Self-pay

## 2020-05-17 ENCOUNTER — Other Ambulatory Visit: Payer: Self-pay | Admitting: Family Medicine

## 2020-05-17 ENCOUNTER — Encounter: Payer: Self-pay | Admitting: Family Medicine

## 2020-05-17 ENCOUNTER — Other Ambulatory Visit: Payer: Medicare Other | Admitting: *Deleted

## 2020-05-17 VITALS — BP 117/63 | HR 79 | Temp 98.5°F | Resp 18

## 2020-05-17 DIAGNOSIS — Z515 Encounter for palliative care: Secondary | ICD-10-CM

## 2020-05-17 MED ORDER — PREDNISONE 20 MG PO TABS: ORAL_TABLET | ORAL | 0 refills | Status: DC

## 2020-05-17 MED ORDER — LEVOFLOXACIN 500 MG PO TABS: 500.0000 mg | ORAL_TABLET | Freq: Every day | ORAL | 0 refills | Status: DC

## 2020-05-17 NOTE — Telephone Encounter (Signed)
Called son and explained that I will send in levaquin and prednisone based on description of her symptoms.

## 2020-05-17 NOTE — Progress Notes (Signed)
COMMUNITY PALLIATIVE CARE RN NOTE  PATIENT NAME: Briana Barr DOB: Aug 19, 1940 MRN: [id number]  PRIMARY CARE PROVIDER: [name], MD  RESPONSIBLE PARTY: [name] (daughter in law) Acct ID - Guarantor Home Phone Work Phone Relationship Acct Type  [id number] [name], [name] [phone]  Self P/F     [address] [place], [place], [place] [address]   Covid-19 Pre-screening Negative  PLAN OF CARE and INTERVENTION:  1. ADVANCE CARE PLANNING/GOALS OF CARE: Goal is for patient to remain in her home and avoid hospitalizations. She has a DNR. 2. PATIENT/CAREGIVER EDUCATION: Symptom management, safe mobility/transfers, s/s of infection 3. DISEASE STATUS: Met with patient, son, and daughter-in-law in patient's home. Upon arrival, patient is sitting up in her recliner awake and alert. She is forgetful and intermittently confused, but able to make her needs known. She denies pain at this time, but family states she c/o a pain underneath her right breast 3 days ago and again today. They are concerned because they report that when she usually complains of this pain it is usually pneumonia. No coughing noted during visit, but family states she does have a productive cough. They showed me her am sputum and it was thick, but mainly clear. It has a very slight yellowish tint to it. Recommended that she take her Mucinex to help thin it out. She also has expiratory wheezing noted to her right upper lobe. Recommended that she [place] her prn Albuterol nebulizer to help with this. She is oxygen dependent and on 6L/min via [place]. Her oxygen level today is 96% on 6L. When she removes her oxygen to smoke cigarettes, her oxygen level drops to the lower 80s. She wants to continue to smoke. She does become short of breath at times with exertion, or if she has had her oxygen off. She remains ambulatory using her walker. No recent falls. She requires assistance with bathing. Her appetite is good. She is eating 2-3 meals per day. Son  is weighing patient weekly. Last week her weight was 148.4 lbs and today 148.0 lbs. No edema. Her blood sugars have been ranging from the high 100s to low 200s. Today it is 203. She continues on Metformin. She ran out of her Januvia x 2 days and it arrived today during my visit. She has also been without her Trelegy for 2 days, and it arrived today also. I called and left and voicemail with Dr. Samella Parr nurse regarding my visit today to see if there is anything more we should do or recommend for her cough and wheezing. Awaiting a return call. Will visit patient again on Friday 05/17/20 at 11a to follow up. Will continue to monitor.   HISTORY OF PRESENT ILLNESS: This is a 80 yo female with a history of HTN, COPD and DM II. Palliative care team continues to follow patient and will visit patient monthly and PRN.    CODE STATUS: DNR ADVANCED DIRECTIVES: Y MOST FORM: no PPS: 50%   PHYSICAL EXAM:   VITALS: Today's Vitals   05/14/20 1135  BP: 122/60  Pulse: 80  Resp: 17  Temp: 98.3 F (36.8 C)  TempSrc: Temporal  SpO2: 96%  PainSc: 0-No pain    LUNGS: Expiratory wheeze to right upper lobe CARDIAC: Cor RRR EXTREMITIES: No edema SKIN: Exposed skin is dry and intact  NEURO: Alert and oriented x 2 (person/place), forgetful, intermittent confusion, ambulatory w/walker   (Duration of visit and documentation 60 minutes)   Daryl Eastern, RN BSN

## 2020-05-17 NOTE — Telephone Encounter (Signed)
Monisha, nurse with Palliatives care called to give report on how pt is doing. She stated that she did leave a message Tuesday stating that pt is having wheezing the upper right lobe and having right rib pain. Pt has tried the muscinex and albuterol inhaler, even though it made the pt feel glittery.   The new report from today states that pt now has wheezing in all the lobes of her lungs. Family reports the last time something like this happened the pt had to got the hospital with pneumonia and septics.   Pt family is asking if they can have an antibiotic to help clear it out before this gets any worse.   Nurse reports today's vitals as very good.  Values are 98.5 temp, 95% O2 with 6 L of O2, 11/63 BP and HR 79.   Please advise if sending an antibiotic is appropriate. Pt son, Ronalee Belts, would like a call back [phone]

## 2020-05-17 NOTE — Progress Notes (Signed)
COMMUNITY PALLIATIVE CARE RN NOTE  PATIENT NAME: Briana Barr DOB: 1941-03-06 MRN: [id number]  PRIMARY CARE PROVIDER: [name], MD  RESPONSIBLE PARTY: [name] (daughter in law) Acct ID - Guarantor Home Phone Work Phone Relationship Acct Type  [id number] Briana, Barr [phone]  Self P/F     [name], [name], [name] [address]   Covid-19 Pre-screening Negative  PLAN OF CARE and INTERVENTION:  1. ADVANCE CARE PLANNING/GOALS OF CARE: Goal is for patient to remain in her home with her son as caretaker and avoid hospitalizations. She has a DNR. 2. PATIENT/CAREGIVER EDUCATION: Symptom management, bowel management, safe mobility, s/s of infection 3. DISEASE STATUS: Met with patient in her home. Both sons are present during visit. Upon arrival, patient is sitting up in her recliner awake and alert. She is able to answer simple questions. She denies pain today. During my last visit this week, patient c/o pain in her right rib area underneath her breast that morning. No pain in this area since then. She continues with an occasional productive cough with thick, clear sputum with a slight yellowish tinge. She has been taking Mucinex for the past few days. Cough and sputum production about the same. They tried giving her the Albuterol nebulizer x 2 days but says that it made her very jittery and anxious so they decided not to give it yesterday or this morning. Last visit, she only had an expiratory wheeze noted to her right upper lobe. Today, the wheezing is noted throughout all lung fields with some congestion. Patient states that she feels fine, but she does look tired today. Family says that's because she is ready to lie down. She is afebrile. Family is concerned because the last time patient had a chest x-ray it didn't show anything, but then 2 days later she ended up in the hospital with sepsis pneumonia. They would like to prevent this from happening again and would like patient to  remain at home if possible and be started on some antibiotics. I called and spoke with Triage at Dr. Samella Parr office who states she will get the message back to him to see what he would like to do. Provided her with patient's son [name]'s number, as [name] is unavailable today. She is appreciative of call and report. Patient is also experiencing some constipation. This is the 3rd day of no BM. Recommended Dulcolax tablets. Son will give her one tonight to see if this helps. Denies any abdominal pain, abdomen soft/round/nondistended. No nausea/vomiting. Bowel sounds present in all 4 quadrants. Will continue to monitor.   HISTORY OF PRESENT ILLNESS: This is a 80 yo female with a history of HTN, COPD and DM II. Palliative care team continues to follow patient and will visit patient monthly and PRN.    CODE STATUS: DNR  ADVANCED DIRECTIVES: Y MOST FORM: no PPS: 50%   PHYSICAL EXAM:   VITALS: Today's Vitals   05/17/20 1137  BP: 117/63  Pulse: 79  Resp: 18  Temp: 98.5 F (36.9 C)  TempSrc: Temporal  SpO2: 95%  PainSc: 0-No pain    LUNGS: expiratory wheezes bilaterally CARDIAC: Cor RRR EXTREMITIES: No edema SKIN: Exposed skin is dry and intact  NEURO: alert and oriented x 2 (person/place), intermittent confusion, poor short term memory, ambulatory w/walker   (Duration of visit and documentation 45 minutes)   Daryl Eastern, RN BSN

## 2020-05-24 ENCOUNTER — Other Ambulatory Visit: Payer: Medicare Other | Admitting: *Deleted

## 2020-05-24 ENCOUNTER — Other Ambulatory Visit: Payer: Self-pay

## 2020-05-24 VITALS — BP 112/60 | HR 83 | Temp 98.1°F | Resp 18 | Ht 64.0 in | Wt 151.0 lb

## 2020-05-24 DIAGNOSIS — Z515 Encounter for palliative care: Secondary | ICD-10-CM

## 2020-06-03 ENCOUNTER — Telehealth: Payer: Self-pay | Admitting: Pharmacist

## 2020-06-03 ENCOUNTER — Other Ambulatory Visit: Payer: Self-pay | Admitting: *Deleted

## 2020-06-03 MED ORDER — HYDROCODONE-ACETAMINOPHEN 5-325 MG PO TABS: 1.0000 | ORAL_TABLET | Freq: Four times a day (QID) | ORAL | 0 refills | Status: DC

## 2020-06-03 NOTE — Telephone Encounter (Signed)
Ok to refill??  Last office visit 04/12/2020.  Last refill 05/10/2020.

## 2020-06-03 NOTE — Telephone Encounter (Signed)
-----   Message from Rosetta Posner sent at 06/03/2020  2:21 PM EST ----- Regarding: Medication refill Hey can you please have Dr. Dennard Schaumann send a refill for the patients Hydroco/apap 5-325 mg PRN, 4 times daily to [facility] thank you.

## 2020-06-03 NOTE — Progress Notes (Addendum)
Chronic Care Management Pharmacy Assistant   Name: Shanise Farina  MRN: [id number] DOB: Aug 26, 1940  Reason for Encounter: Medication Review  PCP : [name], MD  Allergies:   Allergies  Allergen Reactions   Vioxx [Rofecoxib] Swelling    Medications: Outpatient Encounter Medications as of 06/03/2020  Medication Sig   acetaZOLAMIDE (DIAMOX) 250 MG tablet TAKE 1 TABLET BY MOUTH EVERY DAY (Patient taking differently: Take 250 mg by mouth daily.)   albuterol (PROAIR HFA) 108 (90 Base) MCG/ACT inhaler Inhale 2 puffs into the lungs every 6 (six) hours as needed for wheezing or shortness of breath.   aspirin 81 MG tablet Take 81 mg by mouth daily.   Cholecalciferol (VITAMIN D3) 250 MCG (10000 UT) capsule Take 1 capsule (10,000 Units total) by mouth daily.   ELDERBERRY PO Take 1 each by mouth daily.   escitalopram (LEXAPRO) 10 MG tablet TAKE 1 TABLET BY MOUTH EVERY DAY (Patient taking differently: Take 10 mg by mouth daily.)   Fluticasone-Umeclidin-Vilant (TRELEGY ELLIPTA) 100-62.5-25 MCG/INH AEPB Inhale 1 Inhaler into the lungs daily.   HYDROcodone-acetaminophen (NORCO/VICODIN) 5-325 MG tablet TAKE ONE TABLET BY MOUTH FOUR TIMES DAILY   HYDROcodone-acetaminophen (NORCO/VICODIN) 5-325 MG tablet Take 1 tablet by mouth 4 (four) times daily.   ipratropium-albuterol (DUONEB) 0.5-2.5 (3) MG/3ML SOLN Take 3 mLs by nebulization every 6 (six) hours as needed. (Patient taking differently: Take 3 mLs by nebulization every 6 (six) hours as needed (SOB, wheezing).)   levofloxacin (LEVAQUIN) 500 MG tablet Take 1 tablet (500 mg total) by mouth daily.   losartan (COZAAR) 50 MG tablet Take 1 tablet (50 mg total) by mouth daily.   metFORMIN (GLUCOPHAGE) 500 MG tablet Take 1 tablet (500 mg total) by mouth 2 (two) times daily with a meal.   Multiple Vitamins-Minerals (CENTRUM SILVER 50+WOMEN) TABS Take 1 tablet by mouth daily.   omeprazole (PRILOSEC) 20 MG capsule TAKE 1 CAPSULE BY MOUTH TWICE A DAY  BEFORE A MEAL (Patient taking differently: Take 20 mg by mouth 2 (two) times daily before a meal.)   predniSONE (DELTASONE) 20 MG tablet 3 tabs poqday 1-2, 2 tabs poqday 3-4, 1 tab poqday 5-6   rosuvastatin (CRESTOR) 20 MG tablet TAKE 1 TABLET BY MOUTH EVERYDAY AT BEDTIME (Patient taking differently: Take 20 mg by mouth at bedtime.)   sitaGLIPtin (JANUVIA) 100 MG tablet Take 1 tablet (100 mg total) by mouth daily.   No facility-administered encounter medications on file as of 06/03/2020.    Current Diagnosis: Patient Active Problem List   Diagnosis Date Noted   CAP (community acquired pneumonia) 03/14/2020   Bacteremia 11/14/2019   Acute respiratory failure with hypoxia ([facility]) 11/13/2019   COPD (chronic obstructive pulmonary disease) ([facility]) 11/13/2019   Diabetes mellitus type 2 in nonobese ([facility]) 11/13/2019   Essential hypertension 11/13/2019   Community acquired pneumonia of left lower lobe of lung    Abnormal CT scan, colon    Benign neoplasm of sigmoid colon    Benign neoplasm of rectum    Loss of weight    Epigastric abdominal tenderness without rebound tenderness    Anorexia     Goals Addressed   None    Reviewed chart for medication changes ahead of medication coordination call.  No OVs, Consults, or hospital visits since last Pharmacist visit.  No medication changes indicated.  BP Readings from Last 3 Encounters:  05/17/20 117/63  05/14/20 122/60  03/25/20 130/70    Lab Results  Component Value Date  HGBA1C 8.7 (H) 03/14/2020     Patient obtains medications through Adherence Packaging  30 Days   Last adherence delivery included:  Januvia 100 mg one tablet Breakfast  Escitalopram 10 mg one tablet at breakfast  Acetazolamide 250 mg one tablet at breakfast Metformin 500 mg two tablets at breakfast and evening meal Omeprazole 20 mg two tablets at breakfast and evening meal  Losartan Potassium 50 mg one tablet at breakfast  Rosuvastatin 20 mg one tablet evening  meal  Trelegy Aer 100 mcg Ellipta 1 puff by mouth into lungs daily Hydroco/apap 5-325 mg PRN, 4 times daily  Patient declined meds last month: None  Patient is due for next adherence delivery on: 06/10/20.  Called patient and reviewed medications and coordinated delivery.  This delivery to include: Januvia 100 mg one tablet Breakfast  Escitalopram 10 mg one tablet at breakfast  Acetazolamide 250 mg one tablet at breakfast Metformin 500 mg two tablets at breakfast and evening meal Omeprazole 20 mg two tablets at breakfast and evening meal  Losartan Potassium 50 mg one tablet at breakfast  Rosuvastatin 20 mg one tablet evening meal  Trelegy Aer 100 mcg Ellipta 1 puff by mouth into lungs daily Hydroco/apap 5-325 mg PRN, 4 times daily  Patient declined the following medications: None.  Patient needs refills for: Hydroco/apap 5-325 mg PRN, 4 times daily-requested.  Confirmed delivery date of 06/10/20, advised patient that pharmacy will contact them the morning of delivery.   Follow-Up:  Coordination of Enhanced Pharmacy Services and Pharmacist Review   Charlann Lange, [place] Pharmacist Assistant [phone]  10 minutes spent in review, coordination, and documentation.  Reviewed by: Beverly Milch, PharmD Clinical Pharmacist [facility] [phone]

## 2020-06-07 ENCOUNTER — Other Ambulatory Visit: Payer: Self-pay | Admitting: Family Medicine

## 2020-06-07 NOTE — Telephone Encounter (Signed)
Medication refilled 4 days ago.

## 2020-06-07 NOTE — Telephone Encounter (Signed)
Please D/C as duplicate.

## 2020-06-10 ENCOUNTER — Other Ambulatory Visit: Payer: Self-pay | Admitting: Interventional Radiology

## 2020-06-10 DIAGNOSIS — I739 Peripheral vascular disease, unspecified: Secondary | ICD-10-CM

## 2020-06-14 ENCOUNTER — Other Ambulatory Visit: Payer: Medicare Other | Admitting: *Deleted

## 2020-06-14 ENCOUNTER — Other Ambulatory Visit: Payer: Self-pay

## 2020-06-14 VITALS — BP 117/58 | HR 83 | Temp 98.3°F | Resp 18 | Ht 64.0 in | Wt 155.2 lb

## 2020-06-14 DIAGNOSIS — Z515 Encounter for palliative care: Secondary | ICD-10-CM

## 2020-06-20 ENCOUNTER — Telehealth: Payer: Self-pay | Admitting: *Deleted

## 2020-06-20 NOTE — Telephone Encounter (Signed)
Received a communication from patient's daughter in law, [name], stating that patient is c/o pain in either her right lower lung or top of abdomen. She is requesting a RN visit. Visit scheduled for tomorrow 3/11@1230p .

## 2020-06-20 NOTE — Progress Notes (Signed)
COMMUNITY PALLIATIVE CARE RN NOTE  PATIENT NAME: Briana Barr DOB: 12-01-1940 MRN: [id number]  PRIMARY CARE PROVIDER: [name], MD  RESPONSIBLE PARTY: [name] (daughter in law) Acct ID - Guarantor Home Phone Work Phone Relationship Acct Type  [id number] ASHTYNN, BERKE [phone]  Self P/F     [address], [place], [place] [address]   Covid-19 Pre-screening Negative  PLAN OF CARE and INTERVENTION:  1. ADVANCE CARE PLANNING/GOALS OF CARE: Goal is for patient to remain in her home. She does not wish to go to the hospital for any reason. She has a DNR. 2. PATIENT/CAREGIVER EDUCATION: Symptom management, safe mobility/transfers, s/s of infection 3. DISEASE STATUS: Follow up RN visit made to assess patient's respiratory status after completion of antibiotics for possible pneumonia. She was prescribed Levaquin for 7 days and a Prednisone taper. Patient denies any pain or shortness of breath. She is taking her Albuterol nebulizer twice daily unless her heart rate is greater than 100. She only has a very slight wheeze noted to her right lower lung. Overall her lungs sound much better. Patient states that she feels fine. She continues on 6L/min via [place]. She continues to smoke. Her bowel movements have been more regular taking Colace daily. Her LBM was 2/9. She usually goes every 2-3 days. Her appetite is good. Her weight this week is 151 lbs. Her blood sugar today is 249. She is on Januvia and Metformin. Family is appreciative of visit. Will continue to monitor.   HISTORY OF PRESENT ILLNESS: This is a 80 yo female with a history of HTN, COPD and DM II. Palliative care teamcontinues to follow patient and will visit patient monthly and PRN.   CODE STATUS: DNR ADVANCED DIRECTIVES: Y MOST FORM: no PPS: 50%   PHYSICAL EXAM:   VITALS: Today's Vitals   05/24/20 1111  BP: 112/60  Pulse: 83  Resp: 18  Temp: 98.1 F (36.7 C)  TempSrc: Temporal  SpO2: 96%  Weight: 151 lb (68.5  kg)  Height: 5\' 4"  (1.626 m)  PainSc: 0-No pain    LUNGS: Very slight expiratory wheeze to right lower lung (much improved overall), no shortness of breath CARDIAC: Cor RRR EXTREMITIES: No edema SKIN: Skin color, texture, turgor normal. No rashes or lesions  NEURO: Alert and oriented to person/place, intermittent confusion and forgetfulness, ambulatory   (Duration of visit and documentation 45 minutes)   Daryl Eastern, RN BSN

## 2020-06-21 ENCOUNTER — Other Ambulatory Visit: Payer: Medicare Other | Admitting: *Deleted

## 2020-06-21 ENCOUNTER — Other Ambulatory Visit: Payer: Self-pay

## 2020-06-21 VITALS — BP 138/68 | HR 79 | Temp 97.8°F | Resp 20

## 2020-06-21 DIAGNOSIS — Z515 Encounter for palliative care: Secondary | ICD-10-CM

## 2020-06-28 ENCOUNTER — Other Ambulatory Visit: Payer: Medicare Other | Admitting: *Deleted

## 2020-06-28 ENCOUNTER — Other Ambulatory Visit: Payer: Self-pay

## 2020-06-28 VITALS — BP 122/63 | HR 75 | Temp 99.2°F | Resp 18 | Ht 64.0 in | Wt 157.0 lb

## 2020-06-28 DIAGNOSIS — Z515 Encounter for palliative care: Secondary | ICD-10-CM

## 2020-07-01 ENCOUNTER — Other Ambulatory Visit: Payer: Self-pay

## 2020-07-01 ENCOUNTER — Other Ambulatory Visit: Payer: Medicare Other | Admitting: *Deleted

## 2020-07-01 VITALS — BP 119/63 | HR 83 | Temp 98.2°F | Resp 19 | Ht 64.0 in | Wt 152.4 lb

## 2020-07-01 DIAGNOSIS — Z515 Encounter for palliative care: Secondary | ICD-10-CM

## 2020-07-01 NOTE — Progress Notes (Signed)
COMMUNITY PALLIATIVE CARE RN NOTE  PATIENT NAME: Briana Barr DOB: 11-06-1940 MRN: [id number]  PRIMARY CARE PROVIDER: [name], MD  RESPONSIBLE PARTY: [name] (daughter in law) Acct ID - Guarantor Home Phone Work Phone Relationship Acct Type  [id number] ELIANI, LECLERE [phone]  Self P/F     [address], [place], [place] [address]   Covid-19 Pre-screening Negative  PLAN OF CARE and INTERVENTION:  1. ADVANCE CARE PLANNING/GOALS OF CARE: Goal is for patient to remain in her home and avoid hospitalizations. She has a DNR. 2. PATIENT/CAREGIVER EDUCATION: Symptom management, safe mobility, s/s of infection 3. DISEASE STATUS: Met with patient, son and daughter in law in the home. Upon arrival, patient is sitting up in her recliner awake and alert. She is able to answer simple questions but if intermittently confused and more forgetful. She requires more frequent prompting and direction with all ADLs. She continues on oxygen at 6L/min via [place] continuously. She continues to smoke, but does remove her oxygen during this time. Some shortness of breath with exertion. She has a productive cough. Phlegm is clear. She has to be reminded to spit it out vs swallowing it. She does sometimes wear a cpap for about an hour while napping during the day. Some days she will take it off stating that it makes her throat too dry. She is doing her Albuterol nebulizers 1-2x/day. She has not had to utilize her rescue inhaler. She has not missed any doses of her medication. She has been having some issues with pain in her left ear when bending over the sink when getting her hair washed. No complaints at any other time. She has required her ears to be irrigated over a year ago. Her appetite and fluid intake is good. Her blood sugars at it's lowest over the past 2 weeks have been 184 and the highest reading was 261. She continues on Januvia and Metformin. Her bowel movements have been more regular with Colace.  Last bowel movement was yesterday. She has positive bowel sounds in all 4 quadrants, but she did have some tenderness and grimacing when palpating her left upper quadrant. Mild distention present in this area. She is afebrile. Unsure of cause. Will continue to monitor this. The initial report was that she c/o pain in her right upper abdomen while sitting on the commode, but she denies at this time.   HISTORY OF PRESENT ILLNESS: This is a 80 yo female with a history of HTN, COPD and DM II. Palliative care teamcontinues to follow patient and will visit patient monthly and PRN.     CODE STATUS: DNR ADVANCED DIRECTIVES: Y MOST FORM: no PPS: 50%   PHYSICAL EXAM:   VITALS: Today's Vitals   06/14/20 2254  BP: (!) 117/58  Pulse: 83  Resp: 18  Temp: 98.3 F (36.8 C)  TempSrc: Temporal  SpO2: 96%  Weight: 155 lb 3.2 oz (70.4 kg)  Height: _0  (1.626 m)  PainSc: 2   PainLoc: Abdomen    LUNGS: expiratory wheezes bilaterally CARDIAC: Cor RRR EXTREMITIES: No edema SKIN: Exposed skin is dry and intact  NEURO: Alert and oriented to person/place, intermittent confusion, forgetful, HOH, generalized weakness, ambulatory w/walker   (Duration of visit and documentation 60 minutes)   Daryl Eastern, RN BSN

## 2020-07-01 NOTE — Progress Notes (Signed)
COMMUNITY PALLIATIVE CARE RN NOTE  PATIENT NAME: Briana Barr DOB: 07/19/1940 MRN: [id number]  PRIMARY CARE PROVIDER: [name], MD  RESPONSIBLE PARTY: [name] (daughter in law) Acct ID - Guarantor Home Phone Work Phone Relationship Acct Type  [id number] [name], [name] [phone]  Self P/F     [address], [place], [place] [address]   Covid-19 Pre-screening Negative  PLAN OF CARE and INTERVENTION:  1. ADVANCE CARE PLANNING/GOALS OF CARE: Goal is for patient to remain in her home and avoid hospitalizations. She has a DNR. 2. PATIENT/CAREGIVER EDUCATION: Symptom management, safe mobility/transfers, s/s of infection 3. DISEASE STATUS: Met with patient and her son in their home. Upon arrival, patient is sitting up in her recliner awake and alert. She denies pain. Son is noticing increased patient confusion and forgetfulness. He is having to prompt and cue her more during ADLs. She is able to answer simple questions. Worsening hearing per son. They are concerned that she may have wax buildup. They will reach out to PCP to see if she may require ear irrigation, or if there is another cause of this issue. She does wear hearing aids which do help, but not at the same degree as before. She had just finished smoking a cigarette. Family does remove her oxygen while she does this. Her oxygen on room air was 82-83% on room air, but did come up to 96% on 6L. She is receiving her Albuterol nebulizers 1-2x/day. They will hold it if her pulse is elevated over 100. No shortness of breath noted while patient is at rest. She does become short of breath with exertion. Son has noticed that her cough has been less productive. Some throat congestion heard. She was able to expectorate thick yellow phlegm. This was the first time today she has coughed anything up. Recommended trying her Mucinex for the next 3 days and I will follow up with family again on Monday. She does have expiratory wheezing noted in  all lung fields. Her appetite remains good. Her blood sugar today was 203. Her weight this week is 157 lbs. It has remained pretty stable. She had an incident on Sunday where her right hand was very shaky and he decided to stay home from church. Unsure of cause. It did resolve on it's own. Her bowel movements have been more regular taking Colace daily. She has a small, red circular area noted to her right cheek. This is the area of a scratch that had healed. He treated it with Neosporin and covered it with a band-aid. When he removed the band-aid, it raised some of her skin making this red area. She denies any pain or irritation. Will continue to monitor.  HISTORY OF PRESENT ILLNESS: This is a 80 yo female with a history of HTN, COPD and DM II. Palliative care teamcontinues to follow patient and will visit patient monthly and PRN.    CODE STATUS: DNR ADVANCED DIRECTIVES: Y MOST FORM: no PPS: 50%   PHYSICAL EXAM:   VITALS: Today's Vitals   06/28/20 1121  BP: 122/63  Pulse: 75  Resp: 18  Temp: 99.2 F (37.3 C)  TempSrc: Temporal  SpO2: 96%  Weight: 157 lb (71.2 kg)  Height: _0  (1.626 m)  PainSc: 0-No pain    LUNGS: expiratory wheezes bilaterally CARDIAC: Cor RRR EXTREMITIES: No edema SKIN: See above note; exposed skin is dry and intact  NEURO: Alert and oriented to self/place, increased confusion and forgetfulness, HOH (wears hearing aids), generalized weakness, ambulatory  w/walker    (Duration of visit and documentation 45 minutes)   Daryl Eastern, RN BSN

## 2020-07-01 NOTE — Progress Notes (Signed)
COMMUNITY PALLIATIVE CARE RN NOTE  PATIENT NAME: Briana Barr DOB: 08-16-1940 MRN: [id number]  PRIMARY CARE PROVIDER: [name], MD  RESPONSIBLE PARTY: [name] (daughter in law) Acct ID - Guarantor Home Phone Work Phone Relationship Acct Type  [id number] SHANAYE, RIEF [phone]  Self P/F     [address], [place], [place] [address]   Covid-19 Pre-screening Negative  PLAN OF CARE and INTERVENTION:  1. ADVANCE CARE PLANNING/GOALS OF CARE: Goal is for patient to remain in her home and avoid hospitalizations. She has a DNR. 2. PATIENT/CAREGIVER EDUCATION: Symptom management, pain management, safe mobility, s/s of infection 3. DISEASE STATUS: Met with patient, daughter-in-law and son in the home. Jesy requested a PRN visit due to patient c/o pain in either her right lower lung or right top of abdomen. [name] states that when she visited this week she was holding the area underneath her right breast. She is denying pain in this area today. She is concerned because in the past when patient experiences this pain she has ended up with pneumonia. She does have expiratory wheezing present indicative of her COPD. She continues on her Albuterol nebs 1-2x/day. She does become short of breath with exertion, noticed after patient ambulated with her walker back from the bathroom today. She is able to recover at rest within 30-60 seconds. Son states she is not smoking as much, only 1-2 cigarettes per day due to decline in cognition and forgetfulness. They only give her a cigarette when she requests. Son states she is performing daily leg and arm exercises. Her bowel movements have been regular. She usually goes every 3 days. She seems more tired during visit today, but this is usually the time she lies down for a nap. Will continue to monitor.   HISTORY OF PRESENT ILLNESS: This is a 80 yo female with a history of HTN, COPD and DM II. Palliative care teamcontinues to follow patient and will visit  patient monthly and PRN.     CODE STATUS: DNR ADVANCED DIRECTIVES: Y MOST FORM: no PPS: 50%   PHYSICAL EXAM:   VITALS: Today's Vitals   06/21/20 1250  BP: 138/68  Pulse: 79  Resp: 20  Temp: 97.8 F (36.6 C)  TempSrc: Temporal  SpO2: 96%  PainSc: 4   PainLoc: Abdomen    LUNGS: expiratory wheezes bilaterally CARDIAC: Cor RRR EXTREMITIES: No edema SKIN: Exposed skin is dry and intact  NEURO: Alert and oriented x 2, HOH, intermittent confusion/forgetfulness, generalized weakness, ambulatory w/walker   (Duration of visit and documentation 45 minutes)   Daryl Eastern, RN BSN

## 2020-07-02 ENCOUNTER — Ambulatory Visit
Admission: RE | Admit: 2020-07-02 | Discharge: 2020-07-02 | Disposition: A | Discharge status: Home / Self Care | Payer: Medicare Other | Source: Ambulatory Visit | Attending: Interventional Radiology | Admitting: Interventional Radiology

## 2020-07-02 ENCOUNTER — Other Ambulatory Visit: Payer: Self-pay | Admitting: *Deleted

## 2020-07-02 ENCOUNTER — Encounter: Payer: Self-pay | Admitting: *Deleted

## 2020-07-02 ENCOUNTER — Telehealth: Payer: Self-pay | Admitting: Pharmacist

## 2020-07-02 DIAGNOSIS — I739 Peripheral vascular disease, unspecified: Secondary | ICD-10-CM

## 2020-07-02 HISTORY — PX: IR RADIOLOGIST EVAL & MGMT: IMG5224

## 2020-07-02 MED ORDER — HYDROCODONE-ACETAMINOPHEN 5-325 MG PO TABS: 1.0000 | ORAL_TABLET | Freq: Four times a day (QID) | ORAL | 0 refills | Status: DC

## 2020-07-02 NOTE — Progress Notes (Addendum)
Chronic Care Management Pharmacy Assistant   Name: Sydnie Craver  MRN: [id number] DOB: [birth year]  Reason for Encounter: Medication Review and Disease Call For DM.   Medications: Outpatient Encounter Medications as of 07/02/2020  Medication Sig   acetaZOLAMIDE (DIAMOX) 250 MG tablet TAKE 1 TABLET BY MOUTH EVERY DAY (Patient taking differently: Take 250 mg by mouth daily.)   albuterol (PROAIR HFA) 108 (90 Base) MCG/ACT inhaler Inhale 2 puffs into the lungs every 6 (six) hours as needed for wheezing or shortness of breath.   aspirin 81 MG tablet Take 81 mg by mouth daily.   Cholecalciferol (VITAMIN D3) 250 MCG (10000 UT) capsule Take 1 capsule (10,000 Units total) by mouth daily.   ELDERBERRY PO Take 1 each by mouth daily.   escitalopram (LEXAPRO) 10 MG tablet TAKE 1 TABLET BY MOUTH EVERY DAY (Patient taking differently: Take 10 mg by mouth daily.)   Fluticasone-Umeclidin-Vilant (TRELEGY ELLIPTA) 100-62.5-25 MCG/INH AEPB Inhale 1 Inhaler into the lungs daily.   HYDROcodone-acetaminophen (NORCO/VICODIN) 5-325 MG tablet TAKE ONE TABLET BY MOUTH FOUR TIMES DAILY   ipratropium-albuterol (DUONEB) 0.5-2.5 (3) MG/3ML SOLN Take 3 mLs by nebulization every 6 (six) hours as needed. (Patient taking differently: Take 3 mLs by nebulization every 6 (six) hours as needed (SOB, wheezing).)   levofloxacin (LEVAQUIN) 500 MG tablet Take 1 tablet (500 mg total) by mouth daily.   losartan (COZAAR) 50 MG tablet Take 1 tablet (50 mg total) by mouth daily.   metFORMIN (GLUCOPHAGE) 500 MG tablet Take 1 tablet (500 mg total) by mouth 2 (two) times daily with a meal.   Multiple Vitamins-Minerals (CENTRUM SILVER 50+WOMEN) TABS Take 1 tablet by mouth daily.   omeprazole (PRILOSEC) 20 MG capsule TAKE 1 CAPSULE BY MOUTH TWICE A DAY BEFORE A MEAL (Patient taking differently: Take 20 mg by mouth 2 (two) times daily before a meal.)   predniSONE (DELTASONE) 20 MG tablet 3 tabs poqday 1-2, 2 tabs poqday 3-4, 1 tab poqday 5-6    rosuvastatin (CRESTOR) 20 MG tablet TAKE 1 TABLET BY MOUTH EVERYDAY AT BEDTIME (Patient taking differently: Take 20 mg by mouth at bedtime.)   sitaGLIPtin (JANUVIA) 100 MG tablet Take 1 tablet (100 mg total) by mouth daily.   No facility-administered encounter medications on file as of 07/02/2020.    Reviewed chart for medication changes ahead of medication coordination call.  No OVs, Consults, or hospital visits since last care coordination call.  No medication changes indicated.  BP Readings from Last 3 Encounters:  06/28/20 122/63  06/21/20 138/68  06/14/20 (!) 117/58    Lab Results  Component Value Date   HGBA1C 8.7 (H) 03/14/2020     Patient obtains medications through Adherence Packaging  30 Days   Last adherence delivery included:  Januvia 100 mg one tablet Breakfast  Escitalopram 10 mg one tablet at breakfast  Acetazolamide 250 mg one tablet at breakfast Metformin 500 mg two tablets at breakfast and evening meal Omeprazole 20 mg two tablets at breakfast and evening meal  Losartan Potassium 50 mg one tablet at breakfast  Rosuvastatin 20 mg one tablet evening meal  Trelegy Aer 100 mcg Ellipta 1 puff by mouth into lungs daily Hydroco/apap 5-325 mg PRN, 4 times daily  Patient declined meds last month: Done.  Patient is due for next adherence delivery on: 3/38/22.  Called patient and reviewed medications and coordinated delivery.  This delivery to include: Januvia 100 mg one tablet Breakfast  Escitalopram 10 mg one tablet at breakfast  Acetazolamide 250 mg one tablet at breakfast Metformin 500 mg two tablets at breakfast and evening meal Omeprazole 20 mg two tablets at breakfast and evening meal  Losartan Potassium 50 mg one tablet at breakfast  Rosuvastatin 20 mg one tablet evening meal  Trelegy Aer 100 mcg Ellipta 1 puff by mouth into lungs daily Hydroco/apap 5-325 mg PRN, 4 times daily  Patient declined the following medications: None  Patient needs  refills for: Hydroco/apap 5-325 mg PRN, 4 times daily, requested.   Confirmed delivery date of 3/38/22, advised patient that pharmacy will contact them the morning of delivery.  Recent Relevant Labs: Lab Results  Component Value Date/Time   HGBA1C 8.7 (H) 03/14/2020 10:56 AM   HGBA1C 7.0 (H) 12/01/2019 12:41 PM   MICROALBUR 1.7 01/12/2019 10:17 AM   MICROALBUR 1.2 12/07/2017 09:39 AM    Kidney Function Lab Results  Component Value Date/Time   CREATININE 0.51 03/16/2020 07:09 AM   CREATININE 0.55 03/15/2020 06:33 AM   CREATININE 0.60 12/01/2019 12:41 PM   CREATININE 0.61 01/12/2019 10:17 AM   GFRNONAA >60 03/16/2020 07:09 AM   GFRNONAA 87 12/01/2019 12:41 PM   GFRAA 101 12/01/2019 12:41 PM    Current antihyperglycemic regimen:  Metformin 500 mg bid Januvia 100 mg daily  What recent interventions/DTPs have been made to improve glycemic control:  None  Have there been any recent hospitalizations or ED visits since last visit with CPP? Patient daughter stated no.  Patients daughter in law  denies hypoglycemic symptoms, including None   Patients daughter in law  denies hyperglycemic symptoms, including none   How often are you checking your blood sugar? once daily   What are your blood sugars ranging?  Patients daughter in law was outside was not able to give me any blood sugar readings.  During the week, how often does your blood glucose drop below 70? Patients daughter in law stated Never   Are you checking your feet daily/regularly?  Patients daughter in law stated her feet are checked regularly.   Adherence Review: Is the patient currently on a STATIN medication? Yes, Rosuvastatin 20 mg  Is the patient currently on ACE/ARB medication? Yes, Losartan 50 mg  Does the patient have >5 day gap between last estimated fill dates? Per misc rpts, no.  Follow-Up:Pharmacist Review  Charlann Lange, RMA Clinical Pharmacist Assistant [phone]  10 minutes spent in  review, coordination, and documentation.  Reviewed by: Beverly Milch, PharmD Clinical Pharmacist [facility] [phone]

## 2020-07-02 NOTE — Telephone Encounter (Signed)
Ok to refill??  Last office visit 04/12/2020.  Last refill 06/10/2020.

## 2020-07-02 NOTE — Progress Notes (Signed)
Chief Complaint: PAD  Referring Physician(s): Dr. Boneta Lucks  History of Present Illness: Briana Barr is a 80 y.o. female presenting today as a scheduled follow up to [place] [facility] with known PAD history.   [name] joins [place] today with her daughter in the clinic.     Since we last saw her in February of last year, she has had a few hospitalizations, although she assures me she has recovered from the last.    Since then, she denies any history of interval wound development of her lower extremities, and she does not currently have a wound.  She says she lives with her son, and he takes "care of my feet like a baby doll."    I cannot elicit any history of resting pain.    She is not very active during the day, only out of bet about 7-8 hours per her daughter.  She does not ambulate very far, and I cannot elicit a history of claudication.  She only ambulated around the house, without symptoms.   Her days are spent without much activity her daughter given her pulmonary disease.  She does continue to smoke, but I am told less than half-pack per day.   She denies any chest pain.   Non-invasive today is essentially unchanged from the last: Right ABI: 0.62, with segmental exam showing likely fem-pop occlusion, and early iliac disease Left ABI: 0.99  She has history of prior left fem-pop bypass, unknown location.   Past Medical History:  Diagnosis Date  . Barrett esophagus   . Colon polyps    benign  . COPD (chronic obstructive pulmonary disease) ([place])   . Duodenal ulcer   . DVT (deep venous thrombosis) ([facility])    left leg x 2  . Gallstones   . GERD (gastroesophageal reflux disease)   . Glaucoma   . Hyperlipidemia   . Ischemic colitis ([place])   . Osteoporosis   . Pneumonia    hx of x 2 as a child   . Renal cyst, right   . Retinopathy, background, proliferative     Past Surgical History:  Procedure Laterality Date  . cervical vertebral fusion     c5-6, 6-7  .  CHOLECYSTECTOMY    . COLONOSCOPY WITH PROPOFOL N/A 11/23/2016   Procedure: COLONOSCOPY WITH PROPOFOL;  Surgeon: Ladene Artist, MD;  Location: [facility];  Service: Endoscopy;  Laterality: N/A;  . ESOPHAGOGASTRODUODENOSCOPY (EGD) WITH PROPOFOL N/A 11/23/2016   Procedure: ESOPHAGOGASTRODUODENOSCOPY (EGD) WITH PROPOFOL;  Surgeon: Ladene Artist, MD;  Location: [facility];  Service: Endoscopy;  Laterality: N/A;  . FEMORAL-POPLITEAL BYPASS GRAFT    . IR RADIOLOGIST EVAL & MGMT  06/06/2019    Allergies: Vioxx [rofecoxib]  Medications: Prior to Admission medications   Medication Sig Start Date End Date Taking? Authorizing Provider  acetaZOLAMIDE (DIAMOX) 250 MG tablet TAKE 1 TABLET BY MOUTH EVERY DAY Patient taking differently: Take 250 mg by mouth daily. 02/13/20   [name], MD  albuterol (PROAIR HFA) 108 (90 Base) MCG/ACT inhaler Inhale 2 puffs into the lungs every 6 (six) hours as needed for wheezing or shortness of breath. 03/06/19   Alycia Rossetti, MD  aspirin 81 MG tablet Take 81 mg by mouth daily.    [provider]  Cholecalciferol (VITAMIN D3) 250 MCG (10000 UT) capsule Take 1 capsule (10,000 Units total) by mouth daily. 01/20/19   [name], MD  ELDERBERRY PO Take 1 each by mouth daily.  [provider]  escitalopram (LEXAPRO) 10 MG tablet TAKE 1 TABLET BY MOUTH EVERY DAY Patient taking differently: Take 10 mg by mouth daily. 02/15/20   [name], MD  Fluticasone-Umeclidin-Vilant (TRELEGY ELLIPTA) 100-62.5-25 MCG/INH AEPB Inhale 1 Inhaler into the lungs daily. 02/29/20   [name], MD  HYDROcodone-acetaminophen (NORCO/VICODIN) 5-325 MG tablet Take 1 tablet by mouth 4 (four) times daily. 07/02/20   [name], MD  ipratropium-albuterol (DUONEB) 0.5-2.5 (3) MG/3ML SOLN Take 3 mLs by nebulization every 6 (six) hours as needed. Patient taking differently: Take 3 mLs by nebulization every 6 (six) hours as needed (SOB, wheezing).  09/28/19   [name], MD  levofloxacin (LEVAQUIN) 500 MG tablet Take 1 tablet (500 mg total) by mouth daily. 05/17/20   [name], MD  losartan (COZAAR) 50 MG tablet Take 1 tablet (50 mg total) by mouth daily. 02/02/20   [name], MD  metFORMIN (GLUCOPHAGE) 500 MG tablet Take 1 tablet (500 mg total) by mouth 2 (two) times daily with a meal. 02/02/20   [name], MD  Multiple Vitamins-Minerals (CENTRUM SILVER 50+WOMEN) TABS Take 1 tablet by mouth daily. 01/20/19   [name], MD  omeprazole (PRILOSEC) 20 MG capsule TAKE 1 CAPSULE BY MOUTH TWICE A DAY BEFORE A MEAL Patient taking differently: Take 20 mg by mouth 2 (two) times daily before a meal. 02/02/20   [name], MD  predniSONE (DELTASONE) 20 MG tablet 3 tabs poqday 1-2, 2 tabs poqday 3-4, 1 tab poqday 5-6 05/17/20   [name], MD  rosuvastatin (CRESTOR) 20 MG tablet TAKE 1 TABLET BY MOUTH EVERYDAY AT BEDTIME Patient taking differently: Take 20 mg by mouth at bedtime. 02/12/20   [name], MD  sitaGLIPtin (JANUVIA) 100 MG tablet Take 1 tablet (100 mg total) by mouth daily. 03/25/20   [name], MD     Family History  Problem Relation Age of Onset  . Other Father        some sort of blood cancer-had to get transfusions  . Stomach cancer Maternal Grandmother   . Stomach cancer Maternal Grandfather     Social History   Socioeconomic History  . Marital status: Widowed    Spouse name: Not on file  . Number of children: 3  . Years of education: Not on file  . Highest education level: Not on file  Occupational History  . Occupation: retired  Tobacco Use  . Smoking status: Current Every Day Smoker    Packs/day: 1.00    Types: Cigarettes  . Smokeless tobacco: Never Used  . Tobacco comment: started age 81  Vaping Use  . Vaping Use: Never used  Substance and Sexual Activity  . Alcohol use: No  . Drug use: No  . Sexual activity: Not on file  Other Topics Concern  .  Not on file  Social History Narrative  . Not on file   Social Determinants of Health   Financial Resource Strain: Low Risk   . Difficulty of Paying Living Expenses: Not very hard  Food Insecurity: Not on file  Transportation Needs: Not on file  Physical Activity: Not on file  Stress: Not on file  Social Connections: Not on file       Review of Systems: A 12 point ROS discussed and pertinent positives are indicated in the HPI above.  All other systems are negative.  Review of Systems  Vital Signs: There were no vitals taken  for this visit.  Physical Exam General: 80 yo female appearing stated age.  Well-developed, well-nourished.  No distress. HEENT: Atraumatic, normocephalic. Glasses.  Conjugate gaze, extra-ocular motor intact. No scleral icterus or scleral injection. No lesions on external ears, nose, lips, or gums.  Oral mucosa moist, pink. Wearing O2 cannula.  Neck: Symmetric with no goiter enlargement.  Chest/Lungs:  Symmetric chest with inspiration/expiration.  No labored breathing.   Heart:  No JVD appreciated.  Abdomen:  Soft, NT/ND, with + bowel sounds.   Genito-urinary: Deferred Neurologic: Alert & Oriented to person, place, and time.   Normal affect and insight.  Appropriate questions.  Moving all 4 extremities with gross sensory intact.  Pulse Exam:  Doppler positive signal in the right AT and PT, as well as the left AT and PT.  Extremities: No wound.  Hairless.  Dry, flaking skin on the right plantar surface  Mallampati Score:     Imaging: No results found.  Labs:  CBC: Recent Labs    03/14/20 0000 03/14/20 1056 03/15/20 0633 03/16/20 0709  WBC 17.0* 14.8* 11.7* 10.7*  HGB 11.5* 11.0* 11.7* 11.6*  HCT 37.8 36.4 38.5 38.5  PLT 461* 390 405* 464*    COAGS: Recent Labs    11/12/19 2334 03/14/20 0000  INR 1.2 1.1  APTT 43* 37*    BMP: Recent Labs    11/18/19 0442 11/19/19 0450 11/20/19 0434 12/01/19 1241 03/14/20 0000 03/14/20 1056  03/15/20 0633 03/16/20 0709  NA 141 139 134* 141 136  --  139 137  K 4.0 4.1 4.0 4.4 3.8  --  3.3* 3.9  CL 101 100 96* 102 102  --  103 101  CO2 29 29 29 31 25   --  25 25  GLUCOSE 165* 226* 196* 170* 291*  --  211* 206*  BUN 27* 25* 26* 27* 26*  --  10 17  CALCIUM 9.1 8.7* 8.4* 9.6 8.6*  --  9.1 8.9  CREATININE 0.61 0.67 0.49 0.60 0.63 0.53 0.55 0.51  GFRNONAA >60 >60 >60 87 >60 >60 >60 >60  GFRAA >60 >60 >60 101  --   --   --   --     LIVER FUNCTION TESTS: Recent Labs    11/12/19 2334 12/01/19 1241 03/14/20 0000 03/15/20 0633 03/16/20 0709  BILITOT 0.6 0.4 0.4 0.6 0.5  AST 17 17 17 23 26   ALT 18 17 19 26  34  ALKPHOS 57  --  55 50 63  PROT 7.1 6.7 7.0 6.8 7.4  ALBUMIN 3.6  --  3.6 3.3* 3.7    TUMOR MARKERS: No results for input(s): AFPTM, CEA, CA199, CHROMGRNA in the last 8760 hours.  Assessment and Plan:  Assessment:  Ms Totino is a 80yo female with advanced pulmonary disease, multiple CV risk factors (including long-time smoking/continued smoking and DM), with bilateral PAD.  She is asymptomatic given her relative absence of activity.   Non-invasive lower extremity exam and imaging work-up shows evidence of right sided fem-pop occlusion, early right iliac disease, and patent left fem-pop bypass.     I had discussion with her and her family today in the clinic regarding anatomy, pathology/pathophysiology, natural history, and prognosis of PAD/CLI.  Informed consent regarding treatment strategies was performed which would possibly include medical management, endovascular options, with risk/benefit discussion.  The indications for treatment supported by updated guidelines1, 2 were discussed.  I did let her know that my impression is that there is no need to treat without symptoms.  Given the presence of diabetes, healthy foot care was also discussed, in accord with multi-disciplinary, Class 1 recommendations.1,3 Current recommendations advocate daily foot inspection,  good nail care, avoiding barefoot walking, properly fitted footwear, seeking care with problems, and continuing routine podiatric care with at least annual inspection3.     Continued maximal medical therapy for reduction of risk factors is indicated as recommended by updated AHA guidelines1.  This includes anti-platelet medication, tight blood glucose control to a HbA1c < 7, tight blood pressure control, maximum-dose HMG-CoA reductase inhibitor, and smoking cessation.  Smoking cessation was addressed, with strategies including counseling and nicotine replacement.  She seems satisfied at this time with her success in cutting back to "less than 1/2 pack per day."  I did encourage complete cessation.   Annual flu vaccination is also recommended, with Class 1 recommendation1.   Plan: - surveillance ABI is recommended, which we will order for next year.  We discussed holding off on any office visit at this time, which she and her daughter agree with, and they know they can contact us at any time if she has a new foot wound. .  -Continue maximal medical therapy for cardiovascular risk reduction, including anti-platelet therapy. -Recommend initiating smoking cessation measures, with options discussed.  -Observe healthy foot care habits, given the presence of diabetes, with at least annual foot inspection performed in the setting of DM.        ___________________________________________________________________   1Morley Kos MD, et al. 2016 AHA/ACC Guideline on the Management of Patients With Lower Extremity Peripheral Artery Disease: Executive Summary: A Report of the American College of Cardiology/American Heart Association Task Force on Clinical Practice Guidelines. J Am Coll Cardiol. 2017 Mar 21;69(11):1465-1508. doi: 10.1016/j.jacc.2016.11.008.   2 - Norgren L, et al. TASC II Working Group. Inter-society consensus for the management of peripheral arterial disease. Int Tressia Miners. 2007  Jun;26(2):81-157. Review. PubMed PMID: 21194174  3 - Hingorani A, et al. The management of diabetic foot: A clinical practice guideline by the Society for Vascular Surgery in collaboration with the [place] and the Society  for Vascular Medicine. J Vasc Surg. 2016 Feb;63(2 Suppl):3S-21S. doi: 10.1016/j.jvs.2015.10.003. PubMed PMID: 08144818.  4 - Corinna Gab, Saab FA, Luberta Mutter, Grant Ruts, Ewell Poe, Driver VR, [place], Lookstein R, van den Baldemar Lenis, Jaff MR, Guadalupe Dawn, Henao S, AlMahameed A, Katzen B. Digital Subtraction Angiography Prior to an Amputation for Critical Limb Ischemia (CLI): An Expert Recommendation Statement From the CLI Global Society to Optimize Limb Salvage. J Endovasc Ther. 2020 Aug;27(4):540-546. doi: 10.1177/1526602820928590. Epub 2020 May 29. PMID: 56314970.    Thank you for this interesting consult.  I greatly enjoyed meeting Thomasena Spurgin and look forward to participating in their care.  A copy of this report was sent to the requesting provider on this date.  Electronically Signed: Corrie Mckusick 07/02/2020, 12:00 PM   I spent a total of    25 Minutes in face to face in clinical consultation, greater than 50% of which was counseling/coordinating care for PAD, right sided moderate range severity.

## 2020-07-02 NOTE — Telephone Encounter (Signed)
-----   Message from Rosetta Posner sent at 07/02/2020  9:45 AM EDT ----- Regarding: Medication Refill Good Morning, Spoke with the patients daughter in law and Briana Barr needs a refill on her Hydroco/apap 5-325 mgPRN, 4 times daily. Please send it to [facility], Thank you.

## 2020-07-18 ENCOUNTER — Other Ambulatory Visit: Payer: Self-pay

## 2020-07-18 ENCOUNTER — Telehealth: Payer: Self-pay | Admitting: Family Medicine

## 2020-07-18 ENCOUNTER — Other Ambulatory Visit: Payer: Medicare Other | Admitting: *Deleted

## 2020-07-18 VITALS — BP 137/68 | HR 86 | Temp 98.4°F | Resp 18

## 2020-07-18 DIAGNOSIS — Z515 Encounter for palliative care: Secondary | ICD-10-CM

## 2020-07-18 NOTE — Telephone Encounter (Signed)
Received call from Briana Barr; patient has a disability parking placard from Briana Barr and wants to transfer it to Briana Barr. Please advise [name] if appt is required at [phone]. [name] will pick up paperwork when it's ready and patient will sign after provider fills in his portion of the application.   Paperwork placed in folder.

## 2020-07-18 NOTE — Telephone Encounter (Signed)
No appointment required.   Placed form with provider for signature.

## 2020-07-19 NOTE — Progress Notes (Signed)
COMMUNITY PALLIATIVE CARE RN NOTE  PATIENT NAME: Briana Barr DOB: [birth year] MRN: [id number]  PRIMARY CARE PROVIDER: [name], MD  RESPONSIBLE PARTY: Briana Barr (daughter in law) Acct ID - Guarantor Home Phone Work Phone Relationship Acct Type  [id number] Briana Barr [phone]  Self P/F     [address], [place], [place] [address]   Covid-19 Pre-screening Negative  PLAN OF CARE and INTERVENTION:  1. ADVANCE CARE PLANNING/GOALS OF CARE: Goal is for patient to remain in her home and avoid hospitalizations. She has a DNR. 2. PATIENT/CAREGIVER EDUCATION: Symptom management, safe mobility/transfers, s/s of infection 3. DISEASE STATUS: Met with patient and her son in the home to follow up on patient's lung status. Upon arrival, patient is sitting up in her recliner watching television. She is intermittently confused and forgetful, but able to answer questions and make her needs known. She denies pain today. Over the weekend, she experienced a severe pain to her right side, underneath her breast that caused her to scream. It continued when her son got her in bed. He gave her a Norco and pain started to subside. She has not complained of this pain since. She continues on oxygen at 6L/min via [place]. She continues to smoke, but is smoking less and she is not asking for a cigarette as often due her increasing forgetfulness. She is taking her Albuterol nebulizer 1-2x/day. She has a semi-productive cough with yellow tinged sputum. For the past 3 days, she has been taking her prescription strength Mucinex 1200 mg daily because of increased throat and upper airway congestion and to make her cough more productive. Her lungs are clear today. Communication sent to Dr. Dennard Schaumann to see if it is ok for patient to take Mucinex OTC 600 mg tablets daily and he agrees that she can if it is helpful. Her appetite remains good. She has an appointment with her Vein specialist tomorrow for her annual ultrasound  to check her circulation. Will continue to monitor.   HISTORY OF PRESENT ILLNESS: This is a 80 yo female with a history of HTN, COPD and DM II. Palliative care teamcontinues to follow patient and will visit patient monthly and PRN.    CODE STATUS: DNR ADVANCED DIRECTIVES: Y MOST FORM: no PPS: 50%   PHYSICAL EXAM:   VITALS: Today's Vitals   07/01/20 1043  BP: 119/63  Pulse: 83  Resp: 19  Temp: 98.2 F (36.8 C)  TempSrc: Temporal  SpO2: 97%  Weight: 152 lb 6.4 oz (69.1 kg)  Height: $Remove'5\' 4"'RZhPjtW$  (1.626 m)  PainSc: 0-No pain    LUNGS: clear to auscultation  CARDIAC: Cor RRR EXTREMITIES: No edema SKIN: Exposed skin is dry and intact; thin/frail skin  NEURO: Alert and oriented to person/place, intermittent confusion, forgetful, HOH, ambulatory w/walker    (Duration of visit and documentation 45 minutes)   Daryl Eastern, RN BSN

## 2020-08-01 ENCOUNTER — Telehealth: Payer: Self-pay | Admitting: Pharmacist

## 2020-08-01 NOTE — Progress Notes (Addendum)
Chronic Care Management Pharmacy Assistant   Name: Briana Barr  MRN: [id number] DOB: 1941-03-05  Reason for Encounter: Medication Review/Medication Coordination Call.    Medications: Outpatient Encounter Medications as of 08/01/2020  Medication Sig   acetaZOLAMIDE (DIAMOX) 250 MG tablet TAKE 1 TABLET BY MOUTH EVERY DAY (Patient taking differently: Take 250 mg by mouth daily.)   albuterol (PROAIR HFA) 108 (90 Base) MCG/ACT inhaler Inhale 2 puffs into the lungs every 6 (six) hours as needed for wheezing or shortness of breath.   aspirin 81 MG tablet Take 81 mg by mouth daily.   Cholecalciferol (VITAMIN D3) 250 MCG (10000 UT) capsule Take 1 capsule (10,000 Units total) by mouth daily.   ELDERBERRY PO Take 1 each by mouth daily.   escitalopram (LEXAPRO) 10 MG tablet TAKE 1 TABLET BY MOUTH EVERY DAY (Patient taking differently: Take 10 mg by mouth daily.)   Fluticasone-Umeclidin-Vilant (TRELEGY ELLIPTA) 100-62.5-25 MCG/INH AEPB Inhale 1 Inhaler into the lungs daily.   HYDROcodone-acetaminophen (NORCO/VICODIN) 5-325 MG tablet Take 1 tablet by mouth 4 (four) times daily.   ipratropium-albuterol (DUONEB) 0.5-2.5 (3) MG/3ML SOLN Take 3 mLs by nebulization every 6 (six) hours as needed. (Patient taking differently: Take 3 mLs by nebulization every 6 (six) hours as needed (SOB, wheezing).)   levofloxacin (LEVAQUIN) 500 MG tablet Take 1 tablet (500 mg total) by mouth daily.   losartan (COZAAR) 50 MG tablet Take 1 tablet (50 mg total) by mouth daily.   metFORMIN (GLUCOPHAGE) 500 MG tablet Take 1 tablet (500 mg total) by mouth 2 (two) times daily with a meal.   Multiple Vitamins-Minerals (CENTRUM SILVER 50+WOMEN) TABS Take 1 tablet by mouth daily.   omeprazole (PRILOSEC) 20 MG capsule TAKE 1 CAPSULE BY MOUTH TWICE A DAY BEFORE A MEAL (Patient taking differently: Take 20 mg by mouth 2 (two) times daily before a meal.)   predniSONE (DELTASONE) 20 MG tablet 3 tabs poqday 1-2, 2 tabs poqday 3-4, 1 tab  poqday 5-6   rosuvastatin (CRESTOR) 20 MG tablet TAKE 1 TABLET BY MOUTH EVERYDAY AT BEDTIME (Patient taking differently: Take 20 mg by mouth at bedtime.)   sitaGLIPtin (JANUVIA) 100 MG tablet Take 1 tablet (100 mg total) by mouth daily.   No facility-administered encounter medications on file as of 08/01/2020.    Reviewed chart for medication changes ahead of medication coordination call.  No OVs, Consults, or hospital visits since last care coordination call.  No medication changes indicated   BP Readings from Last 3 Encounters:  07/01/20 119/63  06/28/20 122/63  06/21/20 138/68    Lab Results  Component Value Date   HGBA1C 8.7 (H) 03/14/2020     Patient obtains medications through Adherence Packaging  30 Days   Last adherence delivery included: Januvia 100 mg one tablet Breakfast  Escitalopram 10 mg one tablet at breakfast  Acetazolamide 250 mg one tablet at breakfast Metformin 500 mg two tablets at breakfast and evening meal Omeprazole 20 mg two tablets at breakfast and evening meal  Losartan Potassium 50 mg one tablet at breakfast  Rosuvastatin 20 mg one tablet evening meal  Trelegy Aer 100 mcg Ellipta 1 puff by mouth into lungs daily Hydroco/apap 5-325 mg PRN, 4 times daily  Patient declined meds last month due: None  Patient is due for next adherence delivery on: 08/08/20  Called patient and reviewed medications and coordinated delivery.  This delivery to include: Januvia 100 mg one tablet Breakfast  Escitalopram 10 mg one tablet at breakfast  Acetazolamide  250 mg one tablet at breakfast Metformin 500 mg two tablets at breakfast and evening meal Omeprazole 20 mg two tablets at breakfast and evening meal  Losartan Potassium 50 mg one tablet at breakfast  Rosuvastatin 20 mg one tablet evening meal  Trelegy Aer 100 mcg Ellipta 1 puff by mouth into lungs daily Hydroco/apap 5-325 mg PRN, 4 times daily  Patient declined the following medications: None  Patient  needs refills for: Hydroco/apap 5-325 mg PRN, 4 times daily- requested.  Confirmed delivery date of 08/08/20, advised patient that pharmacy will contact them the morning of delivery.  Follow-Up:Pharmacist Review  Charlann Lange, RMA Clinical Pharmacist Assistant [phone]  10 minutes spent in review, coordination, and documentation.  Reviewed by: Beverly Milch, PharmD Clinical Pharmacist [facility] [phone]

## 2020-08-05 NOTE — Progress Notes (Signed)
COMMUNITY PALLIATIVE CARE RN NOTE  PATIENT NAME: Jolyssa Rarick DOB: 07-24-1940 MRN: [id number]  PRIMARY CARE PROVIDER: [name], MD  RESPONSIBLE PARTY: [name] (daughter-in-law) Acct ID - Guarantor Home Phone Work Phone Relationship Acct Type  [id number] JOURNEI, THOMASSEN [phone]  Self P/F     [address], [place], [place] [address]   Covid-19 Prescreening Negative  PLAN OF CARE and INTERVENTION:  1. ADVANCE CARE PLANNING/GOALS OF CARE: Goal is for patient to remain in her home and avoid hospitalizations. She has a DNR. 2. PATIENT/CAREGIVER EDUCATION: Symptom management, wound management 3. DISEASE STATUS: Daughter-in-law, [name] requested RN visit to assess a wound on patient's left hand. Upon arrival, patient is sitting up in her recliner awake and alert. Son explained that the house alarm was going off while patient was asleep, and the cat scratched patient on accident to try and wake her up. Family cleansed area with peroxide and covered with a non-stick dressing and wrapped her hand with Kerlex. Removed bandage today. Skin tear is about an inch long. No redness or swelling noted. Area looks clean and dry. Serosanguineous drainage has stopped. Recommended cleaning area daily with warm soap and water, pat dry, applying antibiotic ointment, cover with non-stick telfa and wrap with Kerlex. Family verbalized understanding. I performed dressing change today. Patient states her hand is sore. She does have some slight expiratory wheezing noted in all lung fields from her COPD. No rhonchi or rales noted. She has completed her daily exercises for today per son. She continues with Albuterol nebs 1-2x/day. Patient has some new hearing aids coming in and is looking forward to being able to hear better. Will continue to monitor.  HISTORY OF PRESENT ILLNESS: This is a 80 yo female with a history of HTN, COPD and DM II. Palliative care teamcontinues to follow patient and will visit patient  monthly and PRN.    CODE STATUS: DNR ADVANCED DIRECTIVES: Y MOST FORM: no PPS: 50%   PHYSICAL EXAM:   VITALS: Today's Vitals   07/18/20 1127  BP: 137/68  Pulse: 86  Resp: 18  Temp: 98.4 F (36.9 C)  TempSrc: Temporal  SpO2: 94%  PainSc: 2   PainLoc: Hand    LUNGS: expiratory wheezes bilaterally CARDIAC: Cor RRR EXTREMITIES: No edema SKIN: skin tear to left hand  NEURO: Alert and oriented to person/place, poor short term memory, intermittent confusion, ambulatory w/walker    (Duration of visit and documentation 45 minutes)   Daryl Eastern, RN BSN

## 2020-08-06 ENCOUNTER — Other Ambulatory Visit: Payer: Self-pay | Admitting: Family Medicine

## 2020-08-06 ENCOUNTER — Other Ambulatory Visit: Payer: Self-pay | Admitting: *Deleted

## 2020-08-06 MED ORDER — HYDROCODONE-ACETAMINOPHEN 5-325 MG PO TABS: 1.0000 | ORAL_TABLET | Freq: Four times a day (QID) | ORAL | 0 refills | Status: DC

## 2020-08-06 NOTE — Telephone Encounter (Signed)
-----   Message from Rosetta Posner sent at 08/06/2020 11:50 AM EDT ----- Regarding: Medication Refill Good morning can you send a refill for Hydroco/apap 5-325 mgPRN, 4 times daily to [facility]. Thank you.

## 2020-08-06 NOTE — Telephone Encounter (Signed)
Ok to refill??  Last office visit 04/12/2020.  Last refill 07/02/2020.

## 2020-08-22 ENCOUNTER — Telehealth: Payer: Self-pay | Admitting: *Deleted

## 2020-08-22 NOTE — Telephone Encounter (Signed)
Called and left a voicemail with patient's daughter in law, [name], to schedule a palliative care home visit. Left contact information for a return call.

## 2020-08-26 ENCOUNTER — Other Ambulatory Visit: Payer: Self-pay | Admitting: Family Medicine

## 2020-08-28 ENCOUNTER — Other Ambulatory Visit: Payer: Self-pay | Admitting: *Deleted

## 2020-08-28 ENCOUNTER — Telehealth: Payer: Self-pay | Admitting: Pharmacist

## 2020-08-28 NOTE — Telephone Encounter (Signed)
Ok to refill??  Last office visit 03/25/2020.  Last refill 08/06/2020.

## 2020-08-28 NOTE — Telephone Encounter (Signed)
-----   Message from Rosetta Posner sent at 08/28/2020  2:11 PM EDT ----- Regarding: Medication Refill Hi, can you send the patient hydrocodone to [facility]. Thanks.

## 2020-08-28 NOTE — Progress Notes (Addendum)
Chronic Care Management Pharmacy Assistant   Name: Shantal Dezarn  MRN: [id number] DOB: 17-Jan-1941  Reason for Encounter: Medication Review/Medication Coordination Call.   Outpatient Encounter Medications as of 08/28/2020  Medication Sig   acetaZOLAMIDE (DIAMOX) 250 MG tablet TAKE ONE TABLET BY MOUTH EVERY MORNING   albuterol (PROAIR HFA) 108 (90 Base) MCG/ACT inhaler Inhale 2 puffs into the lungs every 6 (six) hours as needed for wheezing or shortness of breath.   aspirin 81 MG tablet Take 81 mg by mouth daily.   Cholecalciferol (VITAMIN D3) 250 MCG (10000 UT) capsule Take 1 capsule (10,000 Units total) by mouth daily.   ELDERBERRY PO Take 1 each by mouth daily.   escitalopram (LEXAPRO) 10 MG tablet TAKE 1 TABLET BY MOUTH EVERY DAY (Patient taking differently: Take 10 mg by mouth daily.)   Fluticasone-Umeclidin-Vilant (TRELEGY ELLIPTA) 100-62.5-25 MCG/INH AEPB Inhale 1 Inhaler into the lungs daily.   HYDROcodone-acetaminophen (NORCO/VICODIN) 5-325 MG tablet Take 1 tablet by mouth 4 (four) times daily.   ipratropium-albuterol (DUONEB) 0.5-2.5 (3) MG/3ML SOLN Take 3 mLs by nebulization every 6 (six) hours as needed. (Patient taking differently: Take 3 mLs by nebulization every 6 (six) hours as needed (SOB, wheezing).)   levofloxacin (LEVAQUIN) 500 MG tablet Take 1 tablet (500 mg total) by mouth daily.   losartan (COZAAR) 50 MG tablet Take 1 tablet (50 mg total) by mouth daily.   metFORMIN (GLUCOPHAGE) 500 MG tablet TAKE ONE TABLET BY MOUTH TWICE DAILY WITH FOOD   Multiple Vitamins-Minerals (CENTRUM SILVER 50+WOMEN) TABS Take 1 tablet by mouth daily.   omeprazole (PRILOSEC) 20 MG capsule TAKE 1 CAPSULE BY MOUTH TWICE A DAY BEFORE A MEAL (Patient taking differently: Take 20 mg by mouth 2 (two) times daily before a meal.)   predniSONE (DELTASONE) 20 MG tablet 3 tabs poqday 1-2, 2 tabs poqday 3-4, 1 tab poqday 5-6   rosuvastatin (CRESTOR) 20 MG tablet TAKE 1 TABLET BY MOUTH EVERYDAY AT BEDTIME  (Patient taking differently: Take 20 mg by mouth at bedtime.)   sitaGLIPtin (JANUVIA) 100 MG tablet Take 1 tablet (100 mg total) by mouth daily.   No facility-administered encounter medications on file as of 08/28/2020.   Reviewed chart for medication changes ahead of medication coordination call.  No OVs, Consults, or hospital visits since last care coordination call.  No medication changes indicated.  BP Readings from Last 3 Encounters:  07/18/20 137/68  07/01/20 119/63  06/28/20 122/63    Lab Results  Component Value Date   HGBA1C 8.7 (H) 03/14/2020     Patient obtains medications through Adherence Packaging  30 Days   Last adherence delivery included:  Januvia 100 mg one tablet Breakfast  Escitalopram 10 mg one tablet at breakfast  Acetazolamide 250 mg one tablet at breakfast Metformin 500 mg two tablets at breakfast and evening meal Omeprazole 20 mg two tablets at breakfast and evening meal  Losartan Potassium 50 mg one tablet at breakfast  Rosuvastatin 20 mg one tablet evening meal  Trelegy Aer 100 mcg Ellipta 1 puff by mouth into lungs daily Hydroco/apap 5-325 mg PRN, 4 times daily  Patient declined meds last month: None  Patient is due for next adherence delivery on: 09/06/20.  Called patient and reviewed medications and coordinated delivery.  This delivery to include: Januvia 100 mg one tablet Breakfast  Escitalopram 10 mg one tablet at breakfast  Acetazolamide 250 mg one tablet at breakfast Metformin 500 mg one tablet at breakfast and one tablet evening meal Omeprazole  20 mg one tablet at breakfast and one tablet evening meal  Losartan Potassium 50 mg one tablet at breakfast  Rosuvastatin 20 mg one tablet evening meal  Trelegy Aer 100 mcg Ellipta 1 puff by mouth into lungs daily Hydroco/apap 5-325 mg PRN, 4 times daily  Patient declined the following medications: None  Patient needs refills for Hydroco/apap 5-325 mg PRN, 4 times daily.  Confirmed  delivery date of 09/06/20, advised patient that pharmacy will contact them the morning of delivery.  Follow-Up:Pharmacist Review  Charlann Lange, [place] Pharmacist Assistant [phone]

## 2020-08-29 MED ORDER — HYDROCODONE-ACETAMINOPHEN 5-325 MG PO TABS: 1.0000 | ORAL_TABLET | Freq: Four times a day (QID) | ORAL | 0 refills | Status: DC

## 2020-09-02 ENCOUNTER — Other Ambulatory Visit: Payer: Medicare Other | Admitting: *Deleted

## 2020-09-02 ENCOUNTER — Other Ambulatory Visit: Payer: Self-pay

## 2020-09-02 VITALS — BP 124/63 | HR 82 | Temp 97.9°F | Resp 18 | Ht 64.0 in | Wt 152.4 lb

## 2020-09-02 DIAGNOSIS — Z515 Encounter for palliative care: Secondary | ICD-10-CM

## 2020-09-02 NOTE — Progress Notes (Signed)
COMMUNITY PALLIATIVE CARE RN NOTE  PATIENT NAME: Briana Barr DOB: [birth year] MRN: [id number]  PRIMARY CARE PROVIDER: [name], MD  RESPONSIBLE PARTY: [name] (daughter-in-law) Acct ID - Guarantor Home Phone Work Phone Relationship Acct Type  [id number] [facility] [phone]  Self P/F     [place], [place], [place] [address]   [phone] Pre-screening Negative  PLAN OF CARE and INTERVENTION:  1. ADVANCE CARE PLANNING/GOALS OF CARE: Goal is for patient to remain in her home and avoid hospitalizations. She has a DNR. 2. PATIENT/CAREGIVER EDUCATION: Symptom management, safe mobility/transfers, s/s of infection 3. DISEASE STATUS: Face-to-face visit completed. Met with patient, daughter-in-law and son in patient's home. She is sitting up in her recliner awake and alert. She has a poor short term memory and is forgetful, but able to answer questions and make her needs known. She recently received some hearing aids, which family says seems to have made her "sharper" and more aware of what goes on around her. Son says that she c/o pain in her right rib area under her breast this morning. Initially, she c/o towards beginning of visit, but denied pain at the end of visit. Pain comes and goes, but is mild. She is oxygen dependent and wears 6L/min via . No shortness of breath or coughing noted. She does have a slight expiratory wheeze in her right lung fields. She continues to take an Albuterol nebulizer treatment daily in the mornings. She is smoking less. She is ambulatory using a walker. She requires some assistance with bathing and dressing. She is able to toilet and feed herself independently. She performs a series of exercises every morning, except on Sundays. Her appetite is good. She is eating 2-3 meals/day depending on how late she sleeps into the morning. Her blood sugars have been over 200 for a little over a month. It is mainly in the 200s, but has had an occasional reading  in the 300s. [name], is going to reach out to her primary physician to make him aware in case he would like to make some medication adjustments. She is currently taking Januvia and Metformin. Her diet has not changed. She continues to take naps during the day and is sleeping well throughout the night. Family denies any issues with bowel movements. She is taking a stool softener daily. She recently went to the Dentist for a deep cleaning and has 2 small cavities in the front upper part of her mouth. She is to have these filled. Overall, family feels that patient's condition is managed with current medication regimen. The skin tear to her left hand has healed. Will continue to monitor.   HISTORY OF PRESENT ILLNESS: This is a 80 yo female with a history of HTN, COPD and DM II. Palliative care teamcontinues to follow patient and will visit patient monthly and PRN.    CODE STATUS: DNR ADVANCED DIRECTIVES: Y MOST FORM: no PPS: 50%   PHYSICAL EXAM:   VITALS: Today's Vitals   09/02/20 1043  BP: 124/63  Pulse: 82  Resp: 18  Temp: 97.9 F (36.6 C)  TempSrc: Temporal  SpO2: 95%  Weight: 152 lb 6.4 oz (69.1 kg)  Height: $Remove'5\' 4"'QZmXLfd$  (1.626 m)  PainSc: 2   PainLoc: Rib Cage    LUNGS: slight expiratory wheeze in right lung fields, no coughing, no sob CARDIAC: Cor RRR EXTREMITIES: No edema SKIN: Left hand skin tear has healed  NEURO: Alert and oriented to person/place, forgetful, now wears hearing aids, ambulatory w/walker   (  Duration of visit and documentation 60 minutes)   Daryl Eastern, RN BSN

## 2020-09-04 ENCOUNTER — Telehealth: Payer: Self-pay | Admitting: Family Medicine

## 2020-09-04 ENCOUNTER — Other Ambulatory Visit: Payer: Self-pay | Admitting: Family Medicine

## 2020-09-04 NOTE — Telephone Encounter (Signed)
Patient's son Skipper Cliche dropped off application for parking placard; provider forgot to sign it. Placed in hanging folder at desk of [place] [facility]. Please call Ronalee Belts at [phone] when form ready for pickup.

## 2020-09-04 NOTE — Telephone Encounter (Signed)
Routed to provider for signature.

## 2020-09-04 NOTE — Progress Notes (Deleted)
Chronic Care Management Pharmacy Note  09/04/2020 Name:  Briana Barr MRN:  [id number] DOB:  [birth year]  Subjective: Briana Barr is an 80 y.o. year old female who is a primary patient of Pickard, Cammie Mcgee, MD.  The CCM team was consulted for assistance with disease management and care coordination needs.    Engaged with patient by telephone for follow up visit in response to provider referral for pharmacy case management and/or care coordination services.   Consent to Services:  The patient was given the following information about Chronic Care Management services today, agreed to services, and gave verbal consent: 1. CCM service includes personalized support from designated clinical staff supervised by the primary care provider, including individualized plan of care and coordination with other care providers 2. 24/7 contact phone numbers for assistance for urgent and routine care needs. 3. Service will only be billed when office clinical staff spend 20 minutes or more in a month to coordinate care. 4. Only one practitioner may furnish and bill the service in a calendar month. 5.The patient may stop CCM services at any time (effective at the end of the month) by phone call to the office staff. 6. The patient will be responsible for cost sharing (co-pay) of up to 20% of the service fee (after annual deductible is met). Patient agreed to services and consent obtained.  Patient Care Team: [name], MD as PCP - General (Family Medicine) Edythe Clarity, [facility] as Pharmacist (Pharmacist)  Recent office visits: None since last CCM call  Recent consult visits: None since last CCM call  Hospital visits: None in previous 6 months   Objective:  Lab Results  Component Value Date   CREATININE 0.51 03/16/2020   BUN 17 03/16/2020   GFRNONAA >60 03/16/2020   GFRAA 101 12/01/2019   NA 137 03/16/2020   K 3.9 03/16/2020   CALCIUM 8.9 03/16/2020   CO2 25 03/16/2020   GLUCOSE 206 (H)  03/16/2020    Lab Results  Component Value Date/Time   HGBA1C 8.7 (H) 03/14/2020 10:56 AM   HGBA1C 7.0 (H) 12/01/2019 12:41 PM   MICROALBUR 1.7 01/12/2019 10:17 AM   MICROALBUR 1.2 12/07/2017 09:39 AM    Last diabetic Eye exam:  Lab Results  Component Value Date/Time   HMDIABEYEEXA No Retinopathy 12/30/2017 12:00 AM    Last diabetic Foot exam: No results found for: HMDIABFOOTEX   Lab Results  Component Value Date   CHOL 292 (H) 01/12/2019   HDL 53 01/12/2019   LDLCALC 205 (H) 01/12/2019   TRIG 168 (H) 01/12/2019   CHOLHDL 5.5 (H) 01/12/2019    Hepatic Function Latest Ref Rng & Units 03/16/2020 03/15/2020 03/14/2020  Total Protein 6.5 - 8.1 g/dL 7.4 6.8 7.0  Albumin 3.5 - 5.0 g/dL 3.7 3.3(L) 3.6  AST 15 - 41 U/L $Remo'26 23 17  'SNpoW$ ALT 0 - 44 U/L 34 26 19  Alk Phosphatase 38 - 126 U/L 63 50 55  Total Bilirubin 0.3 - 1.2 mg/dL 0.5 0.6 0.4    Lab Results  Component Value Date/Time   TSH 0.286 (L) 11/13/2019 04:54 AM   TSH 1.54 08/07/2016 09:22 AM   FREET4 0.89 11/13/2019 04:54 AM    CBC Latest Ref Rng & Units 03/16/2020 03/15/2020 03/14/2020  WBC 4.0 - 10.5 K/uL 10.7(H) 11.7(H) 14.8(H)  Hemoglobin 12.0 - 15.0 g/dL 11.6(L) 11.7(L) 11.0(L)  Hematocrit 36.0 - 46.0 % 38.5 38.5 36.4  Platelets 150 - 400 K/uL 464(H) 405(H) 390    No  results found for: VD25OH  Clinical ASCVD: {YES/NO:21197} The 10-year ASCVD risk score Mikey Bussing DC Jr., et al., 2013) is: 55.9%   Values used to calculate the score:     Age: 76 years     Sex: Female     Is Non-Hispanic African American: No     Diabetic: Yes     Tobacco smoker: Yes     Systolic Blood Pressure: 062 mmHg     Is BP treated: Yes     HDL Cholesterol: 53 mg/dL     Total Cholesterol: 292 mg/dL    Depression screen [facility] 2/9 01/12/2019 12/07/2017 12/07/2017  Decreased Interest 2 0 0  Down, Depressed, Hopeless 2 0 0  PHQ - 2 Score 4 0 0  Altered sleeping 2 - -  Tired, decreased energy 3 - -  Change in appetite 2 - -  Feeling bad or failure  about yourself  1 - -  Trouble concentrating 2 - -  Moving slowly or fidgety/restless 0 - -  Suicidal thoughts 0 - -  PHQ-9 Score 14 - -  Difficult doing work/chores Somewhat difficult - -     ***Other: (CHADS2VASc if Afib, MMRC or CAT for COPD, ACT, DEXA)  Social History   Tobacco Use  Smoking Status Current Every Day Smoker  . Packs/day: 1.00  . Types: Cigarettes  Smokeless Tobacco Never Used  Tobacco Comment   started age 25   BP Readings from Last 3 Encounters:  09/02/20 124/63  07/18/20 137/68  07/01/20 119/63   Pulse Readings from Last 3 Encounters:  09/02/20 82  07/18/20 86  07/01/20 83   Wt Readings from Last 3 Encounters:  09/02/20 152 lb 6.4 oz (69.1 kg)  07/01/20 152 lb 6.4 oz (69.1 kg)  06/28/20 157 lb (71.2 kg)   BMI Readings from Last 3 Encounters:  09/02/20 26.16 kg/m  07/01/20 26.16 kg/m  06/28/20 26.95 kg/m    Assessment/Interventions: Review of patient past medical history, allergies, medications, health status, including review of consultants reports, laboratory and other test data, was performed as part of comprehensive evaluation and provision of chronic care management services.   SDOH:  (Social Determinants of Health) assessments and interventions performed: {yes/no:20286}  SDOH Screenings   Alcohol Screen: Not on file  Depression (IRS8-5): Not on file  Financial Resource Strain: Low Risk   . Difficulty of Paying Living Expenses: Not very hard  Food Insecurity: Not on file  Housing: Not on file  Physical Activity: Not on file  Social Connections: Not on file  Stress: Not on file  Tobacco Use: High Risk  . Smoking Tobacco Use: Current Every Day Smoker  . Smokeless Tobacco Use: Never Used  Transportation Needs: Not on file    CCM Care Plan  Allergies  Allergen Reactions  . Vioxx [Rofecoxib] Swelling    Medications Reviewed Today    Reviewed by Edythe Clarity, [facility] (Pharmacist) on 05/07/20 at 1125  Med List Status: <None>   Medication Order Taking? Sig Documenting Provider Last Dose Status Informant  acetaZOLAMIDE (DIAMOX) 250 MG tablet [id number] Yes TAKE 1 TABLET BY MOUTH EVERY DAY  Patient taking differently: Take 250 mg by mouth daily.   [name], MD Taking Active   albuterol [facility] HFA) [phone] Base) MCG/ACT inhaler [id number] Yes Inhale 2 puffs into the lungs every 6 (six) hours as needed for wheezing or shortness of breath. Alycia Rossetti, MD Taking Active Family Member  aspirin 81 MG tablet [id number] Yes Take 81  mg by mouth daily. [provider] Taking Active Family Member  Cholecalciferol (VITAMIN D3) 250 MCG (10000 UT) capsule [id number] Yes Take 1 capsule (10,000 Units total) by mouth daily. [name], MD Taking Active Family Member  ELDERBERRY PO [id number] Yes Take 1 each by mouth daily. [provider] Taking Active Family Member  escitalopram (LEXAPRO) 10 MG tablet [id number] Yes TAKE 1 TABLET BY MOUTH EVERY DAY  Patient taking differently: Take 10 mg by mouth daily.   [name], MD Taking Active   Fluticasone-Umeclidin-Vilant [facility] ELLIPTA) 100-62.5-25 MCG/INH AEPB [id number] Yes Inhale 1 Inhaler into the lungs daily. [name], MD Taking Active Family Member  HYDROcodone-acetaminophen (NORCO/VICODIN) 5-325 MG tablet [id number] Yes Take 1 tablet by mouth 4 (four) times daily. [name], MD Taking Active   ipratropium-albuterol (DUONEB) 0.5-2.5 (3) MG/3ML SOLN [id number] Yes Take 3 mLs by nebulization every 6 (six) hours as needed.  Patient taking differently: Take 3 mLs by nebulization every 6 (six) hours as needed (SOB, wheezing).   [name], MD Taking Active   losartan (COZAAR) 50 MG tablet [id number] Yes Take 1 tablet (50 mg total) by mouth daily. [name], MD Taking Active Family Member  metFORMIN (GLUCOPHAGE) 500 MG tablet [id number] Yes Take 1 tablet (500 mg total) by mouth 2 (two) times daily with a meal. [name], MD Taking Active Family Member  Multiple Vitamins-Minerals (CENTRUM SILVER 50+WOMEN) TABS [id number] Yes Take 1 tablet by mouth daily. [name], MD Taking Active Family Member  omeprazole (PRILOSEC) 20 MG capsule [id number] Yes TAKE 1 CAPSULE BY MOUTH TWICE A DAY BEFORE A MEAL  Patient taking differently: Take 20 mg by mouth 2 (two) times daily before a meal.   [name], MD Taking Active   rosuvastatin (CRESTOR) 20 MG tablet [id number] Yes TAKE 1 TABLET BY MOUTH EVERYDAY AT BEDTIME  Patient taking differently: Take 20 mg by mouth at bedtime.   [name], MD Taking Active   sitaGLIPtin (JANUVIA) 100 MG tablet [id number] Yes Take 1 tablet (100 mg total) by mouth daily. [name], MD Taking Active   Med List Note Victoriano Lain, CPhT 03/14/20 0021): Daughter in law Mohammed Kindle can help with meds          Patient Active Problem List   Diagnosis Date Noted  . CAP (community acquired pneumonia) 03/14/2020  . Bacteremia 11/14/2019  . Acute respiratory failure with hypoxia ([place]) 11/13/2019  . COPD (chronic obstructive pulmonary disease) ([facility] [place]) 11/13/2019  . Diabetes mellitus type 2 in nonobese ([place]) 11/13/2019  . Essential hypertension 11/13/2019  . Community acquired pneumonia of left lower lobe of lung   . Abnormal CT scan, colon   . Benign neoplasm of sigmoid colon   . Benign neoplasm of rectum   . Loss of weight   . Epigastric abdominal tenderness without rebound tenderness   . Anorexia     Immunization History  Administered Date(s) Administered  . Fluad Quad(high Dose 65+) 01/12/2019  . Influenza,inj,Quad PF,6+ Mos 03/02/2016, 12/07/2017  . Pneumococcal Conjugate-13 04/13/2014  . Pneumococcal Polysaccharide-23 12/07/2017    Conditions to be addressed/monitored:  {USCCMDZASSESSMENTOPTIONS:23563}  There are no care plans that you recently modified to display for this patient.    Medication Assistance:  {MEDASSISTANCEINFO:25044}  Compliance/Adherence/Medication fill history: Care Gaps: Needs diabetic foot exam - in palliative care at the moment  Star-Rating Drugs: [facility] delivery on 09/06/20 for: Losartan, Rosuvastatin, Metformin for 30 days supply.  Patient's  preferred pharmacy is:  [id number] - [place], [place] - [address] [place] [address] Phone: [phone] Fax: [phone]  [facility] - [place], [place] - [address] [place] [address] Phone: [phone] Fax: [phone]  Uses pill box? {Yes or If no, why not?:20788} Pt endorses ***% compliance  We discussed: {Pharmacy options:24294} Patient decided to: {US Pharmacy Plan:23885}  Care Plan and Follow Up Patient Decision:  {FOLLOWUP:24991}  Plan: {CM FOLLOW UP KHTX:77414}  *** Current Barriers:  . {pharmacybarriers:24917}  Pharmacist Clinical Goal(s):  Marland Kitchen Patient will {PHARMACYGOALCHOICES:24921} through collaboration with PharmD and provider.   Interventions: . 1:1 collaboration with [name], MD regarding development and update of comprehensive plan of care as evidenced by provider attestation and co-signature . Inter-disciplinary care team collaboration (see longitudinal plan of care) . Comprehensive medication review performed; medication list updated in electronic medical record  Hypertension (BP goal {CHL HP [facility] Pharmacist BP ranges:[phone]}) -{US controlled/uncontrolled:25276} -Current treatment: . Losartan $RemoveBe'50mg'hoSxxjlYs$  daily -Medications previously tried: ***  -Current home readings: *** -Current dietary habits: *** -Current exercise habits: *** -{ACTIONS;DENIES/REPORTS:21021675::"Denies"} hypotensive/hypertensive symptoms -Educated on {CCM BP Counseling:25124} -Counseled to monitor BP at home ***, document, and provide log at future  appointments -{CCMPHARMDINTERVENTION:25122}  Diabetes (A1c goal {A1c goals:23924}) -{US controlled/uncontrolled:25276} -Current medications: . Januvia $RemoveB'100mg'vXncgVay$  daily . Metformin $RemoveBef'500mg'fcyQPWQlgR$  twice daily with food -Medications previously tried: ***  -Current home glucose readings . fasting glucose: *** . post prandial glucose: *** -{ACTIONS;DENIES/REPORTS:21021675::"Denies"} hypoglycemic/hyperglycemic symptoms -Current meal patterns:  . breakfast: ***  . lunch: ***  . dinner: *** . snacks: *** . drinks: *** -Current exercise: *** -Educated on {CCM DM COUNSELING:25123} -Counseled to check feet daily and get yearly eye exams -{CCMPHARMDINTERVENTION:25122}  COPD (Goal: control symptoms and prevent exacerbations) -{US controlled/uncontrolled:25276} -Current treatment  . Trelegy Ellipta 100-62.5-25 . Albuterol HFA 18mcg prn -Medications previously tried: ***  -Gold Grade: {CHL HP [facility] COPD Gold ELTRV:[id number]} -Current COPD Classification:  {CHL HP [facility] COPD Classification:[phone]} -MMRC/CAT score: *** -Pulmonary function testing: *** -Exacerbations requiring treatment in last 6 months: *** -Patient {Actions; denies-reports:120008} consistent use of maintenance inhaler -Frequency of rescue inhaler use: *** -Counseled on {CCMINHALERCOUNSELING:25121} -{CCMPHARMDINTERVENTION:25122}   Patient Goals/Self-Care Activities . Patient will:  - {pharmacypatientgoals:24919}  Follow Up Plan: {CM FOLLOW UP SHUO:37290}

## 2020-09-05 ENCOUNTER — Other Ambulatory Visit: Payer: Self-pay | Admitting: Family Medicine

## 2020-09-05 ENCOUNTER — Telehealth: Payer: Self-pay

## 2020-09-05 ENCOUNTER — Other Ambulatory Visit: Payer: Self-pay | Admitting: *Deleted

## 2020-09-05 ENCOUNTER — Other Ambulatory Visit: Payer: Self-pay

## 2020-09-05 MED ORDER — HYDROCODONE-ACETAMINOPHEN 5-325 MG PO TABS: 1.0000 | ORAL_TABLET | Freq: Four times a day (QID) | ORAL | 0 refills | Status: DC | PRN

## 2020-09-05 NOTE — Telephone Encounter (Signed)
Please resend to correct pharmacy.

## 2020-09-05 NOTE — Telephone Encounter (Signed)
-----   Message from Rosetta Posner sent at 09/05/2020  4:06 PM EDT ----- Regarding: Medicaton Refill Hi the patients Hydrocodone went to the wrong pharmacy can you please call them and cancel the rx and re send it to [facility]. Thank you.

## 2020-09-05 NOTE — Telephone Encounter (Signed)
PCP unable to send prescription through to [facility]. States that error code reads pharmacy is unable to accept controlled substance E-Scripts at this time. [facility] Patient may pick up prescription at retail pharmacy, or she can pick up printed prescription to take to pharmacy of choice.   Call placed to patient and spoke with daughter in law. Patient daughter in law very upset that [facility] without [facility]. Advised of issues and given PCP recommendations. Daughter in law very agitated and hung up phone on Probation officer.

## 2020-09-05 NOTE — Telephone Encounter (Signed)
Please deny as duplicate request.   Last refill 08/29/2020.

## 2020-09-05 NOTE — Telephone Encounter (Signed)
I have called and spoke with Briana Barr and informed him it was ready for pick up. He will be by sometime today to pick up.

## 2020-09-05 NOTE — Progress Notes (Signed)
[facility] does not accept electronic prescriptions for controlled substances.

## 2020-09-06 ENCOUNTER — Other Ambulatory Visit: Payer: Self-pay | Admitting: Family Medicine

## 2020-09-09 ENCOUNTER — Other Ambulatory Visit: Payer: Self-pay | Admitting: Family Medicine

## 2020-09-10 ENCOUNTER — Other Ambulatory Visit: Payer: Self-pay | Admitting: Family Medicine

## 2020-09-10 MED ORDER — HYDROCODONE-ACETAMINOPHEN 5-325 MG PO TABS: 1.0000 | ORAL_TABLET | Freq: Four times a day (QID) | ORAL | 0 refills | Status: DC | PRN

## 2020-09-10 NOTE — Telephone Encounter (Signed)
Call placed to patient son, Ronalee Belts and he was made aware.   States that [facility] in hopes that [facility] will be able to accept E-Script next month.

## 2020-09-10 NOTE — Telephone Encounter (Signed)
PCP unable to e-scribe through to [facility].   Family has been made aware last week of options: Fill at retail pharmacy that prescription has been sent to, or Stop by office for hard script to take to pharmacy of choice.

## 2020-09-10 NOTE — Telephone Encounter (Signed)
Already refilled

## 2020-09-10 NOTE — Telephone Encounter (Deleted)
-----   Message from [place] sent at 09/10/2020  9:23 AM EDT ----- Regarding: FW: Medicaton Refill Good morning this still has not been resolved and the family is upset because she is going without her medication, [facility] thank you so much. ----- Message ----- From: Rosetta Posner Sent: 09/05/2020   4:08 PM EDT To: Eden Lathe Ramia Sidney, LPN Subject: Medicaton Refill                               Hi the patients Hydrocodone went to the wrong pharmacy can you please call them and cancel the rx and re send it to [facility]. Thank you.

## 2020-09-10 NOTE — Telephone Encounter (Signed)
[place], Trevor Mace  Drenda Sobecki H, LPN Good morning this still has not been resolved and the family is upset because she is going without her medication, please cancel rx [facility] and resend to [facility] thank you so much.        Previous Messages   ----- Message -----  From: Rosetta Posner  Sent: 09/05/2020  4:08 PM EDT  To: Eden Lathe Tarrell Debes, LPN  Subject: Medicaton Refill                 Hi the patients Hydrocodone went to the wrong pharmacy can you please call them and cancel the rx and re send it to [facility]. Thank you.

## 2020-09-10 NOTE — Telephone Encounter (Signed)
The pharmacy has been unable to accept e-scripts from our office for controlled substances. This has been attempted to be sent several times.   The patient daughter was notified of the issue. She was incredibly upset by this. I advised that there is a prescription at the front desk that can be picked up and delivered to [facility] as they have not been able to accept e-scripts, or the prescription could be picked up at a retail pharmacy. Again, this was rather upsetting for the daughter.   We have no other options at this time. She can pick up the prescription at the local pharmacy, or she can pick up the prescription here and deliver it to the appropriate pharmacy.  Thank you.

## 2020-09-10 NOTE — Telephone Encounter (Signed)
Received call from [facility] with [facility].   Reports that prescription should be able to be sent to [facility] now and requested refill.   Please attempt to re-send.

## 2020-09-13 ENCOUNTER — Other Ambulatory Visit: Payer: Self-pay | Admitting: Family Medicine

## 2020-09-13 MED ORDER — HYDROCODONE-ACETAMINOPHEN 5-325 MG PO TABS: 1.0000 | ORAL_TABLET | Freq: Four times a day (QID) | ORAL | 0 refills | Status: DC | PRN

## 2020-09-18 ENCOUNTER — Telehealth: Payer: Self-pay | Admitting: Family Medicine

## 2020-09-18 NOTE — Telephone Encounter (Signed)
Copied from [place] [id number]. Topic: Medicare AWV >> Sep 18, 2020  1:50 PM Cher Nakai R wrote: Reason for CRM: Left message for patient to call back and schedule Medicare Annual Wellness Visit (AWV) in office.   If not able to come in office, please offer to do virtually or by telephone.   Last AWV: 01/12/2019  Please schedule at anytime with [facility]-Nurse Health Advisor.  If any questions, please contact me at [phone]

## 2020-09-26 ENCOUNTER — Telehealth: Payer: Self-pay | Admitting: *Deleted

## 2020-09-26 NOTE — Telephone Encounter (Signed)
Called and spoke with patient's daughter-in-law [name] to schedule a palliative care home visit. Visit scheduled for 6/21@11a .

## 2020-09-27 ENCOUNTER — Other Ambulatory Visit: Payer: Self-pay | Admitting: Family Medicine

## 2020-09-30 ENCOUNTER — Telehealth: Payer: Self-pay | Admitting: Pharmacist

## 2020-09-30 NOTE — Progress Notes (Addendum)
Chronic Care Management Pharmacy Assistant   Name: Briana Barr  MRN: [id number] DOB: 03-Mar-1941  Reason for Encounter: Medication Review/Medication Coordination Call  Medications: Outpatient Encounter Medications as of 09/30/2020  Medication Sig   acetaZOLAMIDE (DIAMOX) 250 MG tablet TAKE ONE TABLET BY MOUTH EVERY MORNING   albuterol (PROAIR HFA) 108 (90 Base) MCG/ACT inhaler Inhale 2 puffs into the lungs every 6 (six) hours as needed for wheezing or shortness of breath.   aspirin 81 MG tablet Take 81 mg by mouth daily.   Cholecalciferol (VITAMIN D3) 250 MCG (10000 UT) capsule Take 1 capsule (10,000 Units total) by mouth daily.   ELDERBERRY PO Take 1 each by mouth daily.   escitalopram (LEXAPRO) 10 MG tablet TAKE 1 TABLET BY MOUTH EVERY DAY (Patient taking differently: Take 10 mg by mouth daily.)   Fluticasone-Umeclidin-Vilant (TRELEGY ELLIPTA) 100-62.5-25 MCG/INH AEPB Inhale 1 Inhaler into the lungs daily.   HYDROcodone-acetaminophen (NORCO/VICODIN) 5-325 MG tablet Take 1 tablet by mouth 4 (four) times daily as needed for severe pain.   ipratropium-albuterol (DUONEB) 0.5-2.5 (3) MG/3ML SOLN Take 3 mLs by nebulization every 6 (six) hours as needed. (Patient taking differently: Take 3 mLs by nebulization every 6 (six) hours as needed (SOB, wheezing).)   levofloxacin (LEVAQUIN) 500 MG tablet Take 1 tablet (500 mg total) by mouth daily.   losartan (COZAAR) 50 MG tablet Take 1 tablet (50 mg total) by mouth daily.   metFORMIN (GLUCOPHAGE) 500 MG tablet TAKE ONE TABLET BY MOUTH TWICE DAILY WITH FOOD   Multiple Vitamins-Minerals (CENTRUM SILVER 50+WOMEN) TABS Take 1 tablet by mouth daily.   omeprazole (PRILOSEC) 20 MG capsule TAKE 1 CAPSULE BY MOUTH TWICE A DAY BEFORE A MEAL (Patient taking differently: Take 20 mg by mouth 2 (two) times daily before a meal.)   predniSONE (DELTASONE) 20 MG tablet 3 tabs poqday 1-2, 2 tabs poqday 3-4, 1 tab poqday 5-6   rosuvastatin (CRESTOR) 20 MG tablet TAKE  ONE TABLET BY MOUTH EVERY EVENING   sitaGLIPtin (JANUVIA) 100 MG tablet Take 1 tablet (100 mg total) by mouth daily.   No facility-administered encounter medications on file as of 09/30/2020.    Reviewed chart for medication changes ahead of medication coordination call.  No OVs, Consults, or hospital visits since last care coordination call.  No medication changes indicated.  BP Readings from Last 3 Encounters:  09/02/20 124/63  07/18/20 137/68  07/01/20 119/63    Lab Results  Component Value Date   HGBA1C 8.7 (H) 03/14/2020     Patient obtains medications through Adherence Packaging  30 Days   Last adherence delivery included: (30 DS 09/06/20) Januvia 100 mg one tablet Breakfast  Escitalopram 10 mg one tablet at breakfast Acetazolamide 250 mg one tablet at breakfast Metformin 500 mg one tablet at breakfast and one tablet evening meal Omeprazole 20 mg one tablet at breakfast and one tablet evening meal Losartan Potassium 50 mg one tablet at breakfast Rosuvastatin 20 mg one tablet evening meal Trelegy Aer 100 mcg Ellipta 1 puff by mouth into lungs daily Hydroco/apap 5-325 mg PRN, 4 times daily  Patient declined meds last month: None  Patient is due for next adherence delivery on: 10/09/20.  Called patient and reviewed medications and coordinated delivery.  This delivery to include: Januvia 100 mg one tablet Breakfast  Escitalopram 10 mg one tablet at breakfast Acetazolamide 250 mg one tablet at breakfast Metformin 500 mg one tablet at breakfast and one tablet evening meal Omeprazole 20 mg one tablet  at breakfast and one tablet evening meal Losartan Potassium 50 mg one tablet at breakfast Rosuvastatin 20 mg one tablet evening meal Trelegy Aer 100 mcg Ellipta 1 puff by mouth into lungs daily Hydroco/apap 5-325 mg PRN, 4 times daily  Patient declined the following medications: None  Patient does not need refills at this time.  Confirmed delivery date of 10/09/20,  advised patient that pharmacy will contact them the morning of delivery.  Follow-Up:Pharmacist Review   Charlann Lange, RMA Clinical Pharmacist Assistant [phone]  10 minutes spent in review, coordination, and documentation.  Reviewed by: Beverly Milch, PharmD Clinical Pharmacist [facility] [phone]

## 2020-10-01 ENCOUNTER — Other Ambulatory Visit: Payer: Self-pay

## 2020-10-01 ENCOUNTER — Telehealth: Payer: Self-pay

## 2020-10-01 ENCOUNTER — Other Ambulatory Visit: Payer: Medicare Other | Admitting: *Deleted

## 2020-10-01 VITALS — BP 124/64 | HR 89 | Temp 97.9°F | Resp 18 | Ht 64.0 in | Wt 151.4 lb

## 2020-10-01 DIAGNOSIS — Z515 Encounter for palliative care: Secondary | ICD-10-CM

## 2020-10-01 NOTE — Progress Notes (Signed)
COMMUNITY PALLIATIVE CARE RN NOTE  PATIENT NAME: Briana Barr DOB: [birth year] MRN: [id number]  PRIMARY CARE PROVIDER: [name], MD  RESPONSIBLE PARTY:  Acct ID - Guarantor Home Phone Work Phone Relationship Acct Type  [id number] Briana Barr, Briana Barr [phone]  Self P/F     [place], [place], [place] [address]   Covid-19 Prescreening Negative  PLAN OF CARE and INTERVENTION:  ADVANCE CARE PLANNING/GOALS OF CARE: Goal is for patient to remain in her home and avoid hospitalizations. She has a DNR. PATIENT/CAREGIVER EDUCATION: Symptom management, safe mobility, diabetes management, s/s of infection DISEASE STATUS: Face-to-face visit completed in patient's home. Both son's and daughter-in-law present during visit. Upon arrival, patient is sitting up in her recliner awake and alert. She is able to answer simple questions and make her needs known. Forgetful and some confusion at times. She denies pain at this time. She does experience pain at times in her right rib cage area, usually when trying to use the bathroom. Son reports that she had some pain in her mid-chest that was very brief. She remains ambulatory using her walker. She does need some assistance with bathing and at times dressing. She performs exercises given to her by PT almost daily. She continues on oxygen at 6L/min via [place]. She has a significant decrease in smoking cigarettes. She has a slight expiratory wheeze in her right upper lobe. She is taking her Albuterol nebulizer treatment every morning. She wears her cpap machine daily for 1 hour. She does not tolerate wearing it at night while she sleeps. She recently rode 2 hours to visit her sister and tolerated this well. Her appetite is good. She has been having elevated blood sugars for the past few months. It is ranging from 238-347 fasting in the morning. Call placed to Dr. Samella Parr nurse, Cathi Roan. She advised to continue to take her blood sugars fasting in the mornings and  to also check it 2 hours before lunch and dinner. Family is to keep a log for the next week. Contacted daughter-in-law [name] to let her know. She is also experiencing some occasional constipation. She continues on stool softeners daily and if no BM is several days they will give her a laxative. Patient had no complaints today. Son states that he noticed a small amount of blood in her Depends, but no associated pain. Advised to continue to monitor. She had a dentist appt yesterday to get 2 fillings and did well. Next appt in October.  HISTORY OF PRESENT ILLNESS:   This is a 80 yo female with a history of HTN, COPD and DM II. Palliative care team continues to follow patient and will visit patient monthly and PRN.       CODE STATUS: DNR ADVANCED DIRECTIVES: Y MOST FORM: no PPS: 50%   PHYSICAL EXAM:   VITALS: Today's Vitals   10/01/20 1135  BP: 124/64  Pulse: 89  Resp: 18  Temp: 97.9 F (36.6 C)  TempSrc: Temporal  SpO2: 98%  Weight: 151 lb 6.4 oz (68.7 kg)  Height: 5\' 4"  (1.626 m)  PainSc: 0-No pain    LUNGS:  expiratory wheeze in right upper lobe, continues Albuterol neb daily CARDIAC: Cor RRR EXTREMITIES: No edema SKIN:  Exposed skin is dry and intact; Denies skin issues   NEURO:  Alert and oriented to person/place, forgetful, ambulatory w/walker   (Duration of visit and documentation 60 minutes)   Daryl Eastern, RN BSN

## 2020-10-02 ENCOUNTER — Other Ambulatory Visit: Payer: Self-pay

## 2020-10-02 DIAGNOSIS — E119 Type 2 diabetes mellitus without complications: Secondary | ICD-10-CM

## 2020-10-02 MED ORDER — GLIPIZIDE 5 MG PO TABS: 5.0000 mg | ORAL_TABLET | Freq: Two times a day (BID) | ORAL | 3 refills | Status: DC

## 2020-10-02 MED ORDER — GLIPIZIDE 5 MG PO TABS: 5.0000 mg | ORAL_TABLET | Freq: Every day | ORAL | 3 refills | Status: DC

## 2020-10-02 NOTE — Telephone Encounter (Signed)
Spoke with Denyse Amass (daughter in law) Medication glipizide 5mg  poqam sent to the pharmacy. She informed that patient is living with her brother,  high blood sugars may be contributed to the type of food that is prepared. Family and nurse have spoken to brother about diet. Will send mychart message of fasting blood sugars in 1 wk for review

## 2020-10-03 ENCOUNTER — Other Ambulatory Visit: Payer: Self-pay

## 2020-10-03 ENCOUNTER — Telehealth: Payer: Self-pay

## 2020-10-03 DIAGNOSIS — E119 Type 2 diabetes mellitus without complications: Secondary | ICD-10-CM

## 2020-10-03 NOTE — Telephone Encounter (Signed)
Daughter called to ask for referral for diabetic nutrition therapy. Referral placed

## 2020-10-08 ENCOUNTER — Encounter: Payer: Self-pay | Admitting: Family Medicine

## 2020-10-09 NOTE — Telephone Encounter (Signed)
Please reach out to schedule appt with PCP when he returns to discuss this.

## 2020-10-17 ENCOUNTER — Encounter: Payer: Self-pay | Admitting: Family Medicine

## 2020-10-17 ENCOUNTER — Telehealth: Payer: Self-pay | Admitting: *Deleted

## 2020-10-17 ENCOUNTER — Telehealth: Payer: Self-pay | Admitting: Family Medicine

## 2020-10-17 NOTE — Telephone Encounter (Signed)
Pt daughter called in stating that pt is running a fever on and off, pt's daughter stated that she has also sent a messg via MyChart, but just wanted to know if dr. Would sent a prescription for an antibiotic. Please advise  Cb#: [phone]

## 2020-10-17 NOTE — Telephone Encounter (Signed)
Call placed to patient daughter in law, Janett Billow.   Reports that patient has no pain, is not coughing, has no issues with her urine, and no issues with breathing. Advised that appointment will be required for evaluation.   States that [facility] SN is coming out for evaluation in AM. OV scheduled with NP after [facility] nurse.

## 2020-10-17 NOTE — Telephone Encounter (Signed)
Received a communication from patient's daughter-in-law, [name] to advise that patient woke up this morning with a low grade temp of 99.1 and has since elevated to 99.5. She advised that she is going to give her some Tylenol and administering an in-home Covid test. She is requesting a RN visit tomorrow. Visit scheduled for 10:30a.

## 2020-10-18 ENCOUNTER — Telehealth: Payer: Self-pay | Admitting: Nurse Practitioner

## 2020-10-18 ENCOUNTER — Other Ambulatory Visit: Payer: Self-pay

## 2020-10-18 ENCOUNTER — Other Ambulatory Visit: Payer: Medicare Other | Admitting: *Deleted

## 2020-10-18 VITALS — BP 111/58 | HR 76 | Temp 98.7°F | Resp 18 | Ht 64.0 in | Wt 153.6 lb

## 2020-10-18 DIAGNOSIS — Z515 Encounter for palliative care: Secondary | ICD-10-CM

## 2020-10-18 NOTE — Progress Notes (Signed)
COMMUNITY PALLIATIVE CARE RN NOTE  PATIENT NAME: Briana Barr DOB: 05/12/1940 MRN: [id number]  PRIMARY CARE PROVIDER: [name], MD  RESPONSIBLE PARTY: [name] (daughter in law) Acct ID - Guarantor Home Phone Work Phone Relationship Acct Type  [id number] JUSTA, HATCHELL [phone]  Self P/F     [place], [place], [place] [address]   Covid-19 Pre-screening Negative  PLAN OF CARE and INTERVENTION:  ADVANCE CARE PLANNING/GOALS OF CARE: Goal is for patient to remain in her home and avoid hospitalizations. She has a DNR. PATIENT/CAREGIVER EDUCATION: Symptom management, blood sugar management, s/s of infection DISEASE STATUS: Face-to-face visit completed in patient's home. Met with patient, son Shanon Brow and daughter-in-law [name]. Jesy requested RN visit today. Patient started out with a temperature of 99.1 that elevated to 99.5 then 101.8 yesterday. Tylenol was given twice yesterday for a total of 2000 mg and she also takes Vicodin for pain. Her temperature has stayed down since last evening. She had 3 loose bowel movements yesterday, and this morning had a large incontinent BM (diarrhea) and had to be cleaned up. Temperature today is 98.7. She denies pain or discomfort. She did experience some abdominal pain during her BM this morning, but it subsided once she was finished. This morning at 2:30am Shanon Brow found her in the bathroom and patient said she thought she had vomited. There was no evidence per son that patient had vomited. She reports feeling much better today. Jesy gave patient an in-home Covid test yesterday and it was negative because she had been around family over the weekend and [place]. She does not have a cough or any issues with shortness of breath. Her lungs are clear today. She continues on Oxygen at 6L/min via [place]. Her blood sugars have improved since starting Glipizide and is now seldom over 200. She also continues on Metformin and Januvia. She  continues with a good appetite. Her weight is stable at 153.6 lbs. She has a telephone visit today with a PA from Dr. Samella Parr office for further assessment. Will continue to monitor.  HISTORY OF PRESENT ILLNESS:  This is a 80 yo female with a history of HTN, COPD and DM II. Palliative care team continues to follow patient and will visit patient monthly and PRN.       CODE STATUS: DNR ADVANCED DIRECTIVES: Y MOST FORM: no PPS: 50%   PHYSICAL EXAM:   VITALS: Today's Vitals   10/18/20 1052  BP: (!) 111/58  Pulse: 76  Resp: 18  Temp: 98.7 F (37.1 C)  TempSrc: Temporal  SpO2: 95%  Weight: 153 lb 9.6 oz (69.7 kg)  Height: $Remove'5\' 4"'dgwLKEh$  (1.626 m)  PainSc: 0-No pain    LUNGS: clear to auscultation  CARDIAC: Cor RRR EXTREMITIES: No edema SKIN:  Exposed skin is dry and intact; Denies any skin issues   NEURO:  Alert and oriented to person/place, some confusion at times, forgetful, ambulatory w/walker   (Duration of visit and documentation 75 minutes)   Daryl Eastern, RN BSN

## 2020-10-18 NOTE — Telephone Encounter (Signed)
Agree with plan and disposition.

## 2020-10-18 NOTE — Progress Notes (Deleted)
Subjective:    Patient ID: Briana Barr, female    DOB: [birth year], 80 y.o.   MRN: [id number]  HPI: Briana Barr is a 80 y.o. female presenting for  No chief complaint on file.  UPPER RESPIRATORY TRACT INFECTION Onset: COVID-19 testing history: COVID-19 vaccination status: Fever: {Blank single:19197::"yes","no"} Cough: {Blank single:19197::"yes","no"} Shortness of breath: {Blank single:19197::"yes","no"} Wheezing: {Blank single:19197::"yes","no"} Chest pain: {Blank single:19197::"yes","no","yes, with cough"} Chest tightness: {Blank single:19197::"yes","no"} Chest congestion: {Blank single:19197::"yes","no"} Nasal congestion: {Blank single:19197::"yes","no"} Runny nose: {Blank single:19197::"yes","no"} Post nasal drip: {Blank single:19197::"yes","no"} Sneezing: {Blank single:19197::"yes","no"} Sore throat: {Blank single:19197::"yes","no"} Swollen glands: {Blank single:19197::"yes","no"} Sinus pressure: {Blank single:19197::"yes","no"} Headache: {Blank single:19197::"yes","no"} Face pain: {Blank single:19197::"yes","no"} Toothache: {Blank single:19197::"yes","no"} Ear pain: {Blank single:19197::"yes","no"}  Ear pressure: {Blank single:19197::"yes","no"}  Eyes red/itching:{Blank single:19197::"yes","no"} Eye drainage/crusting: {Blank single:19197::"yes","no"}  Nausea: {Blank single:19197::"yes","no"}  Vomiting: {Blank single:19197::"yes","no"} Diarrhea: {Blank single:19197::"yes","no"}  Change in appetite: {Blank single:19197::"yes","no"}  Loss of taste/smell: {Blank single:19197::"yes","no"}  Rash: {Blank single:19197::"yes","no"} Fatigue: {Blank single:19197::"yes","no"} Sick contacts: {Blank single:19197::"yes","no"} Strep contacts: {Blank single:19197::"yes","no"}  Context: {Blank multiple:19196::"better","worse","stable","fluctuating"} Recurrent sinusitis: {Blank single:19197::"yes","no"} Treatments attempted:  Relief with OTC medications:   Allergies  Allergen  Reactions   Vioxx [Rofecoxib] Swelling    Outpatient Encounter Medications as of 10/18/2020  Medication Sig   acetaZOLAMIDE (DIAMOX) 250 MG tablet TAKE ONE TABLET BY MOUTH EVERY MORNING   albuterol (PROAIR HFA) 108 (90 Base) MCG/ACT inhaler Inhale 2 puffs into the lungs every 6 (six) hours as needed for wheezing or shortness of breath.   aspirin 81 MG tablet Take 81 mg by mouth daily.   Cholecalciferol (VITAMIN D3) 250 MCG (10000 UT) capsule Take 1 capsule (10,000 Units total) by mouth daily.   ELDERBERRY PO Take 1 each by mouth daily.   escitalopram (LEXAPRO) 10 MG tablet TAKE 1 TABLET BY MOUTH EVERY DAY (Patient taking differently: Take 10 mg by mouth daily.)   Fluticasone-Umeclidin-Vilant (TRELEGY ELLIPTA) 100-62.5-25 MCG/INH AEPB Inhale 1 Inhaler into the lungs daily.   glipiZIDE (GLUCOTROL) 5 MG tablet Take 1 tablet (5 mg total) by mouth daily before breakfast.   HYDROcodone-acetaminophen (NORCO/VICODIN) 5-325 MG tablet Take 1 tablet by mouth 4 (four) times daily as needed for severe pain.   ipratropium-albuterol (DUONEB) 0.5-2.5 (3) MG/3ML SOLN Take 3 mLs by nebulization every 6 (six) hours as needed. (Patient taking differently: Take 3 mLs by nebulization every 6 (six) hours as needed (SOB, wheezing).)   levofloxacin (LEVAQUIN) 500 MG tablet Take 1 tablet (500 mg total) by mouth daily.   losartan (COZAAR) 50 MG tablet Take 1 tablet (50 mg total) by mouth daily.   metFORMIN (GLUCOPHAGE) 500 MG tablet TAKE ONE TABLET BY MOUTH TWICE DAILY WITH FOOD   Multiple Vitamins-Minerals (CENTRUM SILVER 50+WOMEN) TABS Take 1 tablet by mouth daily.   omeprazole (PRILOSEC) 20 MG capsule TAKE 1 CAPSULE BY MOUTH TWICE A DAY BEFORE A MEAL (Patient taking differently: Take 20 mg by mouth 2 (two) times daily before a meal.)   predniSONE (DELTASONE) 20 MG tablet 3 tabs poqday 1-2, 2 tabs poqday 3-4, 1 tab poqday 5-6   rosuvastatin (CRESTOR) 20 MG tablet TAKE ONE TABLET BY MOUTH EVERY EVENING   sitaGLIPtin  (JANUVIA) 100 MG tablet Take 1 tablet (100 mg total) by mouth daily.   No facility-administered encounter medications on file as of 10/18/2020.    Patient Active Problem List   Diagnosis Date Noted   CAP (community acquired pneumonia) 03/14/2020   Bacteremia 11/14/2019   Acute respiratory failure with hypoxia ([place]) 11/13/2019   COPD (chronic obstructive pulmonary  disease) ([place]) 11/13/2019   Diabetes mellitus type 2 in nonobese [facility]) 11/13/2019   Essential hypertension 11/13/2019   Community acquired pneumonia of left lower lobe of lung    Abnormal CT scan, colon    Benign neoplasm of sigmoid colon    Benign neoplasm of rectum    Loss of weight    Epigastric abdominal tenderness without rebound tenderness    Anorexia     Past Medical History:  Diagnosis Date   Barrett esophagus    Colon polyps    benign   COPD (chronic obstructive pulmonary disease) (HCC)    Duodenal ulcer    DVT (deep venous thrombosis) (H[facility])    left leg x 2   Gallstones    GERD (gastroesophageal reflux disease)    Glaucoma    Hyperlipidemia    Ischemic colitis ([place])    Osteoporosis    Pneumonia    hx of x 2 as a child    Renal cyst, right    Retinopathy, background, proliferative     Relevant past medical, surgical, family and social history reviewed and updated as indicated. Interim medical history since our last visit reviewed.  Review of Systems  Per HPI unless specifically indicated above     Objective:    There were no vitals taken for this visit.  Wt Readings from Last 3 Encounters:  10/01/20 151 lb 6.4 oz (68.7 kg)  09/02/20 152 lb 6.4 oz (69.1 kg)  07/01/20 152 lb 6.4 oz (69.1 kg)    Physical Exam  Results for orders placed or performed during the hospital encounter of 03/13/20  Blood Culture (routine x 2)   Specimen: BLOOD  Result Value Ref Range   Specimen Description      BLOOD BLOOD RIGHT ARM Performed at [facility] [address], [place], [place]  [address]    Special Requests      BOTTLES DRAWN AEROBIC AND ANAEROBIC Blood Culture adequate volume Performed at [facility] [address] [place], [place] [address]    Culture      NO GROWTH 5 DAYS Performed at [facility], [place] [address] [place], [place] [address]    Report Status 03/19/2020 FINAL   Blood Culture (routine x 2)   Specimen: BLOOD  Result Value Ref Range   Specimen Description      BLOOD BLOOD RIGHT FOREARM Performed at [facility] [address], [place], [place] [address]    Special Requests      BOTTLES DRAWN AEROBIC AND ANAEROBIC Blood Culture results may not be optimal due to an excessive volume of blood received in culture bottles Performed at [facility] [address] [place], [place] [address]    Culture      NO GROWTH 5 DAYS Performed at [facility], [place] [address] [place], [place] [address]    Report Status 03/19/2020 FINAL   Urine culture   Specimen: In/Out Cath Urine  Result Value Ref Range   Specimen Description      IN/OUT CATH URINE Performed at [facility], [place] [address] [place], [place] [address]    Special Requests      NONE Performed at [facility], [place] [address], [place],  [address]    Culture MULTIPLE SPECIES PRESENT, SUGGEST RECOLLECTION (A)    Report Status 03/15/2020 FINAL   Resp Panel by RT-PCR (Flu A&B, Covid) Nasopharyngeal Swab   Specimen: Nasopharyngeal  Swab; Nasopharyngeal(NP) swabs in vial transport medium  Result Value Ref Range   SARS Coronavirus 2 by RT PCR NEGATIVE NEGATIVE   Influenza A by PCR NEGATIVE NEGATIVE   Influenza B by PCR NEGATIVE NEGATIVE  Lactic acid, plasma  Result Value Ref Range   Lactic Acid, Venous 1.6 0.5 - 1.9 mmol/L  Comprehensive metabolic panel  Result Value Ref Range   Sodium 136 135 - 145 mmol/L   Potassium 3.8 3.5 - 5.1 mmol/L   Chloride 102 98 - 111 mmol/L    CO2 25 22 - 32 mmol/L   Glucose, Bld 291 (H) 70 - 99 mg/dL   BUN 26 (H) 8 - 23 mg/dL   Creatinine, Ser 0.63 0.44 - 1.00 mg/dL   Calcium 8.6 (L) 8.9 - 10.3 mg/dL   Total Protein 7.0 6.5 - 8.1 g/dL   Albumin 3.6 3.5 - 5.0 g/dL   AST 17 15 - 41 U/L   ALT 19 0 - 44 U/L   Alkaline Phosphatase 55 38 - 126 U/L   Total Bilirubin 0.4 0.3 - 1.2 mg/dL   GFR, Estimated >60 >60 mL/min   Anion gap 9 5 - 15  CBC WITH DIFFERENTIAL  Result Value Ref Range   WBC 17.0 (H) 4.0 - 10.5 K/uL   RBC 3.83 (L) 3.87 - 5.11 MIL/uL   Hemoglobin 11.5 (L) 12.0 - 15.0 g/dL   HCT 37.8 36.0 - 46.0 %   MCV 98.7 80.0 - 100.0 fL   MCH 30.0 26.0 - 34.0 pg   MCHC 30.4 30.0 - 36.0 g/dL   RDW 13.3 11.5 - 15.5 %   Platelets 461 (H) 150 - 400 K/uL   nRBC 0.1 0.0 - 0.2 %   Neutrophils Relative % 85 %   Neutro Abs 14.5 (H) 1.7 - 7.7 K/uL   Lymphocytes Relative 3 %   Lymphs Abs 0.5 (L) 0.7 - 4.0 K/uL   Monocytes Relative 9 %   Monocytes Absolute 1.5 (H) 0.1 - 1.0 K/uL   Eosinophils Relative 1 %   Eosinophils Absolute 0.1 0.0 - 0.5 K/uL   Basophils Relative 1 %   Basophils Absolute 0.1 0.0 - 0.1 K/uL   Immature Granulocytes 1 %   Abs Immature Granulocytes 0.24 (H) 0.00 - 0.07 K/uL  Protime-INR  Result Value Ref Range   Prothrombin Time 13.4 11.4 - 15.2 seconds   INR 1.1 0.8 - 1.2  APTT  Result Value Ref Range   aPTT 37 (H) 24 - 36 seconds  Urinalysis, Routine w reflex microscopic  Result Value Ref Range   Color, Urine YELLOW YELLOW   APPearance HAZY (A) CLEAR   Specific Gravity, Urine 1.025 1.005 - 1.030   pH 5.0 5.0 - 8.0   Glucose, UA >=500 (A) NEGATIVE mg/dL   Hgb urine dipstick NEGATIVE NEGATIVE   Bilirubin Urine NEGATIVE NEGATIVE   Ketones, ur 5 (A) NEGATIVE mg/dL   Protein, ur NEGATIVE NEGATIVE mg/dL   Nitrite NEGATIVE NEGATIVE   Leukocytes,Ua NEGATIVE NEGATIVE   RBC / HPF 0-5 0 - 5 RBC/hpf   WBC, UA 0-5 0 - 5 WBC/hpf   Bacteria, UA RARE (A) NONE SEEN   Squamous Epithelial / LPF 0-5 0 - 5  CBC   Result Value Ref Range   WBC 14.8 (H) 4.0 - 10.5 K/uL   RBC 3.68 (L) 3.87 - 5.11 MIL/uL   Hemoglobin 11.0 (L) 12.0 - 15.0 g/dL   HCT 36.4 36.0 - 46.0 %   MCV 98.9 80.0 -  100.0 fL   MCH 29.9 26.0 - 34.0 pg   MCHC 30.2 30.0 - 36.0 g/dL   RDW 13.2 11.5 - 15.5 %   Platelets 390 150 - 400 K/uL   nRBC 0.0 0.0 - 0.2 %  Creatinine, serum  Result Value Ref Range   Creatinine, Ser 0.53 0.44 - 1.00 mg/dL   GFR, Estimated >60 >60 mL/min  Hemoglobin A1c  Result Value Ref Range   Hgb A1c MFr Bld 8.7 (H) 4.8 - 5.6 %   Mean Plasma Glucose 202.99 mg/dL  Glucose, capillary  Result Value Ref Range   Glucose-Capillary 207 (H) 70 - 99 mg/dL  Comprehensive metabolic panel  Result Value Ref Range   Sodium 139 135 - 145 mmol/L   Potassium 3.3 (L) 3.5 - 5.1 mmol/L   Chloride 103 98 - 111 mmol/L   CO2 25 22 - 32 mmol/L   Glucose, Bld 211 (H) 70 - 99 mg/dL   BUN 10 8 - 23 mg/dL   Creatinine, Ser 0.55 0.44 - 1.00 mg/dL   Calcium 9.1 8.9 - 10.3 mg/dL   Total Protein 6.8 6.5 - 8.1 g/dL   Albumin 3.3 (L) 3.5 - 5.0 g/dL   AST 23 15 - 41 U/L   ALT 26 0 - 44 U/L   Alkaline Phosphatase 50 38 - 126 U/L   Total Bilirubin 0.6 0.3 - 1.2 mg/dL   GFR, Estimated >60 >60 mL/min   Anion gap 11 5 - 15  CBC  Result Value Ref Range   WBC 11.7 (H) 4.0 - 10.5 K/uL   RBC 3.92 3.87 - 5.11 MIL/uL   Hemoglobin 11.7 (L) 12.0 - 15.0 g/dL   HCT 38.5 36.0 - 46.0 %   MCV 98.2 80.0 - 100.0 fL   MCH 29.8 26.0 - 34.0 pg   MCHC 30.4 30.0 - 36.0 g/dL   RDW 13.4 11.5 - 15.5 %   Platelets 405 (H) 150 - 400 K/uL   nRBC 0.0 0.0 - 0.2 %  Glucose, capillary  Result Value Ref Range   Glucose-Capillary 302 (H) 70 - 99 mg/dL  Glucose, capillary  Result Value Ref Range   Glucose-Capillary 191 (H) 70 - 99 mg/dL  Magnesium  Result Value Ref Range   Magnesium 1.7 1.7 - 2.4 mg/dL  Phosphorus  Result Value Ref Range   Phosphorus 2.7 2.5 - 4.6 mg/dL  Glucose, capillary  Result Value Ref Range   Glucose-Capillary 305 (H) 70 -  99 mg/dL  CBC with Differential/Platelet  Result Value Ref Range   WBC 10.7 (H) 4.0 - 10.5 K/uL   RBC 3.93 3.87 - 5.11 MIL/uL   Hemoglobin 11.6 (L) 12.0 - 15.0 g/dL   HCT 38.5 36.0 - 46.0 %   MCV 98.0 80.0 - 100.0 fL   MCH 29.5 26.0 - 34.0 pg   MCHC 30.1 30.0 - 36.0 g/dL   RDW 13.2 11.5 - 15.5 %   Platelets 464 (H) 150 - 400 K/uL   nRBC 0.0 0.0 - 0.2 %   Neutrophils Relative % 77 %   Neutro Abs 8.2 (H) 1.7 - 7.7 K/uL   Lymphocytes Relative 7 %   Lymphs Abs 0.8 0.7 - 4.0 K/uL   Monocytes Relative 10 %   Monocytes Absolute 1.1 (H) 0.1 - 1.0 K/uL   Eosinophils Relative 4 %   Eosinophils Absolute 0.5 0.0 - 0.5 K/uL   Basophils Relative 1 %   Basophils Absolute 0.1 0.0 - 0.1 K/uL   Immature Granulocytes  1 %   Abs Immature Granulocytes 0.11 (H) 0.00 - 0.07 K/uL  Comprehensive metabolic panel  Result Value Ref Range   Sodium 137 135 - 145 mmol/L   Potassium 3.9 3.5 - 5.1 mmol/L   Chloride 101 98 - 111 mmol/L   CO2 25 22 - 32 mmol/L   Glucose, Bld 206 (H) 70 - 99 mg/dL   BUN 17 8 - 23 mg/dL   Creatinine, Ser 0.51 0.44 - 1.00 mg/dL   Calcium 8.9 8.9 - 10.3 mg/dL   Total Protein 7.4 6.5 - 8.1 g/dL   Albumin 3.7 3.5 - 5.0 g/dL   AST 26 15 - 41 U/L   ALT 34 0 - 44 U/L   Alkaline Phosphatase 63 38 - 126 U/L   Total Bilirubin 0.5 0.3 - 1.2 mg/dL   GFR, Estimated >60 >60 mL/min   Anion gap 11 5 - 15  Magnesium  Result Value Ref Range   Magnesium 2.1 1.7 - 2.4 mg/dL  Phosphorus  Result Value Ref Range   Phosphorus 2.5 2.5 - 4.6 mg/dL  Vitamin B12  Result Value Ref Range   Vitamin B-12 632 180 - 914 pg/mL  Folate  Result Value Ref Range   Folate 21.0 >5.9 ng/mL  Iron and TIBC  Result Value Ref Range   Iron 41 28 - 170 ug/dL   TIBC 281 250 - 450 ug/dL   Saturation Ratios 15 10.4 - 31.8 %   UIBC 240 ug/dL  Ferritin  Result Value Ref Range   Ferritin 111 11 - 307 ng/mL  Reticulocytes  Result Value Ref Range   Retic Ct Pct 2.1 0.4 - 3.1 %   RBC. 3.90 3.87 - 5.11 MIL/uL    Retic Count, Absolute 81.5 19.0 - 186.0 K/uL   Immature Retic Fract 27.7 (H) 2.3 - 15.9 %  Glucose, capillary  Result Value Ref Range   Glucose-Capillary 236 (H) 70 - 99 mg/dL  Glucose, capillary  Result Value Ref Range   Glucose-Capillary 237 (H) 70 - 99 mg/dL  Glucose, capillary  Result Value Ref Range   Glucose-Capillary 240 (H) 70 - 99 mg/dL  Glucose, capillary  Result Value Ref Range   Glucose-Capillary 210 (H) 70 - 99 mg/dL  Glucose, capillary  Result Value Ref Range   Glucose-Capillary 174 (H) 70 - 99 mg/dL  Glucose, capillary  Result Value Ref Range   Glucose-Capillary 420 (H) 70 - 99 mg/dL  Glucose, capillary  Result Value Ref Range   Glucose-Capillary 288 (H) 70 - 99 mg/dL      Assessment & Plan:   Problem List Items Addressed This Visit   None    Follow up plan: No follow-ups on file.

## 2020-10-19 DIAGNOSIS — Z20822 Contact with and (suspected) exposure to covid-19: Secondary | ICD-10-CM | POA: Diagnosis not present

## 2020-10-28 ENCOUNTER — Telehealth: Payer: Self-pay | Admitting: Pharmacist

## 2020-10-28 NOTE — Progress Notes (Addendum)
Chronic Care Management Pharmacy Assistant   Name: Briana Barr  MRN: [id number] DOB: 12/20/1940  Reason for Encounter: Medication Review/Medication Coordination Call.   Medications: Outpatient Encounter Medications as of 10/28/2020  Medication Sig   acetaZOLAMIDE (DIAMOX) 250 MG tablet TAKE ONE TABLET BY MOUTH EVERY MORNING   albuterol (PROAIR HFA) 108 (90 Base) MCG/ACT inhaler Inhale 2 puffs into the lungs every 6 (six) hours as needed for wheezing or shortness of breath.   aspirin 81 MG tablet Take 81 mg by mouth daily.   Cholecalciferol (VITAMIN D3) 250 MCG (10000 UT) capsule Take 1 capsule (10,000 Units total) by mouth daily.   ELDERBERRY PO Take 1 each by mouth daily.   escitalopram (LEXAPRO) 10 MG tablet TAKE 1 TABLET BY MOUTH EVERY DAY (Patient taking differently: Take 10 mg by mouth daily.)   Fluticasone-Umeclidin-Vilant (TRELEGY ELLIPTA) 100-62.5-25 MCG/INH AEPB Inhale 1 Inhaler into the lungs daily.   glipiZIDE (GLUCOTROL) 5 MG tablet Take 1 tablet (5 mg total) by mouth daily before breakfast.   HYDROcodone-acetaminophen (NORCO/VICODIN) 5-325 MG tablet Take 1 tablet by mouth 4 (four) times daily as needed for severe pain.   ipratropium-albuterol (DUONEB) 0.5-2.5 (3) MG/3ML SOLN Take 3 mLs by nebulization every 6 (six) hours as needed. (Patient taking differently: Take 3 mLs by nebulization every 6 (six) hours as needed (SOB, wheezing).)   levofloxacin (LEVAQUIN) 500 MG tablet Take 1 tablet (500 mg total) by mouth daily.   losartan (COZAAR) 50 MG tablet Take 1 tablet (50 mg total) by mouth daily.   metFORMIN (GLUCOPHAGE) 500 MG tablet TAKE ONE TABLET BY MOUTH TWICE DAILY WITH FOOD   Multiple Vitamins-Minerals (CENTRUM SILVER 50+WOMEN) TABS Take 1 tablet by mouth daily.   omeprazole (PRILOSEC) 20 MG capsule TAKE 1 CAPSULE BY MOUTH TWICE A DAY BEFORE A MEAL (Patient taking differently: Take 20 mg by mouth 2 (two) times daily before a meal.)   predniSONE (DELTASONE) 20 MG tablet 3  tabs poqday 1-2, 2 tabs poqday 3-4, 1 tab poqday 5-6   rosuvastatin (CRESTOR) 20 MG tablet TAKE ONE TABLET BY MOUTH EVERY EVENING   sitaGLIPtin (JANUVIA) 100 MG tablet Take 1 tablet (100 mg total) by mouth daily.   No facility-administered encounter medications on file as of 10/28/2020.   Reviewed chart for medication changes ahead of medication coordination call.  No OVs, Consults, or hospital visits since last care coordination call.  BP Readings from Last 3 Encounters:  10/18/20 (!) 111/58  10/01/20 124/64  09/02/20 124/63    Lab Results  Component Value Date   HGBA1C 8.7 (H) 03/14/2020     Patient obtains medications through Adherence Packaging  30 Days   Last adherence delivery included:  Januvia 100 mg one tablet Breakfast  Escitalopram 10 mg one tablet at breakfast Acetazolamide 250 mg one tablet at breakfast Metformin 500 mg one tablet at breakfast and one tablet evening meal Omeprazole 20 mg one tablet at breakfast and one tablet evening meal Losartan Potassium 50 mg one tablet at breakfast Rosuvastatin 20 mg one tablet evening meal Trelegy Aer 100 mcg Ellipta 1 puff by mouth into lungs daily Hydroco/apap 5-325 mg PRN, 4 times daily  Patient declined meds last month: None.  Patient is due for next adherence delivery on: 11/07/20.  Called patient and reviewed medications and coordinated delivery.  This delivery to include: Januvia 100 mg one tablet Breakfast  Escitalopram 10 mg one tablet at breakfast Acetazolamide 250 mg one tablet at breakfast Metformin 500 mg one tablet  at breakfast and one tablet evening meal Omeprazole 20 mg one tablet at breakfast and one tablet evening meal Losartan Potassium 50 mg one tablet at breakfast Rosuvastatin 20 mg one tablet evening meal Trelegy Aer 100 mcg Ellipta 1 puff by mouth into lungs daily Hydroco/apap 5-325 mg PRN, 4 times daily (Pill bottle) Glipizide 5 mg 1 tablet daily before breakfast  Coordinated acute fill for  med: Glipizide 5 mg 1 tablet daily (six pills) on 11/01/20.  Patient declined the following medications: None.  Patient needs a refill on: Hydroco/apap 5-325 mg PRN, 4 times daily (Pill bottle)-Requested.  Confirmed delivery date of 11/07/20, advised patient that pharmacy will contact them the morning of delivery.  Follow-Up:Pharmacist Review  Charlann Lange, RMA Clinical Pharmacist Assistant [phone]  10 minutes spent in review, coordination, and documentation.  Reviewed by: Beverly Milch, PharmD Clinical Pharmacist [phone]

## 2020-10-29 ENCOUNTER — Other Ambulatory Visit: Payer: Self-pay | Admitting: *Deleted

## 2020-10-29 MED ORDER — HYDROCODONE-ACETAMINOPHEN 5-325 MG PO TABS: 1.0000 | ORAL_TABLET | Freq: Four times a day (QID) | ORAL | 0 refills | Status: DC | PRN

## 2020-10-29 NOTE — Telephone Encounter (Signed)
-----   Message from Rosetta Posner sent at 10/29/2020  8:47 AM EDT ----- Regarding: Medication Refill Hi can you send a refill to [facility] for the patients Hydroco/apap 5-325 mgPRN, 4 times daily, thank you.

## 2020-10-29 NOTE — Telephone Encounter (Signed)
Ok to refill??  Last office visit 04/12/2020.  Last refill 09/13/2020.

## 2020-11-20 ENCOUNTER — Encounter: Payer: Self-pay | Admitting: Registered"

## 2020-11-20 ENCOUNTER — Other Ambulatory Visit: Payer: Self-pay

## 2020-11-20 ENCOUNTER — Encounter: Payer: Medicare Other | Attending: Family Medicine | Admitting: Registered"

## 2020-11-20 DIAGNOSIS — E119 Type 2 diabetes mellitus without complications: Secondary | ICD-10-CM | POA: Diagnosis not present

## 2020-11-20 NOTE — Patient Instructions (Addendum)
Resources: Diabetes.org Beyond Type 2 app for phone  Plan:  Aim for 1-2 Carb Choices per meal (15-30 grams) adjust per blood sugar readings 2 hours after meals, but not too low.  Aim for 0-1 Carbs per snack if hungry  Include protein with your meals and snacks Include fruits and vegetables often Consider reading food labels for Total Carbohydrate of foods Consider checking BG at alternate times per day; times that give good information for your MD to manage medication are Fasting, bedtime, 2 hours after meals and/or sometimes before and after meal to see how it affect. Continue taking medication as directed by MD. Ask pharmacist and/or MD about albuterol breathing treatments effect on blood sugar to give you a better understanding of the numbers you are seeing

## 2020-11-20 NOTE — Progress Notes (Signed)
Diabetes Self-Management Education  Visit Type: First/Initial  Appt. Start Time: 1535 Appt. End Time: 1630  11/20/2020  Ms. Briana Barr, identified by name and date of birth, is a 80 y.o. female with a diagnosis of Diabetes: Type 2.   ASSESSMENT  There were no vitals taken for this visit. There is no height or weight on file to calculate BMI.  A1c 8.7% 03/14/20  Medications: Glipizide, metformin, Januvia, albuterol treatments for COPD, has history of prednisone but has not taken recently. Supplements: Vitamin D, multi-vitamin, elderberry  Patient's son states he weighs patient weekly and her weight has been stable.  Pt is her with son and daughter-in-law. Son has moved in to help take care of his mother and cooks her meals.   Family members state they are very motivated to patient's blood sugar stay below 200 mg/dL. States Glipizide was restarted about 6 weeks ago and has brought FBS down below 200 most days.  Patient's son states that patient's food preferences are changing and he tries to adjust what he prepares based on her food preferences and how the meals are affecting her blood sugar.   Patient states she likes ball park hot dogs, pumpernickel bread and marble rye bread.    Diabetes Self-Management Education - 11/20/20 1545       Visit Information   Visit Type First/Initial      Initial Visit   Diabetes Type Type 2    Are you currently following a meal plan? Yes    What type of meal plan do you follow? diabetic    Are you taking your medications as prescribed? Yes    Date Diagnosed 10+ years      Health Coping   How would you rate your overall health? Good      Psychosocial Assessment   Patient Belief/Attitude about Diabetes Motivated to manage diabetes    How often do you need to have someone help you when you read instructions, pamphlets, or other written materials from your doctor or pharmacy? 4 - Often    What is the last grade level you completed in school?  12      Complications   Last HgB A1C per patient/outside source 8.7 %    How often do you check your blood sugar? 1-2 times/day    Fasting Blood glucose range (mg/dL) 180-200;>200    Have you had a dilated eye exam in the past 12 months? No    Have you had a dental exam in the past 12 months? Yes    Are you checking your feet? Yes    How many days per week are you checking your feet? 7      Dietary Intake   Breakfast 3 eggs, ham or [place], vegetables, water    Snack (morning) none    Lunch 12 crackers chicken salad    Snack (afternoon) --    Pharmacist, hospital on a bun, mac & cheese, okra, corn, tomatoes   5-6 pm   Snack (evening) salami and cheese    Beverage(s) water, diet - caffiene coke      Exercise   Exercise Type Light (walking / raking leaves)    How many days per week to you exercise? 5    How many minutes per day do you exercise? 15    Total minutes per week of exercise 75      Patient Education   Previous Diabetes Education No    Nutrition management  Role of diet  in the treatment of diabetes and the relationship between the three main macronutrients and blood glucose level;Carbohydrate counting;Reviewed blood glucose goals for pre and post meals and how to evaluate the patients' food intake on their blood glucose level.    Medications Reviewed patients medication for diabetes, action, purpose, timing of dose and side effects.    Monitoring Purpose and frequency of SMBG.    Psychosocial adjustment Role of stress on diabetes      Individualized Goals (developed by patient)   Nutrition General guidelines for healthy choices and portions discussed    Medications take my medication as prescribed    Monitoring  test my blood glucose as discussed      Outcomes   Expected Outcomes Demonstrated interest in learning. Expect positive outcomes    Future DMSE PRN    Program Status Completed             Individualized Plan for Diabetes Self-Management Training:   Learning  Objective:  Patient will have a greater understanding of diabetes self-management. Patient education plan is to attend individual and/or group sessions per assessed needs and concerns.  Patient Instructions  Resources: Diabetes.org Beyond Type 2 app for phone  Plan:  Aim for 1-2 Carb Choices per meal (15-30 grams) adjust per blood sugar readings 2 hours after meals, but not too low.  Aim for 0-1 Carbs per snack if hungry  Include protein with your meals and snacks Include fruits and vegetables often Consider reading food labels for Total Carbohydrate of foods Consider checking BG at alternate times per day; times that give good information for your MD to manage medication are Fasting, bedtime, 2 hours after meals and/or sometimes before and after meal to see how it affect. Continue taking medication as directed by MD. Ask pharmacist and/or MD about albuterol breathing treatments effect on blood sugar to give you a better understanding of the numbers you are seeing   Expected Outcomes:  Demonstrated interest in learning. Expect positive outcomes  Education material provided: MyPlate, yellow carb card  If problems or questions, patient to contact team via:  Phone and MyChart  Future DSME appointment: PRN

## 2020-11-22 ENCOUNTER — Other Ambulatory Visit: Payer: Self-pay | Admitting: *Deleted

## 2020-11-22 ENCOUNTER — Telehealth: Payer: Self-pay | Admitting: Pharmacist

## 2020-11-22 MED ORDER — HYDROCODONE-ACETAMINOPHEN 5-325 MG PO TABS: 1.0000 | ORAL_TABLET | Freq: Four times a day (QID) | ORAL | 0 refills | Status: DC | PRN

## 2020-11-22 NOTE — Telephone Encounter (Signed)
Ok to refill??  Last office visit 04/12/2020.  Last refill 10/29/2020.  Of note, letter sent to patient to schedule OV.

## 2020-11-22 NOTE — Progress Notes (Addendum)
Chronic Care Management Pharmacy Assistant   Name: Sarye Javier  MRN: [id number] DOB: 10-31-1940  Reason for Encounter: Medication Review/Medication Coordination Call   Medications: Outpatient Encounter Medications as of 11/22/2020  Medication Sig   acetaZOLAMIDE (DIAMOX) 250 MG tablet TAKE ONE TABLET BY MOUTH EVERY MORNING   albuterol (PROAIR HFA) 108 (90 Base) MCG/ACT inhaler Inhale 2 puffs into the lungs every 6 (six) hours as needed for wheezing or shortness of breath.   aspirin 81 MG tablet Take 81 mg by mouth daily.   Cholecalciferol (VITAMIN D3) 250 MCG (10000 UT) capsule Take 1 capsule (10,000 Units total) by mouth daily.   ELDERBERRY PO Take 1 each by mouth daily.   escitalopram (LEXAPRO) 10 MG tablet TAKE 1 TABLET BY MOUTH EVERY DAY (Patient taking differently: Take 10 mg by mouth daily.)   Fluticasone-Umeclidin-Vilant (TRELEGY ELLIPTA) 100-62.5-25 MCG/INH AEPB Inhale 1 Inhaler into the lungs daily.   glipiZIDE (GLUCOTROL) 5 MG tablet Take 1 tablet (5 mg total) by mouth daily before breakfast.   HYDROcodone-acetaminophen (NORCO/VICODIN) 5-325 MG tablet Take 1 tablet by mouth 4 (four) times daily as needed for severe pain.   ipratropium-albuterol (DUONEB) 0.5-2.5 (3) MG/3ML SOLN Take 3 mLs by nebulization every 6 (six) hours as needed. (Patient taking differently: Take 3 mLs by nebulization every 6 (six) hours as needed (SOB, wheezing).)   levofloxacin (LEVAQUIN) 500 MG tablet Take 1 tablet (500 mg total) by mouth daily.   losartan (COZAAR) 50 MG tablet Take 1 tablet (50 mg total) by mouth daily.   metFORMIN (GLUCOPHAGE) 500 MG tablet TAKE ONE TABLET BY MOUTH TWICE DAILY WITH FOOD   Multiple Vitamins-Minerals (CENTRUM SILVER 50+WOMEN) TABS Take 1 tablet by mouth daily.   omeprazole (PRILOSEC) 20 MG capsule TAKE 1 CAPSULE BY MOUTH TWICE A DAY BEFORE A MEAL (Patient taking differently: Take 20 mg by mouth 2 (two) times daily before a meal.)   predniSONE (DELTASONE) 20 MG tablet 3  tabs poqday 1-2, 2 tabs poqday 3-4, 1 tab poqday 5-6   rosuvastatin (CRESTOR) 20 MG tablet TAKE ONE TABLET BY MOUTH EVERY EVENING   sitaGLIPtin (JANUVIA) 100 MG tablet Take 1 tablet (100 mg total) by mouth daily.   No facility-administered encounter medications on file as of 11/22/2020.    Reviewed chart for medication changes ahead of medication coordination call.  No OVs, Consults, or hospital visits since last care coordination call.  No medication changes indicated.  BP Readings from Last 3 Encounters:  10/18/20 (!) 111/58  10/01/20 124/64  09/02/20 124/63    Lab Results  Component Value Date   HGBA1C 8.7 (H) 03/14/2020     Patient obtains medications through Adherence Packaging  30 Days   Last adherence delivery included: Januvia 100 mg one tablet Breakfast  Escitalopram 10 mg one tablet at breakfast Acetazolamide 250 mg one tablet at breakfast Metformin 500 mg one tablet at breakfast and one tablet evening meal Omeprazole 20 mg one tablet at breakfast and one tablet evening meal Losartan Potassium 50 mg one tablet at breakfast Rosuvastatin 20 mg one tablet evening meal Trelegy Aer 100 mcg Ellipta 1 puff by mouth into lungs daily Hydroco/apap 5-325 mg PRN, 4 times daily (Pill bottle) Glipizide 5 mg 1 tablet daily before breakfast  Patient declined meds last month: None  Patient is due for next adherence delivery on: 12/06/20.  Called patient and reviewed medications and coordinated delivery.  This delivery to include: Januvia 100 mg one tablet Breakfast  Escitalopram 10 mg one  tablet at breakfast Acetazolamide 250 mg one tablet at breakfast Metformin 500 mg one tablet at breakfast and one tablet evening meal Omeprazole 20 mg one tablet at breakfast and one tablet evening meal Losartan Potassium 50 mg one tablet at breakfast Rosuvastatin 20 mg one tablet evening meal Trelegy Aer 100 mcg Ellipta 1 puff by mouth into lungs daily Hydroco/apap 5-325 mg PRN, 4 times  daily (Pill bottle) Glipizide 5 mg 1 tablet daily before breakfast  Patient declined the following medications: None  Patient does not need refills at this time.   Confirmed delivery date of 12/06/20, advised patient that pharmacy will contact them the morning of delivery.  Follow-Up:Pharmacist Review  Charlann Lange, RMA Clinical Pharmacist Assistant [phone]  10 minutes spent in review, coordination, and documentation.  Reviewed by: Beverly Milch, PharmD Clinical Pharmacist [phone]

## 2020-11-22 NOTE — Telephone Encounter (Signed)
-----   Message from Rosetta Posner sent at 11/22/2020 11:01 AM EDT ----- Regarding: Medication Refill Patient needs a refill on her hydrocodone sent to [facility], her delivery date for her medications are not till 12/06/20.

## 2020-12-09 ENCOUNTER — Other Ambulatory Visit: Payer: Self-pay

## 2020-12-09 ENCOUNTER — Ambulatory Visit (INDEPENDENT_AMBULATORY_CARE_PROVIDER_SITE_OTHER): Payer: Medicare Other | Admitting: Family Medicine

## 2020-12-09 ENCOUNTER — Ambulatory Visit
Admission: RE | Admit: 2020-12-09 | Discharge: 2020-12-09 | Disposition: A | Discharge status: Home / Self Care | Payer: Medicare Other | Source: Ambulatory Visit | Attending: Family Medicine | Admitting: Family Medicine

## 2020-12-09 VITALS — BP 140/60 | HR 80 | Temp 99.2°F | Resp 22 | Wt 157.2 lb

## 2020-12-09 DIAGNOSIS — E119 Type 2 diabetes mellitus without complications: Secondary | ICD-10-CM | POA: Diagnosis not present

## 2020-12-09 DIAGNOSIS — R059 Cough, unspecified: Secondary | ICD-10-CM | POA: Diagnosis not present

## 2020-12-09 DIAGNOSIS — Z981 Arthrodesis status: Secondary | ICD-10-CM | POA: Diagnosis not present

## 2020-12-09 DIAGNOSIS — J441 Chronic obstructive pulmonary disease with (acute) exacerbation: Secondary | ICD-10-CM

## 2020-12-09 DIAGNOSIS — R0989 Other specified symptoms and signs involving the circulatory and respiratory systems: Secondary | ICD-10-CM | POA: Diagnosis not present

## 2020-12-09 DIAGNOSIS — M47814 Spondylosis without myelopathy or radiculopathy, thoracic region: Secondary | ICD-10-CM | POA: Diagnosis not present

## 2020-12-09 MED ORDER — IPRATROPIUM-ALBUTEROL 0.5-2.5 (3) MG/3ML IN SOLN: 3.0000 mL | Freq: Four times a day (QID) | RESPIRATORY_TRACT | 1 refills | Status: DC | PRN

## 2020-12-09 NOTE — Progress Notes (Signed)
Subjective:    Patient ID: Briana Barr, female    DOB: 01/31/1941, 80 y.o.   MRN: [id number]  Patient is a very pleasant 80 year old Caucasian female who presents today for a follow-up.  She has end-stage COPD.  She is currently on 6 L of oxygen daily via nasal cannula.  She is on Trelegy once a day.  She is written to have DuoNebs every 6 hours as needed however she is only been using them once a day because she does not feel like she needs them.  Her daughter-in-law has been checking her sugars every morning.  They range between 102 and 190.  The vast majority are between 140 and 170.  These are fasting in the morning.  There are no sugars above 200 and there are no hypoglycemic episodes.  This is on a combination of Januvia, metformin 500 mg twice a day, and glipizide 5 mg a day.  I have made the decision to allow permissive hyperglycemia due to her poor life expectancy in an effort to avoid multiple sugar checks today and the risk of hypoglycemia as anticipate her life expectancy to be less than 24 months Past Medical History:  Diagnosis Date  . Barrett esophagus   . Colon polyps    benign  . COPD (chronic obstructive pulmonary disease) ([place])   . Duodenal ulcer   . DVT (deep venous thrombosis) (HCC)    left leg x 2  . Gallstones   . GERD (gastroesophageal reflux disease)   . Glaucoma   . Hyperlipidemia   . Ischemic colitis ([place])   . Osteoporosis   . Pneumonia    hx of x 2 as a child   . Renal cyst, right   . Retinopathy, background, proliferative    Past Surgical History:  Procedure Laterality Date  . cervical vertebral fusion     c5-6, 6-7  . CHOLECYSTECTOMY    . COLONOSCOPY WITH PROPOFOL N/A 11/23/2016   Procedure: COLONOSCOPY WITH PROPOFOL;  Surgeon: Ladene Artist, MD;  Location: [facility];  Service: Endoscopy;  Laterality: N/A;  . ESOPHAGOGASTRODUODENOSCOPY (EGD) WITH PROPOFOL N/A 11/23/2016   Procedure: ESOPHAGOGASTRODUODENOSCOPY (EGD) WITH PROPOFOL;  Surgeon: Ladene Artist, MD;  Location: [facility];  Service: Endoscopy;  Laterality: N/A;  . FEMORAL-POPLITEAL BYPASS GRAFT    . IR RADIOLOGIST EVAL & MGMT  06/06/2019  . IR RADIOLOGIST EVAL & MGMT  07/02/2020    Current Outpatient Medications on File Prior to Visit  Medication Sig Dispense Refill  . acetaZOLAMIDE (DIAMOX) 250 MG tablet TAKE ONE TABLET BY MOUTH EVERY MORNING 90 tablet 1  . albuterol (PROAIR HFA) 108 (90 Base) MCG/ACT inhaler Inhale 2 puffs into the lungs every 6 (six) hours as needed for wheezing or shortness of breath. 18 g 1  . aspirin 81 MG tablet Take 81 mg by mouth daily.    . Cholecalciferol (VITAMIN D3) 250 MCG (10000 UT) capsule Take 1 capsule (10,000 Units total) by mouth daily. 30 capsule   . ELDERBERRY PO Take 1 each by mouth daily.    Marland Kitchen escitalopram (LEXAPRO) 10 MG tablet TAKE 1 TABLET BY MOUTH EVERY DAY (Patient taking differently: Take 10 mg by mouth daily.) 90 tablet 3  . Fluticasone-Umeclidin-Vilant (TRELEGY ELLIPTA) 100-62.5-25 MCG/INH AEPB Inhale 1 Inhaler into the lungs daily. 1 each 11  . glipiZIDE (GLUCOTROL) 5 MG tablet Take 1 tablet (5 mg total) by mouth daily before breakfast. 30 tablet 3  . HYDROcodone-acetaminophen (NORCO/VICODIN) 5-325 MG tablet Take 1 tablet  by mouth 4 (four) times daily as needed for severe pain. 90 tablet 0  . ipratropium-albuterol (DUONEB) 0.5-2.5 (3) MG/3ML SOLN Take 3 mLs by nebulization every 6 (six) hours as needed. (Patient taking differently: Take 3 mLs by nebulization every 6 (six) hours as needed (SOB, wheezing).) 360 mL 1  . levofloxacin (LEVAQUIN) 500 MG tablet Take 1 tablet (500 mg total) by mouth daily. 7 tablet 0  . losartan (COZAAR) 50 MG tablet Take 1 tablet (50 mg total) by mouth daily. 90 tablet 3  . metFORMIN (GLUCOPHAGE) 500 MG tablet TAKE ONE TABLET BY MOUTH TWICE DAILY WITH FOOD 180 tablet 1  . Multiple Vitamins-Minerals (CENTRUM SILVER 50+WOMEN) TABS Take 1 tablet by mouth daily. 90 tablet   . omeprazole (PRILOSEC) 20 MG  capsule TAKE 1 CAPSULE BY MOUTH TWICE A DAY BEFORE A MEAL (Patient taking differently: Take 20 mg by mouth 2 (two) times daily before a meal.) 180 capsule 3  . predniSONE (DELTASONE) 20 MG tablet 3 tabs poqday 1-2, 2 tabs poqday 3-4, 1 tab poqday 5-6 12 tablet 0  . rosuvastatin (CRESTOR) 20 MG tablet TAKE ONE TABLET BY MOUTH EVERY EVENING 90 tablet 1  . sitaGLIPtin (JANUVIA) 100 MG tablet Take 1 tablet (100 mg total) by mouth daily. 90 tablet 3   No current facility-administered medications on file prior to visit.   Allergies  Allergen Reactions  . Vioxx [Rofecoxib] Swelling   Social History   Socioeconomic History  . Marital status: Widowed    Spouse name: Not on file  . Number of children: 3  . Years of education: Not on file  . Highest education level: Not on file  Occupational History  . Occupation: retired  Tobacco Use  . Smoking status: Every Day    Packs/day: 1.00    Types: Cigarettes  . Smokeless tobacco: Never  . Tobacco comments:    started age 73  Vaping Use  . Vaping Use: Never used  Substance and Sexual Activity  . Alcohol use: No  . Drug use: No  . Sexual activity: Not on file  Other Topics Concern  . Not on file  Social History Narrative  . Not on file   Social Determinants of Health   Financial Resource Strain: Low Risk   . Difficulty of Paying Living Expenses: Not very hard  Food Insecurity: Not on file  Transportation Needs: Not on file  Physical Activity: Not on file  Stress: Not on file  Social Connections: Not on file  Intimate Partner Violence: Not on file   Family History  Problem Relation Age of Onset  . Other Father        some sort of blood cancer-had to get transfusions  . Stomach cancer Maternal Grandmother   . Stomach cancer Maternal Grandfather       Review of Systems  All other systems reviewed and are negative.     Objective:   Physical Exam Vitals reviewed.  Constitutional:      General: She is not in acute  distress.    Appearance: She is well-developed. She is not diaphoretic.  HENT:     Head: Normocephalic and atraumatic.     Right Ear: External ear normal.     Left Ear: External ear normal.     Nose: Nose normal.     Mouth/Throat:     Pharynx: No oropharyngeal exudate.  Eyes:     General: No scleral icterus.       Right eye: No discharge.  Left eye: No discharge.     Conjunctiva/sclera: Conjunctivae normal.  Neck:     Thyroid: No thyromegaly.     Vascular: No JVD.  Cardiovascular:     Rate and Rhythm: Normal rate and regular rhythm.     Heart sounds: Normal heart sounds. No murmur heard.   No friction rub. No gallop.  Pulmonary:     Effort: Pulmonary effort is normal.     Breath sounds: Wheezing and rales present. No rhonchi.  Abdominal:     General: Bowel sounds are normal. There is no distension.     Palpations: Abdomen is soft. There is no mass.     Tenderness: There is no abdominal tenderness. There is no guarding or rebound.  Musculoskeletal:     Cervical back: Neck supple.  Lymphadenopathy:     Cervical: No cervical adenopathy.  Skin:    General: Skin is warm.     Coloration: Skin is not pale.     Findings: No erythema or rash.  Neurological:     Mental Status: She is alert and oriented to person, place, and time.     Cranial Nerves: No cranial nerve deficit.     Motor: No abnormal muscle tone.     Coordination: Coordination normal.     Deep Tendon Reflexes: Reflexes are normal and symmetric.  Psychiatric:        Behavior: Behavior normal.        Thought Content: Thought content normal.        Judgment: Judgment normal.          Assessment & Plan:  Diabetes mellitus type 2 in nonobese (HCC) - Plan: Hemoglobin A1c, CBC with Differential/Platelet, COMPLETE METABOLIC PANEL WITH GFR, Hemoglobin A1c  Cough - Plan: DG Chest 2 View  COPD exacerbation (HCC) - Plan: ipratropium-albuterol (DUONEB) 0.5-2.5 (3) MG/3ML SOLN Patient has right basilar crackles  and her daughter states that she has been having low-grade fevers to 99.6 every morning at home for the last week.  Therefore I will obtain a chest x-ray.  I encouraged him to continue Trelegy daily and use albuterol nebs every 6 hours as needed.  They do not have to schedule this but can give it to the patient as needed for shortness of breath or wheezing.  Obtain an A1c, CBC, and CMP.  I am happy with an A1c less than 7.5 in this patient in this circumstance.  I explained this to the patient and her daughter-in-law as best I could.  I do not feel an endocrinology consult is warranted.  If A1c is much greater than 7.5 we will need to add additional medication and she will need to start checking her sugar 2-3 times a day however I want to avoid this in this patient with a short life expectancy

## 2020-12-10 LAB — CBC WITH DIFFERENTIAL/PLATELET
Absolute Monocytes: 1019 {cells}/uL — ABNORMAL HIGH (ref 200–950)
Basophils Absolute: 68 {cells}/uL (ref 0–200)
Basophils Relative: 0.7 %
Eosinophils Absolute: 281 {cells}/uL (ref 15–500)
Eosinophils Relative: 2.9 %
HCT: 39.2 % (ref 35.0–45.0)
Hemoglobin: 12 g/dL (ref 11.7–15.5)
Lymphs Abs: 1251 {cells}/uL (ref 850–3900)
MCH: 28.2 pg (ref 27.0–33.0)
MCHC: 30.6 g/dL — ABNORMAL LOW (ref 32.0–36.0)
MCV: 92 fL (ref 80.0–100.0)
MPV: 10.7 fL (ref 7.5–12.5)
Monocytes Relative: 10.5 %
Neutro Abs: 7081 {cells}/uL (ref 1500–7800)
Neutrophils Relative %: 73 %
Platelets: 469 10*3/uL — ABNORMAL HIGH (ref 140–400)
RBC: 4.26 Million/uL (ref 3.80–5.10)
RDW: 12.9 % (ref 11.0–15.0)
Total Lymphocyte: 12.9 %
WBC: 9.7 10*3/uL (ref 3.8–10.8)

## 2020-12-10 LAB — COMPLETE METABOLIC PANEL WITH GFR
AG Ratio: 1.8 (calc) (ref 1.0–2.5)
ALT: 20 U/L (ref 6–29)
AST: 15 U/L (ref 10–35)
Albumin: 4.2 g/dL (ref 3.6–5.1)
Alkaline phosphatase (APISO): 68 U/L (ref 37–153)
BUN/Creatinine Ratio: 46 (calc) — ABNORMAL HIGH (ref 6–22)
BUN: 27 mg/dL — ABNORMAL HIGH (ref 7–25)
CO2: 31 mmol/L (ref 20–32)
Calcium: 9.7 mg/dL (ref 8.6–10.4)
Chloride: 103 mmol/L (ref 98–110)
Creat: 0.59 mg/dL — ABNORMAL LOW (ref 0.60–1.00)
Globulin: 2.4 g/dL (calc) (ref 1.9–3.7)
Glucose, Bld: 121 mg/dL — ABNORMAL HIGH (ref 65–99)
Potassium: 4.5 mmol/L (ref 3.5–5.3)
Sodium: 142 mmol/L (ref 135–146)
Total Bilirubin: 0.3 mg/dL (ref 0.2–1.2)
Total Protein: 6.6 g/dL (ref 6.1–8.1)
eGFR: 92 mL/min/{1.73_m2} (ref >=60)

## 2020-12-10 LAB — HEMOGLOBIN A1C
Hgb A1c MFr Bld: 7.7 %{Hb} — ABNORMAL HIGH
Mean Plasma Glucose: 174 mg/dL
eAG (mmol/L): 9.7 mmol/L

## 2020-12-12 ENCOUNTER — Other Ambulatory Visit: Payer: Self-pay | Admitting: *Deleted

## 2020-12-12 MED ORDER — GLIPIZIDE ER 5 MG PO TB24: 5.0000 mg | ORAL_TABLET | Freq: Every day | ORAL | 3 refills | Status: DC

## 2020-12-12 MED ORDER — METFORMIN HCL 1000 MG PO TABS: 1000.0000 mg | ORAL_TABLET | Freq: Two times a day (BID) | ORAL | 3 refills | Status: DC

## 2020-12-22 ENCOUNTER — Other Ambulatory Visit: Payer: Self-pay | Admitting: Family Medicine

## 2020-12-24 MED ORDER — GLIPIZIDE ER 5 MG PO TB24: 5.0000 mg | ORAL_TABLET | Freq: Every day | ORAL | 3 refills | Status: DC

## 2020-12-26 ENCOUNTER — Telehealth: Payer: Self-pay | Admitting: Pharmacist

## 2020-12-26 ENCOUNTER — Other Ambulatory Visit: Payer: Self-pay | Admitting: *Deleted

## 2020-12-26 MED ORDER — HYDROCODONE-ACETAMINOPHEN 5-325 MG PO TABS: 1.0000 | ORAL_TABLET | Freq: Four times a day (QID) | ORAL | 0 refills | Status: DC | PRN

## 2020-12-26 NOTE — Progress Notes (Addendum)
Chronic Care Management Pharmacy Assistant   Name: Briana Barr  MRN: [id number] DOB: [birth year]  Reason for Encounter: Medication Review/Medication Coordination Call   Medications: Outpatient Encounter Medications as of 12/26/2020  Medication Sig   acetaZOLAMIDE (DIAMOX) 250 MG tablet TAKE ONE TABLET BY MOUTH EVERY MORNING   albuterol (PROAIR HFA) 108 (90 Base) MCG/ACT inhaler Inhale 2 puffs into the lungs every 6 (six) hours as needed for wheezing or shortness of breath.   aspirin 81 MG tablet Take 81 mg by mouth daily.   Cholecalciferol (VITAMIN D3) 250 MCG (10000 UT) capsule Take 1 capsule (10,000 Units total) by mouth daily.   ELDERBERRY PO Take 1 each by mouth daily.   escitalopram (LEXAPRO) 10 MG tablet TAKE 1 TABLET BY MOUTH EVERY DAY (Patient taking differently: Take 10 mg by mouth daily.)   Fluticasone-Umeclidin-Vilant (TRELEGY ELLIPTA) 100-62.5-25 MCG/INH AEPB Inhale 1 Inhaler into the lungs daily.   glipiZIDE (GLUCOTROL XL) 5 MG 24 hr tablet Take 1 tablet (5 mg total) by mouth daily with breakfast.   HYDROcodone-acetaminophen (NORCO/VICODIN) 5-325 MG tablet Take 1 tablet by mouth 4 (four) times daily as needed for severe pain.   ipratropium-albuterol (DUONEB) 0.5-2.5 (3) MG/3ML SOLN Take 3 mLs by nebulization every 6 (six) hours as needed.   losartan (COZAAR) 50 MG tablet Take 1 tablet (50 mg total) by mouth daily.   metFORMIN (GLUCOPHAGE) 1000 MG tablet Take 1 tablet (1,000 mg total) by mouth 2 (two) times daily with a meal.   Multiple Vitamins-Minerals (CENTRUM SILVER 50+WOMEN) TABS Take 1 tablet by mouth daily.   omeprazole (PRILOSEC) 20 MG capsule TAKE 1 CAPSULE BY MOUTH TWICE A DAY BEFORE A MEAL (Patient taking differently: Take 20 mg by mouth 2 (two) times daily before a meal.)   rosuvastatin (CRESTOR) 20 MG tablet TAKE ONE TABLET BY MOUTH EVERY EVENING   sitaGLIPtin (JANUVIA) 100 MG tablet Take 1 tablet (100 mg total) by mouth daily.   No facility-administered  encounter medications on file as of 12/26/2020.   Reviewed chart for medication changes ahead of medication coordination call.  No Consults, or hospital visits since last care coordination call.  Office Visit: 12/09/20 Dr. Dennard Schaumann For follow-up. No medication changes.   No medication changes indicated.  BP Readings from Last 3 Encounters:  12/09/20 140/60  10/18/20 (!) 111/58  10/01/20 124/64    Lab Results  Component Value Date   HGBA1C 7.7 (H) 12/09/2020     Patient obtains medications through Adherence Packaging  30 Days   Last adherence delivery included:  Januvia 100 mg one tablet Breakfast  Escitalopram 10 mg one tablet at breakfast Acetazolamide 250 mg one tablet at breakfast Metformin 500 mg one tablet at breakfast and one tablet evening meal Omeprazole 20 mg one tablet at breakfast and one tablet evening meal Losartan Potassium 50 mg one tablet at breakfast Rosuvastatin 20 mg one tablet evening meal Trelegy Aer 100 mcg Ellipta 1 puff by mouth into lungs daily Hydroco/apap 5-325 mg PRN, 4 times daily (Pill bottle) Glipizide 5 mg 1 tablet daily before breakfast  Patient declined meds last month: None.   Patient is due for next adherence delivery on: 01/03/21.  Called patient and reviewed medications and coordinated delivery.  This delivery to include: Januvia 100 mg one tablet Breakfast  Escitalopram 10 mg one tablet at breakfast Acetazolamide 250 mg one tablet at breakfast Metformin 1000 mg one tablet at breakfast and one tablet evening meal Omeprazole 20 mg one tablet at breakfast and  one tablet evening meal Losartan Potassium 50 mg one tablet at breakfast Rosuvastatin 20 mg one tablet evening meal Trelegy Aer 100 mcg Ellipta 1 puff by mouth into lungs daily Hydroco/apap 5-325 mg PRN, 4 times daily (Pill bottle) Glipizide er 5 mg 1 tablet daily breakfast  Patient declined the following medications: None  Patient needs refills for: Hydroco/apap 5-325 mg  PRN, 4 times daily (Pill bottle).  Confirmed delivery date of 01/03/21, advised patient that pharmacy will contact them the morning of delivery.   Care Gaps:Patient is due for her eye exam.   Follow-Up:Pharmacist Review  Charlann Lange, RMA Clinical Pharmacist Assistant [phone]  10 minutes spent in review, coordination, and documentation.  Reviewed by: Beverly Milch, PharmD Clinical Pharmacist [phone]

## 2020-12-26 NOTE — Telephone Encounter (Signed)
-----   Message from Rosetta Posner sent at 12/26/2020 12:44 PM EDT ----- Regarding: Medication Refill Hi can you send a refill for the patients Hydroco/apap 5-325 mgPRN, 4 times daily to [facility], thanks.

## 2020-12-26 NOTE — Telephone Encounter (Signed)
Ok to refill??  Last office visit 12/09/2020.  Last refill 11/22/2020.

## 2021-01-22 ENCOUNTER — Telehealth: Payer: Self-pay | Admitting: Pharmacist

## 2021-01-22 ENCOUNTER — Other Ambulatory Visit: Payer: Self-pay | Admitting: *Deleted

## 2021-01-22 MED ORDER — ESCITALOPRAM OXALATE 10 MG PO TABS: 10.0000 mg | ORAL_TABLET | Freq: Every day | ORAL | 3 refills | Status: DC

## 2021-01-22 NOTE — Telephone Encounter (Signed)
-----   Message from [place] sent at 01/22/2021  1:30 PM EDT ----- Regarding: Medication Refills Hi, patient needs a refill for Escitalopram 10 mg one tablet at breakfast and Hydroco/apap 5-325 mgPRN, 4 times daily(Pill bottle), sent to [facility], Thanks.

## 2021-01-22 NOTE — Progress Notes (Addendum)
Chronic Care Management Pharmacy Assistant   Name: Briana Barr  MRN: [id number] DOB: 1941/03/11  Reason for Encounter: Medication Review/Medication Coordination Call  Medications: Outpatient Encounter Medications as of 01/22/2021  Medication Sig   acetaZOLAMIDE (DIAMOX) 250 MG tablet TAKE ONE TABLET BY MOUTH EVERY MORNING   albuterol (PROAIR HFA) 108 (90 Base) MCG/ACT inhaler Inhale 2 puffs into the lungs every 6 (six) hours as needed for wheezing or shortness of breath.   aspirin 81 MG tablet Take 81 mg by mouth daily.   Cholecalciferol (VITAMIN D3) 250 MCG (10000 UT) capsule Take 1 capsule (10,000 Units total) by mouth daily.   ELDERBERRY PO Take 1 each by mouth daily.   escitalopram (LEXAPRO) 10 MG tablet TAKE 1 TABLET BY MOUTH EVERY DAY (Patient taking differently: Take 10 mg by mouth daily.)   Fluticasone-Umeclidin-Vilant (TRELEGY ELLIPTA) 100-62.5-25 MCG/INH AEPB Inhale 1 Inhaler into the lungs daily.   glipiZIDE (GLUCOTROL XL) 5 MG 24 hr tablet Take 1 tablet (5 mg total) by mouth daily with breakfast.   HYDROcodone-acetaminophen (NORCO/VICODIN) 5-325 MG tablet Take 1 tablet by mouth 4 (four) times daily as needed for severe pain.   ipratropium-albuterol (DUONEB) 0.5-2.5 (3) MG/3ML SOLN Take 3 mLs by nebulization every 6 (six) hours as needed.   losartan (COZAAR) 50 MG tablet Take 1 tablet (50 mg total) by mouth daily.   metFORMIN (GLUCOPHAGE) 1000 MG tablet Take 1 tablet (1,000 mg total) by mouth 2 (two) times daily with a meal.   Multiple Vitamins-Minerals (CENTRUM SILVER 50+WOMEN) TABS Take 1 tablet by mouth daily.   omeprazole (PRILOSEC) 20 MG capsule TAKE 1 CAPSULE BY MOUTH TWICE A DAY BEFORE A MEAL (Patient taking differently: Take 20 mg by mouth 2 (two) times daily before a meal.)   rosuvastatin (CRESTOR) 20 MG tablet TAKE ONE TABLET BY MOUTH EVERY EVENING   sitaGLIPtin (JANUVIA) 100 MG tablet Take 1 tablet (100 mg total) by mouth daily.   No facility-administered  encounter medications on file as of 01/22/2021.   Reviewed chart for medication changes ahead of medication coordination call.  No OVs, Consults, or hospital visits since last care coordination call.  No medication changes indicated.  BP Readings from Last 3 Encounters:  12/09/20 140/60  10/18/20 (!) 111/58  10/01/20 124/64    Lab Results  Component Value Date   HGBA1C 7.7 (H) 12/09/2020     Patient obtains medications through Adherence Packaging  30 Days   Last adherence delivery included:  Januvia 100 mg one tablet Breakfast  Escitalopram 10 mg one tablet at breakfast Acetazolamide 250 mg one tablet at breakfast Metformin 500 mg one tablet at breakfast and one tablet evening meal Omeprazole 20 mg one tablet at breakfast and one tablet evening meal Losartan Potassium 50 mg one tablet at breakfast Rosuvastatin 20 mg one tablet evening meal Trelegy Aer 100 mcg Ellipta 1 puff by mouth into lungs daily Hydroco/apap 5-325 mg PRN, 4 times daily (Pill bottle) Glipizide 5 mg 1 tablet daily before breakfast  Patient declined meds last month: None.  Patient is due for next adherence delivery on: 02/04/21.  Called patient and reviewed medications and coordinated delivery.  This delivery to include: Januvia 100 mg one tablet Breakfast  Escitalopram 10 mg one tablet at breakfast Acetazolamide 250 mg one tablet at breakfast Metformin 500 mg one tablet at breakfast and one tablet evening meal Omeprazole 20 mg one tablet at breakfast and one tablet evening meal Losartan Potassium 50 mg one tablet at breakfast Rosuvastatin  20 mg one tablet evening meal Trelegy Aer 100 mcg Ellipta 1 puff by mouth into lungs daily Hydroco/apap 5-325 mg PRN, 4 times daily (Pill bottle) Glipizide ER 5 mg 1 tablet daily before breakfast  Patient declined the following medications: None.  Patient needs refills for: Escitalopram 10 mg one tablet at breakfast and Hydroco/apap 5-325 mg PRN, 4 times daily  (Pill bottle)-Requested.  Confirmed delivery date of 02/04/21, advised patient that pharmacy will contact them the morning of delivery.  Follow-Up:Pharmacist Review  Charlann Lange, RMA Clinical Pharmacist Assistant [phone]  8 minutes spent in review, coordination, and documentation.  Reviewed by: Beverly Milch, PharmD Clinical Pharmacist [phone]

## 2021-01-22 NOTE — Telephone Encounter (Signed)
Lexapro sent to pharmacy.   Ok to refill Hydrocodone/APAP??  Last office visit 12/09/2020.  Last refill 12/26/2020.

## 2021-01-23 MED ORDER — HYDROCODONE-ACETAMINOPHEN 5-325 MG PO TABS: 1.0000 | ORAL_TABLET | Freq: Four times a day (QID) | ORAL | 0 refills | Status: DC | PRN

## 2021-02-18 ENCOUNTER — Telehealth: Payer: Self-pay | Admitting: *Deleted

## 2021-02-18 NOTE — Telephone Encounter (Signed)
1:44 pm Called patient's daughter-in-law, [name], and left a voicemail to follow up to see how patient is doing and to schedule a palliative care home visit. Contact information left for return call.

## 2021-02-19 ENCOUNTER — Telehealth: Payer: Self-pay | Admitting: Pharmacist

## 2021-02-19 ENCOUNTER — Other Ambulatory Visit: Payer: Self-pay | Admitting: *Deleted

## 2021-02-19 MED ORDER — LOSARTAN POTASSIUM 50 MG PO TABS: 50.0000 mg | ORAL_TABLET | Freq: Every day | ORAL | 3 refills | Status: DC

## 2021-02-19 MED ORDER — OMEPRAZOLE 20 MG PO CPDR: DELAYED_RELEASE_CAPSULE | ORAL | 3 refills | Status: DC

## 2021-02-19 NOTE — Progress Notes (Addendum)
Chronic Care Management Pharmacy Assistant   Name: Emanie Facey  MRN: [id number] DOB: 31-Aug-1940  Reason for Encounter: Medication Review/Medication Coordination Call  Medications: Outpatient Encounter Medications as of 02/19/2021  Medication Sig   acetaZOLAMIDE (DIAMOX) 250 MG tablet TAKE ONE TABLET BY MOUTH EVERY MORNING   albuterol (PROAIR HFA) 108 (90 Base) MCG/ACT inhaler Inhale 2 puffs into the lungs every 6 (six) hours as needed for wheezing or shortness of breath.   aspirin 81 MG tablet Take 81 mg by mouth daily.   Cholecalciferol (VITAMIN D3) 250 MCG (10000 UT) capsule Take 1 capsule (10,000 Units total) by mouth daily.   ELDERBERRY PO Take 1 each by mouth daily.   escitalopram (LEXAPRO) 10 MG tablet Take 1 tablet (10 mg total) by mouth daily.   Fluticasone-Umeclidin-Vilant (TRELEGY ELLIPTA) 100-62.5-25 MCG/INH AEPB Inhale 1 Inhaler into the lungs daily.   glipiZIDE (GLUCOTROL XL) 5 MG 24 hr tablet Take 1 tablet (5 mg total) by mouth daily with breakfast.   HYDROcodone-acetaminophen (NORCO/VICODIN) 5-325 MG tablet Take 1 tablet by mouth 4 (four) times daily as needed for severe pain.   ipratropium-albuterol (DUONEB) 0.5-2.5 (3) MG/3ML SOLN Take 3 mLs by nebulization every 6 (six) hours as needed.   losartan (COZAAR) 50 MG tablet Take 1 tablet (50 mg total) by mouth daily.   metFORMIN (GLUCOPHAGE) 1000 MG tablet Take 1 tablet (1,000 mg total) by mouth 2 (two) times daily with a meal.   Multiple Vitamins-Minerals (CENTRUM SILVER 50+WOMEN) TABS Take 1 tablet by mouth daily.   omeprazole (PRILOSEC) 20 MG capsule TAKE 1 CAPSULE BY MOUTH TWICE A DAY BEFORE A MEAL (Patient taking differently: Take 20 mg by mouth 2 (two) times daily before a meal.)   rosuvastatin (CRESTOR) 20 MG tablet TAKE ONE TABLET BY MOUTH EVERY EVENING   sitaGLIPtin (JANUVIA) 100 MG tablet Take 1 tablet (100 mg total) by mouth daily.   No facility-administered encounter medications on file as of 02/19/2021.    Reviewed chart for medication changes ahead of medication coordination call.  No OVs, Consults, or hospital visits since last care coordination call.  No medication changes indicated.  BP Readings from Last 3 Encounters:  12/09/20 140/60  10/18/20 (!) 111/58  10/01/20 124/64    Lab Results  Component Value Date   HGBA1C 7.7 (H) 12/09/2020     Patient obtains medications through Adherence Packaging  30 Days   Last adherence delivery included:  Januvia 100 mg one tablet Breakfast  Escitalopram 10 mg one tablet at breakfast Acetazolamide 250 mg one tablet at breakfast Metformin 500 mg one tablet at breakfast and one tablet evening meal Omeprazole 20 mg one tablet at breakfast and one tablet evening meal Losartan Potassium 50 mg one tablet at breakfast Rosuvastatin 20 mg one tablet evening meal Trelegy Aer 100 mcg Ellipta 1 puff by mouth into lungs daily Hydroco/apap 5-325 mg PRN, 4 times daily (Pill bottle) Glipizide ER 5 mg 1 tablet daily before breakfast  Patient declined meds last month: None.   Patient is due for next adherence delivery on: 03/04/21. Called patient and reviewed medications and coordinated delivery.  This delivery to include: Januvia 100 mg one tablet Breakfast  Escitalopram 10 mg one tablet at breakfast Acetazolamide 250 mg one tablet at breakfast Metformin 500 mg one tablet at breakfast and one tablet evening meal Omeprazole 20 mg one tablet at breakfast and one tablet evening meal Losartan Potassium 50 mg one tablet at breakfast Rosuvastatin 20 mg one tablet evening meal  Trelegy Aer 100 mcg Ellipta 1 puff by mouth into lungs daily Hydroco/apap 5-325 mg PRN, 4 times daily (Pill bottle) Glipizide ER 5 mg 1 tablet daily before breakfast  Patient declined the following medications: None.  Patient needs refills for: omeprazole, losartan potassium, hydrocodone-apap-requested.  Confirmed delivery date of 03/04/21, advised patient that pharmacy  will contact them the morning of delivery.  Follow-Up:Pharmacist Review  Charlann Lange, RMA Clinical Pharmacist Assistant [phone]  10 minutes spent in review, coordination, and documentation.  Reviewed by: Beverly Milch, PharmD Clinical Pharmacist [phone]

## 2021-02-19 NOTE — Telephone Encounter (Signed)
-----   Message from Rosetta Posner sent at 02/19/2021  3:51 PM EST ----- Regarding: Medication Refill Hi can you send a refill for omeprazole, losartan potassium, hydrocodone-apap [facility]. Thank you.

## 2021-02-19 NOTE — Telephone Encounter (Signed)
Routine medications sent to pharmacy per protocol.   Ok to refill Hydrocodone/APAP??  Last office visit 12/09/2020.  Last refill 01/23/2021.

## 2021-02-20 MED ORDER — HYDROCODONE-ACETAMINOPHEN 5-325 MG PO TABS: 1.0000 | ORAL_TABLET | Freq: Four times a day (QID) | ORAL | 0 refills | Status: DC | PRN

## 2021-02-27 ENCOUNTER — Telehealth: Payer: Self-pay

## 2021-02-27 NOTE — Telephone Encounter (Signed)
1240 pm.  Incoming call from [place].  Primary caregiver tested + for COVID yesterday morning at 730 am.  Likely exposure occurred over the weekend. Caregiver immediately left patient's home and is quarantining.  Family tested patient yesterday and she was negative and has presented with no symptoms.  Has been fully vaccinated.  Reviewed CDC guidelines with [facility] and encouraged caregiver to follow up with PCP.  [name] reported caregiver has contacted PCP and he will test again on Sunday which is day 5.  Symptoms for caregiver have been minimal with manageable fever, sniffles and sore throat.  Currently taking acetaminophen.  Advised that caregiver should be fever free without the use of acetaminophen for 24 hours.  Encouraged use of mask even after symptoms resolve for at least 10 days total.  Encouraged to follow up back up with PCP should symptoms worsen.

## 2021-02-27 NOTE — Telephone Encounter (Signed)
1228 pm.  Request received to contact Briana Barr regarding a possible COVID exposure.  Phone call made to [facility] but no answer.  Message left requesting a call back.

## 2021-03-01 ENCOUNTER — Telehealth: Payer: Self-pay | Admitting: Nurse Practitioner

## 2021-03-01 DIAGNOSIS — U071 COVID-19: Secondary | ICD-10-CM

## 2021-03-01 MED ORDER — MOLNUPIRAVIR EUA 200MG CAPSULE: 4.0000 | ORAL_CAPSULE | Freq: Two times a day (BID) | ORAL | 0 refills | Status: AC

## 2021-03-01 NOTE — Telephone Encounter (Signed)
02/28/2021 19:30 - received page from [facility]/Palliative for this patient.  She informed me patient tested positive for COVID-19 today and family reports oxygen saturation 89%.  EMS called out to home to evaluate patient.  Agree with care plan.  03/01/2021 13:30 - received page from [facility], caregiver.  Reports EMS came to home yesterday and evaluated the patient.  Oxygen saturation had improved and no acute shortness of breath or chest pain, so determined safe to stay at home for now.  Today, oxygen saturation has been 94% and heart rate 79 per Jesy.  Jesy asked about antiviral therapy.  Patient is high risk given her age and comorbidities.  Given statin use, start molnupiravir 4 tablets twice daily for 5 days, prescription sent to pharmacy. I encouraged Jesy to call EMS if symptoms worsen or with oxygen saturation less than 90% despite use of nebulizer.

## 2021-03-03 ENCOUNTER — Other Ambulatory Visit: Payer: Medicare Other | Admitting: *Deleted

## 2021-03-03 ENCOUNTER — Other Ambulatory Visit: Payer: Self-pay

## 2021-03-03 DIAGNOSIS — Z515 Encounter for palliative care: Secondary | ICD-10-CM

## 2021-03-03 NOTE — Progress Notes (Signed)
[facility] COMMUNITY PALLIATIVE CARE RN NOTE  PATIENT NAME: Briana Barr DOB: 06/23/1940 MRN: [id number]  PRIMARY CARE PROVIDER: [name], MD  RESPONSIBLE PARTY: [name] (daughter-in-law) Acct ID - Guarantor Home Phone Work Phone Relationship Acct Type  [id number] [name], [name] [phone]  Self P/F     [address] [place], [place], [place] [address]   Due to the COVID-19 crisis, this virtual check-in visit was done via telephone from my office and it was initiated and consent by this patient and or family.  PLAN OF CARE and INTERVENTION:  ADVANCE CARE PLANNING/GOALS OF CARE: Goal is for patient to remain in her home and avoid hospitalizations. She has a DNR. PATIENT/CAREGIVER EDUCATION: Symptom management DISEASE STATUS: Producer, television/film/video visit completed via telephone. Patient tested positive for Covid on 02/28/21. Patient's son tested positive on 02/26/21. Over the weekend, patient was having issues with elevated temperatures despite the use of Tylenol and oxygen levels started to fall into the 80s on 6L of oxygen over the course of a day. It was recommended that EMS be contacted. EMS was able to get patient's oxygen levels above 90% and suggested that she remain in the home. Her PCP was able to prescribe an antiviral medication so patient was not transported to the hospital. She is currently taking 4 pills twice daily x 5 days. Her first dose was given the evening of 03/01/21. [name] states that patient is doing ok. The main issue is with nasal and sinus congestion. Denies shortness of breath. Remains on 6L of oxygen. Her oxygen level is 94%, pulse 90. She has not had a fever today. Current temperature 98.9. She remains able to ambulate using her walker. Her son Shanon Brow continues to assist her with ADLs. Her blood sugar was 154 and she remains on Metformin and Glipizide ER for this. She will contact palliative care with any concerns. She is appreciative of follow-up.  HISTORY OF  PRESENT ILLNESS:  This is a 80 yo female with a history of HTN, COPD and DM II. She is currently Covid positive. Palliative care team continues to follow patient for additional support, goals of care and complex decision making.      CODE STATUS: DNR ADVANCED DIRECTIVES: Y MOST FORM: no PPS: 50%   (Duration of visit and documentation 30 minutes)   Daryl Eastern, RN BSN

## 2021-03-04 ENCOUNTER — Encounter: Payer: Self-pay | Admitting: Family Medicine

## 2021-03-04 NOTE — Telephone Encounter (Signed)
Please advise, thanks.

## 2021-03-04 NOTE — Telephone Encounter (Signed)
Guaifenesin is safest - helps mucus come out.  Do not want to suppress congested cough especially for her because we want the sputum to come out.  If cough has turned dry, we can try Tessalon perles, let me know and I will send in

## 2021-03-10 ENCOUNTER — Telehealth: Payer: Self-pay | Admitting: *Deleted

## 2021-03-10 DIAGNOSIS — Z515 Encounter for palliative care: Secondary | ICD-10-CM

## 2021-03-10 NOTE — Telephone Encounter (Signed)
Received a call from patient's daughter-in-law, Briana Barr. She says that patient does not appear to be getting worse, but she is not getting better. She was diagnosed with Covid 10 days ago. Her concern is that patient has completed the 5 day course of anti-viral medication and is still very tired and not feeling like getting out of bed much. She also had some increased bladder incontinence episodes. She is still eating and drinking. She is afebrile. No shortness of breath. Her oxygen saturations have stayed >90% on 6L/min via [place]. Her blood sugars have been stable (less than 200). I advised her that it will take some time for patient to regain her strength and energy and to focus on letting her get rest. Advised Jesy to call us if she starts to experience fever, shortness of breath, drop in oxygen level or any worsening symptoms. She is appreciative of information and will relay this information to her caregiver, who is her son Briana Barr.

## 2021-03-19 ENCOUNTER — Telehealth: Payer: Self-pay | Admitting: *Deleted

## 2021-03-19 DIAGNOSIS — Z515 Encounter for palliative care: Secondary | ICD-10-CM

## 2021-03-19 NOTE — Telephone Encounter (Signed)
3:15p Received a call from patient's daughter-in-law, [name]. She is concerned that patient has a dark bruise noted to her bottom. She sent a picture of the area via secured line. It appears that it may be a deep pressure injury. [name] says that patient often falls asleep on the commode and could be sitting there up to 20 minutes per occurrence. Her son Shanon Brow is her caregiver, and sometimes starts to perform other necessary household tasks and will not realize that patient has been in there so long. They have started setting a timer. Recommended that they make sure they relieve pressure from her bottom as much as possible and to also change positions often. The skin is not broken nor skin chaffed at this time. She also advised that patient is recovering from her recent Covid-19 infection and is getting stronger and cognitively better. She is appreciative of information.

## 2021-03-24 ENCOUNTER — Telehealth: Payer: Self-pay | Admitting: Pharmacist

## 2021-03-24 ENCOUNTER — Other Ambulatory Visit: Payer: Self-pay

## 2021-03-24 MED ORDER — HYDROCODONE-ACETAMINOPHEN 5-325 MG PO TABS: 1.0000 | ORAL_TABLET | Freq: Four times a day (QID) | ORAL | 0 refills | Status: DC | PRN

## 2021-03-24 MED ORDER — SITAGLIPTIN PHOSPHATE 100 MG PO TABS: 100.0000 mg | ORAL_TABLET | Freq: Every day | ORAL | 3 refills | Status: DC

## 2021-03-24 MED ORDER — TRELEGY ELLIPTA 100-62.5-25 MCG/ACT IN AEPB: 1.0000 | INHALATION_SPRAY | Freq: Every day | RESPIRATORY_TRACT | 11 refills | Status: DC

## 2021-03-24 NOTE — Telephone Encounter (Signed)
LOV 12/09/20 Last refill 02/20/21 #90 0 refills  Please review, thanks!

## 2021-03-24 NOTE — Chronic Care Management (AMB) (Addendum)
Chronic Care Management Pharmacy Assistant   Name: Briana Barr  MRN: [id number] DOB: 1940-08-16  Reason for Encounter: Medication Coordination Call    Recent office visits:  12/09/2020 OV (PCP) [name], MD; continue trelegy daily, use albuterol nebs every 6 hours as needed.  Recent consult visits:  None since last CPP visit  Hospital visits:  None in previous 6 months  Medications: Outpatient Encounter Medications as of 03/24/2021  Medication Sig   acetaZOLAMIDE (DIAMOX) 250 MG tablet TAKE ONE TABLET BY MOUTH EVERY MORNING   albuterol (PROAIR HFA) 108 (90 Base) MCG/ACT inhaler Inhale 2 puffs into the lungs every 6 (six) hours as needed for wheezing or shortness of breath.   aspirin 81 MG tablet Take 81 mg by mouth daily.   Cholecalciferol (VITAMIN D3) 250 MCG (10000 UT) capsule Take 1 capsule (10,000 Units total) by mouth daily.   ELDERBERRY PO Take 1 each by mouth daily.   escitalopram (LEXAPRO) 10 MG tablet Take 1 tablet (10 mg total) by mouth daily.   Fluticasone-Umeclidin-Vilant (TRELEGY ELLIPTA) 100-62.5-25 MCG/INH AEPB Inhale 1 Inhaler into the lungs daily.   glipiZIDE (GLUCOTROL XL) 5 MG 24 hr tablet Take 1 tablet (5 mg total) by mouth daily with breakfast.   HYDROcodone-acetaminophen (NORCO/VICODIN) 5-325 MG tablet Take 1 tablet by mouth 4 (four) times daily as needed for severe pain.   ipratropium-albuterol (DUONEB) 0.5-2.5 (3) MG/3ML SOLN Take 3 mLs by nebulization every 6 (six) hours as needed.   losartan (COZAAR) 50 MG tablet Take 1 tablet (50 mg total) by mouth daily.   metFORMIN (GLUCOPHAGE) 1000 MG tablet Take 1 tablet (1,000 mg total) by mouth 2 (two) times daily with a meal.   Multiple Vitamins-Minerals (CENTRUM SILVER 50+WOMEN) TABS Take 1 tablet by mouth daily.   omeprazole (PRILOSEC) 20 MG capsule TAKE 1 CAPSULE BY MOUTH TWICE A DAY BEFORE A MEAL   rosuvastatin (CRESTOR) 20 MG tablet TAKE ONE TABLET BY MOUTH EVERY EVENING   sitaGLIPtin (JANUVIA)  100 MG tablet Take 1 tablet (100 mg total) by mouth daily.   No facility-administered encounter medications on file as of 03/24/2021.    BP Readings from Last 3 Encounters:  12/09/20 140/60  10/18/20 (!) 111/58  10/01/20 124/64    Lab Results  Component Value Date   HGBA1C 7.7 (H) 12/09/2020     Patient obtains medications through Adherence Packaging  30 Days   Last adherence delivery included:  Januvia 100 mg one tablet Breakfast  Escitalopram 10 mg one tablet at breakfast Acetazolamide 250 mg one tablet at breakfast Metformin 500 mg one tablet at breakfast and one tablet evening meal Omeprazole 20 mg one tablet at breakfast and one tablet evening meal Losartan Potassium 50 mg one tablet at breakfast Rosuvastatin 20 mg one tablet evening meal Trelegy Aer 100 mcg Ellipta 1 puff by mouth into lungs daily Hydroco/apap 5-325 mg PRN, 4 times daily (Pill bottle) Glipizide ER 5 mg 1 tablet daily before breakfast  Patient is due for next adherence delivery on: 04/03/2021. Called patient and reviewed medications and coordinated delivery.  This delivery to include: Acetazolamide 250 mg one tablet at breakfast Glipizide ER 5 mg one tablet at breakfast Metformin 100 mg one at evening meal Rosuvastatin 20 mg one at evening meal Escitalopram 10 mg one at breakfast Losartan Potassium 50 mg one at breakfast Omeprazole 20 mg one at breakfast, one at evening meal Januvia 100 mg one at breakfast Trelegy 100 mcg one puff daily Hydrocodone-APAP 5-325 mg  q4hrs prn  Patient needs refills for: Trelegy 100 mcg Januvia 100 mg  Hydrocodone-APAP 5-325 mg -Rx's requested from patients PCP  Confirmed delivery date of 04/03/2021, advised patient that pharmacy will contact them the morning of delivery.   Medicare Annual Wellness: Due now, pt daughter declines Ophthalmology Exam: Overdue sine 12/31/2018 Foot Exam: Overdue- never done Hemoglobin A1C: 7.7% on 12/09/2020 Colonoscopy: Aged out,  last completed 11/23/2016 Dexa Scan: Completed   Future Appointments  Date Time Provider [place]  03/27/2021 10:30 AM [facility] HEALTH ADVISOR [id number] None     April D Calhoun, [place] Pharmacist Assistant [phone]   10 minutes spent in review, coordination, and documentation.  Reviewed by: Beverly Milch, PharmD Clinical Pharmacist [phone]

## 2021-03-25 ENCOUNTER — Other Ambulatory Visit: Payer: Self-pay

## 2021-03-25 ENCOUNTER — Other Ambulatory Visit: Payer: Medicare Other | Admitting: *Deleted

## 2021-03-25 VITALS — BP 110/65 | HR 86 | Temp 97.7°F | Resp 18

## 2021-03-25 DIAGNOSIS — Z515 Encounter for palliative care: Secondary | ICD-10-CM

## 2021-03-25 NOTE — Progress Notes (Signed)
[facility] COMMUNITY PALLIATIVE CARE RN NOTE  PATIENT NAME: Briana Barr DOB: 04/25/1940 MRN: [id number]  PRIMARY CARE PROVIDER: [name], MD  RESPONSIBLE PARTY: [name] (daughter-in-law) Acct ID - Guarantor Home Phone Work Phone Relationship Acct Type  [id number] CARLICIA, LEAVENS [phone]  Self P/F     [place], [place], [place] [address]   Covid-19 Pre-screening Negative  PLAN OF CARE and INTERVENTION:  ADVANCE CARE PLANNING/GOALS OF CARE: Goal is for patient to remain in her home with her son Shanon Brow as her caregiver and avoid hospitalizations. She has a DNR. PATIENT/CAREGIVER EDUCATION: Symptom management, skin breakdown management/prevention, s/s of infection DISEASE STATUS: RN follow-up palliative care visit made in patient's home. Patient, son Shanon Brow and daughter-in-law [name] present on the visit. Patient is sitting up in her recliner awake and alert. She is able to answer questions and make her needs known. She is forgetful at times. She just recently recovered from Covid-19 and says she is feeling much better. She denies pain or shortness of breath. She remains on oxygen at 6L/min via [place]. She has a very slight expiratory wheeze bilaterally from her COPD. She does her Albuterol nebulizer treatment each morning. She is now keeping her c-pap machine on during the night from 7-9 hours, even though she does not like wearing this. Shanon Brow says her mind seems clearer since she has been wearing it more during the night. She remains ambulatory using her walker. No recent falls. She is performing her exercises daily. Her appetite is good. Her weight this week is 157 lbs. Her blood sugars have stayed below 200. CBG today was 157. Her Glipizide was changed to extended release and her Metformin increased to 1000 mg twice daily. She also takes Januvia. This combination is working well. Shanon Brow also is very conscious about their diet as he is also a diabetic. No recent issues with  constipation. Last bowel movement was 03/23/21. Assessed her bottom and she has a small dark bluish area noted to the buttocks in the middle of the fold. This area is improving as Shanon Brow is making sure that patient does not spend excessive amounts of time sitting on the commode during the night or day. Overall, family feels patient is returning back to her baseline.   HISTORY OF PRESENT ILLNESS: This is a 80 yo female with a history of HTN, COPD and DM II. Palliative care team continues to follow patient for additional support, goals of care and complex decision making.       CODE STATUS: DNR ADVANCED DIRECTIVES: Y MOST FORM: no PPS: 50%   PHYSICAL EXAM:   VITALS: Today's Vitals   03/25/21 1003  BP: 110/65  Pulse: 86  Resp: 18  Temp: 97.7 F (36.5 C)  TempSrc: Temporal  SpO2: 95%  PainSc: 0-No pain    LUNGS: expiratory wheezes bilaterally CARDIAC: Cor RRR EXTREMITIES: No edema SKIN:  See above note   NEURO:  Alert and oriented x 3, intermittent confusion, forgetful, generalized weakness, ambulatory w/walker   (Duration of visit and documentation 60 minutes)    Daryl Eastern, RN BSN

## 2021-03-27 ENCOUNTER — Other Ambulatory Visit: Payer: Self-pay

## 2021-03-27 ENCOUNTER — Other Ambulatory Visit: Payer: Self-pay | Admitting: Family Medicine

## 2021-03-27 ENCOUNTER — Ambulatory Visit (INDEPENDENT_AMBULATORY_CARE_PROVIDER_SITE_OTHER): Payer: Medicare Other

## 2021-03-27 VITALS — Ht 64.0 in | Wt 157.0 lb

## 2021-03-27 DIAGNOSIS — Z0000 Encounter for general adult medical examination without abnormal findings: Secondary | ICD-10-CM

## 2021-03-27 NOTE — Progress Notes (Signed)
Subjective:   Briana Barr is a 80 y.o. female who presents for Medicare Annual (Subsequent) preventive examination. Virtual Visit via Telephone Note  I connected with  Briana Barr on 03/27/21 at 10:30 AM EST by telephone and verified that I am speaking with the correct person using two identifiers.  Location: Patient: Home Provider: [facility] Persons participating in the virtual visit: patient/Nurse Health Advisor   I discussed the limitations, risks, security and privacy concerns of performing an evaluation and management service by telephone and the availability of in person appointments. The patient expressed understanding and agreed to proceed.  Interactive audio and video telecommunications were attempted between this nurse and patient, however failed, due to patient having technical difficulties OR patient did not have access to video capability.  We continued and completed visit with audio only.  Some vital signs may be absent or patient reported.   Chriss Driver, LPN  Review of Systems     Cardiac Risk Factors include: advanced age (>71men, >76 women);hypertension;dyslipidemia;diabetes mellitus;sedentary lifestyle     Objective:    Today's Vitals   03/27/21 1034  Weight: 157 lb (71.2 kg)  Height: 5\' 4"  (1.626 m)   Body mass index is 26.95 kg/m.  Advanced Directives 03/27/2021 11/20/2020 03/13/2020 11/13/2019 06/17/2018 11/23/2016 11/18/2016  Does Patient Have a Medical Advance Directive? Yes Yes No Yes Yes Yes Yes  Type of Paramedic of [place];Living will - - Out of facility DNR (pink MOST or yellow form) [place];Living will Living will Living will  Does patient want to make changes to medical advance directive? - - - No - Patient declined - - -  Copy of [place] in Chart? Yes - validated most recent copy scanned in chart (See row information) - - - - No - copy requested No - copy requested  Would patient  like information on creating a medical advance directive? - No - Patient declined No - Patient declined - - - -  Pre-existing out of facility DNR order (yellow form or pink MOST form) - - - Yellow form placed in chart (order not valid for inpatient use) - - -    Current Medications (verified) Outpatient Encounter Medications as of 03/27/2021  Medication Sig   acetaZOLAMIDE (DIAMOX) 250 MG tablet TAKE ONE TABLET BY MOUTH EVERY MORNING   albuterol (PROAIR HFA) 108 (90 Base) MCG/ACT inhaler Inhale 2 puffs into the lungs every 6 (six) hours as needed for wheezing or shortness of breath.   aspirin 81 MG tablet Take 81 mg by mouth daily.   Cholecalciferol (VITAMIN D3) 250 MCG (10000 UT) capsule Take 1 capsule (10,000 Units total) by mouth daily.   ELDERBERRY PO Take 1 each by mouth daily.   escitalopram (LEXAPRO) 10 MG tablet Take 1 tablet (10 mg total) by mouth daily.   Fluticasone-Umeclidin-Vilant (TRELEGY ELLIPTA) 100-62.5-25 MCG/ACT AEPB Inhale 1 Dose into the lungs daily.   glipiZIDE (GLUCOTROL XL) 5 MG 24 hr tablet Take 1 tablet (5 mg total) by mouth daily with breakfast.   HYDROcodone-acetaminophen (NORCO/VICODIN) 5-325 MG tablet Take 1 tablet by mouth 4 (four) times daily as needed for severe pain.   ipratropium-albuterol (DUONEB) 0.5-2.5 (3) MG/3ML SOLN Take 3 mLs by nebulization every 6 (six) hours as needed.   losartan (COZAAR) 50 MG tablet Take 1 tablet (50 mg total) by mouth daily.   metFORMIN (GLUCOPHAGE) 1000 MG tablet Take 1 tablet (1,000 mg total) by mouth 2 (two) times daily  with a meal.   Multiple Vitamins-Minerals (CENTRUM SILVER 50+WOMEN) TABS Take 1 tablet by mouth daily.   omeprazole (PRILOSEC) 20 MG capsule TAKE 1 CAPSULE BY MOUTH TWICE A DAY BEFORE A MEAL   rosuvastatin (CRESTOR) 20 MG tablet TAKE ONE TABLET BY MOUTH EVERY EVENING   sitaGLIPtin (JANUVIA) 100 MG tablet Take 1 tablet (100 mg total) by mouth daily.   No facility-administered encounter medications on file as of  03/27/2021.    Allergies (verified) Vioxx [rofecoxib]   History: Past Medical History:  Diagnosis Date   Barrett esophagus    Colon polyps    benign   COPD (chronic obstructive pulmonary disease) (HCC)    Duodenal ulcer    DVT (deep venous thrombosis) (HCC)    left leg x 2   Gallstones    GERD (gastroesophageal reflux disease)    Glaucoma    Hyperlipidemia    Ischemic colitis ([place])    Osteoporosis    Pneumonia    hx of x 2 as a child    Renal cyst, right    Retinopathy, background, proliferative    Past Surgical History:  Procedure Laterality Date   cervical vertebral fusion     c5-6, 6-7   CHOLECYSTECTOMY     COLONOSCOPY WITH PROPOFOL N/A 11/23/2016   Procedure: COLONOSCOPY WITH PROPOFOL;  Surgeon: Ladene Artist, MD;  Location: [facility];  Service: Endoscopy;  Laterality: N/A;   ESOPHAGOGASTRODUODENOSCOPY (EGD) WITH PROPOFOL N/A 11/23/2016   Procedure: ESOPHAGOGASTRODUODENOSCOPY (EGD) WITH PROPOFOL;  Surgeon: Ladene Artist, MD;  Location: [facility];  Service: Endoscopy;  Laterality: N/A;   FEMORAL-POPLITEAL BYPASS GRAFT     IR RADIOLOGIST EVAL & MGMT  06/06/2019   IR RADIOLOGIST EVAL & MGMT  07/02/2020   Family History  Problem Relation Age of Onset   Other Father        some sort of blood cancer-had to get transfusions   Stomach cancer Maternal Grandmother    Stomach cancer Maternal Grandfather    Social History   Socioeconomic History   Marital status: Widowed    Spouse name: Not on file   Number of children: 3   Years of education: Not on file   Highest education level: Not on file  Occupational History   Occupation: retired  Tobacco Use   Smoking status: Every Day    Packs/day: 1.00    Types: Cigarettes   Smokeless tobacco: Never   Tobacco comments:    started age 47  Vaping Use   Vaping Use: Never used  Substance and Sexual Activity   Alcohol use: No   Drug use: No   Sexual activity: Not Currently  Other Topics Concern   Not on file   Social History Narrative   Not on file   Social Determinants of Health   Financial Resource Strain: Low Risk    Difficulty of Paying Living Expenses: Not hard at all  Food Insecurity: No Food Insecurity   Worried About Charity fundraiser in the Last Year: Never true   [place] in the Last Year: Never true  Transportation Needs: No Transportation Needs   Lack of Transportation (Medical): No   Lack of Transportation (Non-Medical): No  Physical Activity: Insufficiently Active   Days of Exercise per Week: 5 days   Minutes of Exercise per Session: 20 min  Stress: No Stress Concern Present   Feeling of Stress : Not at all  Social Connections: Moderately Integrated   Frequency of Communication  with Friends and Family: More than three times a week   Frequency of Social Gatherings with Friends and Family: More than three times a week   Attends Religious Services: 1 to 4 times per year   Active Member of [employer] or Organizations: Yes   Attends Archivist Meetings: Never   Marital Status: Widowed    Tobacco Counseling Ready to quit: Not Answered Counseling given: Not Answered Tobacco comments: started age 18   Clinical Intake:  Pre-visit preparation completed: Yes  Pain : No/denies pain     BMI - recorded: 26.95 Nutritional Status: BMI 25 -29 Overweight Nutritional Risks: None Diabetes: Yes  How often do you need to have someone help you when you read instructions, pamphlets, or other written materials from your doctor or pharmacy?: 1 - Never  Diabetic?Nutrition Risk Assessment:  Has the patient had any N/V/D within the last 2 months?  No  Does the patient have any non-healing wounds?  No  Has the patient had any unintentional weight loss or weight gain?  No   Diabetes:  Is the patient diabetic?  Yes  If diabetic, was a CBG obtained today?  No  Did the patient bring in their glucometer from home?  No  How often do you monitor your CBG's? Daily.    Financial Strains and Diabetes Management:  Are you having any financial strains with the device, your supplies or your medication? No .  Does the patient want to be seen by Chronic Care Management for management of their diabetes?  No  Would the patient like to be referred to a Nutritionist or for Diabetic Management?  No   Diabetic Exams:  Diabetic Eye Exam: Completed 2019. Overdue for diabetic eye exam. Pt has been advised about the importance in completing this exam.   Diabetic Foot Exam: Completed OVERDUE. Pt has been advised about the importance in completing this exam.   Interpreter Needed?: No  Information entered by :: MJ Donnavan Covault, LPN   Activities of Daily Living In your present state of health, do you have any difficulty performing the following activities: 03/27/2021  Hearing? N  Vision? N  Difficulty concentrating or making decisions? Y  Walking or climbing stairs? Y  Dressing or bathing? N  Doing errands, shopping? Y  Preparing Food and eating ? N  Using the Toilet? N  In the past six months, have you accidently leaked urine? Y  Do you have problems with loss of bowel control? N  Managing your Medications? Y  Managing your Finances? Y  Housekeeping or managing your Housekeeping? Y  Some recent data might be hidden    Patient Care Team: [name], MD as PCP - General (Family Medicine) Edythe Clarity, [facility] as Pharmacist (Pharmacist)  Indicate any recent Medical Services you may have received from other than [facility] providers in the past year (date may be approximate).     Assessment:   This is a routine wellness examination for Briana Barr.  Hearing/Vision screen Hearing Screening - Comments:: Wears hearing aids.  Vision Screening - Comments:: Glasses. 12/30/2017. Declines eye exam.  Dietary issues and exercise activities discussed: Current Exercise Habits: Home exercise routine, Type of exercise: stretching (Chair and standing exercises.), Time  (Minutes): 20, Frequency (Times/Week): 5, Weekly Exercise (Minutes/Week): 100, Intensity: Mild, Exercise limited by: cardiac condition(s);respiratory conditions(s)   Goals Addressed             This Visit's Progress    Exercise 3x per week (30 min per  time)       Pt would like to continue to stay happy and healthy.       Depression Screen PHQ 2/9 Scores 03/27/2021 11/20/2020 01/12/2019 12/07/2017 12/07/2017 08/07/2016  PHQ - 2 Score 0 0 4 0 0 0  PHQ- 9 Score - - 14 - - 2    Fall Risk Fall Risk  03/27/2021 11/20/2020 01/12/2019 12/07/2017 12/07/2017  Falls in the past year? 0 0 0 No No  Number falls in past yr: 0 - - - -  Injury with Fall? 0 - - - -  Risk for fall due to : Impaired balance/gait;Impaired mobility;History of fall(s) - - - -  Follow up Falls prevention discussed - Falls evaluation completed - -    FALL RISK PREVENTION PERTAINING TO THE HOME:  Any stairs in or around the home? No  If so, are there any without handrails? No  Home free of loose throw rugs in walkways, pet beds, electrical cords, etc? Yes  Adequate lighting in your home to reduce risk of falls? Yes   ASSISTIVE DEVICES UTILIZED TO PREVENT FALLS:  Life alert? No  Use of a cane, walker or w/c? Yes  Grab bars in the bathroom? Yes  Shower chair or bench in shower? Yes  Elevated toilet seat or a handicapped toilet? Yes   TIMED UP AND GO:  Was the test performed? No .  Phone visit.  Cognitive Function:     6CIT Screen 03/27/2021  What Year? 0 points  What month? 0 points  What time? 0 points  Count back from 20 0 points  Months in reverse 4 points  Repeat phrase 10 points  Total Score 14    Immunizations Immunization History  Administered Date(s) Administered   Fluad Quad(high Dose 65+) 01/12/2019   Influenza,inj,Quad PF,6+ Mos 03/02/2016, 12/07/2017   PFIZER(Purple Top)SARS-COV-2 Vaccination 03/28/2020   Pneumococcal Conjugate-13 04/13/2014   Pneumococcal Polysaccharide-23 12/07/2017     TDAP status: Due, Education has been provided regarding the importance of this vaccine. Advised may receive this vaccine at local pharmacy or Health Dept. Aware to provide a copy of the vaccination record if obtained from local pharmacy or Health Dept. Verbalized acceptance and understanding.  Flu Vaccine status: Due, Education has been provided regarding the importance of this vaccine. Advised may receive this vaccine at local pharmacy or Health Dept. Aware to provide a copy of the vaccination record if obtained from local pharmacy or Health Dept. Verbalized acceptance and understanding.  Pneumococcal vaccine status: Up to date  Covid-19 vaccine status: Information provided on how to obtain vaccines.   Qualifies for Shingles Vaccine? Yes   Zostavax completed No   Shingrix Completed?: No.    Education has been provided regarding the importance of this vaccine. Patient has been advised to call insurance company to determine out of pocket expense if they have not yet received this vaccine. Advised may also receive vaccine at local pharmacy or Health Dept. Verbalized acceptance and understanding.  Screening Tests Health Maintenance  Topic Date Due   FOOT EXAM  Never done   Hepatitis C Screening  Never done   TETANUS/TDAP  Never done   Zoster Vaccines- Shingrix (1 of 2) Never done   OPHTHALMOLOGY EXAM  12/31/2018   COVID-19 Vaccine (2 - Pfizer series) 04/18/2020   INFLUENZA VACCINE  11/11/2020   HEMOGLOBIN A1C  06/10/2021   Pneumonia Vaccine 34+ Years old  Completed   DEXA SCAN  Completed   HPV VACCINES  Aged Out  Health Maintenance  Health Maintenance Due  Topic Date Due   FOOT EXAM  Never done   Hepatitis C Screening  Never done   TETANUS/TDAP  Never done   Zoster Vaccines- Shingrix (1 of 2) Never done   OPHTHALMOLOGY EXAM  12/31/2018   COVID-19 Vaccine (2 - Pfizer series) 04/18/2020   INFLUENZA VACCINE  11/11/2020    Colorectal cancer screening: Type of screening:  Colonoscopy. Completed 11/23/2016. Repeat every NO LONGER REQUIRED DUE TO AGE years  Mammogram status: No longer required due to AGE.  Bone Density status: Completed 10//29/2019. Results reflect: Bone density results: OSTEOPENIA. Repeat every 2 years.  Lung Cancer Screening: (Low Dose CT Chest recommended if Age 32-80 years, 30 pack-year currently smoking OR have quit w/in 15years.) does not qualify.    Additional Screening:  Hepatitis C Screening: does not qualify;   Vision Screening: Recommended annual ophthalmology exams for early detection of glaucoma and other disorders of the eye. Is the patient up to date with their annual eye exam?  No  Who is the provider or what is the name of the office in which the patient attends annual eye exams? 12/30/2017 If pt is not established with a provider, would they like to be referred to a provider to establish care? No .   Dental Screening: Recommended annual dental exams for proper oral hygiene  Community Resource Referral / Chronic Care Management: CRR required this visit?  No   CCM required this visit?  No      Plan:     I have personally reviewed and noted the following in the patients chart:   Medical and social history Use of alcohol, tobacco or illicit drugs  Current medications and supplements including opioid prescriptions.  Functional ability and status Nutritional status Physical activity Advanced directives List of other physicians Hospitalizations, surgeries, and ER visits in previous 12 months Vitals Screenings to include cognitive, depression, and falls Referrals and appointments  In addition, I have reviewed and discussed with patient certain preventive protocols, quality metrics, and best practice recommendations. A written personalized care plan for preventive services as well as general preventive health recommendations were provided to patient.     Chriss Driver, LPN   [id number]   Nurse Notes: Spoke  with pt and pt's daugther in law, Briana Barr. Pt states she is doing well. Currently under Palliative Care with [facility]. Discussed Shingrix, Flu and Covid vaccines and how to obtain. 6 CIT score of 14.

## 2021-03-27 NOTE — Patient Instructions (Signed)
Briana Barr , Thank you for taking time to come for your Medicare Wellness Visit. I appreciate your ongoing commitment to your health goals. Please review the following plan we discussed and let me know if I can assist you in the future.   Screening recommendations/referrals: Colonoscopy: Done 11/23/2016. No longer required due to age.  Mammogram: No longer required.  Bone Density: Done 02/08/2018. Recommended yearly ophthalmology/optometry visit for glaucoma screening and checkup Recommended yearly dental visit for hygiene and checkup  Vaccinations: Influenza vaccine: Due Repeat annually  Pneumococcal vaccine: Done 04/13/2014 and 12/07/2017 Tdap vaccine: Due Repeat in 10 years  Shingles vaccine: Shingrix discussed. Please contact your pharmacy for coverage information.     Covid-19:Done 03/28/2020. Please bring card to next visit, so other dates can be entered. Thank you!  Advanced directives: Copy in chart.  Conditions/risks identified: KEEP UP THE GOOD WORK!!  Next appointment: Follow up in one year for your annual wellness visit 2023.   Preventive Care 31 Years and Older, Female Preventive care refers to lifestyle choices and visits with your health care provider that can promote health and wellness. What does preventive care include? A yearly physical exam. This is also called an annual well check. Dental exams once or twice a year. Routine eye exams. Ask your health care provider how often you should have your eyes checked. Personal lifestyle choices, including: Daily care of your teeth and gums. Regular physical activity. Eating a healthy diet. Avoiding tobacco and drug use. Limiting alcohol use. Practicing safe sex. Taking low-dose aspirin every day. Taking vitamin and mineral supplements as recommended by your health care provider. What happens during an annual well check? The services and screenings done by your health care provider during your annual well check will  depend on your age, overall health, lifestyle risk factors, and family history of disease. Counseling  Your health care provider may ask you questions about your: Alcohol use. Tobacco use. Drug use. Emotional well-being. Home and relationship well-being. Sexual activity. Eating habits. History of falls. Memory and ability to understand (cognition). Work and work Statistician. Reproductive health. Screening  You may have the following tests or measurements: Height, weight, and BMI. Blood pressure. Lipid and cholesterol levels. These may be checked every 5 years, or more frequently if you are over 26 years old. Skin check. Lung cancer screening. You may have this screening every year starting at age 33 if you have a 30-pack-year history of smoking and currently smoke or have quit within the past 15 years. Fecal occult blood test (FOBT) of the stool. You may have this test every year starting at age 49. Flexible sigmoidoscopy or colonoscopy. You may have a sigmoidoscopy every 5 years or a colonoscopy every 10 years starting at age 80. Hepatitis C blood test. Hepatitis B blood test. Sexually transmitted disease (STD) testing. Diabetes screening. This is done by checking your blood sugar (glucose) after you have not eaten for a while (fasting). You may have this done every 1-3 years. Bone density scan. This is done to screen for osteoporosis. You may have this done starting at age 41. Mammogram. This may be done every 1-2 years. Talk to your health care provider about how often you should have regular mammograms. Talk with your health care provider about your test results, treatment options, and if necessary, the need for more tests. Vaccines  Your health care provider may recommend certain vaccines, such as: Influenza vaccine. This is recommended every year. Tetanus, diphtheria, and acellular pertussis (Tdap, Td)  vaccine. You may need a Td booster every 10 years. Zoster vaccine. You may  need this after age 10. Pneumococcal 13-valent conjugate (PCV13) vaccine. One dose is recommended after age 49. Pneumococcal polysaccharide (PPSV23) vaccine. One dose is recommended after age 20. Talk to your health care provider about which screenings and vaccines you need and how often you need them. This information is not intended to replace advice given to you by your health care provider. Make sure you discuss any questions you have with your health care provider. Document Released: 04/26/2015 Document Revised: 12/18/2015 Document Reviewed: 01/29/2015 Elsevier Interactive Patient Education  2017 [employer] Prevention in the Home Falls can cause injuries. They can happen to people of all ages. There are many things you can do to make your home safe and to help prevent falls. What can I do on the outside of my home? Regularly fix the edges of walkways and driveways and fix any cracks. Remove anything that might make you trip as you walk through a door, such as a raised step or threshold. Trim any bushes or trees on the path to your home. Use bright outdoor lighting. Clear any walking paths of anything that might make someone trip, such as rocks or tools. Regularly check to see if handrails are loose or broken. Make sure that both sides of any steps have handrails. Any raised decks and porches should have guardrails on the edges. Have any leaves, snow, or ice cleared regularly. Use sand or salt on walking paths during winter. Clean up any spills in your garage right away. This includes oil or grease spills. What can I do in the bathroom? Use night lights. Install grab bars by the toilet and in the tub and shower. Do not use towel bars as grab bars. Use non-skid mats or decals in the tub or shower. If you need to sit down in the shower, use a plastic, non-slip stool. Keep the floor dry. Clean up any water that spills on the floor as soon as it happens. Remove soap buildup in  the tub or shower regularly. Attach bath mats securely with double-sided non-slip rug tape. Do not have throw rugs and other things on the floor that can make you trip. What can I do in the bedroom? Use night lights. Make sure that you have a light by your bed that is easy to reach. Do not use any sheets or blankets that are too big for your bed. They should not hang down onto the floor. Have a firm chair that has side arms. You can use this for support while you get dressed. Do not have throw rugs and other things on the floor that can make you trip. What can I do in the kitchen? Clean up any spills right away. Avoid walking on wet floors. Keep items that you use a lot in easy-to-reach places. If you need to reach something above you, use a strong step stool that has a grab bar. Keep electrical cords out of the way. Do not use floor polish or wax that makes floors slippery. If you must use wax, use non-skid floor wax. Do not have throw rugs and other things on the floor that can make you trip. What can I do with my stairs? Do not leave any items on the stairs. Make sure that there are handrails on both sides of the stairs and use them. Fix handrails that are broken or loose. Make sure that handrails are as long  as the stairways. Check any carpeting to make sure that it is firmly attached to the stairs. Fix any carpet that is loose or worn. Avoid having throw rugs at the top or bottom of the stairs. If you do have throw rugs, attach them to the floor with carpet tape. Make sure that you have a light switch at the top of the stairs and the bottom of the stairs. If you do not have them, ask someone to add them for you. What else can I do to help prevent falls? Wear shoes that: Do not have high heels. Have rubber bottoms. Are comfortable and fit you well. Are closed at the toe. Do not wear sandals. If you use a stepladder: Make sure that it is fully opened. Do not climb a closed  stepladder. Make sure that both sides of the stepladder are locked into place. Ask someone to hold it for you, if possible. Clearly mark and make sure that you can see: Any grab bars or handrails. First and last steps. Where the edge of each step is. Use tools that help you move around (mobility aids) if they are needed. These include: Canes. Walkers. Scooters. Crutches. Turn on the lights when you go into a dark area. Replace any light bulbs as soon as they burn out. Set up your furniture so you have a clear path. Avoid moving your furniture around. If any of your floors are uneven, fix them. If there are any pets around you, be aware of where they are. Review your medicines with your doctor. Some medicines can make you feel dizzy. This can increase your chance of falling. Ask your doctor what other things that you can do to help prevent falls. This information is not intended to replace advice given to you by your health care provider. Make sure you discuss any questions you have with your health care provider. Document Released: 01/24/2009 Document Revised: 09/05/2015 Document Reviewed: 05/04/2014 Elsevier Interactive Patient Education  2017 [employer].

## 2021-04-15 ENCOUNTER — Telehealth: Payer: Self-pay | Admitting: *Deleted

## 2021-04-15 NOTE — Telephone Encounter (Signed)
Late Entry from 04/14/21 Received a communication from patient's daughter-in-law requesting a RN visit. Visit scheduled for 04/17/21 @ 10a.

## 2021-04-17 ENCOUNTER — Telehealth: Payer: Self-pay

## 2021-04-17 ENCOUNTER — Other Ambulatory Visit: Payer: Medicare Other | Admitting: *Deleted

## 2021-04-17 ENCOUNTER — Other Ambulatory Visit: Payer: Self-pay

## 2021-04-17 VITALS — BP 138/69 | HR 82 | Temp 98.6°F | Resp 18 | Wt 159.2 lb

## 2021-04-17 DIAGNOSIS — Z515 Encounter for palliative care: Secondary | ICD-10-CM

## 2021-04-17 NOTE — Telephone Encounter (Signed)
Pt's daughter called in stating that the Palliative care nurse [facility]) came out to see pt. Nurse told daughter that she heard a coarse crackling throughout in lower right lobe. Pt's daughter was inquiring if pt needs to seen or can something be called in for this to prevent pna. Please advise.  Cb#: [phone]

## 2021-04-17 NOTE — Progress Notes (Signed)
[facility] COMMUNITY PALLIATIVE CARE RN NOTE  PATIENT NAME: Briana Barr DOB: Dec 10, 1940 MRN: [id number]  PRIMARY CARE PROVIDER: [name], MD  RESPONSIBLE PARTY: [name] (daughter in law) Acct ID - Guarantor Home Phone Work Phone Relationship Acct Type  [id number] Briana Barr, Briana Barr [phone]  Self P/F     [address], [place], [place] [id number]   Covid-19 Pre-screening Negative  PLAN OF CARE and INTERVENTION:  ADVANCE CARE PLANNING/GOALS OF CARE: Goal is for patient to remain in her home and avoid hospitalizations. She has a DNR. PATIENT/CAREGIVER EDUCATION: Symptom management, safe mobility, s/s of infection DISEASE STATUS: RN visit made today as requested by patient's daughter in law [place]. Her son Shanon Brow and [name] are both present during visit today. Upon arrival, patient is sitting up in her recliner awake and alert. She has some forgetfulness and intermittent confusion, but able to answer most questions appropriately and make her needs known. She denies pain. [name] is concerned because on 04/12/22 while patient was at her house, she noticed patient with a very congested cough. Phlegm was very thick, but white in color. She started her on Mucinex twice daily the following day which she is still taking. Patient continues with a semi-productive cough. The amount of coughing spells have decreased but it still sounds very congested and she is not expectorating much. Coarse crackles noted scattered throughout all lung fields, more profound in the right lower lobe. No wheezing heard today. Patient denies shortness of breath and her oxygen level is 96-97% on 6L. Jesy advised that she is placing a call in to her PCP to discuss findings. [name] reports that last night patient took off her c pap mask twice stating that it is impeding her breathing, so after the 2nd removal Shanon Brow just left it off. She continues with a good appetite. Her weight is stable at 159.2 lbs today per son. She remains  ambulatory using her walker. Her blood sugar this morning was 161 and continues to stay below 200. Will continue to monitor.  HISTORY OF PRESENT ILLNESS:  This is a 81 yo female with a history of HTN, COPD and DM II. Palliative care team continues to follow patient for additional support, goals of care and complex decision making.        CODE STATUS: DNR ADVANCED DIRECTIVES: Y MOST FORM: no PPS: 50%   PHYSICAL EXAM:   VITALS: Today's Vitals   04/17/21 1025  BP: 138/69  Pulse: 82  Resp: 18  Temp: 98.6 F (37 C)  TempSrc: Temporal  SpO2: 97%  Weight: 159 lb 3.2 oz (72.2 kg)  PainSc: 0-No pain    LUNGS: coarse sounds heard, coarse crackles scattered throughout, more profound in RLL CARDIAC: Cor RRR EXTREMITIES: No edema SKIN:  Exposed skin is dry and intact; son reports continued darkness to sacral area but not open   NEURO:  Alert and oriented to person/place, forgetful, some intermittent confusion, generalized weakness, ambulatory w/walker   (Duration of visit and documentation 60 minutes)   Daryl Eastern, RN BSN

## 2021-04-17 NOTE — Telephone Encounter (Signed)
Appointment schedule for evaluation.  Family aware.

## 2021-04-18 ENCOUNTER — Ambulatory Visit (INDEPENDENT_AMBULATORY_CARE_PROVIDER_SITE_OTHER): Payer: Medicare Other | Admitting: Family Medicine

## 2021-04-18 ENCOUNTER — Ambulatory Visit
Admission: RE | Admit: 2021-04-18 | Discharge: 2021-04-18 | Disposition: A | Discharge status: Home / Self Care | Payer: Medicare Other | Source: Ambulatory Visit | Attending: Family Medicine | Admitting: Family Medicine

## 2021-04-18 ENCOUNTER — Encounter: Payer: Self-pay | Admitting: Family Medicine

## 2021-04-18 ENCOUNTER — Other Ambulatory Visit: Payer: Self-pay

## 2021-04-18 VITALS — BP 120/80 | HR 68 | Wt 163.0 lb

## 2021-04-18 DIAGNOSIS — R059 Cough, unspecified: Secondary | ICD-10-CM | POA: Diagnosis not present

## 2021-04-18 DIAGNOSIS — M5134 Other intervertebral disc degeneration, thoracic region: Secondary | ICD-10-CM | POA: Diagnosis not present

## 2021-04-18 DIAGNOSIS — J189 Pneumonia, unspecified organism: Secondary | ICD-10-CM | POA: Diagnosis not present

## 2021-04-18 LAB — POC COVID19 BINAXNOW: SARS Coronavirus 2 Ag: NEGATIVE

## 2021-04-18 MED ORDER — LEVOFLOXACIN 500 MG PO TABS: 500.0000 mg | ORAL_TABLET | Freq: Every day | ORAL | 0 refills | Status: AC

## 2021-04-18 NOTE — Progress Notes (Signed)
Subjective:    Patient ID: Briana Barr, female    DOB: July 24, 1940, 81 y.o.   MRN: [id number]  Patient is a very pleasant 81 year old Caucasian female who has end-stage COPD.  She is currently on 6 L of oxygen daily via nasal cannula.  Over the last week, the patient has had intermittent low-grade fever 99.3 and 99.4.  She is having worsening cough, audible chest congestion, rattling in her chest.  She has been wheezing more and having to use albuterol.  Family started her on Mucinex twice a day.  She has had increasing confusion.  Today on exam, she does not demonstrate respiratory distress.  She has baseline increased work of breathing but this is stable for this patient.  Her oxygen saturation is 98% on 6 L.  She denies any chest pain.  She denies any pleurisy.  She does have a cough productive of yellow mucus.  On examination, the patient has pronounced left basilar crackles which are asymmetric compared to the right side.  She also has diffuse expiratory wheezing.. Past Medical History:  Diagnosis Date   Barrett esophagus    Colon polyps    benign   COPD (chronic obstructive pulmonary disease) (HCC)    Duodenal ulcer    DVT (deep venous thrombosis) (HCC)    left leg x 2   Gallstones    GERD (gastroesophageal reflux disease)    Glaucoma    Hyperlipidemia    Ischemic colitis ([place])    Osteoporosis    Pneumonia    hx of x 2 as a child    Renal cyst, right    Retinopathy, background, proliferative    Past Surgical History:  Procedure Laterality Date   cervical vertebral fusion     c5-6, 6-7   CHOLECYSTECTOMY     COLONOSCOPY WITH PROPOFOL N/A 11/23/2016   Procedure: COLONOSCOPY WITH PROPOFOL;  Surgeon: Ladene Artist, MD;  Location: [facility];  Service: Endoscopy;  Laterality: N/A;   ESOPHAGOGASTRODUODENOSCOPY (EGD) WITH PROPOFOL N/A 11/23/2016   Procedure: ESOPHAGOGASTRODUODENOSCOPY (EGD) WITH PROPOFOL;  Surgeon: Ladene Artist, MD;  Location: [facility];  Service:  Endoscopy;  Laterality: N/A;   FEMORAL-POPLITEAL BYPASS GRAFT     IR RADIOLOGIST EVAL & MGMT  06/06/2019   IR RADIOLOGIST EVAL & MGMT  07/02/2020    Current Outpatient Medications on File Prior to Visit  Medication Sig Dispense Refill   acetaZOLAMIDE (DIAMOX) 250 MG tablet TAKE ONE TABLET BY MOUTH EVERY MORNING 90 tablet 3   albuterol (PROAIR HFA) 108 (90 Base) MCG/ACT inhaler Inhale 2 puffs into the lungs every 6 (six) hours as needed for wheezing or shortness of breath. 18 g 1   aspirin 81 MG tablet Take 81 mg by mouth daily.     Cholecalciferol (VITAMIN D3) 250 MCG (10000 UT) capsule Take 1 capsule (10,000 Units total) by mouth daily. 30 capsule    ELDERBERRY PO Take 1 each by mouth daily.     escitalopram (LEXAPRO) 10 MG tablet Take 1 tablet (10 mg total) by mouth daily. 90 tablet 3   Fluticasone-Umeclidin-Vilant (TRELEGY ELLIPTA) 100-62.5-25 MCG/ACT AEPB Inhale 1 Dose into the lungs daily. 1 each 11   glipiZIDE (GLUCOTROL XL) 5 MG 24 hr tablet Take 1 tablet (5 mg total) by mouth daily with breakfast. 30 tablet 3   HYDROcodone-acetaminophen (NORCO/VICODIN) 5-325 MG tablet Take 1 tablet by mouth 4 (four) times daily as needed for severe pain. 90 tablet 0   ipratropium-albuterol (DUONEB) 0.5-2.5 (3) MG/3ML SOLN Take  3 mLs by nebulization every 6 (six) hours as needed. 360 mL 1   losartan (COZAAR) 50 MG tablet Take 1 tablet (50 mg total) by mouth daily. 90 tablet 3   metFORMIN (GLUCOPHAGE) 1000 MG tablet Take 1 tablet (1,000 mg total) by mouth 2 (two) times daily with a meal. 60 tablet 3   Multiple Vitamins-Minerals (CENTRUM SILVER 50+WOMEN) TABS Take 1 tablet by mouth daily. 90 tablet    omeprazole (PRILOSEC) 20 MG capsule TAKE 1 CAPSULE BY MOUTH TWICE A DAY BEFORE A MEAL 180 capsule 3   rosuvastatin (CRESTOR) 20 MG tablet TAKE ONE TABLET BY MOUTH EVERY EVENING 90 tablet 3   sitaGLIPtin (JANUVIA) 100 MG tablet Take 1 tablet (100 mg total) by mouth daily. 90 tablet 3   No current  facility-administered medications on file prior to visit.   Allergies  Allergen Reactions   Vioxx [Rofecoxib] Swelling   Social History   Socioeconomic History   Marital status: Widowed    Spouse name: Not on file   Number of children: 3   Years of education: Not on file   Highest education level: Not on file  Occupational History   Occupation: retired  Tobacco Use   Smoking status: Every Day    Packs/day: 1.00    Types: Cigarettes   Smokeless tobacco: Never   Tobacco comments:    started age 50  Vaping Use   Vaping Use: Never used  Substance and Sexual Activity   Alcohol use: No   Drug use: No   Sexual activity: Not Currently  Other Topics Concern   Not on file  Social History Narrative   Not on file   Social Determinants of Health   Financial Resource Strain: Low Risk    Difficulty of Paying Living Expenses: Not hard at all  Food Insecurity: No Food Insecurity   Worried About Charity fundraiser in the Last Year: Never true   [place] in the Last Year: Never true  Transportation Needs: No Transportation Needs   Lack of Transportation (Medical): No   Lack of Transportation (Non-Medical): No  Physical Activity: Insufficiently Active   Days of Exercise per Week: 5 days   Minutes of Exercise per Session: 20 min  Stress: No Stress Concern Present   Feeling of Stress : Not at all  Social Connections: Moderately Integrated   Frequency of Communication with Friends and Family: More than three times a week   Frequency of Social Gatherings with Friends and Family: More than three times a week   Attends Religious Services: 1 to 4 times per year   Active Member of [employer] or Organizations: Yes   Attends Archivist Meetings: Never   Marital Status: Widowed  Human resources officer Violence: Not At Risk   Fear of Current or Ex-Partner: No   Emotionally Abused: No   Physically Abused: No   Sexually Abused: No   Family History  Problem Relation Age of Onset    Other Father        some sort of blood cancer-had to get transfusions   Stomach cancer Maternal Grandmother    Stomach cancer Maternal Grandfather       Review of Systems  All other systems reviewed and are negative.     Objective:   Physical Exam Vitals reviewed.  Constitutional:      General: She is not in acute distress.    Appearance: She is well-developed. She is not diaphoretic.  HENT:  Head: Normocephalic and atraumatic.     Right Ear: External ear normal.     Left Ear: External ear normal.     Nose: Nose normal.     Mouth/Throat:     Pharynx: No oropharyngeal exudate.  Eyes:     General: No scleral icterus.       Right eye: No discharge.        Left eye: No discharge.     Conjunctiva/sclera: Conjunctivae normal.  Neck:     Thyroid: No thyromegaly.     Vascular: No JVD.  Cardiovascular:     Rate and Rhythm: Normal rate and regular rhythm.     Heart sounds: Normal heart sounds. No murmur heard.   No friction rub. No gallop.  Pulmonary:     Effort: Pulmonary effort is normal.     Breath sounds: Decreased air movement present. Examination of the left-lower field reveals rales. Decreased breath sounds, wheezing and rales present.    Abdominal:     General: Bowel sounds are normal. There is no distension.     Palpations: Abdomen is soft. There is no mass.     Tenderness: There is no abdominal tenderness. There is no guarding or rebound.  Musculoskeletal:     Cervical back: Neck supple.  Lymphadenopathy:     Cervical: No cervical adenopathy.  Skin:    General: Skin is warm.     Coloration: Skin is not pale.     Findings: No erythema or rash.  Neurological:     Mental Status: She is alert and oriented to person, place, and time.     Cranial Nerves: No cranial nerve deficit.     Motor: No abnormal muscle tone.     Coordination: Coordination normal.     Deep Tendon Reflexes: Reflexes are normal and symmetric.  Psychiatric:        Behavior: Behavior  normal.        Thought Content: Thought content normal.        Judgment: Judgment normal.          Assessment & Plan:  Community acquired pneumonia of left lower lobe of lung Exam is concerning for community-acquired pneumonia.  I will screen the patient for COVID.  Also obtain a chest x-ray.  Meanwhile begin Levaquin for empiric coverage of community-acquired pneumonia in a patient with end-stage COPD.

## 2021-04-18 NOTE — Addendum Note (Signed)
Addended by: Elta Guadeloupe on: 04/18/2021 11:49 AM   Modules accepted: Orders

## 2021-04-21 ENCOUNTER — Telehealth: Payer: Self-pay

## 2021-04-21 NOTE — Telephone Encounter (Signed)
Patient aware of results.

## 2021-04-21 NOTE — Telephone Encounter (Signed)
-----   Message from [name], MD sent at 04/21/2021  7:07 AM EST ----- Chest x-ray shows no pneumonia however I would treat her based on her clinical symptoms

## 2021-04-22 ENCOUNTER — Other Ambulatory Visit: Payer: Self-pay | Admitting: Family Medicine

## 2021-04-22 ENCOUNTER — Telehealth: Payer: Self-pay | Admitting: Pharmacist

## 2021-04-22 NOTE — Chronic Care Management (AMB) (Addendum)
Chronic Care Management Pharmacy Assistant   Name: Lanyia Mestre  MRN: [id number] DOB: 19-May-1940   Reason for Encounter: Medication Coordination Call    Recent office visits:  04/18/2020 OV (PCP) [name], MD; no medication changes indicated.  Recent consult visits:  None  Hospital visits:  None in previous 6 months  Medications: Outpatient Encounter Medications as of 04/22/2021  Medication Sig   acetaZOLAMIDE (DIAMOX) 250 MG tablet TAKE ONE TABLET BY MOUTH EVERY MORNING   albuterol (PROAIR HFA) 108 (90 Base) MCG/ACT inhaler Inhale 2 puffs into the lungs every 6 (six) hours as needed for wheezing or shortness of breath.   aspirin 81 MG tablet Take 81 mg by mouth daily.   Cholecalciferol (VITAMIN D3) 250 MCG (10000 UT) capsule Take 1 capsule (10,000 Units total) by mouth daily.   ELDERBERRY PO Take 1 each by mouth daily.   escitalopram (LEXAPRO) 10 MG tablet Take 1 tablet (10 mg total) by mouth daily.   Fluticasone-Umeclidin-Vilant (TRELEGY ELLIPTA) 100-62.5-25 MCG/ACT AEPB Inhale 1 Dose into the lungs daily.   glipiZIDE (GLUCOTROL XL) 5 MG 24 hr tablet Take 1 tablet (5 mg total) by mouth daily with breakfast.   HYDROcodone-acetaminophen (NORCO/VICODIN) 5-325 MG tablet TAKE ONE TABLET BY MOUTH four times daily AS NEEDED FOR SEVERE pain   ipratropium-albuterol (DUONEB) 0.5-2.5 (3) MG/3ML SOLN Take 3 mLs by nebulization every 6 (six) hours as needed.   levofloxacin (LEVAQUIN) 500 MG tablet Take 1 tablet (500 mg total) by mouth daily for 7 days.   losartan (COZAAR) 50 MG tablet Take 1 tablet (50 mg total) by mouth daily.   metFORMIN (GLUCOPHAGE) 1000 MG tablet TAKE ONE TABLET BY MOUTH TWICE DAILY WITH FOOD   Multiple Vitamins-Minerals (CENTRUM SILVER 50+WOMEN) TABS Take 1 tablet by mouth daily.   omeprazole (PRILOSEC) 20 MG capsule TAKE 1 CAPSULE BY MOUTH TWICE A DAY BEFORE A MEAL   rosuvastatin (CRESTOR) 20 MG tablet TAKE ONE TABLET BY MOUTH EVERY EVENING   sitaGLIPtin  (JANUVIA) 100 MG tablet Take 1 tablet (100 mg total) by mouth daily.   No facility-administered encounter medications on file as of 04/22/2021.   Reviewed chart for medication changes ahead of medication coordination call.  BP Readings from Last 3 Encounters:  04/18/21 120/80  04/17/21 138/69  03/25/21 110/65    Lab Results  Component Value Date   HGBA1C 7.7 (H) 12/09/2020     Patient obtains medications through Adherence Packaging  30 Days   Last adherence delivery included:  Acetazolamide 250 mg one tablet at breakfast Glipizide ER 5 mg one tablet at breakfast Metformin 100 mg one at evening meal Rosuvastatin 20 mg one at evening meal Escitalopram 10 mg one at breakfast Losartan Potassium 50 mg one at breakfast Omeprazole 20 mg one at breakfast, one at evening meal Januvia 100 mg one at breakfast Trelegy 100 mcg one puff daily Hydrocodone-APAP 5-325 mg q4hrs prn  Patient is due for next adherence delivery on: 05/02/2021. Called patient and reviewed medications and coordinated delivery.  This delivery to include: Acetazolamide 250 mg one tablet at breakfast Glipizide ER 5 mg one tablet at breakfast Metformin 100 mg one at evening meal Rosuvastatin 20 mg one at evening meal Escitalopram 10 mg one at breakfast Losartan Potassium 50 mg one at breakfast Omeprazole 20 mg one at breakfast, one at evening meal Januvia 100 mg one at breakfast Trelegy 100 mcg one puff daily Hydrocodone-APAP 5-325 mg q4hrs prn  Patient needs refills for Metformin and Hydrocodone.  I sent a message to [facility] requesting said refills.  Confirmed delivery date of 05/02/2021, advised patient that pharmacy will contact them the morning of delivery.   Care Gaps: Medicare Annual Wellness: Completed 03/24/2021 Ophthalmology Exam: Overdue since 12/31/2018 Foot Exam: Overdue - never done Hemoglobin A1C: 7.7% on 12/09/2020 Colonoscopy: Aged out, last completed 11/23/2016 Dexa Scan: Completed Mammogram: Aged  out  Future Appointments  Date Time Provider [place]  04/02/2022 10:30 AM [facility] HEALTH ADVISOR [facility] None   April D Calhoun, [place] Pharmacist Assistant [phone]   10 minutes spent in review, coordination, and documentation.  Reviewed by: Beverly Milch, PharmD Clinical Pharmacist [phone]

## 2021-04-29 DIAGNOSIS — Z20822 Contact with and (suspected) exposure to covid-19: Secondary | ICD-10-CM | POA: Diagnosis not present

## 2021-05-05 ENCOUNTER — Other Ambulatory Visit: Payer: Medicare Other | Admitting: *Deleted

## 2021-05-05 ENCOUNTER — Other Ambulatory Visit: Payer: Self-pay

## 2021-05-05 DIAGNOSIS — Z515 Encounter for palliative care: Secondary | ICD-10-CM

## 2021-05-05 NOTE — Progress Notes (Signed)
[facility] COMMUNITY PALLIATIVE CARE RN NOTE  PATIENT NAME: Briana Barr DOB: 04-01-1941 MRN: [id number]  PRIMARY CARE PROVIDER: [name], MD  RESPONSIBLE PARTY: [name] (daughter-in-law) Acct ID - Guarantor Home Phone Work Phone Relationship Acct Type  [id number] AIRIEL, OBLINGER [phone]  Self P/F     [address] [place], [place], [place] [address]   Due to the COVID-19 crisis, this virtual check-in visit was done via telephone from my office and it was initiated and consent by this patient and or family.  RN telephonic palliative care encounter completed with patient's daughter-in-law, [name]. On 04/18/2021 patient was prescribed Levaquin for suspected community-acquired pneumonia. Patient has completed this course of antibiotics and her condition is stable per Jesy. She continues with a semi-productive chronic cough (phlegm is white), but it no longer sounds congested. She remains on oxygen at 6L/min via nasal cannula and a c-pap at night. No complaints of shortness of breath. She is back to her baseline. Her pain is well controlled with Hydrocodone. She remains ambulatory using her walker. Her blood sugars have remained below 200. No unmanaged issues reported. Will continue to monitor.  (Duration of visit and documentation 20 minutes)    Daryl Eastern, RN BSN

## 2021-05-13 ENCOUNTER — Other Ambulatory Visit: Payer: Self-pay

## 2021-05-13 ENCOUNTER — Other Ambulatory Visit: Payer: Medicare Other | Admitting: *Deleted

## 2021-05-13 VITALS — BP 126/65 | HR 81 | Temp 98.4°F | Resp 18 | Wt 160.4 lb

## 2021-05-13 DIAGNOSIS — Z515 Encounter for palliative care: Secondary | ICD-10-CM

## 2021-05-13 NOTE — Progress Notes (Signed)
[facility] COMMUNITY PALLIATIVE CARE RN NOTE  PATIENT NAME: Briana Barr DOB: 04-15-1940 MRN: [id number]  PRIMARY CARE PROVIDER: [name], MD  RESPONSIBLE PARTY: [name] (daughter-in-law) Acct ID - Guarantor Home Phone Work Phone Relationship Acct Type  [id number] ADJOA, ALTHOUSE [phone]  Self P/F     [place], [place], [place] [id number]   Covid-19 Pre-screening Negative  PLAN OF CARE and INTERVENTION:  ADVANCE CARE PLANNING/GOALS OF CARE: Goal is for patient to remain in her home and avoid hospitalizations. She has a DNR. PATIENT/CAREGIVER EDUCATION: Symptom management, skin breakdown prevention, safe mobility/transfers DISEASE STATUS: RN follow up palliative care visit completed with patient, two sons and daughter in law. Patient completed course of antibiotics earlier this month due to suspected community acquired pneumonia. She continues with a productive congested cough, however it has improved. Phlegm is clear. She continues with Albuterol nebulizer each morning. Expiratory wheezing auscultated in bilateral lung fields, however no more coarse crackles heard. Denies pain or shortness of breath. Remains on 6L/min via [place] continuously and wears a cpap at night. Her appetite remains good. Current weight 160.4 lbs. Blood sugars remain under 200. Assessed her bottom. Area of dark purple appearance has decreased significantly in size. She does have 2 small dime-sized chaffed areas noted to each buttocks side by side. Recommended Vaseline, A&D ointment or Desitin for a protective barrier to prevent breakdown. Will continue to monitor.  HISTORY OF PRESENT ILLNESS: This is a 81 yo female with a history of HTN, COPD and DM II. She is currently Covid positive. Palliative care team continues to follow patient for additional support, goals of care and complex decision making.         CODE STATUS: DNR ADVANCED DIRECTIVES: Y MOST FORM: no PPS: 50%   PHYSICAL EXAM:    VITALS: Today's Vitals   05/13/21 1031  BP: 126/65  Pulse: 81  Resp: 18  Temp: 98.4 F (36.9 C)  TempSrc: Temporal  SpO2: 94%  Weight: 160 lb 6.4 oz (72.8 kg)  PainSc: 0-No pain    LUNGS: expiratory wheezes bilaterally CARDIAC: Cor RRR EXTREMITIES: No edema SKIN:  See note above   NEURO:  Alert and oriented x 3, intermittent confusion, forgetful, generalized weakness, ambulatory w/walker   (Duration of visit and documentation 60 minutes)   Daryl Eastern, RN BSN

## 2021-05-16 DIAGNOSIS — Z20822 Contact with and (suspected) exposure to covid-19: Secondary | ICD-10-CM | POA: Diagnosis not present

## 2021-05-23 ENCOUNTER — Other Ambulatory Visit: Payer: Self-pay

## 2021-05-23 ENCOUNTER — Telehealth: Payer: Self-pay | Admitting: Pharmacist

## 2021-05-23 MED ORDER — HYDROCODONE-ACETAMINOPHEN 5-325 MG PO TABS: ORAL_TABLET | ORAL | 0 refills | Status: DC

## 2021-05-23 NOTE — Telephone Encounter (Signed)
LOV 04/18/21 Last refill 04/22/21, #90, 0 refills  Please review, thanks!

## 2021-05-23 NOTE — Progress Notes (Addendum)
Chronic Care Management Pharmacy Assistant   Name: Briana Barr  MRN: [id number] DOB: 1940/08/07   Reason for Encounter: Medication Coordination Call   Recent office visits:  None  Recent consult visits:  None  Hospital visits:  None in previous 6 months  Medications: Outpatient Encounter Medications as of 05/23/2021  Medication Sig   acetaZOLAMIDE (DIAMOX) 250 MG tablet TAKE ONE TABLET BY MOUTH EVERY MORNING   albuterol (PROAIR HFA) 108 (90 Base) MCG/ACT inhaler Inhale 2 puffs into the lungs every 6 (six) hours as needed for wheezing or shortness of breath.   aspirin 81 MG tablet Take 81 mg by mouth daily.   Cholecalciferol (VITAMIN D3) 250 MCG (10000 UT) capsule Take 1 capsule (10,000 Units total) by mouth daily.   ELDERBERRY PO Take 1 each by mouth daily.   escitalopram (LEXAPRO) 10 MG tablet Take 1 tablet (10 mg total) by mouth daily.   Fluticasone-Umeclidin-Vilant (TRELEGY ELLIPTA) 100-62.5-25 MCG/ACT AEPB Inhale 1 Dose into the lungs daily.   glipiZIDE (GLUCOTROL XL) 5 MG 24 hr tablet Take 1 tablet (5 mg total) by mouth daily with breakfast.   HYDROcodone-acetaminophen (NORCO/VICODIN) 5-325 MG tablet TAKE ONE TABLET BY MOUTH four times daily AS NEEDED FOR SEVERE pain   ipratropium-albuterol (DUONEB) 0.5-2.5 (3) MG/3ML SOLN Take 3 mLs by nebulization every 6 (six) hours as needed.   losartan (COZAAR) 50 MG tablet Take 1 tablet (50 mg total) by mouth daily.   metFORMIN (GLUCOPHAGE) 1000 MG tablet TAKE ONE TABLET BY MOUTH TWICE DAILY WITH FOOD   Multiple Vitamins-Minerals (CENTRUM SILVER 50+WOMEN) TABS Take 1 tablet by mouth daily.   omeprazole (PRILOSEC) 20 MG capsule TAKE 1 CAPSULE BY MOUTH TWICE A DAY BEFORE A MEAL   rosuvastatin (CRESTOR) 20 MG tablet TAKE ONE TABLET BY MOUTH EVERY EVENING   sitaGLIPtin (JANUVIA) 100 MG tablet Take 1 tablet (100 mg total) by mouth daily.   No facility-administered encounter medications on file as of 05/23/2021.   Reviewed chart for  medication changes ahead of medication coordination call.  No OVs, Consults, or hospital visits since last care coordination call/Pharmacist visit.   No medication changes indicated.  BP Readings from Last 3 Encounters:  05/13/21 126/65  04/18/21 120/80  04/17/21 138/69    Lab Results  Component Value Date   HGBA1C 7.7 (H) 12/09/2020     Patient obtains medications through Vials  30 Days   Last adherence delivery included:  Acetazolamide 250 mg one tablet at breakfast Glipizide ER 5 mg one tablet at breakfast Metformin 100 mg one at evening meal Rosuvastatin 20 mg one at evening meal Escitalopram 10 mg one at breakfast Losartan Potassium 50 mg one at breakfast Omeprazole 20 mg one at breakfast, one at evening meal Januvia 100 mg one at breakfast Trelegy 100 mcg one puff daily Hydrocodone-APAP 5-325 mg q4hrs prn  Patient is due for next adherence delivery on: 06/03/2021. Called patient and reviewed medications and coordinated delivery.  This delivery to include: Acetazolamide 250 mg one tablet at breakfast Glipizide ER 5 mg one tablet at breakfast Metformin 100 mg one at evening meal Rosuvastatin 20 mg one at evening meal Escitalopram 10 mg one at breakfast Losartan Potassium 50 mg one at breakfast Omeprazole 20 mg one at breakfast, one at evening meal Januvia 100 mg one at breakfast Trelegy 100 mcg one puff daily Hydrocodone-APAP 5-325 mg q4hrs prn  Patient needs refills for Hydrocodone. -I sent a refill request to [facility].  Confirmed delivery date of 06/03/2021, advised  patient that pharmacy will contact them the morning of delivery.   Care Gaps: Medicare Annual Wellness: Completed 03/24/2021 Ophthalmology Exam: Overdue since 12/31/2018 Foot Exam: Overdue - never done Hemoglobin A1C: 7.7% on 12/09/2020 Colonoscopy: Aged out, last completed 11/23/2016 Dexa Scan: Completed Mammogram: Aged out  Future Appointments  Date Time Provider [place]  04/02/2022  10:30 AM [facility] HEALTH ADVISOR [facility] None    April D Calhoun, [employer] Pharmacist Assistant [phone]   10 minutes spent in review, coordination, and documentation.  Reviewed by: Beverly Milch, PharmD Clinical Pharmacist [phone]

## 2021-05-27 ENCOUNTER — Telehealth: Payer: Self-pay

## 2021-05-27 DIAGNOSIS — Z515 Encounter for palliative care: Secondary | ICD-10-CM

## 2021-05-27 NOTE — Telephone Encounter (Signed)
(  3:15 pm) PC SW completed a follow-up call to patient's PCG-/DIL-Jesy to provide education and resources regarding caregiver support groups([facility], [place] [facility] and [employer]) and respite care (private pay, facilities). Jesy asked about hospice care and difference in hospice and palliative care-education provided. Jesy verbalized appreciation for the call. SW encouraged her to call with any additional questions or concerns.  Palliative care RN updated for follow-up.

## 2021-05-30 ENCOUNTER — Other Ambulatory Visit: Payer: Self-pay

## 2021-05-30 DIAGNOSIS — J441 Chronic obstructive pulmonary disease with (acute) exacerbation: Secondary | ICD-10-CM

## 2021-05-30 MED ORDER — IPRATROPIUM-ALBUTEROL 0.5-2.5 (3) MG/3ML IN SOLN: 3.0000 mL | Freq: Four times a day (QID) | RESPIRATORY_TRACT | 1 refills | Status: DC | PRN

## 2021-06-06 ENCOUNTER — Other Ambulatory Visit: Payer: Self-pay | Admitting: Interventional Radiology

## 2021-06-06 DIAGNOSIS — I739 Peripheral vascular disease, unspecified: Secondary | ICD-10-CM

## 2021-06-20 ENCOUNTER — Telehealth: Payer: Self-pay | Admitting: Pharmacist

## 2021-06-20 ENCOUNTER — Other Ambulatory Visit: Payer: Self-pay

## 2021-06-20 MED ORDER — HYDROCODONE-ACETAMINOPHEN 5-325 MG PO TABS: ORAL_TABLET | ORAL | 0 refills | Status: DC

## 2021-06-20 NOTE — Progress Notes (Signed)
? ? ?Chronic Care Management ?Pharmacy Assistant  ? ?Name: Briana Barr  MRN: [id number] DOB: April 10, 1941 ? ? ?Reason for Encounter: Medication Coordination Call ?  ? ?Recent office visits:  ?None ? ?Recent consult visits:  ?None ? ?Hospital visits:  ?None in previous 6 months ? ?Medications: ?Outpatient Encounter Medications as of 06/20/2021  ?Medication Sig  ? acetaZOLAMIDE (DIAMOX) 250 MG tablet TAKE ONE TABLET BY MOUTH EVERY MORNING  ? albuterol (PROAIR HFA) 108 (90 Base) MCG/ACT inhaler Inhale 2 puffs into the lungs every 6 (six) hours as needed for wheezing or shortness of breath.  ? aspirin 81 MG tablet Take 81 mg by mouth daily.  ? Cholecalciferol (VITAMIN D3) 250 MCG (10000 UT) capsule Take 1 capsule (10,000 Units total) by mouth daily.  ? ELDERBERRY PO Take 1 each by mouth daily.  ? escitalopram (LEXAPRO) 10 MG tablet Take 1 tablet (10 mg total) by mouth daily.  ? Fluticasone-Umeclidin-Vilant (TRELEGY ELLIPTA) 100-62.5-25 MCG/ACT AEPB Inhale 1 Dose into the lungs daily.  ? glipiZIDE (GLUCOTROL XL) 5 MG 24 hr tablet Take 1 tablet (5 mg total) by mouth daily with breakfast.  ? HYDROcodone-acetaminophen (NORCO/VICODIN) 5-325 MG tablet TAKE ONE TABLET BY MOUTH four times daily AS NEEDED FOR SEVERE pain  ? ipratropium-albuterol (DUONEB) 0.5-2.5 (3) MG/3ML SOLN Take 3 mLs by nebulization every 6 (six) hours as needed.  ? losartan (COZAAR) 50 MG tablet Take 1 tablet (50 mg total) by mouth daily.  ? metFORMIN (GLUCOPHAGE) 1000 MG tablet TAKE ONE TABLET BY MOUTH TWICE DAILY WITH FOOD  ? Multiple Vitamins-Minerals (CENTRUM SILVER 50+WOMEN) TABS Take 1 tablet by mouth daily.  ? omeprazole (PRILOSEC) 20 MG capsule TAKE 1 CAPSULE BY MOUTH TWICE A DAY BEFORE A MEAL  ? rosuvastatin (CRESTOR) 20 MG tablet TAKE ONE TABLET BY MOUTH EVERY EVENING  ? sitaGLIPtin (JANUVIA) 100 MG tablet Take 1 tablet (100 mg total) by mouth daily.  ? ?No facility-administered encounter medications on file as of 06/20/2021.  ? ?Reviewed chart for  medication changes ahead of medication coordination call. ? ?No OVs, Consults, or hospital visits since last care coordination call/Pharmacist visit.  ? ?No medication changes indicated. ? ?BP Readings from Last 3 Encounters:  ?05/13/21 126/65  ?04/18/21 120/80  ?04/17/21 138/69  ?  ?Lab Results  ?Component Value Date  ? HGBA1C 7.7 (H) 12/09/2020  ?  ? ?Patient obtains medications through Adherence Packaging  30 Days  ? ?Last adherence delivery included:  ?Acetazolamide 250 mg one tablet at breakfast ?Glipizide ER 5 mg one tablet at breakfast ?Metformin 100 mg one at evening meal ?Rosuvastatin 20 mg one at evening meal ?Escitalopram 10 mg one at breakfast ?Losartan Potassium 50 mg one at breakfast ?Omeprazole 20 mg one at breakfast, one at evening meal ?Januvia 100 mg one at breakfast ?Trelegy 100 mcg one puff daily ?Hydrocodone-APAP 5-325 mg q4hrs prn ? ? ?Patient is due for next adherence delivery on: 07/02/2021. ?Called patient and reviewed medications and coordinated delivery. ? ?This delivery to include: ?Acetazolamide 250 mg one tablet at breakfast ?Glipizide ER 5 mg one tablet at breakfast ?Metformin 100 mg one at evening meal ?Rosuvastatin 20 mg one at evening meal ?Escitalopram 10 mg one at breakfast ?Losartan Potassium 50 mg one at breakfast ?Omeprazole 20 mg one at breakfast, one at evening meal ?Januvia 100 mg one at breakfast ?Trelegy 100 mcg one puff daily ?Hydrocodone-APAP 5-325 mg q4hrs prn ? ? ?Patient needs refills for Hydrocodone. ?-Message sent to [facility] refill request pool. ? ?Confirmed delivery  date of 07/02/2021, advised patient that pharmacy will contact them the morning of delivery.  ? ?Care Gaps: ?Medicare Annual Wellness: Completed 03/24/2021 ?Ophthalmology Exam: Overdue since 12/31/2018 ?Foot Exam: Overdue - never done ?Hemoglobin A1C: 7.7% on 12/09/2020 ?Colonoscopy: Aged out, last completed 11/23/2016 ?Dexa Scan: Completed ?Mammogram: Aged out ? ?Future Appointments  ?Date Time Provider  [place]  ?07/08/2021 10:00 AM [facility] [place] [facility]  ?07/08/2021 11:00 AM [facility] IR [facility]  ?04/02/2022 10:30 AM [facility] HEALTH ADVISOR [facility]  ? ? ?April D Calhoun, [place] ?Clinical Pharmacist Assistant ?[phone]  ?

## 2021-06-20 NOTE — Telephone Encounter (Signed)
LOV 04/18/21 ?Last refill 05/23/21, #90, 0 refills ? ?Please review, thanks! ? ?

## 2021-07-01 MED ORDER — HYDROCODONE-ACETAMINOPHEN 5-325 MG PO TABS: ORAL_TABLET | ORAL | 0 refills | Status: DC

## 2021-07-01 NOTE — Telephone Encounter (Signed)
Message received from [facility], [facility], that [facility] does not have [facility] in [place].  ? ?Please send to [facility], thanks! ?

## 2021-07-01 NOTE — Addendum Note (Signed)
Addended by: Wadie Lessen on: 07/01/2021 04:08 PM ? ? Modules accepted: Orders ? ?

## 2021-07-02 DIAGNOSIS — Z20822 Contact with and (suspected) exposure to covid-19: Secondary | ICD-10-CM | POA: Diagnosis not present

## 2021-07-08 ENCOUNTER — Ambulatory Visit
Admission: RE | Admit: 2021-07-08 | Discharge: 2021-07-08 | Disposition: A | Discharge status: Home / Self Care | Payer: Medicare Other | Source: Ambulatory Visit | Attending: Interventional Radiology | Admitting: Interventional Radiology

## 2021-07-08 DIAGNOSIS — I743 Embolism and thrombosis of arteries of the lower extremities: Secondary | ICD-10-CM | POA: Diagnosis not present

## 2021-07-08 DIAGNOSIS — I739 Peripheral vascular disease, unspecified: Secondary | ICD-10-CM

## 2021-07-08 HISTORY — PX: IR RADIOLOGIST EVAL & MGMT: IMG5224

## 2021-07-08 NOTE — Progress Notes (Signed)
? ? ?Chief Complaint: ?Barr ?  ?Referring Physician(s): ?Dr. Boneta Lucks ?  ?History of Present Illness: ?Briana Barr is a 81 y.o. female presenting today as a scheduled follow up to [facility] with known Barr history.  ?  ?Briana Barr joins [place] today with her daughter in the clinic.    ?  ?Hx: ?We have been following Briana Barr.  ?  ?Since our last visit, she denies any new symptoms.  ? ?I cannon elicit any history of claudication.  She report that she does not walk very much, using a walker for mobility.  ? ?I cannot elicit any history of resting pain or night pain.  ?  ?She denies any chest pain.  ? ?She reports she has had success cutting her smoking back to about "1 cigarette per month." Which is much less than last time we saw her.  ? ?She continue on home O2.   ?  ?Non-invasive today is essentially unchanged. ?Right ABI: 0.60, with segmental exam showing likely fem-pop occlusion, and early iliac disease ?Left ABI: 0.89.  ?  ?She has history of prior left fem-pop bypass, unknown location. Her daughter speculates it was [place] ? ?Past Medical History:  ?Diagnosis Date  ? Barrett esophagus   ? Colon polyps   ? benign  ? COPD (chronic obstructive pulmonary disease) ([place])   ? Duodenal ulcer   ? DVT (deep venous thrombosis) ([place])   ? left leg x 2  ? Gallstones   ? GERD (gastroesophageal reflux disease)   ? Glaucoma   ? Hyperlipidemia   ? Ischemic colitis ([place])   ? Osteoporosis   ? Pneumonia   ? hx of x 2 as a child   ? Renal cyst, right   ? Retinopathy, background, proliferative   ? ? ?Past Surgical History:  ?Procedure Laterality Date  ? cervical vertebral fusion    ? c5-6, 6-7  ? CHOLECYSTECTOMY    ? COLONOSCOPY WITH PROPOFOL N/A 11/23/2016  ? Procedure: COLONOSCOPY WITH PROPOFOL;  Surgeon: Ladene Artist, MD;  Location: [facility];  Service: Endoscopy;  Laterality: N/A;  ? ESOPHAGOGASTRODUODENOSCOPY (EGD) WITH PROPOFOL N/A 11/23/2016  ? Procedure: ESOPHAGOGASTRODUODENOSCOPY (EGD) WITH  PROPOFOL;  Surgeon: Ladene Artist, MD;  Location: [facility];  Service: Endoscopy;  Laterality: N/A;  ? FEMORAL-POPLITEAL BYPASS GRAFT    ? IR RADIOLOGIST EVAL & MGMT  06/06/2019  ? IR RADIOLOGIST EVAL & MGMT  07/02/2020  ? ? ?Allergies: ?Vioxx [rofecoxib] ? ?Medications: ?Prior to Admission medications   ?Medication Sig Start Date End Date Taking? Authorizing Provider  ?acetaZOLAMIDE (DIAMOX) 250 MG tablet TAKE ONE TABLET BY MOUTH EVERY MORNING 03/27/21   [name], MD  ?albuterol (PROAIR HFA) 108 (90 Base) MCG/ACT inhaler Inhale 2 puffs into the lungs every 6 (six) hours as needed for wheezing or shortness of breath. 03/06/19   Alycia Rossetti, MD  ?aspirin 81 MG tablet Take 81 mg by mouth daily.    [provider]  ?Cholecalciferol (VITAMIN D3) 250 MCG (10000 UT) capsule Take 1 capsule (10,000 Units total) by mouth daily. 01/20/19   [name], MD  ?ELDERBERRY PO Take 1 each by mouth daily.    [provider]  ?escitalopram (LEXAPRO) 10 MG tablet Take 1 tablet (10 mg total) by mouth daily. 01/22/21   [name], MD  ?Fluticasone-Umeclidin-Vilant (TRELEGY ELLIPTA) 100-62.5-25 MCG/ACT AEPB Inhale 1 Dose into the lungs daily. 03/24/21   [name], MD  ?glipiZIDE (  GLUCOTROL XL) 5 MG 24 hr tablet Take 1 tablet (5 mg total) by mouth daily with breakfast. 12/24/20   [name], MD  ?HYDROcodone-acetaminophen (NORCO/VICODIN) 5-325 MG tablet TAKE ONE TABLET BY MOUTH four times daily AS NEEDED FOR SEVERE pain 07/01/21   [name], MD  ?ipratropium-albuterol (DUONEB) 0.5-2.5 (3) MG/3ML SOLN Take 3 mLs by nebulization every 6 (six) hours as needed. 05/30/21   [name], MD  ?losartan (COZAAR) 50 MG tablet Take 1 tablet (50 mg total) by mouth daily. 02/19/21   [name], MD  ?metFORMIN (GLUCOPHAGE) 1000 MG tablet TAKE ONE TABLET BY MOUTH TWICE DAILY WITH FOOD 04/22/21   [name], MD  ?Multiple Vitamins-Minerals (CENTRUM SILVER 50+WOMEN)  TABS Take 1 tablet by mouth daily. 01/20/19   [name], MD  ?omeprazole (PRILOSEC) 20 MG capsule TAKE 1 CAPSULE BY MOUTH TWICE A DAY BEFORE A MEAL 02/19/21   [name], MD  ?rosuvastatin (CRESTOR) 20 MG tablet TAKE ONE TABLET BY MOUTH EVERY EVENING 03/27/21   [name], MD  ?sitaGLIPtin (JANUVIA) 100 MG tablet Take 1 tablet (100 mg total) by mouth daily. 03/24/21   [name], MD  ?  ? ?Family History  ?Problem Relation Age of Onset  ? Other Father   ?     some sort of blood cancer-had to get transfusions  ? Stomach cancer Maternal Grandmother   ? Stomach cancer Maternal Grandfather   ? ? ?Social History  ? ?Socioeconomic History  ? Marital status: Widowed  ?  Spouse name: Not on file  ? Number of children: 3  ? Years of education: Not on file  ? Highest education level: Not on file  ?Occupational History  ? Occupation: retired  ?Tobacco Use  ? Smoking status: Every Day  ?  Packs/day: 1.00  ?  Types: Cigarettes  ? Smokeless tobacco: Never  ? Tobacco comments:  ?  started age 17  ?Vaping Use  ? Vaping Use: Never used  ?Substance and Sexual Activity  ? Alcohol use: No  ? Drug use: No  ? Sexual activity: Not Currently  ?Other Topics Concern  ? Not on file  ?Social History Narrative  ? Not on file  ? ?Social Determinants of Health  ? ?Financial Resource Strain: Low Risk   ? Difficulty of Paying Living Expenses: Not hard at all  ?Food Insecurity: No Food Insecurity  ? Worried About Charity fundraiser in the Last Year: Never true  ? Ran Out of Food in the Last Year: Never true  ?Transportation Needs: No Transportation Needs  ? Lack of Transportation (Medical): No  ? Lack of Transportation (Non-Medical): No  ?Physical Activity: Insufficiently Active  ? Days of Exercise per Week: 5 days  ? Minutes of Exercise per Session: 20 min  ?Stress: No Stress Concern Present  ? Feeling of Stress : Not at all  ?Social Connections: Moderately Integrated  ? Frequency of Communication with Friends and  Family: More than three times a week  ? Frequency of Social Gatherings with Friends and Family: More than three times a week  ? Attends Religious Services: 1 to 4 times per year  ? Active Member of Clubs or Organizations: Yes  ? Attends Archivist Meetings: Never  ? Marital Status: Widowed  ? ? ?  ? ?Review of Systems: A 12 point ROS discussed and pertinent positives are indicated in the HPI above.  All other systems are negative. ? ?  Review of Systems ? ?Vital Signs: ?There were no vitals taken for this visit. ? ?Physical Exam ?General: 81 yo female appearing stated age.  Well-developed, well-nourished.  No distress. ?HEENT: Atraumatic, normocephalic.  Glasses. Conjugate gaze, extra-ocular motor intact. No scleral icterus or scleral injection. No lesions on external ears, nose, lips, or gums.  Oral mucosa moist, pink.  ?Neck: Symmetric with no goiter enlargement.  ?Chest/Lungs:  [place] o2.  Symmetric chest with inspiration/expiration.  Heart:   No JVD appreciated.  ?Abdomen:  non-obese.   ?Genito-urinary: Deferred ?Neurologic: Alert & Oriented to person, place, and time.   Normal affect and insight.  Appropriate questions.  Moving all 4 extremities with gross sensory intact.  ?Pulse Exam:  Doppler + right and left PT & DP. ?Extremities: No wound.  Hypertrophic nails.  Scaling/dryness on feet bilateral.  No intertriginous wounds.  ? ? ?Imaging: ?No results found. ? ?Labs: ? ?CBC: ?Recent Labs  ?  12/09/20 ?1152  ?WBC 9.7  ?HGB 12.0  ?HCT 39.2  ?PLT 469*  ? ? ?COAGS: ?No results for input(s): INR, APTT in the last 8760 hours. ? ?BMP: ?Recent Labs  ?  12/09/20 ?1152  ?NA 142  ?K 4.5  ?CL 103  ?CO2 31  ?GLUCOSE 121*  ?BUN 27*  ?CALCIUM 9.7  ?CREATININE 0.59*  ? ? ?LIVER FUNCTION TESTS: ?Recent Labs  ?  12/09/20 ?1152  ?BILITOT 0.3  ?AST 15  ?ALT 20  ?PROT 6.6  ? ? ?TUMOR MARKERS: ?No results for input(s): AFPTM, CEA, CA199, CHROMGRNA in the last 8760 hours. ? ?Assessment and Plan: ? ?Assessment:  ?Briana Wurster is a  81yo female with known bilateral Barr, previous left fem-pop bypass.     ? ?Non-invasive lower extremity exam shows right moderate range ABI severity, and mild left range ABI severity.  ? ?Given her degree of m

## 2021-07-21 ENCOUNTER — Telehealth: Payer: Self-pay | Admitting: Pharmacist

## 2021-07-21 NOTE — Progress Notes (Signed)
? ? ?Chronic Care Management ?Pharmacy Assistant  ? ?Name: Briana Barr  MRN: [id number] DOB: 06-26-1940 ? ? ?Reason for Encounter: Medication Coordination Call ?  ? ?Recent office visits:  ?None ? ?Recent consult visits:  ?None ? ?Hospital visits:  ?None in previous 6 months ? ?Medications: ?Outpatient Encounter Medications as of 07/21/2021  ?Medication Sig  ? acetaZOLAMIDE (DIAMOX) 250 MG tablet TAKE ONE TABLET BY MOUTH EVERY MORNING  ? albuterol (PROAIR HFA) 108 (90 Base) MCG/ACT inhaler Inhale 2 puffs into the lungs every 6 (six) hours as needed for wheezing or shortness of breath.  ? aspirin 81 MG tablet Take 81 mg by mouth daily.  ? Cholecalciferol (VITAMIN D3) 250 MCG (10000 UT) capsule Take 1 capsule (10,000 Units total) by mouth daily.  ? ELDERBERRY PO Take 1 each by mouth daily.  ? escitalopram (LEXAPRO) 10 MG tablet Take 1 tablet (10 mg total) by mouth daily.  ? Fluticasone-Umeclidin-Vilant (TRELEGY ELLIPTA) 100-62.5-25 MCG/ACT AEPB Inhale 1 Dose into the lungs daily.  ? glipiZIDE (GLUCOTROL XL) 5 MG 24 hr tablet Take 1 tablet (5 mg total) by mouth daily with breakfast.  ? HYDROcodone-acetaminophen (NORCO/VICODIN) 5-325 MG tablet TAKE ONE TABLET BY MOUTH four times daily AS NEEDED FOR SEVERE pain  ? ipratropium-albuterol (DUONEB) 0.5-2.5 (3) MG/3ML SOLN Take 3 mLs by nebulization every 6 (six) hours as needed.  ? losartan (COZAAR) 50 MG tablet Take 1 tablet (50 mg total) by mouth daily.  ? metFORMIN (GLUCOPHAGE) 1000 MG tablet TAKE ONE TABLET BY MOUTH TWICE DAILY WITH FOOD  ? Multiple Vitamins-Minerals (CENTRUM SILVER 50+WOMEN) TABS Take 1 tablet by mouth daily.  ? omeprazole (PRILOSEC) 20 MG capsule TAKE 1 CAPSULE BY MOUTH TWICE A DAY BEFORE A MEAL  ? rosuvastatin (CRESTOR) 20 MG tablet TAKE ONE TABLET BY MOUTH EVERY EVENING  ? sitaGLIPtin (JANUVIA) 100 MG tablet Take 1 tablet (100 mg total) by mouth daily.  ? ?No facility-administered encounter medications on file as of 07/21/2021.  ? ?Reviewed chart for  medication changes ahead of medication coordination call. ? ?No OVs, Consults, or hospital visits since last care coordination call/Pharmacist visit.  ? ?No medication changes indicated. ? ?BP Readings from Last 3 Encounters:  ?05/13/21 126/65  ?04/18/21 120/80  ?04/17/21 138/69  ?  ?Lab Results  ?Component Value Date  ? HGBA1C 7.7 (H) 12/09/2020  ?  ? ?Patient obtains medications through Adherence Packaging  30 Days  ? ?Last adherence delivery included:  ?Acetazolamide 250 mg one tablet at breakfast ?Glipizide ER 5 mg one tablet at breakfast ?Metformin 100 mg one at evening meal ?Rosuvastatin 20 mg one at evening meal ?Escitalopram 10 mg one at breakfast ?Losartan Potassium 50 mg one at breakfast ?Omeprazole 20 mg one at breakfast, one at evening meal ?Januvia 100 mg one at breakfast ?Trelegy 100 mcg one puff daily ?Hydrocodone-APAP 5-325 mg q4hrs prn ? ?Patient is due for next adherence delivery on: 07/31/2021. ?Called patient and reviewed medications and coordinated delivery. ? ?This delivery to include: ?Acetazolamide 250 mg one tablet at breakfast ?Glipizide ER 5 mg one tablet at breakfast ?Metformin 100 mg one at evening meal ?Rosuvastatin 20 mg one at evening meal ?Escitalopram 10 mg one at breakfast ?Losartan Potassium 50 mg one at breakfast ?Omeprazole 20 mg one at breakfast, one at evening meal ?Januvia 100 mg one at breakfast ?Trelegy 100 mcg one puff daily ?Hydrocodone-APAP 5-325 mg q4hrs prn ? ?Confirmed delivery date of 07/31/2021, advised patient that pharmacy will contact them the morning of delivery. ? ?  Care Gaps: ?Medicare Annual Wellness: Completed 03/24/2021 ?Ophthalmology Exam: Overdue since 12/31/2018 ?Foot Exam: Overdue - never done ?Hemoglobin A1C: 7.7% on 12/09/2020 ?Colonoscopy: Aged out, last completed 11/23/2016 ?Dexa Scan: Completed ?Mammogram: Aged out ? ?Future Appointments  ?Date Time Provider [place]  ?04/02/2022 10:30 AM BSF[facility]-NURSE HEALTH ADVISOR [facility]  ? ?April D  Calhoun, [place] ?Clinical Pharmacist Assistant ?[phone] ?

## 2021-08-15 DIAGNOSIS — Z20822 Contact with and (suspected) exposure to covid-19: Secondary | ICD-10-CM | POA: Diagnosis not present

## 2021-08-20 ENCOUNTER — Telehealth: Payer: Self-pay | Admitting: Pharmacist

## 2021-08-20 ENCOUNTER — Other Ambulatory Visit: Payer: Self-pay

## 2021-08-20 NOTE — Telephone Encounter (Signed)
Requested medication (s) are due for refill today: yes ? ?Requested medication (s) are on the active medication list: yes ? ?Last refill:  glipizide 12/24/20 #30/3, norco 07/11/21 #90/0, metformin 04/22/21 #60/3 ? ?Future visit scheduled: no ? ?Notes to clinic:  glipizide and metformin Unable to refill per protocol due to failed labs, no updated results. ?Norco Unable to refill per protocol, cannot delegate. ? ?  ?Requested Prescriptions  ?Pending Prescriptions Disp Refills  ? glipiZIDE (GLUCOTROL XL) 5 MG 24 hr tablet 30 tablet 3  ?  Sig: Take 1 tablet (5 mg total) by mouth daily with breakfast.  ?  ? Endocrinology:  Diabetes - Sulfonylureas Failed - 08/20/2021  1:23 PM  ?  ?  Failed - HBA1C is between 0 and 7.9 and within 180 days  ?  Hgb A1c MFr Bld  ?Date Value Ref Range Status  ?12/09/2020 7.7 (H) <5.7 % of total Hgb Final  ?  Comment:  ?  For someone without known diabetes, a hemoglobin A1c ?value of 6.5% or greater indicates that they may have  ?diabetes and this should be confirmed with a follow-up  ?test. ?. ?For someone with known diabetes, a value <7% indicates  ?that their diabetes is well controlled and a value  ?greater than or equal to 7% indicates suboptimal  ?control. A1c targets should be individualized based on  ?duration of diabetes, age, comorbid conditions, and  ?other considerations. ?. ?Currently, no consensus exists regarding use of ?hemoglobin A1c for diagnosis of diabetes for children. ?. ?  ?  ?  ?  ?  Failed - Cr in normal range and within 360 days  ?  Creat  ?Date Value Ref Range Status  ?12/09/2020 0.59 (L) 0.60 - 1.00 mg/dL Final  ?  ?  ?  ?  Passed - Valid encounter within last 6 months  ?  Recent Outpatient Visits   ? ?      ? 4 months ago Community acquired pneumonia of left lower lobe of lung  ? [facility], Cammie Mcgee, MD  ? 8 months ago Diabetes mellitus type 2 in nonobese [facility])  ? [facility], Cammie Mcgee, MD  ? 1 year ago Panlobular  emphysema [facility])  ? [facility], Cammie Mcgee, MD  ? 1 year ago Shortness of breath  ? [facility], Cammie Mcgee, MD  ? 1 year ago Chronic respiratory failure with hypoxia [facility])  ? [facility], Cammie Mcgee, MD  ? ?  ?  ? ? ?  ?  ?  ? HYDROcodone-acetaminophen (NORCO/VICODIN) 5-325 MG tablet 90 tablet 0  ?  Sig: TAKE ONE TABLET BY MOUTH four times daily AS NEEDED FOR SEVERE pain  ?  ? Not Delegated - Analgesics:  Opioid Agonist Combinations Failed - 08/20/2021  1:23 PM  ?  ?  Failed - This refill cannot be delegated  ?  ?  Failed - Urine Drug Screen completed in last 360 days  ?  ?  Failed - Valid encounter within last 3 months  ?  Recent Outpatient Visits   ? ?      ? 4 months ago Community acquired pneumonia of left lower lobe of lung  ? [facility], Cammie Mcgee, MD  ? 8 months ago Diabetes mellitus type 2 in nonobese [facility])  ? [facility] Pickard, Cammie Mcgee, MD  ? 1 year ago Panlobular  emphysema ([place])  ? [facility] Pickard, Cammie Mcgee, MD  ? 1 year ago Shortness of breath  ? [facility] Pickard, Cammie Mcgee, MD  ? 1 year ago Chronic respiratory failure with hypoxia [facility])  ? [facility] Pickard, Cammie Mcgee, MD  ? ?  ?  ? ? ?  ?  ?  ? metFORMIN (GLUCOPHAGE) 1000 MG tablet 60 tablet 3  ?  Sig: Take 1 tablet (1,000 mg total) by mouth 2 (two) times daily with a meal.  ?  ? Endocrinology:  Diabetes - Biguanides Failed - 08/20/2021  1:23 PM  ?  ?  Failed - Cr in normal range and within 360 days  ?  Creat  ?Date Value Ref Range Status  ?12/09/2020 0.59 (L) 0.60 - 1.00 mg/dL Final  ?  ?  ?  ?  Failed - HBA1C is between 0 and 7.9 and within 180 days  ?  Hgb A1c MFr Bld  ?Date Value Ref Range Status  ?12/09/2020 7.7 (H) <5.7 % of total Hgb Final  ?  Comment:  ?  For someone without known diabetes, a hemoglobin A1c ?value of 6.5% or greater indicates that they may have  ?diabetes and  this should be confirmed with a follow-up  ?test. ?. ?For someone with known diabetes, a value <7% indicates  ?that their diabetes is well controlled and a value  ?greater than or equal to 7% indicates suboptimal  ?control. A1c targets should be individualized based on  ?duration of diabetes, age, comorbid conditions, and  ?other considerations. ?. ?Currently, no consensus exists regarding use of ?hemoglobin A1c for diagnosis of diabetes for children. ?. ?  ?  ?  ?  ?  Passed - eGFR in normal range and within 360 days  ?  GFR, Est African American  ?Date Value Ref Range Status  ?12/01/2019 101 > OR = 60 mL/min/1.40m2 Final  ? ?GFR, Est Non African American  ?Date Value Ref Range Status  ?12/01/2019 87 > OR = 60 mL/min/1.64m2 Final  ? ?GFR, Estimated  ?Date Value Ref Range Status  ?03/16/2020 >60 >60 mL/min Final  ?  Comment:  ?  (NOTE) ?Calculated using the CKD-EPI Creatinine Equation (2021) ?  ? ?eGFR  ?Date Value Ref Range Status  ?12/09/2020 92 > OR = 60 mL/min/1.12m2 Final  ?  Comment:  ?  The eGFR is based on the CKD-EPI 2021 equation. To calculate  ?the new eGFR from a previous Creatinine or Cystatin C ?result, go to https://www.kidney.org/professionals/ ?kdoqi/gfr%5Fcalculator ?  ?  ?  ?  ?  Passed - B12 Level in normal range and within 720 days  ?  Vitamin B-12  ?Date Value Ref Range Status  ?03/16/2020 632 180 - 914 pg/mL Final  ?  Comment:  ?  (NOTE) ?This assay is not validated for testing neonatal or ?myeloproliferative syndrome specimens for Vitamin B12 levels. ?Performed at [facility], [address] ?[place], [place] [address] ?  ?  ?  ?  ?  Passed - Valid encounter within last 6 months  ?  Recent Outpatient Visits   ? ?      ? 4 months ago Community acquired pneumonia of left lower lobe of lung  ? [facility] Pick[facility], Cammie Mcgee, MD  ? 8 months ago Diabetes mellitus type 2 in nonobese [facility])  ? [facility] Pickard, Cammie Mcgee, MD  ? 1 year ago  Panlobular  emphysema ([place])  ? [facility] Pickard, Cammie Mcgee, MD  ? 1 year ago Shortness of breath  ? [facility] Pickard, Cammie Mcgee, MD  ? 1 year ago Chronic respiratory failure with hypoxia [facility])  ? [facility] Pickard, Cammie Mcgee, MD  ? ?  ?  ? ? ?  ?  ?  Passed - CBC within normal limits and completed in the last 12 months  ?  WBC  ?Date Value Ref Range Status  ?12/09/2020 9.7 3.8 - 10.8 Thousand/uL Final  ? ?RBC  ?Date Value Ref Range Status  ?12/09/2020 4.26 3.80 - 5.10 Million/uL Final  ? ?Hemoglobin  ?Date Value Ref Range Status  ?12/09/2020 12.0 11.7 - 15.5 g/dL Final  ? ?HCT  ?Date Value Ref Range Status  ?12/09/2020 39.2 35.0 - 45.0 % Final  ? ?MCHC  ?Date Value Ref Range Status  ?12/09/2020 30.6 (L) 32.0 - 36.0 g/dL Final  ? ?MCH  ?Date Value Ref Range Status  ?12/09/2020 28.2 27.0 - 33.0 pg Final  ? ?MCV  ?Date Value Ref Range Status  ?12/09/2020 92.0 80.0 - 100.0 fL Final  ? ?No results found for: PLTCOUNTKUC, LABPLAT, [place] ?RDW  ?Date Value Ref Range Status  ?12/09/2020 12.9 11.0 - 15.0 % Final  ? ?  ?  ?  ? ?

## 2021-08-20 NOTE — Progress Notes (Signed)
? ? ?Chronic Care Management ?Pharmacy Assistant  ? ?Name: Briana Barr  MRN: [id number] DOB: May 23, 1940 ? ? ?Reason for Encounter: Medication Coordination Call ?  ? ?Recent office visits:  ?None ? ?Recent consult visits:  ?None ? ?Hospital visits:  ?None in previous 6 months ? ?Medications: ?Outpatient Encounter Medications as of 08/20/2021  ?Medication Sig  ? acetaZOLAMIDE (DIAMOX) 250 MG tablet TAKE ONE TABLET BY MOUTH EVERY MORNING  ? albuterol (PROAIR HFA) 108 (90 Base) MCG/ACT inhaler Inhale 2 puffs into the lungs every 6 (six) hours as needed for wheezing or shortness of breath.  ? aspirin 81 MG tablet Take 81 mg by mouth daily.  ? Cholecalciferol (VITAMIN D3) 250 MCG (10000 UT) capsule Take 1 capsule (10,000 Units total) by mouth daily.  ? ELDERBERRY PO Take 1 each by mouth daily.  ? escitalopram (LEXAPRO) 10 MG tablet Take 1 tablet (10 mg total) by mouth daily.  ? Fluticasone-Umeclidin-Vilant (TRELEGY ELLIPTA) 100-62.5-25 MCG/ACT AEPB Inhale 1 Dose into the lungs daily.  ? glipiZIDE (GLUCOTROL XL) 5 MG 24 hr tablet Take 1 tablet (5 mg total) by mouth daily with breakfast.  ? HYDROcodone-acetaminophen (NORCO/VICODIN) 5-325 MG tablet TAKE ONE TABLET BY MOUTH four times daily AS NEEDED FOR SEVERE pain  ? ipratropium-albuterol (DUONEB) 0.5-2.5 (3) MG/3ML SOLN Take 3 mLs by nebulization every 6 (six) hours as needed.  ? losartan (COZAAR) 50 MG tablet Take 1 tablet (50 mg total) by mouth daily.  ? metFORMIN (GLUCOPHAGE) 1000 MG tablet TAKE ONE TABLET BY MOUTH TWICE DAILY WITH FOOD  ? Multiple Vitamins-Minerals (CENTRUM SILVER 50+WOMEN) TABS Take 1 tablet by mouth daily.  ? omeprazole (PRILOSEC) 20 MG capsule TAKE 1 CAPSULE BY MOUTH TWICE A DAY BEFORE A MEAL  ? rosuvastatin (CRESTOR) 20 MG tablet TAKE ONE TABLET BY MOUTH EVERY EVENING  ? sitaGLIPtin (JANUVIA) 100 MG tablet Take 1 tablet (100 mg total) by mouth daily.  ? ?No facility-administered encounter medications on file as of 08/20/2021.  ? ?Reviewed chart for  medication changes ahead of medication coordination call. ? ?No OVs, Consults, or hospital visits since last care coordination call/Pharmacist visit ? ?BP Readings from Last 3 Encounters:  ?05/13/21 126/65  ?04/18/21 120/80  ?04/17/21 138/69  ?  ?Lab Results  ?Component Value Date  ? HGBA1C 7.7 (H) 12/09/2020  ?  ? ?Patient obtains medications through Adherence Packaging  30 Days  ? ?Last adherence delivery included: ?Acetazolamide 250 mg one tablet at breakfast ?Glipizide ER 5 mg one tablet at breakfast ?Metformin 100 mg one at evening meal ?Rosuvastatin 20 mg one at evening meal ?Escitalopram 10 mg one at breakfast ?Losartan Potassium 50 mg one at breakfast ?Omeprazole 20 mg one at breakfast, one at evening meal ?Januvia 100 mg one at breakfast ?Trelegy 100 mcg one puff daily ?Hydrocodone-APAP 5-325 mg q4hrs prn ? ?Patient is due for next adherence delivery on: 09/01/2021. ?Called patient and reviewed medications and coordinated delivery. ? ?This delivery to include: ?Acetazolamide 250 mg one tablet at breakfast ?Glipizide ER 5 mg one tablet at breakfast ?Metformin 100 mg one at evening meal ?Rosuvastatin 20 mg one at evening meal ?Escitalopram 10 mg one at breakfast ?Losartan Potassium 50 mg one at breakfast ?Omeprazole 20 mg one at breakfast, one at evening meal ?Januvia 100 mg one at breakfast ?Trelegy 100 mcg one puff daily ?Hydrocodone-APAP 5-325 mg q4hrs prn ? ?Patient needs refills for: ?Glipizide ER 5 mg one tablet at breakfast ?Metformin 100 mg one at evening meal ?Hydrocodone-APAP 5-325 mg q4hrs prn ?-  Rx ref[facility] to [facility]. ? ?Confirmed delivery date of 09/01/2021, advised patient that pharmacy will contact them the morning of delivery. ? ?Care Gaps: ?Medicare Annual Wellness: Completed 03/24/2021 ?Ophthalmology Exam: Overdue since 12/31/2018 ?Foot Exam: Overdue - never done ?Hemoglobin A1C: 7.7% on 12/09/2020 ?Colonoscopy: Completed 11/23/2016 ?Dexa Scan: Completed ? ?Future Appointments  ?Date Time  Provider [place]  ?04/02/2022 10:30 AM BSFM-NURSE HEALTH ADVISOR [facility]  ? ?April D Calhoun, [place] ?Clinical Pharmacist Assistant ?[phone] ? ?

## 2021-08-20 NOTE — Telephone Encounter (Signed)
-----   Message from April D Calhoun sent at 08/20/2021 12:36 PM EDT ----- ?Regarding: Rx refill request ?Patient has an upcoming delivery with [facility]. She will need refills on the following medications to complete her order: ? ?Glipizide?ER 5 mg one tablet at breakfast ?Metformin 100 mg one at evening meal ?Hydrocodone-APAP 5-325 mg q4hrs prn ? ?Please send refills to [facility]. ? ?Thank you, ? ?April D Calhoun, [place] ?Clinical Pharmacist Assistant ?[phone] ? ? ?

## 2021-08-26 ENCOUNTER — Other Ambulatory Visit: Payer: Self-pay | Admitting: Family Medicine

## 2021-08-27 NOTE — Telephone Encounter (Signed)
LOV 04/18/21 ?Last refill 07/01/21, #90, 0 refills ? ?Please review, thanks! ? ?

## 2021-08-27 NOTE — Telephone Encounter (Signed)
Requested medications are due for refill today.  yes ? ?Requested medications are on the active medications list.  yes ? ?Last refill. 07/01/2021 #90 0 refills ? ?Future visit scheduled.   no ? ?Notes to clinic.  Medication refill is not delegated. ? ? ? ?Requested Prescriptions  ?Pending Prescriptions Disp Refills  ? HYDROcodone-acetaminophen (NORCO/VICODIN) 5-325 MG tablet [Pharmacy Med Name: hydrocodone 5 mg-acetaminophen 325 mg tablet] 90 tablet 0  ?  Sig: TAKE ONE TABLET BY MOUTH four times daily AS NEEDED FOR SEVERE pain  ?  ? Not Delegated - Analgesics:  Opioid Agonist Combinations Failed - 08/26/2021 11:32 AM  ?  ?  Failed - This refill cannot be delegated  ?  ?  Failed - Urine Drug Screen completed in last 360 days  ?  ?  Failed - Valid encounter within last 3 months  ?  Recent Outpatient Visits   ? ?      ? 4 months ago Community acquired pneumonia of left lower lobe of lung  ? [facility] Pickard, Cammie Mcgee, MD  ? 8 months ago Diabetes mellitus type 2 in nonobese [facility])  ? [facility] Pickard, Cammie Mcgee, MD  ? 1 year ago Panlobular emphysema [facility])  ? [facility] Pickard, Cammie Mcgee, MD  ? 1 year ago Shortness of breath  ? [facility] Pickard, Cammie Mcgee, MD  ? 1 year ago Chronic respiratory failure with hypoxia [facility])  ? [facility] Pickard, Cammie Mcgee, MD  ? ?  ?  ? ? ?  ?  ?  ?Signed Prescriptions Disp Refills  ? glipiZIDE (GLUCOTROL XL) 5 MG 24 hr tablet 30 tablet 2  ?  Sig: TAKE ONE TABLET BY MOUTH EVERY MORNING WITH BREAKFAST  ?  ? Endocrinology:  Diabetes - Sulfonylureas Failed - 08/26/2021 11:32 AM  ?  ?  Failed - HBA1C is between 0 and 7.9 and within 180 days  ?  Hgb A1c MFr Bld  ?Date Value Ref Range Status  ?12/09/2020 7.7 (H) <5.7 % of total Hgb Final  ?  Comment:  ?  For someone without known diabetes, a hemoglobin A1c ?value of 6.5% or greater indicates that they may have  ?diabetes and this should be confirmed with a  follow-up  ?test. ?. ?For someone with known diabetes, a value <7% indicates  ?that their diabetes is well controlled and a value  ?greater than or equal to 7% indicates suboptimal  ?control. A1c targets should be individualized based on  ?duration of diabetes, age, comorbid conditions, and  ?other considerations. ?. ?Currently, no consensus exists regarding use of ?hemoglobin A1c for diagnosis of diabetes for children. ?. ?  ?   ?  ?  Failed - Cr in normal range and within 360 days  ?  Creat  ?Date Value Ref Range Status  ?12/09/2020 0.59 (L) 0.60 - 1.00 mg/dL Final  ?   ?  ?  Passed - Valid encounter within last 6 months  ?  Recent Outpatient Visits   ? ?      ? 4 months ago Community acquired pneumonia of left lower lobe of lung  ? [facility] Pickard, Cammie Mcgee, MD  ? 8 months ago Diabetes mellitus type 2 in nonobese [facility])  ? [facility] Pickard, Cammie Mcgee, MD  ? 1 year ago Panlobular emphysema [facility])  ? [place] Pickard, Cammie Mcgee, MD  ?  1 year ago Shortness of breath  ? [facility] Pickard, Cammie Mcgee, MD  ? 1 year ago Chronic respiratory failure with hypoxia [facility])  ? [facility] Pickard, Cammie Mcgee, MD  ? ?  ?  ? ? ?  ?  ?  ? metFORMIN (GLUCOPHAGE) 1000 MG tablet 60 tablet 2  ?  Sig: TAKE ONE TABLET BY MOUTH TWICE DAILY WITH FOOD  ?  ? Endocrinology:  Diabetes - Biguanides Failed - 08/26/2021 11:32 AM  ?  ?  Failed - Cr in normal range and within 360 days  ?  Creat  ?Date Value Ref Range Status  ?12/09/2020 0.59 (L) 0.60 - 1.00 mg/dL Final  ?   ?  ?  Failed - HBA1C is between 0 and 7.9 and within 180 days  ?  Hgb A1c MFr Bld  ?Date Value Ref Range Status  ?12/09/2020 7.7 (H) <5.7 % of total Hgb Final  ?  Comment:  ?  For someone without known diabetes, a hemoglobin A1c ?value of 6.5% or greater indicates that they may have  ?diabetes and this should be confirmed with a follow-up  ?test. ?. ?For someone with known diabetes, a value  <7% indicates  ?that their diabetes is well controlled and a value  ?greater than or equal to 7% indicates suboptimal  ?control. A1c targets should be individualized based on  ?duration of diabetes, age, comorbid conditions, and  ?other considerations. ?. ?Currently, no consensus exists regarding use of ?hemoglobin A1c for diagnosis of diabetes for children. ?. ?  ?   ?  ?  Passed - eGFR in normal range and within 360 days  ?  GFR, Est African American  ?Date Value Ref Range Status  ?12/01/2019 101 > OR = 60 mL/min/1.33m2 Final  ? ?GFR, Est Non African American  ?Date Value Ref Range Status  ?12/01/2019 87 > OR = 60 mL/min/1.33m2 Final  ? ?GFR, Estimated  ?Date Value Ref Range Status  ?03/16/2020 >60 >60 mL/min Final  ?  Comment:  ?  (NOTE) ?Calculated using the CKD-EPI Creatinine Equation (2021) ?  ? ?eGFR  ?Date Value Ref Range Status  ?12/09/2020 92 > OR = 60 mL/min/1.40m2 Final  ?  Comment:  ?  The eGFR is based on the CKD-EPI 2021 equation. To calculate  ?the new eGFR from a previous Creatinine or Cystatin C ?result, go to https://www.kidney.org/professionals/ ?kdoqi/gfr%5Fcalculator ?  ?   ?  ?  Passed - B12 Level in normal range and within 720 days  ?  Vitamin B-12  ?Date Value Ref Range Status  ?03/16/2020 632 180 - 914 pg/mL Final  ?  Comment:  ?  (NOTE) ?This assay is not validated for testing neonatal or ?myeloproliferative syndrome specimens for Vitamin B12 levels. ?Performed at [facility], [address] ?[place], [place] [address] ?  ?   ?  ?  Passed - Valid encounter within last 6 months  ?  Recent Outpatient Visits   ? ?      ? 4 months ago Community acquired pneumonia of left lower lobe of lung  ? [facility] Pickard, Cammie Mcgee, MD  ? 8 months ago Diabetes mellitus type 2 in nonobese [facility])  ? [facility] Pickard, Cammie Mcgee, MD  ? 1 year ago Panlobular emphysema [facility])  ? [facility] Pickard, Cammie Mcgee, MD  ? 1 year ago Shortness of  breath  ? [place]  [facility] [name], MD  ? 1 year ago Chronic respiratory failure with hypoxia ([place])  ? [facility] Pickard, Cammie Mcgee, MD  ? ?  ?  ? ? ?  ?  ?  Passed - CBC within normal limits and completed in the last 12 months  ?  WBC  ?Date Value Ref Range Status  ?12/09/2020 9.7 3.8 - 10.8 Thousand/uL Final  ? ?RBC  ?Date Value Ref Range Status  ?12/09/2020 4.26 3.80 - 5.10 Million/uL Final  ? ?Hemoglobin  ?Date Value Ref Range Status  ?12/09/2020 12.0 11.7 - 15.5 g/dL Final  ? ?HCT  ?Date Value Ref Range Status  ?12/09/2020 39.2 35.0 - 45.0 % Final  ? ?MCHC  ?Date Value Ref Range Status  ?12/09/2020 30.6 (L) 32.0 - 36.0 g/dL Final  ? ?MCH  ?Date Value Ref Range Status  ?12/09/2020 28.2 27.0 - 33.0 pg Final  ? ?MCV  ?Date Value Ref Range Status  ?12/09/2020 92.0 80.0 - 100.0 fL Final  ? ?No results found for: PLTCOUNTKUC, LABPLAT, [place] ?RDW  ?Date Value Ref Range Status  ?12/09/2020 12.9 11.0 - 15.0 % Final  ? ?  ?  ?  ? s ?

## 2021-08-27 NOTE — Telephone Encounter (Signed)
Requested Prescriptions  ?Pending Prescriptions Disp Refills  ?? glipiZIDE (GLUCOTROL XL) 5 MG 24 hr tablet [Pharmacy Med Name: glipizide ER 5 mg tablet, extended release 24 hr] 30 tablet 2  ?  Sig: TAKE ONE TABLET BY MOUTH EVERY MORNING WITH BREAKFAST  ?  ? Endocrinology:  Diabetes - Sulfonylureas Failed - 08/26/2021 11:32 AM  ?  ?  Failed - HBA1C is between 0 and 7.9 and within 180 days  ?  Hgb A1c MFr Bld  ?Date Value Ref Range Status  ?12/09/2020 7.7 (H) <5.7 % of total Hgb Final  ?  Comment:  ?  For someone without known diabetes, a hemoglobin A1c ?value of 6.5% or greater indicates that they may have  ?diabetes and this should be confirmed with a follow-up  ?test. ?. ?For someone with known diabetes, a value <7% indicates  ?that their diabetes is well controlled and a value  ?greater than or equal to 7% indicates suboptimal  ?control. A1c targets should be individualized based on  ?duration of diabetes, age, comorbid conditions, and  ?other considerations. ?. ?Currently, no consensus exists regarding use of ?hemoglobin A1c for diagnosis of diabetes for children. ?. ?  ?   ?  ?  Failed - Cr in normal range and within 360 days  ?  Creat  ?Date Value Ref Range Status  ?12/09/2020 0.59 (L) 0.60 - 1.00 mg/dL Final  ?   ?  ?  Passed - Valid encounter within last 6 months  ?  Recent Outpatient Visits   ?      ? 4 months ago Community acquired pneumonia of left lower lobe of lung  ? [facility] Pickard, Cammie Mcgee, MD  ? 8 months ago Diabetes mellitus type 2 in nonobese [facility])  ? [facility] Pickard, Cammie Mcgee, MD  ? 1 year ago Panlobular emphysema [facility])  ? [facility] Pickard, Cammie Mcgee, MD  ? 1 year ago Shortness of breath  ? [facility] Pickard, Cammie Mcgee, MD  ? 1 year ago Chronic respiratory failure with hypoxia [facility])  ? [facility] Pickard, Cammie Mcgee, MD  ?  ?  ? ?  ?  ?  ?? metFORMIN (GLUCOPHAGE) 1000 MG tablet [Pharmacy Med Name:  metformin 1,000 mg tablet] 60 tablet 2  ?  Sig: TAKE ONE TABLET BY MOUTH TWICE DAILY WITH FOOD  ?  ? Endocrinology:  Diabetes - Biguanides Failed - 08/26/2021 11:32 AM  ?  ?  Failed - Cr in normal range and within 360 days  ?  Creat  ?Date Value Ref Range Status  ?12/09/2020 0.59 (L) 0.60 - 1.00 mg/dL Final  ?   ?  ?  Failed - HBA1C is between 0 and 7.9 and within 180 days  ?  Hgb A1c MFr Bld  ?Date Value Ref Range Status  ?12/09/2020 7.7 (H) <5.7 % of total Hgb Final  ?  Comment:  ?  For someone without known diabetes, a hemoglobin A1c ?value of 6.5% or greater indicates that they may have  ?diabetes and this should be confirmed with a follow-up  ?test. ?. ?For someone with known diabetes, a value <7% indicates  ?that their diabetes is well controlled and a value  ?greater than or equal to 7% indicates suboptimal  ?control. A1c targets should be individualized based on  ?duration of diabetes, age, comorbid conditions, and  ?other considerations. ?. ?Currently, no consensus exists regarding use of ?hemoglobin  A1c for diagnosis of diabetes for children. ?. ?  ?   ?  ?  Passed - eGFR in normal range and within 360 days  ?  GFR, Est African American  ?Date Value Ref Range Status  ?12/01/2019 101 > OR = 60 mL/min/1.14m Final  ? ?GFR, Est Non African American  ?Date Value Ref Range Status  ?12/01/2019 87 > OR = 60 mL/min/1.719mFinal  ? ?GFR, Estimated  ?Date Value Ref Range Status  ?03/16/2020 >60 >60 mL/min Final  ?  Comment:  ?  (NOTE) ?Calculated using the CKD-EPI Creatinine Equation (2021) ?  ? ?eGFR  ?Date Value Ref Range Status  ?12/09/2020 92 > OR = 60 mL/min/1.7334minal  ?  Comment:  ?  The eGFR is based on the CKD-EPI 2021 equation. To calculate  ?the new eGFR from a previous Creatinine or Cystatin C ?result, go to https://www.kidney.org/professionals/ ?kdoqi/gfr%5Fcalculator ?  ?   ?  ?  Passed - B12 Level in normal range and within 720 days  ?  Vitamin B-12  ?Date Value Ref Range Status  ?03/16/2020 632 180  - 914 pg/mL Final  ?  Comment:  ?  (NOTE) ?This assay is not validated for testing neonatal or ?myeloproliferative syndrome specimens for Vitamin B12 levels. ?Performed at [facility] [id number] ?   ?  ?  Passed - Valid encounter within last 6 months  ?  Recent Outpatient Visits   ?      ? 4 months ago Community acquired pneumonia of left lower lobe of lung  ? [facility], [place] [facility]gee[place]  ? 8 months ago Diabetes mellitus type 2 in nonobese ([facility], War[place]ie McgeeD  ? 1 year ago [facility] ([facility], WarCammie McgeeD  ? 1 year ago Shortness of breath  ? [facility], War[facility]D  ? 1 year ago [facility] respiratory failure with hypoxia ([facility], WarCammie McgeeD  ?  ?  ? ?  ?  ?  Passed - CBC within normal limits and completed in the last 12 months  ?  WBC  ?Date Value Ref Range Status  ?12/09/2020 9.7 3.8 - 10.8 Thousand/uL Final  ? ?RBC  ?Date Value Ref Range Status  ?12/09/2020 4.26 3.80 - 5.10 Million/uL Final  ? ?Hemoglobin  ?Date Value Ref Range Status  ?12/09/2020 12.0 11.7 - 15.5 g/dL Final  ? ?HCT  ?Date Value Ref Range Status  ?12/09/2020 39.2 35.0 - 45.0 % Final  ? ?MCHC  ?Date Value Ref Range Status  ?12/09/2020 30.6 (L) 32.0 - 36.0 g/dL Final  ? ?MCH  ?Date Value Ref Range Status  ?12/09/2020 28.2 27.0 - 33.0 pg Final  ? ?MCV  ?Date Value Ref Range Status  ?12/09/2020 92.0 80.0 - 100.0 fL Final  ? ?No results found for: PLTCOUNTKUC, LABPLAT, [place]OC[place]DW  ?Date Value Ref Range Status  ?12/09/2020 12.9 11.0 - 15.0 % Final  ? ?  ?  ?  ?? HYDROcodone-acetaminophen (NORCO/VICODIN) 5-325 MG tablet [Pharmacy Med Name: hydrocodone 5 mg-acetaminophen 325 mg tablet] 90 tablet 0  ?  Sig: TAKE ONE TABLET BY MOUTH four times daily AS NEEDED FOR SEVERE pain  ?  ? Not Delegated - Analgesics:  Opioid Agonist  Combinations Failed - 08/26/2021 11:32 AM  ?  ?  Failed - This refill cannot be delegated  ?  ?  Failed - Urine Drug Screen completed in last 360 days  ?  ?  Failed - Valid encounter within last 3 months  ?  Recent Outpatient Visits   ?      ? 4 months ago Community acquired pneumonia of left lower lobe of lung  ? [facility] Pickard, Cammie Mcgee, MD  ? 8 months ago Diabetes mellitus type 2 in nonobese [facility])  ? [facility] Pickard, Cammie Mcgee, MD  ? 1 year ago Panlobular emphysema [facility])  ? [facility] Pickard, Cammie Mcgee, MD  ? 1 year ago Shortness of breath  ? [facility] Pickard, Cammie Mcgee, MD  ? 1 year ago Chronic respiratory failure with hypoxia [facility])  ? [facility] Pickard, Cammie Mcgee, MD  ?  ?  ? ?  ?  ?  ? ?

## 2021-09-02 ENCOUNTER — Telehealth: Payer: Self-pay | Admitting: Family Medicine

## 2021-09-09 ENCOUNTER — Other Ambulatory Visit: Payer: Medicare Other | Admitting: *Deleted

## 2021-09-09 DIAGNOSIS — Z515 Encounter for palliative care: Secondary | ICD-10-CM

## 2021-09-09 NOTE — Progress Notes (Signed)
[facility] COMMUNITY PALLIATIVE CARE RN NOTE  PATIENT NAME: Briana Barr DOB: 1940-07-06 MRN: [id number]  PRIMARY CARE PROVIDER: [name], MD  RESPONSIBLE PARTY: [name] (daughter-in-law) Acct ID - Guarantor Home Phone Work Phone Relationship Acct Type  [id number] [name], [name] [phone]  Self P/F     [address] [place], [place], [place] [address]   Due to the COVID-19 crisis, this virtual check-in visit was done via telephone from my office and it was initiated and consent by this patient and or family.  RN telephonic encounter completed with patient's daughter in law [place]. She reports that patient's condition has been stable. Pain remains well controlled on Hydrocodone. She remains on 6L of oxygen via [place]. Does nebulizer treatment in the am. No issues with breathing or congestion. Appetite remains good. Blood sugars remain under 200 with Glipizide and Januvia. No dysphgia. Ambulating using her walker. No falls. No skin issues at this time. No recent changes made to her plan of care. Palliative care will continue to follow.   (Duration of visit and documentation 15 minutes)   Daryl Eastern, RN BSN

## 2021-09-12 ENCOUNTER — Other Ambulatory Visit: Payer: Self-pay

## 2021-09-12 DIAGNOSIS — J441 Chronic obstructive pulmonary disease with (acute) exacerbation: Secondary | ICD-10-CM

## 2021-09-12 MED ORDER — IPRATROPIUM-ALBUTEROL 0.5-2.5 (3) MG/3ML IN SOLN: 3.0000 mL | Freq: Four times a day (QID) | RESPIRATORY_TRACT | 1 refills | Status: DC | PRN

## 2021-09-19 ENCOUNTER — Telehealth: Payer: Self-pay | Admitting: Pharmacist

## 2021-09-19 ENCOUNTER — Other Ambulatory Visit: Payer: Self-pay | Admitting: Family Medicine

## 2021-09-19 NOTE — Progress Notes (Signed)
Chronic Care Management Pharmacy Assistant   Name: Briana Barr  MRN: [id number] DOB: 1941-03-31   Reason for Encounter: Medication Coordination Call    Recent office visits:  None since last Medication Coordination Call  Recent consult visits:  None since last Medication Coordination Call  Hospital visits:  None in previous 6 months  Medications: Outpatient Encounter Medications as of 09/19/2021  Medication Sig   acetaZOLAMIDE (DIAMOX) 250 MG tablet TAKE ONE TABLET BY MOUTH EVERY MORNING   albuterol (PROAIR HFA) 108 (90 Base) MCG/ACT inhaler Inhale 2 puffs into the lungs every 6 (six) hours as needed for wheezing or shortness of breath.   aspirin 81 MG tablet Take 81 mg by mouth daily.   Cholecalciferol (VITAMIN D3) 250 MCG (10000 UT) capsule Take 1 capsule (10,000 Units total) by mouth daily.   ELDERBERRY PO Take 1 each by mouth daily.   escitalopram (LEXAPRO) 10 MG tablet Take 1 tablet (10 mg total) by mouth daily.   Fluticasone-Umeclidin-Vilant (TRELEGY ELLIPTA) 100-62.5-25 MCG/ACT AEPB Inhale 1 Dose into the lungs daily.   glipiZIDE (GLUCOTROL XL) 5 MG 24 hr tablet TAKE ONE TABLET BY MOUTH EVERY MORNING WITH BREAKFAST   HYDROcodone-acetaminophen (NORCO/VICODIN) 5-325 MG tablet TAKE ONE TABLET BY MOUTH four times daily AS NEEDED FOR SEVERE pain   ipratropium-albuterol (DUONEB) 0.5-2.5 (3) MG/3ML SOLN Take 3 mLs by nebulization every 6 (six) hours as needed.   losartan (COZAAR) 50 MG tablet Take 1 tablet (50 mg total) by mouth daily.   metFORMIN (GLUCOPHAGE) 1000 MG tablet TAKE ONE TABLET BY MOUTH TWICE DAILY WITH FOOD   Multiple Vitamins-Minerals (CENTRUM SILVER 50+WOMEN) TABS Take 1 tablet by mouth daily.   omeprazole (PRILOSEC) 20 MG capsule TAKE 1 CAPSULE BY MOUTH TWICE A DAY BEFORE A MEAL   rosuvastatin (CRESTOR) 20 MG tablet TAKE ONE TABLET BY MOUTH EVERY EVENING   sitaGLIPtin (JANUVIA) 100 MG tablet Take 1 tablet (100 mg total) by mouth daily.   No  facility-administered encounter medications on file as of 09/19/2021.   Reviewed chart for medication changes ahead of medication coordination call.  No OVs, Consults, or hospital visits since last care coordination call/Pharmacist visit.   No medication changes indicated.  BP Readings from Last 3 Encounters:  05/13/21 126/65  04/18/21 120/80  04/17/21 138/69    Lab Results  Component Value Date   HGBA1C 7.7 (H) 12/09/2020     Patient obtains medications through Adherence Packaging  30 Days   Last adherence delivery included: Acetazolamide 250 mg one tablet at breakfast Glipizide ER 5 mg one tablet at breakfast Metformin 100 mg one at evening meal Rosuvastatin 20 mg one at evening meal Escitalopram 10 mg one at breakfast Losartan Potassium 50 mg one at breakfast Omeprazole 20 mg one at breakfast, one at evening meal Januvia 100 mg one at breakfast Trelegy 100 mcg one puff daily Hydrocodone-APAP 5-325 mg q4hrs prn  Patient is due for next adherence delivery on: 09/30/2021. Called patient and reviewed medications and coordinated delivery.  This delivery to include: Acetazolamide 250 mg one tablet at breakfast Glipizide ER 5 mg one tablet at breakfast Metformin 100 mg one at evening meal Rosuvastatin 20 mg one at evening meal Escitalopram 10 mg one at breakfast Losartan Potassium 50 mg one at breakfast Omeprazole 20 mg one at breakfast, one at evening meal Januvia 100 mg one at breakfast Trelegy 100 mcg one puff daily Hydrocodone-APAP 5-325 mg q4hrs prn  Patient needs refills for Hydrocodone -Rx refill request sent to  [facility].  Confirmed delivery date of 09/30/2021, advised patient that pharmacy will contact them the morning of delivery.  Care Gaps: Medicare Annual Wellness: Completed 03/24/2021 Ophthalmology Exam: Overdue since 12/31/2018 Foot Exam: Overdue - never done Hemoglobin A1C: 7.7% on 12/09/2020 Colonoscopy: Aged out, last completed 11/23/2016 Dexa Scan:  Completed Mammogram: Aged out  Future Appointments  Date Time Provider [place]  04/02/2022 10:30 AM [facility] HEALTH ADVISOR [id number]   April D Calhoun, [place] Pharmacist Assistant [phone]

## 2021-09-19 NOTE — Telephone Encounter (Signed)
Requested medication (s) are due for refill today: Yes  Requested medication (s) are on the active medication list: Yes  Last refill:  08/26/21  Future visit scheduled: Yes  Notes to clinic:  Unable to refill per protocol, cannot delegate. [place] requesting 90 day supply sent.     Requested Prescriptions  Pending Prescriptions Disp Refills   HYDROcodone-acetaminophen (NORCO/VICODIN) 5-325 MG tablet 90 tablet 0     Not Delegated - Analgesics:  Opioid Agonist Combinations Failed - 09/19/2021  3:11 PM      Failed - This refill cannot be delegated      Failed - Urine Drug Screen completed in last 360 days      Failed - Valid encounter within last 3 months    Recent Outpatient Visits           5 months ago Community acquired pneumonia of left lower lobe of lung   [place] Pickard, Cammie Mcgee, MD   9 months ago Diabetes mellitus type 2 in nonobese [facility])   [place] [name], MD   1 year ago Panlobular emphysema [facility])   [place] [name], MD   1 year ago Shortness of breath   [place] Pickard, Cammie Mcgee, MD   1 year ago Chronic respiratory failure with hypoxia [facility])   [place] Pickard, Cammie Mcgee, MD                 b

## 2021-09-21 MED ORDER — HYDROCODONE-ACETAMINOPHEN 5-325 MG PO TABS: 1.0000 | ORAL_TABLET | Freq: Four times a day (QID) | ORAL | 0 refills | Status: DC | PRN

## 2021-09-22 ENCOUNTER — Other Ambulatory Visit: Payer: Self-pay | Admitting: Family Medicine

## 2021-09-22 DIAGNOSIS — J441 Chronic obstructive pulmonary disease with (acute) exacerbation: Secondary | ICD-10-CM

## 2021-09-23 NOTE — Telephone Encounter (Signed)
Requested medication (s) are due for refill today: NO  Requested medication (s) are on the active medication list: yes    Last refill: 09/12/21  #360  1 refill  Future visit scheduled no  Notes to clinic:Pharmacy comment: Alternative Requested:THIS IS ON BACKORDER, ONLY ALBUTEROL IS AVAILABLE.  Requested Prescriptions  Pending Prescriptions Disp Refills   ipratropium-albuterol (DUONEB) 0.5-2.5 (3) MG/3ML SOLN [Pharmacy Med Name: IPRAT-ALBUT 0.5-3(2.5) MG/3 ML] 360 mL 1    Sig: INHALE 3 ML BY NEBULIZER EVERY 6 HOURS AS NEEDED     Pulmonology:  Combination Products - albuterol / ipratropium Passed - 09/23/2021  1:50 PM      Passed - Last BP in normal range    BP Readings from Last 1 Encounters:  05/13/21 126/65         Passed - Last Heart Rate in normal range    Pulse Readings from Last 1 Encounters:  05/13/21 81         Passed - Valid encounter within last 12 months    Recent Outpatient Visits           5 months ago Community acquired pneumonia of left lower lobe of lung   [place] Pickard, Cammie Mcgee, MD   9 months ago Diabetes mellitus type 2 in nonobese [facility])   [place] Pickard, Cammie Mcgee, MD   1 year ago Panlobular emphysema [facility])   [place] [name], MD   1 year ago Shortness of breath   [place] Pickard, Cammie Mcgee, MD   1 year ago Chronic respiratory failure with hypoxia [facility])   [facility] Pickard, Cammie Mcgee, MD

## 2021-10-06 ENCOUNTER — Encounter: Payer: Self-pay | Admitting: Family Medicine

## 2021-10-07 ENCOUNTER — Telehealth: Payer: Self-pay

## 2021-10-20 ENCOUNTER — Other Ambulatory Visit: Payer: Medicare Other | Admitting: *Deleted

## 2021-10-20 ENCOUNTER — Telehealth: Payer: Self-pay | Admitting: *Deleted

## 2021-10-20 DIAGNOSIS — Z515 Encounter for palliative care: Secondary | ICD-10-CM

## 2021-10-20 NOTE — Telephone Encounter (Signed)
Called and left a voicemail for patient's daughter-in-law [name] to follow up to see how patient is doing and to schedule follow up visit if necessary. Left my contact information for return call.

## 2021-10-21 NOTE — Progress Notes (Signed)
[facility] COMMUNITY PALLIATIVE CARE RN NOTE  PATIENT NAME: Briana Dewan DOB: 1940/10/25 MRN: [id number]  PRIMARY CARE PROVIDER: [name], MD  RESPONSIBLE PARTY: [name] (daughter in law) Acct ID - Guarantor Home Phone Work Phone Relationship Acct Type  [id number] AMEILIA, RATTAN [phone]  Self P/F     [place], [place], [place] [id number]   I was able to complete a RN telephonic encounter with patient's daughter in law Page. Patient is doing well and condition is stable at this time. Pain remains well controlled with Norco. She remains ambulatory using her walker. Oxygen continues at 6L/min via [place]. Her appetite is good. Blood sugars controlled with Glipizide, Metformin and Januvia. She will be celebrating her 81th birthday next week. No issues or concerns at this time. [name] knows to contact palliative care when needed. Palliative care will continue to follow.  Daryl Eastern, RN BSN

## 2021-10-23 ENCOUNTER — Other Ambulatory Visit: Payer: Self-pay | Admitting: Family Medicine

## 2021-10-23 NOTE — Telephone Encounter (Signed)
Requested medication (s) are due for refill today:   Provider to review  Requested medication (s) are on the active medication list:   Yes  Future visit scheduled:   No   Last ordered: 09/21/2021 #90, 0 refills  Returned because it's a non delegated refill per protocol.   Requested Prescriptions  Pending Prescriptions Disp Refills   HYDROcodone-acetaminophen (NORCO/VICODIN) 5-325 MG tablet [Pharmacy Med Name: hydrocodone 5 mg-acetaminophen 325 mg tablet] 90 tablet 0    Sig: TAKE ONE TABLET BY MOUTH every SIX hours AS NEEDED FOR moderate pain     Not Delegated - Analgesics:  Opioid Agonist Combinations Failed - 10/23/2021 12:13 PM      Failed - This refill cannot be delegated      Failed - Urine Drug Screen completed in last 360 days      Failed - Valid encounter within last 3 months    Recent Outpatient Visits           6 months ago Community acquired pneumonia of left lower lobe of lung   [place] Pickard, Cammie Mcgee, MD   10 months ago Diabetes mellitus type 2 in nonobese [facility])   [place] Pickard, Cammie Mcgee, MD   1 year ago Panlobular emphysema [facility])   [place] [name], MD   1 year ago Shortness of breath   [place] Pickard, Cammie Mcgee, MD   1 year ago Chronic respiratory failure with hypoxia [facility])   [facility] Pickard, Cammie Mcgee, MD

## 2021-10-27 NOTE — Telephone Encounter (Signed)
Requested medication (s) are due for refill today: yes  Requested medication (s) are on the active medication list: yes  Last refill:  09/21/21 #90 and 0 RF  Future visit scheduled: Palliative care pt  Notes to clinic:  This medication can not be delegated, please assess.  Pharmacy requests: Maximum MME cannot be calculated for this prescription. Enter discrete sig details to calculate maximum MME.      Requested Prescriptions  Pending Prescriptions Disp Refills   HYDROcodone-acetaminophen (NORCO/VICODIN) 5-325 MG tablet [Pharmacy Med Name: hydrocodone 5 mg-acetaminophen 325 mg tablet] 90 tablet 0    Sig: TAKE ONE TABLET BY MOUTH every SIX hours AS NEEDED FOR moderate pain     Not Delegated - Analgesics:  Opioid Agonist Combinations Failed - 10/27/2021  4:22 PM      Failed - This refill cannot be delegated      Failed - Urine Drug Screen completed in last 360 days      Failed - Valid encounter within last 3 months    Recent Outpatient Visits           6 months ago Community acquired pneumonia of left lower lobe of lung   [place] Pickard, Cammie Mcgee, MD   10 months ago Diabetes mellitus type 2 in nonobese [facility])   [place] Pickard, Cammie Mcgee, MD   1 year ago Panlobular emphysema [facility])   [place] [name], MD   1 year ago Shortness of breath   [place] Pickard, Cammie Mcgee, MD   1 year ago Chronic respiratory failure with hypoxia [facility])   [facility] Pickard, Cammie Mcgee, MD

## 2021-10-29 ENCOUNTER — Other Ambulatory Visit: Payer: Self-pay

## 2021-10-29 NOTE — Telephone Encounter (Signed)
LOV 04/18/21 Last refill 09/21/21, #90, 0 refills  Please review, thanks!

## 2021-10-30 ENCOUNTER — Other Ambulatory Visit: Payer: Self-pay | Admitting: Family Medicine

## 2021-10-30 MED ORDER — HYDROCODONE-ACETAMINOPHEN 5-325 MG PO TABS: 1.0000 | ORAL_TABLET | Freq: Four times a day (QID) | ORAL | 0 refills | Status: DC | PRN

## 2021-10-30 MED ORDER — ALBUTEROL SULFATE (2.5 MG/3ML) 0.083% IN NEBU: 2.5000 mg | INHALATION_SOLUTION | Freq: Four times a day (QID) | RESPIRATORY_TRACT | 1 refills | Status: DC | PRN

## 2021-10-30 NOTE — Telephone Encounter (Signed)
Spoke w/Pt's daughter, Keane Scrape, aware Rx sent to pharmacy and voiced understanding. Nothing at this time.

## 2021-10-30 NOTE — Telephone Encounter (Signed)
Call pt's daughter, LM for return called re medication request.

## 2021-11-18 ENCOUNTER — Telehealth: Payer: Self-pay | Admitting: Pharmacist

## 2021-11-18 ENCOUNTER — Telehealth: Payer: Self-pay | Admitting: *Deleted

## 2021-11-18 ENCOUNTER — Other Ambulatory Visit: Payer: Self-pay

## 2021-11-18 DIAGNOSIS — E119 Type 2 diabetes mellitus without complications: Secondary | ICD-10-CM

## 2021-11-18 MED ORDER — GLIPIZIDE ER 2.5 MG PO TB24: 5.0000 mg | ORAL_TABLET | Freq: Every day | ORAL | Status: DC

## 2021-11-18 MED ORDER — METFORMIN HCL 1000 MG PO TABS: 1000.0000 mg | ORAL_TABLET | Freq: Two times a day (BID) | ORAL | 3 refills | Status: DC

## 2021-11-18 NOTE — Progress Notes (Signed)
Chronic Care Management Pharmacy Assistant   Name: Briana Barr  MRN: [id number] DOB: 02/21/1941   Reason for Encounter: Medication Coordination Call    Recent office visits:  None  Recent consult visits:  None  Hospital visits:  None in previous 6 months  Medications: Outpatient Encounter Medications as of 11/18/2021  Medication Sig   acetaZOLAMIDE (DIAMOX) 250 MG tablet TAKE ONE TABLET BY MOUTH EVERY MORNING   albuterol (PROAIR HFA) 108 (90 Base) MCG/ACT inhaler Inhale 2 puffs into the lungs every 6 (six) hours as needed for wheezing or shortness of breath.   albuterol (PROVENTIL) (2.5 MG/3ML) 0.083% nebulizer solution Take 3 mLs (2.5 mg total) by nebulization every 6 (six) hours as needed for wheezing or shortness of breath.   aspirin 81 MG tablet Take 81 mg by mouth daily.   Cholecalciferol (VITAMIN D3) 250 MCG (10000 UT) capsule Take 1 capsule (10,000 Units total) by mouth daily.   ELDERBERRY PO Take 1 each by mouth daily.   escitalopram (LEXAPRO) 10 MG tablet Take 1 tablet (10 mg total) by mouth daily.   Fluticasone-Umeclidin-Vilant (TRELEGY ELLIPTA) 100-62.5-25 MCG/ACT AEPB Inhale 1 Dose into the lungs daily.   glipiZIDE (GLUCOTROL XL) 5 MG 24 hr tablet TAKE ONE TABLET BY MOUTH EVERY MORNING WITH BREAKFAST   HYDROcodone-acetaminophen (NORCO/VICODIN) 5-325 MG tablet Take 1 tablet by mouth every 6 (six) hours as needed for moderate pain.   ipratropium-albuterol (DUONEB) 0.5-2.5 (3) MG/3ML SOLN INHALE 3 ML BY NEBULIZER EVERY 6 HOURS AS NEEDED   losartan (COZAAR) 50 MG tablet Take 1 tablet (50 mg total) by mouth daily.   metFORMIN (GLUCOPHAGE) 1000 MG tablet TAKE ONE TABLET BY MOUTH TWICE DAILY WITH FOOD   Multiple Vitamins-Minerals (CENTRUM SILVER 50+WOMEN) TABS Take 1 tablet by mouth daily.   omeprazole (PRILOSEC) 20 MG capsule TAKE 1 CAPSULE BY MOUTH TWICE A DAY BEFORE A MEAL   rosuvastatin (CRESTOR) 20 MG tablet TAKE ONE TABLET BY MOUTH EVERY EVENING   sitaGLIPtin  (JANUVIA) 100 MG tablet Take 1 tablet (100 mg total) by mouth daily.   No facility-administered encounter medications on file as of 11/18/2021.   Reviewed chart for medication changes ahead of medication coordination call.  BP Readings from Last 3 Encounters:  05/13/21 126/65  04/18/21 120/80  04/17/21 138/69    Lab Results  Component Value Date   HGBA1C 7.7 (H) 12/09/2020     Patient obtains medications through Adherence Packaging  30 Days   Last adherence delivery included: Acetazolamide 250 mg one tablet at breakfast Glipizide ER 5 mg one tablet at breakfast Metformin 100 mg one at evening meal Rosuvastatin 20 mg one at evening meal Escitalopram 10 mg one at breakfast Losartan Potassium 50 mg one at breakfast Omeprazole 20 mg one at breakfast, one at evening meal Januvia 100 mg one at breakfast Trelegy 100 mcg one puff daily Hydrocodone-APAP 5-325 mg q4hrs prn  Patient is due for next adherence delivery on: 11/28/2021. Called patient and reviewed medications and coordinated delivery.  This delivery to include: Acetazolamide 250 mg one tablet at breakfast Glipizide ER 5 mg one tablet at breakfast Metformin 100 mg one at evening meal Rosuvastatin 20 mg one at evening meal Escitalopram 10 mg one at breakfast Losartan Potassium 50 mg one at breakfast Omeprazole 20 mg one at breakfast, one at evening meal Januvia 100 mg one at breakfast Trelegy 100 mcg one puff daily Hydrocodone-APAP 5-325 mg q4hrs prn  Patient needs refills for: Glipizide ER 5 mg one tablet  at breakfast Metformin 100 mg one at evening meal Hydrocodone-APAP 5-325 mg q4hrs prn -Rx refill request sent to PCP.  Confirmed delivery date of 11/28/2021, advised patient that pharmacy will contact them the morning of delivery.  Care Gaps: Medicare Annual Wellness: Completed 03/24/2021 Ophthalmology Exam: Overdue since 12/31/2018 Foot Exam: Overdue - never done Hemoglobin A1C: 7.7% on 12/09/2020 Colonoscopy:  Aged out, last completed 11/23/2016 Dexa Scan: Completed Mammogram: Aged out  Future Appointments  Date Time Provider [place]  04/02/2022 10:30 AM [facility] HEALTH ADVISOR [facility]   April D Calhoun, [place] Pharmacist Assistant [phone]

## 2021-11-18 NOTE — Telephone Encounter (Signed)
Rx request for medications needs to be reviewed: Glipizide is listed as clinic administered- unable to reorder Metformin is listed as 1000 mg twice daily- not requested that way Hydrocodone is listed as every 6 hours- requested as every 4 hours  Please review for proper dosing/refills

## 2021-11-18 NOTE — Telephone Encounter (Signed)
-----   Message from April D Calhoun sent at 11/18/2021 12:56 PM EDT ----- Regarding: Rx refill request Patient has an upcoming delivery with [facility]. She will need a 90 DS of the following medications to complete her order:  GlipizideER 5 mg one tablet at breakfast Metformin 100 mg one at evening meal Hydrocodone-APAP 5-325 mg q4hrs prn  Please send refills to [facility].  Thank you,  April D Calhoun, [place] Pharmacist Assistant [phone]

## 2021-11-18 NOTE — Telephone Encounter (Signed)
-----   Message from April D Calhoun sent at 11/18/2021 12:56 PM EDT ----- Regarding: Rx refill request Patient has an upcoming delivery with [facility]. She will need a 90 DS of the following medications to complete her order:  GlipizideER 5 mg one tablet at breakfast Metformin 100 mg one at evening meal Hydrocodone-APAP 5-325 mg q4hrs prn  Please send refills to [facility].  Thank you,  April D Calhoun, [place] Pharmacist Assistant [phone]

## 2021-11-18 NOTE — Telephone Encounter (Signed)
Patient has an upcoming delivery with [facility]. She will need a 90 DS of the following medications to complete her order:   Glipizide ER 5 mg one tablet at breakfast  Metformin 1000 mg one at evening meal  Hydrocodone-APAP 5-325 mg q4hrs prn   Dr. Dennard Schaumann, Received this as a staff message. I refilled the Glipizide and Metformin but I am unable to refill Hydrocodone. Thank you.

## 2021-11-24 ENCOUNTER — Other Ambulatory Visit: Payer: Self-pay | Admitting: Family Medicine

## 2021-12-14 ENCOUNTER — Other Ambulatory Visit: Payer: Self-pay | Admitting: Family Medicine

## 2021-12-14 DIAGNOSIS — J441 Chronic obstructive pulmonary disease with (acute) exacerbation: Secondary | ICD-10-CM

## 2021-12-18 ENCOUNTER — Telehealth: Payer: Self-pay | Admitting: Pharmacist

## 2021-12-18 ENCOUNTER — Other Ambulatory Visit: Payer: Self-pay

## 2021-12-18 NOTE — Progress Notes (Signed)
Chronic Care Management Pharmacy Assistant   Name: Briana Barr  MRN: [id number] DOB: 1941/01/28    Reason for Encounter: Medication Coordination Call    Recent office visits:  None  Recent consult visits:  None  Hospital visits:  None in previous 6 months  Medications: Outpatient Encounter Medications as of 12/18/2021  Medication Sig   acetaZOLAMIDE (DIAMOX) 250 MG tablet TAKE ONE TABLET BY MOUTH EVERY MORNING   albuterol (PROAIR HFA) 108 (90 Base) MCG/ACT inhaler Inhale 2 puffs into the lungs every 6 (six) hours as needed for wheezing or shortness of breath.   albuterol (PROVENTIL) (2.5 MG/3ML) 0.083% nebulizer solution Take 3 mLs (2.5 mg total) by nebulization every 6 (six) hours as needed for wheezing or shortness of breath.   aspirin 81 MG tablet Take 81 mg by mouth daily.   Cholecalciferol (VITAMIN D3) 250 MCG (10000 UT) capsule Take 1 capsule (10,000 Units total) by mouth daily.   ELDERBERRY PO Take 1 each by mouth daily.   escitalopram (LEXAPRO) 10 MG tablet Take 1 tablet (10 mg total) by mouth daily.   Fluticasone-Umeclidin-Vilant (TRELEGY ELLIPTA) 100-62.5-25 MCG/ACT AEPB Inhale 1 Dose into the lungs daily.   glipiZIDE (GLUCOTROL XL) 5 MG 24 hr tablet TAKE ONE TABLET BY MOUTH EVERY MORNING   HYDROcodone-acetaminophen (NORCO/VICODIN) 5-325 MG tablet TAKE ONE TABLET BY MOUTH every SIX hours AS NEEDED FOR moderate pain   ipratropium-albuterol (DUONEB) 0.5-2.5 (3) MG/3ML SOLN USE 1 VIAL(3ML) VIA NEBULIZER EVERY 6 HOURS   losartan (COZAAR) 50 MG tablet Take 1 tablet (50 mg total) by mouth daily.   metFORMIN (GLUCOPHAGE) 1000 MG tablet TAKE ONE TABLET BY MOUTH TWICE DAILY WITH FOOD   metFORMIN (GLUCOPHAGE) 1000 MG tablet Take 1 tablet (1,000 mg total) by mouth 2 (two) times daily with a meal.   Multiple Vitamins-Minerals (CENTRUM SILVER 50+WOMEN) TABS Take 1 tablet by mouth daily.   omeprazole (PRILOSEC) 20 MG capsule TAKE 1 CAPSULE BY MOUTH TWICE A DAY BEFORE A MEAL    rosuvastatin (CRESTOR) 20 MG tablet TAKE ONE TABLET BY MOUTH EVERY EVENING   sitaGLIPtin (JANUVIA) 100 MG tablet Take 1 tablet (100 mg total) by mouth daily.   No facility-administered encounter medications on file as of 12/18/2021.   Reviewed chart for medication changes ahead of medication coordination call.  No OVs, Consults, or hospital visits since last care coordination call/Pharmacist visit.  No medication changes indicated.  BP Readings from Last 3 Encounters:  05/13/21 126/65  04/18/21 120/80  04/17/21 138/69    Lab Results  Component Value Date   HGBA1C 7.7 (H) 12/09/2020     Patient obtains medications through Adherence Packaging  30 Days   Last adherence delivery included:  Acetazolamide 250 mg one tablet at breakfast Glipizide ER 5 mg one tablet at breakfast Metformin 100 mg one at breakfast and one at evening meal Rosuvastatin 20 mg one at evening meal Escitalopram 10 mg one at breakfast Losartan Potassium 50 mg one at breakfast Omeprazole 20 mg one at breakfast, one at evening meal Januvia 100 mg one at breakfast Trelegy 100 mcg one puff daily Hydrocodone-APAP 5-325 mg four times daily prn  Patient is due for next adherence delivery on: 12/30/2021. Called patient and reviewed medications and coordinated delivery.  This delivery to include: Acetazolamide 250 mg one tablet at breakfast Glipizide ER 5 mg one tablet at breakfast Metformin 100 mg one at breakfast and one at evening meal Rosuvastatin 20 mg one at evening meal Escitalopram 10 mg one  at breakfast Losartan Potassium 50 mg one at breakfast Omeprazole 20 mg one at breakfast, one at evening meal Januvia 100 mg one at breakfast Trelegy 100 mcg one puff daily Hydrocodone-APAP 5-325 mg four times daily prn  Patient needs refills for Hydrocodone.  Confirmed delivery date of 12/30/2021, advised patient that pharmacy will contact them the morning of delivery.  Care Gaps: Medicare Annual Wellness:  Completed 03/24/2021 Ophthalmology Exam: Overdue since 12/31/2018 Foot Exam: Overdue - never done Hemoglobin A1C: 7.7% on 12/09/2020 Colonoscopy: Aged out, last completed 11/23/2016 Dexa Scan: Completed Mammogram: Aged out  Future Appointments  Date Time Provider [place]  04/02/2022 10:30 AM [facility] HEALTH ADVISOR [facility]   April D Calhoun, [place] Pharmacist Assistant [phone]

## 2021-12-18 NOTE — Telephone Encounter (Signed)
-----   Message from April D Calhoun sent at 12/18/2021  2:05 PM EDT ----- Regarding: Rx refill request Patient has an upcoming delivery with [facility]. She will need a refill on Hydrocodone-APAP 5-325 mg four times daily prn to complete her order.  Please send refills to [facility].  Thank you,  April D Calhoun, [place] Pharmacist Assistant [phone]

## 2021-12-18 NOTE — Telephone Encounter (Signed)
Requested medication (s) are due for refill today: yes  Requested medication (s) are on the active medication list: yes  Last refill:   90 tablet 0 11/24/2021    Future visit scheduled: no  Notes to clinic:  Unable to refill per protocol, cannot delegate.    Requested Prescriptions  Pending Prescriptions Disp Refills   HYDROcodone-acetaminophen (NORCO/VICODIN) 5-325 MG tablet 90 tablet 0    Sig: TAKE ONE TABLET BY MOUTH every SIX hours AS NEEDED FOR moderate pain     Not Delegated - Analgesics:  Opioid Agonist Combinations Failed - 12/18/2021  2:34 PM      Failed - This refill cannot be delegated      Failed - Urine Drug Screen completed in last 360 days      Failed - Valid encounter within last 3 months    Recent Outpatient Visits           8 months ago Community acquired pneumonia of left lower lobe of lung   [employer] [name], MD   1 year ago Diabetes mellitus type 2 in nonobese [facility])   [place] [name], MD   1 year ago Panlobular emphysema [facility])   [place] [name], MD   1 year ago Shortness of breath   [place] [name], MD   2 years ago Chronic respiratory failure with hypoxia [facility])   [facility] Pickard, Cammie Mcgee, MD

## 2021-12-18 NOTE — Addendum Note (Signed)
Addended by: Hoyle Barr on: 12/18/2021 02:12 PM   Modules accepted: Orders

## 2021-12-24 ENCOUNTER — Other Ambulatory Visit: Payer: Self-pay | Admitting: Family Medicine

## 2022-01-19 ENCOUNTER — Other Ambulatory Visit: Payer: Self-pay

## 2022-01-19 ENCOUNTER — Telehealth: Payer: Self-pay | Admitting: Pharmacist

## 2022-01-19 MED ORDER — ESCITALOPRAM OXALATE 10 MG PO TABS: 10.0000 mg | ORAL_TABLET | Freq: Every day | ORAL | 0 refills | Status: DC

## 2022-01-19 NOTE — Telephone Encounter (Signed)
Requested Prescriptions  Pending Prescriptions Disp Refills  . escitalopram (LEXAPRO) 10 MG tablet 90 tablet 0    Sig: Take 1 tablet (10 mg total) by mouth daily.     Psychiatry:  Antidepressants - SSRI Failed - 01/19/2022  1:07 PM      Failed - Valid encounter within last 6 months    Recent Outpatient Visits          9 months ago Community acquired pneumonia of left lower lobe of lung   [place] Pickard, Cammie Mcgee, MD   1 year ago Diabetes mellitus type 2 in nonobese [facility])   [place] [name], MD   1 year ago Panlobular emphysema [facility])   [place] [name], MD   1 year ago Shortness of breath   [place] [name], MD   2 years ago Chronic respiratory failure with hypoxia [facility])   [place], Cammie Mcgee, MD      Future Appointments            In 3 days Pickard, Cammie Mcgee, MD [place], [facility]           . HYDROcodone-acetaminophen (NORCO/VICODIN) 5-325 MG tablet 90 tablet 0     Not Delegated - Analgesics:  Opioid Agonist Combinations Failed - 01/19/2022  1:07 PM      Failed - This refill cannot be delegated      Failed - Urine Drug Screen completed in last 360 days      Failed - Valid encounter within last 3 months    Recent Outpatient Visits          9 months ago Community acquired pneumonia of left lower lobe of lung   [place] Pickard, Cammie Mcgee, MD   1 year ago Diabetes mellitus type 2 in nonobese [facility])   [place] [name], MD   1 year ago Panlobular emphysema [facility])   [place] [name], MD   1 year ago Shortness of breath   [place] [name], MD   2 years ago Chronic respiratory failure with hypoxia ([place])   [place] Pickard, Cammie Mcgee, MD      Future Appointments            In 3 days Pickard, Cammie Mcgee, MD [place]

## 2022-01-19 NOTE — Telephone Encounter (Signed)
Requested medication (s) are due for refill today: Yes  Requested medication (s) are on the active medication list: Yes  Last refill:  12/25/21  Future visit scheduled:Yes  Notes to clinic:  Unable to refill per protocol, cannot delegate.      Requested Prescriptions  Pending Prescriptions Disp Refills   HYDROcodone-acetaminophen (NORCO/VICODIN) 5-325 MG tablet 90 tablet 0     Not Delegated - Analgesics:  Opioid Agonist Combinations Failed - 01/19/2022  1:07 PM      Failed - This refill cannot be delegated      Failed - Urine Drug Screen completed in last 360 days      Failed - Valid encounter within last 3 months    Recent Outpatient Visits           9 months ago Community acquired pneumonia of left lower lobe of lung   [place] Pickard, Cammie Mcgee, MD   1 year ago Diabetes mellitus type 2 in nonobese [facility])   [place] Pickard, Cammie Mcgee, MD   1 year ago Panlobular emphysema ([place])   [place] [name], MD   1 year ago Shortness of breath   [place] [name], MD   2 years ago Chronic respiratory failure with hypoxia [facility])   [place] Pickard, Cammie Mcgee, MD       Future Appointments             In 3 days [name], MD [place], [facility]            Signed Prescriptions Disp Refills   escitalopram (LEXAPRO) 10 MG tablet 90 tablet 0    Sig: Take 1 tablet (10 mg total) by mouth daily.     Psychiatry:  Antidepressants - SSRI Failed - 01/19/2022  1:07 PM      Failed - Valid encounter within last 6 months    Recent Outpatient Visits           9 months ago Community acquired pneumonia of left lower lobe of lung   [place] Pickard, Cammie Mcgee, MD   1 year ago Diabetes mellitus type 2 in nonobese [facility])   [place] [name], MD   1 year ago Panlobular emphysema [facility])   [place] [name], MD   1 year ago Shortness of breath   [place] [name], MD   2 years ago Chronic respiratory failure with hypoxia [facility])   [place] Pickard, Cammie Mcgee, MD       Future Appointments             In 3 days Pickard, Cammie Mcgee, MD [place]

## 2022-01-19 NOTE — Telephone Encounter (Signed)
Patient called and spoke to [place], daughter-in-law on DPR about scheduling a physical. She says she was going to call because the patient is having problems with incontinence, continuing deterioration on her cognitive ability and they wanted to talk to Dr. Dennard Schaumann about that and some options. She asked would she be able to discuss this at the physical, I advised probably not, so OV appointment scheduled for 01/22/22 at 1400. Advised to schedule for physical after this visit while in the office.

## 2022-01-19 NOTE — Telephone Encounter (Signed)
-----   Message from April D Calhoun sent at 01/19/2022 11:46 AM EDT ----- Regarding: Rx refill request Patient has an upcoming delivery with [facility]. She will need a 30 DS of the following medications to complete her order:  Hydrocodone-APAP 5-325 mgfour times dailyprn Escitalopram 10 mg one at breakfast  Please send refills to [facility].  Thank you,  April D Calhoun, [place] Pharmacist Assistant [phone]

## 2022-01-19 NOTE — Progress Notes (Signed)
Chronic Care Management Pharmacy Assistant   Name: Briana Barr  MRN: [id number] DOB: 1940-07-08   Reason for Encounter: Medication Coordination Call    Recent office visits:  None  Recent consult visits:  None  Hospital visits:  None in previous 6 months  Medications: Outpatient Encounter Medications as of 01/19/2022  Medication Sig   acetaZOLAMIDE (DIAMOX) 250 MG tablet TAKE ONE TABLET BY MOUTH EVERY MORNING   albuterol (PROAIR HFA) 108 (90 Base) MCG/ACT inhaler Inhale 2 puffs into the lungs every 6 (six) hours as needed for wheezing or shortness of breath.   albuterol (PROVENTIL) (2.5 MG/3ML) 0.083% nebulizer solution Take 3 mLs (2.5 mg total) by nebulization every 6 (six) hours as needed for wheezing or shortness of breath.   aspirin 81 MG tablet Take 81 mg by mouth daily.   Cholecalciferol (VITAMIN D3) 250 MCG (10000 UT) capsule Take 1 capsule (10,000 Units total) by mouth daily.   ELDERBERRY PO Take 1 each by mouth daily.   escitalopram (LEXAPRO) 10 MG tablet Take 1 tablet (10 mg total) by mouth daily.   Fluticasone-Umeclidin-Vilant (TRELEGY ELLIPTA) 100-62.5-25 MCG/ACT AEPB Inhale 1 Dose into the lungs daily.   glipiZIDE (GLUCOTROL XL) 5 MG 24 hr tablet TAKE ONE TABLET BY MOUTH EVERY MORNING   HYDROcodone-acetaminophen (NORCO/VICODIN) 5-325 MG tablet TAKE ONE TABLET BY MOUTH every SIX hours AS NEEDED FOR moderate pain   ipratropium-albuterol (DUONEB) 0.5-2.5 (3) MG/3ML SOLN USE 1 VIAL(3ML) VIA NEBULIZER EVERY 6 HOURS   losartan (COZAAR) 50 MG tablet Take 1 tablet (50 mg total) by mouth daily.   metFORMIN (GLUCOPHAGE) 1000 MG tablet TAKE ONE TABLET BY MOUTH TWICE DAILY WITH FOOD   metFORMIN (GLUCOPHAGE) 1000 MG tablet Take 1 tablet (1,000 mg total) by mouth 2 (two) times daily with a meal.   Multiple Vitamins-Minerals (CENTRUM SILVER 50+WOMEN) TABS Take 1 tablet by mouth daily.   omeprazole (PRILOSEC) 20 MG capsule TAKE 1 CAPSULE BY MOUTH TWICE A DAY BEFORE A MEAL    rosuvastatin (CRESTOR) 20 MG tablet TAKE ONE TABLET BY MOUTH EVERY EVENING   sitaGLIPtin (JANUVIA) 100 MG tablet Take 1 tablet (100 mg total) by mouth daily.   No facility-administered encounter medications on file as of 01/19/2022.   Reviewed chart for medication changes ahead of medication coordination call.  No OVs, Consults, or hospital visits since last care coordination call/Pharmacist visit.   No medication changes indicated.  BP Readings from Last 3 Encounters:  05/13/21 126/65  04/18/21 120/80  04/17/21 138/69    Lab Results  Component Value Date   HGBA1C 7.7 (H) 12/09/2020     Patient obtains medications through Adherence Packaging  90 Days   Last adherence delivery included:  Acetazolamide 250 mg one tablet at breakfast Glipizide ER 5 mg one tablet at breakfast Metformin 100 mg one at breakfast and one at evening meal Rosuvastatin 20 mg one at evening meal Escitalopram 10 mg one at breakfast Losartan Potassium 50 mg one at breakfast Omeprazole 20 mg one at breakfast, one at evening meal Januvia 100 mg one at breakfast Trelegy 100 mcg one puff daily Hydrocodone-APAP 5-325 mg four times daily prn  Patient is due for next adherence delivery on: 01/28/2022 Called patient and reviewed medications and coordinated delivery.  This delivery to include: Acetazolamide 250 mg one tablet at breakfast Glipizide ER 5 mg one tablet at breakfast Metformin 100 mg one at breakfast and one at evening meal Rosuvastatin 20 mg one at evening meal Escitalopram 10 mg one  at breakfast Losartan Potassium 50 mg one at breakfast Omeprazole 20 mg one at breakfast, one at evening meal Januvia 100 mg one at breakfast Trelegy 100 mcg one puff daily Hydrocodone-APAP 5-325 mg four times daily prn  Patient needs refills for: Hydrocodone-APAP 5-325 mg four times daily prn Escitalopram 10 mg one at breakfast -Rx refill request sent to [facility].  Confirmed delivery date of 01/28/2022, advised  patient that pharmacy will contact them the morning of delivery.  Care Gaps: Medicare Annual Wellness: Completed 03/24/2021 Ophthalmology Exam: Overdue since 12/31/2018 Foot Exam: Overdue - never done Hemoglobin A1C: 7.7% on 12/09/2020 Colonoscopy: Aged out, last completed 11/23/2016 Dexa Scan: Completed Mammogram: Aged out  Future Appointments  Date Time Provider [place]  04/02/2022 10:30 AM [facility] HEALTH ADVISOR [facility]   April D Calhoun, [place] Pharmacist Assistant [phone]

## 2022-01-20 MED ORDER — HYDROCODONE-ACETAMINOPHEN 5-325 MG PO TABS: ORAL_TABLET | ORAL | 0 refills | Status: DC

## 2022-01-22 ENCOUNTER — Other Ambulatory Visit: Payer: Self-pay | Admitting: Family Medicine

## 2022-01-22 ENCOUNTER — Ambulatory Visit (INDEPENDENT_AMBULATORY_CARE_PROVIDER_SITE_OTHER): Payer: Medicare Other | Admitting: Family Medicine

## 2022-01-22 ENCOUNTER — Ambulatory Visit
Admission: RE | Admit: 2022-01-22 | Discharge: 2022-01-22 | Disposition: A | Discharge status: Home / Self Care | Payer: Medicare Other | Source: Ambulatory Visit | Attending: Family Medicine | Admitting: Family Medicine

## 2022-01-22 VITALS — BP 120/72 | HR 81 | Ht 66.0 in | Wt 160.4 lb

## 2022-01-22 DIAGNOSIS — R051 Acute cough: Secondary | ICD-10-CM | POA: Diagnosis not present

## 2022-01-22 DIAGNOSIS — E119 Type 2 diabetes mellitus without complications: Secondary | ICD-10-CM

## 2022-01-22 DIAGNOSIS — R32 Unspecified urinary incontinence: Secondary | ICD-10-CM

## 2022-01-22 DIAGNOSIS — R0989 Other specified symptoms and signs involving the circulatory and respiratory systems: Secondary | ICD-10-CM | POA: Diagnosis not present

## 2022-01-22 DIAGNOSIS — R918 Other nonspecific abnormal finding of lung field: Secondary | ICD-10-CM | POA: Diagnosis not present

## 2022-01-22 NOTE — Progress Notes (Signed)
Subjective:    Patient ID: Briana Barr, female    DOB: Oct 24, 1940, 81 y.o.   MRN: [id number]  Patient is a very pleasant 81 year old Caucasian female who has end-stage COPD.  She is currently on 6 L of oxygen daily via nasal cannula.  Patient seems more confused to me today.  She has a difficult time answering questions.  She seems confused, questions.  The family mentions that she has been more dizzy recently and also complaining of some blurry vision.  She is also having more frequent nighttime incontinence.  Today on exam she has some CVA tenderness on the left side and she also has some tenderness to palpation over the bladder.  She is unable to provide a good history as to whether she is having dysuria but the family does mention that she is going to the bathroom 7 or 8 times a day.  She always has a cough.  However today on examination she has right crackles in the base and midlung fields on the right.  Therefore am concerned that the confusion and dizziness may be related to infection either urinary tract infection or an early pneumonia or possibly an aspiration pneumonia.  She also has a history of diabetes and the polyuria could be due to uncontrolled hyperglycemia Past Medical History:  Diagnosis Date   Barrett esophagus    Colon polyps    benign   COPD (chronic obstructive pulmonary disease) (HCC)    Duodenal ulcer    DVT (deep venous thrombosis) (HCC)    left leg x 2   Gallstones    GERD (gastroesophageal reflux disease)    Glaucoma    Hyperlipidemia    Ischemic colitis ([place])    Osteoporosis    Pneumonia    hx of x 2 as a child    Renal cyst, right    Retinopathy, background, proliferative    Past Surgical History:  Procedure Laterality Date   cervical vertebral fusion     c5-6, 6-7   CHOLECYSTECTOMY     COLONOSCOPY WITH PROPOFOL N/A 11/23/2016   Procedure: COLONOSCOPY WITH PROPOFOL;  Surgeon: Ladene Artist, MD;  Location: [facility];  Service: Endoscopy;   Laterality: N/A;   ESOPHAGOGASTRODUODENOSCOPY (EGD) WITH PROPOFOL N/A 11/23/2016   Procedure: ESOPHAGOGASTRODUODENOSCOPY (EGD) WITH PROPOFOL;  Surgeon: Ladene Artist, MD;  Location: [facility];  Service: Endoscopy;  Laterality: N/A;   FEMORAL-POPLITEAL BYPASS GRAFT     IR RADIOLOGIST EVAL & MGMT  06/06/2019   IR RADIOLOGIST EVAL & MGMT  07/02/2020   IR RADIOLOGIST EVAL & MGMT  07/08/2021    Current Outpatient Medications on File Prior to Visit  Medication Sig Dispense Refill   acetaZOLAMIDE (DIAMOX) 250 MG tablet TAKE ONE TABLET BY MOUTH EVERY MORNING 90 tablet 3   albuterol (PROAIR HFA) 108 (90 Base) MCG/ACT inhaler Inhale 2 puffs into the lungs every 6 (six) hours as needed for wheezing or shortness of breath. 18 g 1   albuterol (PROVENTIL) (2.5 MG/3ML) 0.083% nebulizer solution Take 3 mLs (2.5 mg total) by nebulization every 6 (six) hours as needed for wheezing or shortness of breath. 150 mL 1   aspirin 81 MG tablet Take 81 mg by mouth daily.     Cholecalciferol (VITAMIN D3) 250 MCG (10000 UT) capsule Take 1 capsule (10,000 Units total) by mouth daily. 30 capsule    ELDERBERRY PO Take 1 each by mouth daily.     escitalopram (LEXAPRO) 10 MG tablet Take 1 tablet (10 mg total)  by mouth daily. 90 tablet 0   Fluticasone-Umeclidin-Vilant (TRELEGY ELLIPTA) 100-62.5-25 MCG/ACT AEPB Inhale 1 Dose into the lungs daily. 1 each 11   glipiZIDE (GLUCOTROL XL) 5 MG 24 hr tablet TAKE ONE TABLET BY MOUTH EVERY MORNING 30 tablet 2   HYDROcodone-acetaminophen (NORCO/VICODIN) 5-325 MG tablet TAKE ONE TABLET BY MOUTH every SIX hours AS NEEDED FOR moderate pain 90 tablet 0   ipratropium-albuterol (DUONEB) 0.5-2.5 (3) MG/3ML SOLN USE 1 VIAL(3ML) VIA NEBULIZER EVERY 6 HOURS 360 mL 1   losartan (COZAAR) 50 MG tablet Take 1 tablet (50 mg total) by mouth daily. 90 tablet 3   metFORMIN (GLUCOPHAGE) 1000 MG tablet TAKE ONE TABLET BY MOUTH TWICE DAILY WITH FOOD 60 tablet 2   metFORMIN (GLUCOPHAGE) 1000 MG tablet Take  1 tablet (1,000 mg total) by mouth 2 (two) times daily with a meal. 180 tablet 3   Multiple Vitamins-Minerals (CENTRUM SILVER 50+WOMEN) TABS Take 1 tablet by mouth daily. 90 tablet    omeprazole (PRILOSEC) 20 MG capsule TAKE 1 CAPSULE BY MOUTH TWICE A DAY BEFORE A MEAL 180 capsule 3   rosuvastatin (CRESTOR) 20 MG tablet TAKE ONE TABLET BY MOUTH EVERY EVENING 90 tablet 3   sitaGLIPtin (JANUVIA) 100 MG tablet Take 1 tablet (100 mg total) by mouth daily. 90 tablet 3   No current facility-administered medications on file prior to visit.   Allergies  Allergen Reactions   Vioxx [Rofecoxib] Swelling   Social History   Socioeconomic History   Marital status: Widowed    Spouse name: Not on file   Number of children: 3   Years of education: Not on file   Highest education level: Not on file  Occupational History   Occupation: retired  Tobacco Use   Smoking status: Every Day    Packs/day: 1.00    Types: Cigarettes   Smokeless tobacco: Never   Tobacco comments:    started age 14  Vaping Use   Vaping Use: Never used  Substance and Sexual Activity   Alcohol use: No   Drug use: No   Sexual activity: Not Currently  Other Topics Concern   Not on file  Social History Narrative   Not on file   Social Determinants of Health   Financial Resource Strain: Low Risk  (03/27/2021)   Overall Financial Resource Strain (CARDIA)    Difficulty of Paying Living Expenses: Not hard at all  Food Insecurity: No Food Insecurity (03/27/2021)   Hunger Vital Sign    Worried About Running Out of Food in the Last Year: Never true    [place] in the Last Year: Never true  Transportation Needs: No Transportation Needs (03/27/2021)   PRAPARE - Hydrologist (Medical): No    Lack of Transportation (Non-Medical): No  Physical Activity: Insufficiently Active (03/27/2021)   Exercise Vital Sign    Days of Exercise per Week: 5 days    Minutes of Exercise per Session: 20 min   Stress: No Stress Concern Present (03/27/2021)   [place]    Feeling of Stress : Not at all  Social Connections: Moderately Integrated (03/27/2021)   Social Connection and Isolation Panel [NHANES]    Frequency of Communication with Friends and Family: More than three times a week    Frequency of Social Gatherings with Friends and Family: More than three times a week    Attends Religious Services: 1 to 4 times per year  Active Member of Clubs or Organizations: Yes    Attends Archivist Meetings: Never    Marital Status: Widowed  Intimate Partner Violence: Not At Risk (03/27/2021)   Humiliation, Afraid, Rape, and Kick questionnaire    Fear of Current or Ex-Partner: No    Emotionally Abused: No    Physically Abused: No    Sexually Abused: No   Family History  Problem Relation Age of Onset   Other Father        some sort of blood cancer-had to get transfusions   Stomach cancer Maternal Grandmother    Stomach cancer Maternal Grandfather       Review of Systems  All other systems reviewed and are negative.      Objective:   Physical Exam Vitals reviewed.  Constitutional:      General: She is not in acute distress.    Appearance: She is well-developed. She is not diaphoretic.  HENT:     Head: Normocephalic and atraumatic.     Right Ear: External ear normal.     Left Ear: External ear normal.     Nose: Nose normal.     Mouth/Throat:     Pharynx: No oropharyngeal exudate.  Eyes:     General: No scleral icterus.       Right eye: No discharge.        Left eye: No discharge.     Conjunctiva/sclera: Conjunctivae normal.  Neck:     Thyroid: No thyromegaly.     Vascular: No JVD.  Cardiovascular:     Rate and Rhythm: Normal rate and regular rhythm.     Heart sounds: Normal heart sounds. No murmur heard.    No friction rub. No gallop.  Pulmonary:     Effort: Pulmonary effort is normal.      Breath sounds: Decreased air movement present. Examination of the left-lower field reveals rales. Decreased breath sounds, wheezing and rales present.    Abdominal:     General: Bowel sounds are normal. There is no distension.     Palpations: Abdomen is soft. There is no mass.     Tenderness: There is abdominal tenderness. There is left CVA tenderness. There is no guarding or rebound.    Musculoskeletal:     Cervical back: Neck supple.  Lymphadenopathy:     Cervical: No cervical adenopathy.  Skin:    General: Skin is warm.     Coloration: Skin is not pale.     Findings: No erythema or rash.  Neurological:     Mental Status: She is alert and oriented to person, place, and time.     Cranial Nerves: No cranial nerve deficit.     Motor: No abnormal muscle tone.     Coordination: Coordination normal.     Deep Tendon Reflexes: Reflexes are normal and symmetric.  Psychiatric:        Behavior: Behavior normal.        Thought Content: Thought content normal.        Judgment: Judgment normal.           Assessment & Plan:  Urinary incontinence, unspecified type - Plan: Urinalysis, Routine w reflex microscopic  Diabetes mellitus type 2 in nonobese (HCC) - Plan: Hemoglobin A1c, COMPLETE METABOLIC PANEL WITH GFR, CBC with Differential/Platelet  Acute cough - Plan: DG Chest 2 View Patient has end-stage COPD however she seems more confused today, she is having more incontinence, and she has an abnormal physical exam.  Begin by  obtaining a chest x-ray to evaluate for community-acquired pneumonia.  Get a urinalysis to evaluate for urinary tract infection.  Check a CBC a CMP and an A1c.  If all these tests are normal, consider Myrbetriq at night for urinary incontinence due to overactive bladder and I would attribute the confusion to worsening cognitive decline related to age.

## 2022-01-23 LAB — COMPLETE METABOLIC PANEL WITH GFR
AG Ratio: 1.6 (calc) (ref 1.0–2.5)
ALT: 34 U/L — ABNORMAL HIGH (ref 6–29)
AST: 27 U/L (ref 10–35)
Albumin: 4.4 g/dL (ref 3.6–5.1)
Alkaline phosphatase (APISO): 64 U/L (ref 37–153)
BUN: 25 mg/dL (ref 7–25)
CO2: 26 mmol/L (ref 20–32)
Calcium: 10 mg/dL (ref 8.6–10.4)
Chloride: 103 mmol/L (ref 98–110)
Creat: 0.65 mg/dL (ref 0.60–0.95)
Globulin: 2.8 g/dL (calc) (ref 1.9–3.7)
Glucose, Bld: 130 mg/dL — ABNORMAL HIGH (ref 65–99)
Potassium: 4.6 mmol/L (ref 3.5–5.3)
Sodium: 140 mmol/L (ref 135–146)
Total Bilirubin: 0.3 mg/dL (ref 0.2–1.2)
Total Protein: 7.2 g/dL (ref 6.1–8.1)
eGFR: 89 mL/min/{1.73_m2} (ref >=60)

## 2022-01-23 LAB — CBC WITH DIFFERENTIAL/PLATELET
Absolute Monocytes: 902 cells/uL (ref 200–950)
Basophils Absolute: 101 cells/uL (ref 0–200)
Basophils Relative: 1.1 %
Eosinophils Absolute: 221 cells/uL (ref 15–500)
Eosinophils Relative: 2.4 %
HCT: 40.5 % (ref 35.0–45.0)
Hemoglobin: 12.8 g/dL (ref 11.7–15.5)
Lymphs Abs: 1122 cells/uL (ref 850–3900)
MCH: 27.6 pg (ref 27.0–33.0)
MCHC: 31.6 g/dL — ABNORMAL LOW (ref 32.0–36.0)
MCV: 87.3 fL (ref 80.0–100.0)
MPV: 11.3 fL (ref 7.5–12.5)
Monocytes Relative: 9.8 %
Neutro Abs: 6854 cells/uL (ref 1500–7800)
Neutrophils Relative %: 74.5 %
Platelets: 521 10*3/uL — ABNORMAL HIGH (ref 140–400)
RBC: 4.64 10*6/uL (ref 3.80–5.10)
RDW: 13.9 % (ref 11.0–15.0)
Total Lymphocyte: 12.2 %
WBC: 9.2 10*3/uL (ref 3.8–10.8)

## 2022-01-23 LAB — HEMOGLOBIN A1C
Hgb A1c MFr Bld: 7.4 % of total Hgb — ABNORMAL HIGH (ref <5.7)
Mean Plasma Glucose: 166 mg/dL
eAG (mmol/L): 9.2 mmol/L

## 2022-01-26 ENCOUNTER — Other Ambulatory Visit: Payer: Self-pay

## 2022-01-26 DIAGNOSIS — R32 Unspecified urinary incontinence: Secondary | ICD-10-CM | POA: Diagnosis not present

## 2022-01-27 ENCOUNTER — Other Ambulatory Visit: Payer: Self-pay | Admitting: Family Medicine

## 2022-01-27 LAB — URINALYSIS, ROUTINE W REFLEX MICROSCOPIC
Bilirubin Urine: NEGATIVE
Glucose, UA: NEGATIVE
Hyaline Cast: NONE SEEN /LPF
Ketones, ur: NEGATIVE
Nitrite: POSITIVE — AB
Specific Gravity, Urine: 1.019 (ref 1.001–1.035)
WBC, UA: >=60 /HPF — AB (ref 0–5)
pH: 5.5 (ref 5.0–8.0)

## 2022-01-27 LAB — MICROSCOPIC MESSAGE

## 2022-01-27 MED ORDER — CEPHALEXIN 500 MG PO CAPS: 500.0000 mg | ORAL_CAPSULE | Freq: Three times a day (TID) | ORAL | 0 refills | Status: DC

## 2022-02-12 ENCOUNTER — Other Ambulatory Visit: Payer: Self-pay | Admitting: Family Medicine

## 2022-02-16 ENCOUNTER — Telehealth: Payer: Self-pay | Admitting: Pharmacist

## 2022-02-16 ENCOUNTER — Other Ambulatory Visit: Payer: Self-pay

## 2022-02-16 NOTE — Progress Notes (Signed)
Chronic Care Management Pharmacy Assistant   Name: Briana Barr  MRN: [id number] DOB: Briana Barr 11, 1942   Reason for Encounter: Medication Coordination Call   Recent office visits:  01/22/2022 OV (PCP) [name], MD; consider Myrbetriq at night for urinary incontinence due to overactive bladder and I would attribute the confusion to worsening cognitive decline related to age.   Recent consult visits:  None  Hospital visits:  None in previous 6 months  Medications: Outpatient Encounter Medications as of 02/16/2022  Medication Sig   acetaZOLAMIDE (DIAMOX) 250 MG tablet TAKE ONE TABLET BY MOUTH EVERY MORNING   albuterol (PROAIR HFA) 108 (90 Base) MCG/ACT inhaler Inhale 2 puffs into the lungs every 6 (six) hours as needed for wheezing or shortness of breath.   albuterol (PROVENTIL) (2.5 MG/3ML) 0.083% nebulizer solution Take 3 mLs (2.5 mg total) by nebulization every 6 (six) hours as needed for wheezing or shortness of breath.   aspirin 81 MG tablet Take 81 mg by mouth daily.   cephALEXin (KEFLEX) 500 MG capsule Take 1 capsule (500 mg total) by mouth 3 (three) times daily.   Cholecalciferol (VITAMIN D3) 250 MCG (10000 UT) capsule Take 1 capsule (10,000 Units total) by mouth daily.   ELDERBERRY PO Take 1 each by mouth daily.   escitalopram (LEXAPRO) 10 MG tablet Take 1 tablet (10 mg total) by mouth daily.   Fluticasone-Umeclidin-Vilant (TRELEGY ELLIPTA) 100-62.5-25 MCG/ACT AEPB Inhale 1 Dose into the lungs daily.   glipiZIDE (GLUCOTROL XL) 5 MG 24 hr tablet TAKE ONE TABLET BY MOUTH EVERY MORNING   HYDROcodone-acetaminophen (NORCO/VICODIN) 5-325 MG tablet TAKE ONE TABLET BY MOUTH every SIX hours AS NEEDED FOR moderate pain   ipratropium-albuterol (DUONEB) 0.5-2.5 (3) MG/3ML SOLN USE 1 VIAL(3ML) VIA NEBULIZER EVERY 6 HOURS   losartan (COZAAR) 50 MG tablet Take 1 tablet (50 mg total) by mouth daily.   metFORMIN (GLUCOPHAGE) 1000 MG tablet TAKE ONE TABLET BY MOUTH TWICE DAILY WITH FOOD    metFORMIN (GLUCOPHAGE) 1000 MG tablet Take 1 tablet (1,000 mg total) by mouth 2 (two) times daily with a meal.   Multiple Vitamins-Minerals (CENTRUM SILVER 50+WOMEN) TABS Take 1 tablet by mouth daily.   omeprazole (PRILOSEC) 20 MG capsule TAKE 1 CAPSULE BY MOUTH TWICE A DAY BEFORE A MEAL   rosuvastatin (CRESTOR) 20 MG tablet TAKE ONE TABLET BY MOUTH EVERY EVENING   sitaGLIPtin (JANUVIA) 100 MG tablet Take 1 tablet (100 mg total) by mouth daily.   No facility-administered encounter medications on file as of 02/16/2022.   Reviewed chart for medication changes ahead of medication coordination call.  No medication changes indicated.  BP Readings from Last 3 Encounters:  01/22/22 120/72  05/13/21 126/65  04/18/21 120/80    Lab Results  Component Value Date   HGBA1C 7.4 (H) 01/22/2022     Patient obtains medications through Adherence Packaging  30 Days   Last adherence delivery included: Acetazolamide 250 mg one tablet at breakfast Glipizide ER 5 mg one tablet at breakfast Metformin 100 mg one at breakfast and one at evening meal Rosuvastatin 20 mg one at evening meal Escitalopram 10 mg one at breakfast Losartan Potassium 50 mg one at breakfast Omeprazole 20 mg one at breakfast, one at evening meal Januvia 100 mg one at breakfast Trelegy 100 mcg one puff daily Hydrocodone-APAP 5-325 mg four times daily prn  Patient is due for next adherence delivery on: 02/26/2022. Called patient and reviewed medications and coordinated delivery.  This delivery to include: Acetazolamide 250 mg  one tablet at breakfast Glipizide ER 5 mg one tablet at breakfast Metformin 100 mg one at breakfast and one at evening meal Rosuvastatin 20 mg one at evening meal Escitalopram 10 mg one at breakfast Losartan Potassium 50 mg one at breakfast Omeprazole 20 mg one at breakfast, one at evening meal Januvia 100 mg one at breakfast Trelegy 100 mcg one puff daily Hydrocodone-APAP 5-325 mg four times daily  prn  Patient needs refills for: Losartan Potassium 50 mg one at breakfast Omeprazole 20 mg one at breakfast and one at evening meal Glipizide ER 5 mg one tablet at breakfast Hydrocodone-APAP 5-325 mg four times daily prn -Rx refill request sent to [facility].  Confirmed delivery date of 02/26/2022, advised patient that pharmacy will contact them the morning of delivery.  Care Gaps: Medicare Annual Wellness: Completed 03/24/2021 Ophthalmology Exam: Overdue since 12/31/2018 Foot Exam: Overdue - never done Hemoglobin A1C: 7.7% on 12/09/2020 Colonoscopy: Aged out, last completed 11/23/2016 Dexa Scan: Completed Mammogram: Aged out  Future Appointments  Date Time Provider [place]  04/02/2022 10:30 AM [facility] HEALTH ADVISOR [facility]   April D Calhoun, [place] Pharmacist Assistant [phone]

## 2022-02-16 NOTE — Telephone Encounter (Signed)
-----   Message from April D Calhoun sent at 02/16/2022  2:26 PM EST ----- Regarding: Rx refill request Patient has an upcoming delivery with [facility]. She will need a 30 DS of the following medications to complete her order:  Losartan Potassium 50 mg one at breakfast Omeprazole 20 mg one at breakfast and one at evening meal GlipizideER 5 mg one tablet at breakfast Hydrocodone-APAP 5-325 mgfour times dailyprn  Please send refills to [facility].  Thank you,  April D Calhoun, [place] Pharmacist Assistant [phone]

## 2022-02-17 MED ORDER — LOSARTAN POTASSIUM 50 MG PO TABS: 50.0000 mg | ORAL_TABLET | Freq: Every day | ORAL | 5 refills | Status: DC

## 2022-02-17 MED ORDER — HYDROCODONE-ACETAMINOPHEN 5-325 MG PO TABS: ORAL_TABLET | ORAL | 0 refills | Status: DC

## 2022-02-17 MED ORDER — GLIPIZIDE ER 5 MG PO TB24: 5.0000 mg | ORAL_TABLET | Freq: Every morning | ORAL | 5 refills | Status: DC

## 2022-02-17 MED ORDER — OMEPRAZOLE 20 MG PO CPDR: DELAYED_RELEASE_CAPSULE | ORAL | 5 refills | Status: DC

## 2022-02-17 NOTE — Telephone Encounter (Signed)
Requested Prescriptions  Pending Prescriptions Disp Refills   losartan (COZAAR) 50 MG tablet 90 tablet 3    Sig: Take 1 tablet (50 mg total) by mouth daily.     Cardiovascular:  Angiotensin Receptor Blockers Failed - 02/16/2022  2:39 PM      Failed - Valid encounter within last 6 months    Recent Outpatient Visits           10 months ago Community acquired pneumonia of left lower lobe of lung   [place]enn[place] Schaumann, Cammie Mcgee, MD   1 year ago Diabetes mellitus type 2 in nonobese [facility])   [place] [name], MD   1 year ago Panlobular emphysema [facility])   [place] [name], MD   1 year ago Shortness of breath   [place] [name], MD   2 years ago Chronic respiratory failure with hypoxia ([place])   [place] Pickard, Cammie Mcgee, MD              Passed - Cr in normal range and within 180 days    Creat  Date Value Ref Range Status  01/22/2022 0.65 0.60 - 0.95 mg/dL Final         Passed - K in normal range and within 180 days    Potassium  Date Value Ref Range Status  01/22/2022 4.6 3.5 - 5.3 mmol/L Final         Passed - Patient is not pregnant      Passed - Last BP in normal range    BP Readings from Last 1 Encounters:  01/22/22 120/72          omeprazole (PRILOSEC) 20 MG capsule 180 capsule 3    Sig: TAKE 1 CAPSULE BY MOUTH TWICE A DAY BEFORE A MEAL     Gastroenterology: Proton Pump Inhibitors Passed - 02/16/2022  2:39 PM      Passed - Valid encounter within last 12 months    Recent Outpatient Visits           10 months ago Community acquired pneumonia of left lower lobe of lung   [place] [name], MD   1 year ago Diabetes mellitus type 2 in nonobese [facility])   [place] Pickard, Cammie Mcgee, MD   1 year ago Panlobular emphysema [facility])   [place] Pickard, Cammie Mcgee, MD   1 year ago  Shortness of breath   [place] [name], MD   2 years ago Chronic respiratory failure with hypoxia ([place])   [place] Pickard, Cammie Mcgee, MD               glipiZIDE (GLUCOTROL XL) 5 MG 24 hr tablet 30 tablet 2    Sig: Take 1 tablet (5 mg total) by mouth every morning.     Endocrinology:  Diabetes - Sulfonylureas Failed - 02/16/2022  2:39 PM      Failed - Valid encounter within last 6 months    Recent Outpatient Visits           10 months ago Community acquired pneumonia of left lower lobe of lung   [place] Pickard, Cammie Mcgee, MD   1 year ago Diabetes mellitus type 2 in nonobese [facility])   [facility] Pickard, Cammie Mcgee, MD   1 year ago Panlobular  emphysema ([place])   [place] Pickard, Cammie Mcgee, MD   1 year ago Shortness of breath   [place] Pickard, Cammie Mcgee, MD   2 years ago Chronic respiratory failure with hypoxia [facility])   [facility] Pickard, Cammie Mcgee, MD              Passed - HBA1C is between 0 and 7.9 and within 180 days    Hgb A1c MFr Bld  Date Value Ref Range Status  01/22/2022 7.4 (H) <5.7 % of total Hgb Final    Comment:    For someone without known diabetes, a hemoglobin A1c value of 6.5% or greater indicates that they may have  diabetes and this should be confirmed with a follow-up  test. . For someone with known diabetes, a value <7% indicates  that their diabetes is well controlled and a value  greater than or equal to 7% indicates suboptimal  control. A1c targets should be individualized based on  duration of diabetes, age, comorbid conditions, and  other considerations. . Currently, no consensus exists regarding use of hemoglobin A1c for diagnosis of diabetes for children. .          Passed - Cr in normal range and within 360 days    Creat  Date Value Ref Range Status  01/22/2022 0.65 0.60 - 0.95 mg/dL Final           HYDROcodone-acetaminophen (NORCO/VICODIN) 5-325 MG tablet 90 tablet 0    Sig: TAKE ONE TABLET BY MOUTH every SIX hours AS NEEDED FOR moderate pain     Not Delegated - Analgesics:  Opioid Agonist Combinations Failed - 02/16/2022  2:39 PM      Failed - This refill cannot be delegated      Failed - Urine Drug Screen completed in last 360 days      Failed - Valid encounter within last 3 months    Recent Outpatient Visits           10 months ago Community acquired pneumonia of left lower lobe of lung   [place] [name], MD   1 year ago Diabetes mellitus type 2 in nonobese [facility])   [place] Pickard, Cammie Mcgee, MD   1 year ago Panlobular emphysema [facility])   [place] [name], MD   1 year ago Shortness of breath   [place] [name], MD   2 years ago Chronic respiratory failure with hypoxia [facility])   [facility] Pickard, Cammie Mcgee, MD

## 2022-02-17 NOTE — Telephone Encounter (Signed)
Requested medication (s) are due for refill today: yes  Requested medication (s) are on the active medication list: yes  Last refill:  01/20/22 #90/0  Future visit scheduled: no  Notes to clinic: Unable to refill per protocol, cannot delegate.    Requested Prescriptions  Pending Prescriptions Disp Refills   HYDROcodone-acetaminophen (NORCO/VICODIN) 5-325 MG tablet 90 tablet 0    Sig: TAKE ONE TABLET BY MOUTH every SIX hours AS NEEDED FOR moderate pain     Not Delegated - Analgesics:  Opioid Agonist Combinations Failed - 02/16/2022  2:39 PM      Failed - This refill cannot be delegated      Failed - Urine Drug Screen completed in last 360 days      Failed - Valid encounter within last 3 months    Recent Outpatient Visits           10 months ago Community acquired pneumonia of left lower lobe of lung   [place] [name], MD   1 year ago Diabetes mellitus type 2 in nonobese [facility])   [place] Pickard, Cammie Mcgee, MD   1 year ago Panlobular emphysema [facility])   [place] [name], MD   1 year ago Shortness of breath   [place] [name], MD   2 years ago Chronic respiratory failure with hypoxia [facility])   [place] Pickard, Cammie Mcgee, MD              Signed Prescriptions Disp Refills   losartan (COZAAR) 50 MG tablet 30 tablet 5    Sig: Take 1 tablet (50 mg total) by mouth daily.     Cardiovascular:  Angiotensin Receptor Blockers Failed - 02/16/2022  2:39 PM      Failed - Valid encounter within last 6 months    Recent Outpatient Visits           10 months ago Community acquired pneumonia of left lower lobe of lung   [place] Pickard, Cammie Mcgee, MD   1 year ago Diabetes mellitus type 2 in nonobese [facility])   [place] [name], MD   1 year ago Panlobular emphysema [facility])   [place] [name], MD   1 year ago Shortness of breath   [place] [name], MD   2 years ago Chronic respiratory failure with hypoxia [facility])   [place] Pickard, Cammie Mcgee, MD              Passed - Cr in normal range and within 180 days    Creat  Date Value Ref Range Status  01/22/2022 0.65 0.60 - 0.95 mg/dL Final         Passed - K in normal range and within 180 days    Potassium  Date Value Ref Range Status  01/22/2022 4.6 3.5 - 5.3 mmol/L Final         Passed - Patient is not pregnant      Passed - Last BP in normal range    BP Readings from Last 1 Encounters:  01/22/22 120/72          omeprazole (PRILOSEC) 20 MG capsule 60 capsule 5    Sig: TAKE 1 CAPSULE BY MOUTH TWICE A DAY BEFORE A MEAL     Gastroenterology: Proton Pump Inhibitors Passed - 02/16/2022  2:39  PM      Passed - Valid encounter within last 12 months    Recent Outpatient Visits           10 months ago Community acquired pneumonia of left lower lobe of lung   [place] Pickard, Cammie Mcgee, MD   1 year ago Diabetes mellitus type 2 in nonobese [facility])   [place] Pickard, Cammie Mcgee, MD   1 year ago Panlobular emphysema [facility])   [place] [name], MD   1 year ago Shortness of breath   [place] [name], MD   2 years ago Chronic respiratory failure with hypoxia [facility])   [place] Pickard, Cammie Mcgee, MD               glipiZIDE (GLUCOTROL XL) 5 MG 24 hr tablet 30 tablet 5    Sig: Take 1 tablet (5 mg total) by mouth every morning.     Endocrinology:  Diabetes - Sulfonylureas Failed - 02/16/2022  2:39 PM      Failed - Valid encounter within last 6 months    Recent Outpatient Visits           10 months ago Community acquired pneumonia of left lower lobe of lung   [place] Pickard, Cammie Mcgee, MD   1 year ago Diabetes mellitus type 2 in  nonobese [facility])   [place] [name], MD   1 year ago Panlobular emphysema ([place])   [place] [name], MD   1 year ago Shortness of breath   [place] [name], MD   2 years ago Chronic respiratory failure with hypoxia ([place])   [facility] Pickard, Cammie Mcgee, MD              Passed - HBA1C is between 0 and 7.9 and within 180 days    Hgb A1c MFr Bld  Date Value Ref Range Status  01/22/2022 7.4 (H) <5.7 % of total Hgb Final    Comment:    For someone without known diabetes, a hemoglobin A1c value of 6.5% or greater indicates that they may have  diabetes and this should be confirmed with a follow-up  test. . For someone with known diabetes, a value <7% indicates  that their diabetes is well controlled and a value  greater than or equal to 7% indicates suboptimal  control. A1c targets should be individualized based on  duration of diabetes, age, comorbid conditions, and  other considerations. . Currently, no consensus exists regarding use of hemoglobin A1c for diagnosis of diabetes for children. .          Passed - Cr in normal range and within 360 days    Creat  Date Value Ref Range Status  01/22/2022 0.65 0.60 - 0.95 mg/dL Final

## 2022-02-23 ENCOUNTER — Other Ambulatory Visit: Payer: Self-pay | Admitting: Family Medicine

## 2022-02-23 ENCOUNTER — Other Ambulatory Visit: Payer: Self-pay

## 2022-02-23 DIAGNOSIS — J441 Chronic obstructive pulmonary disease with (acute) exacerbation: Secondary | ICD-10-CM

## 2022-02-23 DIAGNOSIS — J431 Panlobular emphysema: Secondary | ICD-10-CM

## 2022-02-23 MED ORDER — ALBUTEROL SULFATE (2.5 MG/3ML) 0.083% IN NEBU: 2.5000 mg | INHALATION_SOLUTION | Freq: Four times a day (QID) | RESPIRATORY_TRACT | 1 refills | Status: DC | PRN

## 2022-02-28 ENCOUNTER — Other Ambulatory Visit: Payer: Self-pay | Admitting: Family Medicine

## 2022-02-28 DIAGNOSIS — J441 Chronic obstructive pulmonary disease with (acute) exacerbation: Secondary | ICD-10-CM

## 2022-02-28 DIAGNOSIS — J431 Panlobular emphysema: Secondary | ICD-10-CM

## 2022-03-02 NOTE — Telephone Encounter (Signed)
Called pharmacy to confirm receipt from pharmacy 02/23/22. Pharmacy tech reports patient picked up medication 02/23/22

## 2022-03-02 NOTE — Telephone Encounter (Signed)
Requested by interface surescripts. Already responded to by other means. Requested Prescriptions  Pending Prescriptions Disp Refills   albuterol (PROVENTIL) (2.5 MG/3ML) 0.083% nebulizer solution [Pharmacy Med Name: albuterol sulfate 2.5 mg/3 mL (0.083 %) solution for nebulization] 150 mL 1    Sig: Inhale THE contents of one vial (75ms) by nebulization every SIX hours AS NEEDED FOR WHEEZING AND/OR SHORTNESS OF BREATH     Pulmonology:  Beta Agonists 2 Passed - 02/28/2022  8:02 AM      Passed - Last BP in normal range    BP Readings from Last 1 Encounters:  01/22/22 120/72         Passed - Last Heart Rate in normal range    Pulse Readings from Last 1 Encounters:  01/22/22 81         Passed - Valid encounter within last 12 months    Recent Outpatient Visits           10 months ago Community acquired pneumonia of left lower lobe of lung   [place] Mcgee MD   1 year ago Diabetes mellitus type 2 in nonobese ([facility]   [place]ge[place]   1 year ago [facility]lobular emphysema ([facility]   [place]   1 year ago Shortness of breath   [place]   2 years ago Chronic respiratory failure with hypoxia ([facility]   [facility] Pickard, WCammie Mcgee MD

## 2022-03-18 ENCOUNTER — Telehealth: Payer: Self-pay | Admitting: Pharmacist

## 2022-03-18 NOTE — Progress Notes (Signed)
Chronic Care Management Pharmacy Assistant   Name: Briana Barr  MRN: [id number] DOB: 02-26-1941   Reason for Encounter: Medication Coordination Call    Recent office visits:  None  Recent consult visits:  None  Hospital visits:  None in previous 6 months  Medications: Outpatient Encounter Medications as of 03/18/2022  Medication Sig   acetaZOLAMIDE (DIAMOX) 250 MG tablet TAKE ONE TABLET BY MOUTH EVERY MORNING   albuterol (PROAIR HFA) 108 (90 Base) MCG/ACT inhaler Inhale 2 puffs into the lungs every 6 (six) hours as needed for wheezing or shortness of breath.   albuterol (PROVENTIL) (2.5 MG/3ML) 0.083% nebulizer solution INHALE THE CONTENTS OF 1 VIAL VIA NEBULIZATION EVERY 6 HOURS AS NEEDED FOR [facility] AND SHORTNESS OF BREATH   albuterol (PROVENTIL) (2.5 MG/3ML) 0.083% nebulizer solution Inhale THE contents of one vial (27ms) by nebulization every SIX hours AS NEEDED FOR WHEEZING AND/OR SHORTNESS OF BREATH   aspirin 81 MG tablet Take 81 mg by mouth daily.   cephALEXin (KEFLEX) 500 MG capsule Take 1 capsule (500 mg total) by mouth 3 (three) times daily.   Cholecalciferol (VITAMIN D3) 250 MCG (10000 UT) capsule Take 1 capsule (10,000 Units total) by mouth daily.   ELDERBERRY PO Take 1 each by mouth daily.   escitalopram (LEXAPRO) 10 MG tablet Take 1 tablet (10 mg total) by mouth daily.   Fluticasone-Umeclidin-Vilant (TRELEGY ELLIPTA) 100-62.5-25 MCG/ACT AEPB Inhale 1 Dose into the lungs daily.   glipiZIDE (GLUCOTROL XL) 5 MG 24 hr tablet Take 1 tablet (5 mg total) by mouth every morning.   HYDROcodone-acetaminophen (NORCO/VICODIN) 5-325 MG tablet TAKE ONE TABLET BY MOUTH every SIX hours AS NEEDED FOR moderate pain   losartan (COZAAR) 50 MG tablet Take 1 tablet (50 mg total) by mouth daily.   metFORMIN (GLUCOPHAGE) 1000 MG tablet TAKE ONE TABLET BY MOUTH TWICE DAILY WITH FOOD   metFORMIN (GLUCOPHAGE) 1000 MG tablet Take 1 tablet (1,000 mg total) by mouth 2 (two) times daily with a  meal.   Multiple Vitamins-Minerals (CENTRUM SILVER 50+WOMEN) TABS Take 1 tablet by mouth daily.   omeprazole (PRILOSEC) 20 MG capsule TAKE 1 CAPSULE BY MOUTH TWICE A DAY BEFORE A MEAL   rosuvastatin (CRESTOR) 20 MG tablet TAKE ONE TABLET BY MOUTH EVERY EVENING   sitaGLIPtin (JANUVIA) 100 MG tablet Take 1 tablet (100 mg total) by mouth daily.   No facility-administered encounter medications on file as of 03/18/2022.   Reviewed chart for medication changes ahead of medication coordination call.  No OVs, Consults, or hospital visits since last care coordination call/Pharmacist visit.  No medication changes indicated.  BP Readings from Last 3 Encounters:  01/22/22 120/72  05/13/21 126/65  04/18/21 120/80    Lab Results  Component Value Date   HGBA1C 7.4 (H) 01/22/2022     Patient obtains medications through Adherence Packaging  30 Days   Last adherence delivery included: Acetazolamide 250 mg one tablet at breakfast Glipizide ER 5 mg one tablet at breakfast Metformin 100 mg one at breakfast and one at evening meal Rosuvastatin 20 mg one at evening meal Escitalopram 10 mg one at breakfast Losartan Potassium 50 mg one at breakfast Omeprazole 20 mg one at breakfast, one at evening meal Januvia 100 mg one at breakfast Trelegy 100 mcg one puff daily Hydrocodone-APAP 5-325 mg four times daily prn  Patient is due for next adherence delivery on: 03/30/2022. Called patient and reviewed medications and coordinated delivery.  This delivery to include: Acetazolamide 250 mg one tablet  at breakfast Glipizide ER 5 mg one tablet at breakfast Metformin 100 mg one at breakfast and one at evening meal Rosuvastatin 20 mg one at evening meal Escitalopram 10 mg one at breakfast Losartan Potassium 50 mg one at breakfast Omeprazole 20 mg one at breakfast, one at evening meal Januvia 100 mg one at breakfast Trelegy 100 mcg one puff daily Hydrocodone-APAP 5-325 mg four times daily prn  Patient  needs refills for: Trelegy 100 mcg one puff daily Januvia 100 mg one at breakfast Acetazolamide 250 mg one tablet at breakfast Rosuvastatin 20 mg one at evening meal Hydrocodone-APAP 5-325 mg four times daily prn  Confirmed delivery date of 03/30/2022, advised patient that pharmacy will contact them the morning of delivery.  Care Gaps: Medicare Annual Wellness: Completed 03/24/2021 Ophthalmology Exam: Overdue since 12/31/2018 Foot Exam: Overdue - never done Hemoglobin A1C: 7.4% on 01/22/2022 Colonoscopy: Aged out, last completed 11/23/2016 Dexa Scan: Completed  Future Appointments  Date Time Provider [place]  04/01/2022  3:30 PM [facility] HEALTH ADVISOR [id number] [place], [place] Pharmacist Assistant [phone]

## 2022-03-19 ENCOUNTER — Other Ambulatory Visit: Payer: Self-pay

## 2022-03-19 MED ORDER — ROSUVASTATIN CALCIUM 20 MG PO TABS: 20.0000 mg | ORAL_TABLET | Freq: Every evening | ORAL | 3 refills | Status: AC

## 2022-03-19 MED ORDER — SITAGLIPTIN PHOSPHATE 100 MG PO TABS: 100.0000 mg | ORAL_TABLET | Freq: Every day | ORAL | 3 refills | Status: AC

## 2022-03-19 MED ORDER — HYDROCODONE-ACETAMINOPHEN 5-325 MG PO TABS: ORAL_TABLET | ORAL | 0 refills | Status: DC

## 2022-03-19 MED ORDER — ACETAZOLAMIDE 250 MG PO TABS: 250.0000 mg | ORAL_TABLET | Freq: Every morning | ORAL | 3 refills | Status: AC

## 2022-03-19 MED ORDER — TRELEGY ELLIPTA 100-62.5-25 MCG/ACT IN AEPB: 1.0000 | INHALATION_SPRAY | Freq: Every day | RESPIRATORY_TRACT | 11 refills | Status: AC

## 2022-04-01 ENCOUNTER — Ambulatory Visit (INDEPENDENT_AMBULATORY_CARE_PROVIDER_SITE_OTHER): Payer: Medicare Other

## 2022-04-01 VITALS — Ht 66.0 in | Wt 160.0 lb

## 2022-04-01 DIAGNOSIS — Z0000 Encounter for general adult medical examination without abnormal findings: Secondary | ICD-10-CM

## 2022-04-01 NOTE — Progress Notes (Signed)
Subjective:   Briana Barr is a 81 y.o. female who presents for Medicare Annual (Subsequent) preventive examination.Virtual Visit via Telephone Note  I connected with  Briana Barr on 04/01/22 at  3:30 PM EST by telephone and verified that I am speaking with the correct person using two identifiers.  Location: Patient: HOME  Provider: [facility] Persons participating in the virtual visit: patient/Nurse Health Advisor   I discussed the limitations, risks, security and privacy concerns of performing an evaluation and management service by telephone and the availability of in person appointments. The patient expressed understanding and agreed to proceed.  Interactive audio and video telecommunications were attempted between this nurse and patient, however failed, due to patient having technical difficulties OR patient did not have access to video capability.  We continued and completed visit with audio only.  Some vital signs may be absent or patient reported.   Chriss Driver, LPN  Review of Systems     Cardiac Risk Factors include: advanced age (>18mn, >>71women);diabetes mellitus;hypertension;dyslipidemia;sedentary lifestyle     Objective:    Today's Vitals   04/01/22 1535  Weight: 160 lb (72.6 kg)  Height: '5\' 6"'$  (1.676 m)   Body mass index is 25.82 kg/m.     04/01/2022    3:43 PM 03/27/2021   10:43 AM 11/20/2020    3:44 PM 03/13/2020   11:44 PM 11/13/2019   12:45 PM 06/17/2018    7:05 PM 11/23/2016    7:00 AM  Advanced Directives  Does Patient Have a Medical Advance Directive? Yes Yes Yes No Yes Yes Yes  Type of AParamed[place]of [place] will [place] will   Out of facility DNR (pink MOST or yellow form) [place] Living will  Does patient want to make changes to medical advance directive? No - Patient declined    No - Patient declined    Copy of [place] Chart? Yes  - validated most recent copy scanned in chart (See row information) Yes - validated most recent copy scanned in chart (See row information)     No - copy requested  Would patient like information on creating a medical advance directive?   No - Patient declined No - Patient declined     Pre-existing out of facility DNR order (yellow form or pink MOST form)     Yellow form placed in chart (order not valid for inpatient use)      Current Medications (verified) Outpatient Encounter Medications as of 04/01/2022  Medication Sig   acetaZOLAMIDE (DIAMOX) 250 MG tablet Take 1 tablet (250 mg total) by mouth every morning.   albuterol (PROAIR HFA) 108 (90 Base) MCG/ACT inhaler Inhale 2 puffs into the lungs every 6 (six) hours as needed for wheezing or shortness of breath.   albuterol (PROVENTIL) (2.5 MG/3ML) 0.083% nebulizer solution INHALE THE CONTENTS OF 1 VIAL VIA NEBULIZATION EVERY 6 HOURS AS NEEDED FOR [facility] InstituteAND SHORTNESS OF BREATH   albuterol (PROVENTIL) (2.5 MG/3ML) 0.083% nebulizer solution Inhale THE contents of one vial (347m) by nebulization every SIX hours AS NEEDED FOR WHEEZING AND/OR SHORTNESS OF BREATH   aspirin 81 MG tablet Take 81 mg by mouth daily.   cephALEXin (KEFLEX) 500 MG capsule Take 1 capsule (500 mg total) by mouth 3 (three) times daily.   Cholecalciferol (VITAMIN D3) 250 MCG (10000 UT) capsule Take 1 capsule (10,000 Units total) by mouth daily.   ELDERBERRY PO Take 1 each  by mouth daily.   escitalopram (LEXAPRO) 10 MG tablet Take 1 tablet (10 mg total) by mouth daily.   Fluticasone-Umeclidin-Vilant (TRELEGY ELLIPTA) 100-62.5-25 MCG/ACT AEPB Inhale 1 Dose into the lungs daily.   glipiZIDE (GLUCOTROL XL) 5 MG 24 hr tablet Take 1 tablet (5 mg total) by mouth every morning.   HYDROcodone-acetaminophen (NORCO/VICODIN) 5-325 MG tablet TAKE ONE TABLET BY MOUTH every SIX hours AS NEEDED FOR moderate pain   losartan (COZAAR) 50 MG tablet Take 1 tablet (50 mg total) by mouth daily.    metFORMIN (GLUCOPHAGE) 1000 MG tablet TAKE ONE TABLET BY MOUTH TWICE DAILY WITH FOOD   metFORMIN (GLUCOPHAGE) 1000 MG tablet Take 1 tablet (1,000 mg total) by mouth 2 (two) times daily with a meal.   Multiple Vitamins-Minerals (CENTRUM SILVER 50+WOMEN) TABS Take 1 tablet by mouth daily.   omeprazole (PRILOSEC) 20 MG capsule TAKE 1 CAPSULE BY MOUTH TWICE A DAY BEFORE A MEAL   rosuvastatin (CRESTOR) 20 MG tablet Take 1 tablet (20 mg total) by mouth every evening.   sitaGLIPtin (JANUVIA) 100 MG tablet Take 1 tablet (100 mg total) by mouth daily.   No facility-administered encounter medications on file as of 04/01/2022.    Allergies (verified) Vioxx [rofecoxib]   History: Past Medical History:  Diagnosis Date   Barrett esophagus    Colon polyps    benign   COPD (chronic obstructive pulmonary disease) (HCC)    Duodenal ulcer    DVT (deep venous thrombosis) (HCC)    left leg x 2   Gallstones    GERD (gastroesophageal reflux disease)    Glaucoma    Hyperlipidemia    Ischemic colitis ([place])    Osteoporosis    Pneumonia    hx of x 2 as a child    Renal cyst, right    Retinopathy, background, proliferative    Past Surgical History:  Procedure Laterality Date   cervical vertebral fusion     c5-6, 6-7   CHOLECYSTECTOMY     COLONOSCOPY WITH PROPOFOL N/A 11/23/2016   Procedure: COLONOSCOPY WITH PROPOFOL;  Surgeon: Ladene Artist, MD;  Location: [facility];  Service: Endoscopy;  Laterality: N/A;   ESOPHAGOGASTRODUODENOSCOPY (EGD) WITH PROPOFOL N/A 11/23/2016   Procedure: ESOPHAGOGASTRODUODENOSCOPY (EGD) WITH PROPOFOL;  Surgeon: Ladene Artist, MD;  Location: [facility];  Service: Endoscopy;  Laterality: N/A;   FEMORAL-POPLITEAL BYPASS GRAFT     IR RADIOLOGIST EVAL & MGMT  06/06/2019   IR RADIOLOGIST EVAL & MGMT  07/02/2020   IR RADIOLOGIST EVAL & MGMT  07/08/2021   Family History  Problem Relation Age of Onset   Other Father        some sort of blood cancer-had to get  transfusions   Stomach cancer Maternal Grandmother    Stomach cancer Maternal Grandfather    Social History   Socioeconomic History   Marital status: Widowed    Spouse name: Not on file   Number of children: 3   Years of education: Not on file   Highest education level: Not on file  Occupational History   Occupation: retired  Tobacco Use   Smoking status: Every Day    Packs/day: 1.00    Types: Cigarettes   Smokeless tobacco: Never   Tobacco comments:    started age 70  Vaping Use   Vaping Use: Never used  Substance and Sexual Activity   Alcohol use: No   Drug use: No   Sexual activity: Not Currently  Other Topics Concern  Not on file  Social History Narrative   Not on file   Social Determinants of Health   Financial Resource Strain: Low Risk  (04/01/2022)   Overall Financial Resource Strain (CARDIA)    Difficulty of Paying Living Expenses: Not hard at all  Food Insecurity: No Food Insecurity (04/01/2022)   Hunger Vital Sign    Worried About Running Out of Food in the Last Year: Never true    Ran Out of Food in the Last Year: Never true  Transportation Needs: No Transportation Needs (04/01/2022)   PRAPARE - Hydrologist (Medical): No    Lack of Transportation (Non-Medical): No  Physical Activity: Insufficiently Active (04/01/2022)   Exercise Vital Sign    Days of Exercise per Week: 5 days    Minutes of Exercise per Session: 20 min  Stress: No Stress Concern Present (04/01/2022)   [place]    Feeling of Stress : Not at all  Social Connections: Moderately Isolated (04/01/2022)   Social Connection and Isolation Panel [NHANES]    Frequency of Communication with Friends and Family: More than three times a week    Frequency of Social Gatherings with Friends and Family: More than three times a week    Attends Religious Services: 1 to 4 times per year    Active Member of  [employer] or Organizations: No    Attends Archivist Meetings: Never    Marital Status: Widowed    Tobacco Counseling Ready to quit: Not Answered Counseling given: Not Answered Tobacco comments: started age 89   Clinical Intake:  Pre-visit preparation completed: Yes  Pain : No/denies pain     BMI - recorded: 25.82 Nutritional Status: BMI 25 -29 Overweight Nutritional Risks: None Diabetes: Yes  How often do you need to have someone help you when you read instructions, pamphlets, or other written materials from your doctor or pharmacy?: 1 - Never  Diabetic?YES Nutrition Risk Assessment:  Has the patient had any N/V/D within the last 2 months?  No  Does the patient have any non-healing wounds?  No  Has the patient had any unintentional weight loss or weight gain?  No   Diabetes:  Is the patient diabetic?  Yes  If diabetic, was a CBG obtained today?  No  Did the patient bring in their glucometer from home?  No  How often do you monitor your CBG's? DAILY.   Financial Strains and Diabetes Management:  Are you having any financial strains with the device, your supplies or your medication? No .  Does the patient want to be seen by Chronic Care Management for management of their diabetes?  No  Would the patient like to be referred to a Nutritionist or for Diabetic Management?  No   Diabetic Exams:  Diabetic Eye Exam: Completed PT DECLINES. Overdue for diabetic eye exam. Pt has been advised about the importance in completing this exam.   Diabetic Foot Exam: Completed DUE. Pt has been advised about the importance in completing this exam.   Interpreter Needed?: No  Information entered by :: MJ Juletta Berhe, LPN   Activities of Daily Living    04/01/2022    3:43 PM  In your present state of health, do you have any difficulty performing the following activities:  Hearing? 0  Vision? 0  Difficulty concentrating or making decisions? 1  Walking or climbing stairs? 1   Dressing or bathing? 1  Doing errands, shopping?  1  Preparing Food and eating ? Y  Using the Toilet? N  In the past six months, have you accidently leaked urine? Y  Do you have problems with loss of bowel control? Y  Managing your Medications? Y  Managing your Finances? Y  Housekeeping or managing your Housekeeping? Y    Patient Care Team: [name], MD as PCP - General (Family Medicine) Edythe Clarity, [facility] as Pharmacist (Pharmacist)  Indicate any recent Medical Services you may have received from other than [facility] providers in the past year (date may be approximate).     Assessment:   This is a routine wellness examination for Freya.  Hearing/Vision screen Hearing Screening - Comments:: Hearing aids.  Vision Screening - Comments:: Glasses. Pt declines eye exam.   Dietary issues and exercise activities discussed: Current Exercise Habits: Home exercise routine, Type of exercise: stretching;Other - see comments (Chair exercises.), Time (Minutes): 15, Frequency (Times/Week): 5, Weekly Exercise (Minutes/Week): 75, Intensity: Mild, Exercise limited by: cardiac condition(s);orthopedic condition(s);respiratory conditions(s)   Goals Addressed             This Visit's Progress    Exercise 3x per week (30 min per time)   On track    Pt would like to continue to stay happy and healthy.       Depression Screen    04/01/2022    3:41 PM 01/22/2022    2:04 PM 01/22/2022    2:00 PM 03/27/2021   10:41 AM 11/20/2020    3:45 PM 01/12/2019    9:51 AM 12/07/2017    9:55 AM  PHQ 2/9 Scores  PHQ - 2 Score 0 2 2 0 0 4 0  PHQ- 9 Score  '2 4   14     '$ Fall Risk    04/01/2022    3:43 PM 03/27/2021   10:44 AM 11/20/2020    3:44 PM 01/12/2019    9:51 AM 12/07/2017    9:55 AM  Fall Risk   Falls in the past year? 0 0 0 0 No  Number falls in past yr: 0 0     Injury with Fall? 0 0     Risk for fall due to : No Fall Risks Impaired balance/gait;Impaired mobility;History of fall(s)      Follow up Falls prevention discussed Falls prevention discussed  Falls evaluation completed     FALL RISK PREVENTION PERTAINING TO THE HOME:  Any stairs in or around the home? Yes  If so, are there any without handrails? No  Home free of loose throw rugs in walkways, pet beds, electrical cords, etc? Yes  Adequate lighting in your home to reduce risk of falls? Yes   ASSISTIVE DEVICES UTILIZED TO PREVENT FALLS:  Life alert? No  Use of a cane, walker or w/c? Yes  Grab bars in the bathroom? Yes  Shower chair or bench in shower? Yes  Elevated toilet seat or a handicapped toilet? Yes   TIMED UP AND GO:  Was the test performed? No .    Cognitive Function:        03/27/2021   10:48 AM  6CIT Screen  What Year? 0 points  What month? 0 points  What time? 0 points  Count back from 20 0 points  Months in reverse 4 points  Repeat phrase 10 points  Total Score 14 points    Immunizations Immunization History  Administered Date(s) Administered   Fluad Quad(high Dose 65+) 01/12/2019   Influenza,inj,Quad PF,6+ Mos 03/02/2016,  12/07/2017   PFIZER(Purple Top)SARS-COV-2 Vaccination 03/28/2020   Pneumococcal Conjugate-13 04/13/2014   Pneumococcal Polysaccharide-23 12/07/2017    TDAP status: Due, Education has been provided regarding the importance of this vaccine. Advised may receive this vaccine at local pharmacy or Health Dept. Aware to provide a copy of the vaccination record if obtained from local pharmacy or Health Dept. Verbalized acceptance and understanding.  Flu Vaccine status: Declined, Education has been provided regarding the importance of this vaccine but patient still declined. Advised may receive this vaccine at local pharmacy or Health Dept. Aware to provide a copy of the vaccination record if obtained from local pharmacy or Health Dept. Verbalized acceptance and understanding.  Pneumococcal vaccine status: Up to date  Covid-19 vaccine status: Completed  vaccines  Qualifies for Shingles Vaccine? Yes   Zostavax completed No   Shingrix Completed?: No.    Education has been provided regarding the importance of this vaccine. Patient has been advised to call insurance company to determine out of pocket expense if they have not yet received this vaccine. Advised may also receive vaccine at local pharmacy or Health Dept. Verbalized acceptance and understanding.  Screening Tests Health Maintenance  Topic Date Due   COVID-19 Vaccine (2 - 2023-24 season) 04/17/2022 (Originally 12/12/2021)   FOOT EXAM  05/14/2022 (Originally 10/29/1950)   OPHTHALMOLOGY EXAM  05/14/2022 (Originally 12/31/2018)   Zoster Vaccines- Shingrix (1 of 2) 07/01/2022 (Originally 10/29/1990)   INFLUENZA VACCINE  07/12/2022 (Originally 11/11/2021)   HEMOGLOBIN A1C  07/24/2022   Pneumonia Vaccine 22+ Years old  Completed   DEXA SCAN  Completed   HPV VACCINES  Aged Out   DTaP/Tdap/Td  Discontinued    Health Maintenance  There are no preventive care reminders to display for this patient.   Colorectal cancer screening: No longer required.   Mammogram status: No longer required due to AGE.  Bone Density status: Completed 02/08/2018. Results reflect: Bone density results: OSTEOPOROSIS. Repeat every 2 years.  Lung Cancer Screening: (Low Dose CT Chest recommended if Age 37-80 years, 30 pack-year currently smoking OR have quit w/in 15years.) does qualify.   Lung Cancer Screening Referral: DECLINED  Additional Screening:  Hepatitis C Screening: does qualify; Completed 11/13/2019  Vision Screening: Recommended annual ophthalmology exams for early detection of glaucoma and other disorders of the eye. Is the patient up to date with their annual eye exam?  No  Who is the provider or what is the name of the office in which the patient attends annual eye exams? PT DECLINED If pt is not established with a provider, would they like to be referred to a provider to establish care? No .    Dental Screening: Recommended annual dental exams for proper oral hygiene  Community Resource Referral / Chronic Care Management: CRR required this visit?  No   CCM required this visit?  No      Plan:     I have personally reviewed and noted the following in the patient's chart:   Medical and social history Use of alcohol, tobacco or illicit drugs  Current medications and supplements including opioid prescriptions. Patient is not currently taking opioid prescriptions. Functional ability and status Nutritional status Physical activity Advanced directives List of other physicians Hospitalizations, surgeries, and ER visits in previous 12 months Vitals Screenings to include cognitive, depression, and falls Referrals and appointments  In addition, I have reviewed and discussed with patient certain preventive protocols, quality metrics, and best practice recommendations. A written personalized care plan for preventive services as  well as general preventive health recommendations were provided to patient.     Chriss Driver, LPN   02/77/4128   Nurse Notes: Discussed vaccines and how to obtain with pt's daughter in law, [place].

## 2022-04-01 NOTE — Patient Instructions (Signed)
Ms. Briana Barr , Thank you for taking time to come for your Medicare Wellness Visit. I appreciate your ongoing commitment to your health goals. Please review the following plan we discussed and let me know if I can assist you in the future.   These are the goals we discussed:  Goals      Exercise 3x per week (30 min per time)     Pt would like to continue to stay happy and healthy.     Pharmacy Care Plan:     CARE PLAN ENTRY (see longitudinal plan of care for additional care plan information)  Current Barriers:  Chronic Disease Management support, education, and care coordination needs related to Hypertension, Diabetes, and COPD   Hypertension BP Readings from Last 3 Encounters:  03/25/20 130/70  03/16/20 (!) 145/93  03/11/20 120/70  Pharmacist Clinical Goal(s): Over the next 120 days, patient will work with PharmD and providers to maintain BP goal <140/90 Current regimen:  Losartan '50mg'$  Interventions: Reviewed home blood pressure monitoring Discussed medication adherence Patient self care activities - Over the next 90 days, patient will: Check BP daily, document, and provide at future appointments Ensure daily salt intake < 2300 mg/day  Diabetes Lab Results  Component Value Date/Time   HGBA1C 8.7 (H) 03/14/2020 10:56 AM   HGBA1C 7.0 (H) 12/01/2019 12:41 PM  Pharmacist Clinical Goal(s): Over the next 120 days, patient will work with PharmD and providers to maintain A1c goal <8% Current regimen:  Metformin '500mg'$  bid Januvia '100mg'$  daily Interventions: Discussed risk of hypoglycemia with advancing age and comorbidities Reviewed home blood sugar readings Discussed medication adherence Updated medication list Patient self care activities - Over the next 120 days, patient will: Check blood sugar once daily, document, and provide at future appointments Contact provider with any episodes of hypoglycemia  COPD Pharmacist Clinical Goal(s) Over the next 90 days, patient will  work with PharmD and providers to optimize medication and minimize symptoms of COPD Current regimen:  Trelegy Ellipta 100-62.5-25 mcg daily Albuterol HFA 90 mcg prn Duoneb 0.5-2.5 mg/ml prn Interventions: Reviewed frequency of inhaler use Discussed episodes of SOB Counseled on proper inhaler use Patient self care activities - Over the next 90 days, patient will: Continue to take medications as directed Contact providers with any new or worsening SOB  Medication management Pharmacist Clinical Goal(s): Over the next 90 days, patient will work with PharmD and providers to maintain optimal medication adherence Current pharmacy: [facility] Interventions Comprehensive medication review performed. Utilize [facility] pharmacy for medication synchronization, packaging and delivery Patient self care activities - Over the next 90 days, patient will: Focus on medication adherence by switching to pill packs from [facility] Take medications as prescribed Report any questions or concerns to PharmD and/or provider(s)  Please see past updates related to this goal by clicking on the "Past Updates" button in the selected goal          This is a list of the screening recommended for you and due dates:  Health Maintenance  Topic Date Due   COVID-19 Vaccine (2 - 2023-24 season) 04/17/2022*   Complete foot exam   05/14/2022*   Eye exam for diabetics  05/14/2022*   Zoster (Shingles) Vaccine (1 of 2) 07/01/2022*   Flu Shot  07/12/2022*   Hemoglobin A1C  07/24/2022   Pneumonia Vaccine  Completed   DEXA scan (bone density measurement)  Completed   HPV Vaccine  Aged Out   DTaP/Tdap/Td vaccine  Discontinued  *Topic was postponed. The date shown  is not the original due date.    Advanced directives: Advance directive discussed with you today. I have provided a copy for you to complete at home and have notarized. Once this is complete please bring a copy in to our office so we can scan it into your chart.    Conditions/risks identified: Aim for 30 minutes of exercise or brisk walking, 6-8 glasses of water, and 5 servings of fruits and vegetables each day.   Next appointment: Follow up in one year for your annual wellness visit 03/2023.   Preventive Care 80 Years and Older, Female Preventive care refers to lifestyle choices and visits with your health care provider that can promote health and wellness. What does preventive care include? A yearly physical exam. This is also called an annual well check. Dental exams once or twice a year. Routine eye exams. Ask your health care provider how often you should have your eyes checked. Personal lifestyle choices, including: Daily care of your teeth and gums. Regular physical activity. Eating a healthy diet. Avoiding tobacco and drug use. Limiting alcohol use. Practicing safe sex. Taking low-dose aspirin every day. Taking vitamin and mineral supplements as recommended by your health care provider. What happens during an annual well check? The services and screenings done by your health care provider during your annual well check will depend on your age, overall health, lifestyle risk factors, and family history of disease. Counseling  Your health care provider may ask you questions about your: Alcohol use. Tobacco use. Drug use. Emotional well-being. Home and relationship well-being. Sexual activity. Eating habits. History of falls. Memory and ability to understand (cognition). Work and work Statistician. Reproductive health. Screening  You may have the following tests or measurements: Height, weight, and BMI. Blood pressure. Lipid and cholesterol levels. These may be checked every 5 years, or more frequently if you are over 66 years old. Skin check. Lung cancer screening. You may have this screening every year starting at age 48 if you have a 30-pack-year history of smoking and currently smoke or have quit within the past 15  years. Fecal occult blood test (FOBT) of the stool. You may have this test every year starting at age 19. Flexible sigmoidoscopy or colonoscopy. You may have a sigmoidoscopy every 5 years or a colonoscopy every 10 years starting at age 89. Hepatitis C blood test. Hepatitis B blood test. Sexually transmitted disease (STD) testing. Diabetes screening. This is done by checking your blood sugar (glucose) after you have not eaten for a while (fasting). You may have this done every 1-3 years. Bone density scan. This is done to screen for osteoporosis. You may have this done starting at age 91. Mammogram. This may be done every 1-2 years. Talk to your health care provider about how often you should have regular mammograms. Talk with your health care provider about your test results, treatment options, and if necessary, the need for more tests. Vaccines  Your health care provider may recommend certain vaccines, such as: Influenza vaccine. This is recommended every year. Tetanus, diphtheria, and acellular pertussis (Tdap, Td) vaccine. You may need a Td booster every 10 years. Zoster vaccine. You may need this after age 10. Pneumococcal 13-valent conjugate (PCV13) vaccine. One dose is recommended after age 33. Pneumococcal polysaccharide (PPSV23) vaccine. One dose is recommended after age 96. Talk to your health care provider about which screenings and vaccines you need and how often you need them. This information is not intended to replace advice given to  you by your health care provider. Make sure you discuss any questions you have with your health care provider. Document Released: 04/26/2015 Document Revised: 12/18/2015 Document Reviewed: 01/29/2015 Elsevier Interactive Patient Education  2017 [place] Prevention in the Home Falls can cause injuries. They can happen to people of all ages. There are many things you can do to make your home safe and to help prevent falls. What can I do on  the outside of my home? Regularly fix the edges of walkways and driveways and fix any cracks. Remove anything that might make you trip as you walk through a door, such as a raised step or threshold. Trim any bushes or trees on the path to your home. Use bright outdoor lighting. Clear any walking paths of anything that might make someone trip, such as rocks or tools. Regularly check to see if handrails are loose or broken. Make sure that both sides of any steps have handrails. Any raised decks and porches should have guardrails on the edges. Have any leaves, snow, or ice cleared regularly. Use sand or salt on walking paths during winter. Clean up any spills in your garage right away. This includes oil or grease spills. What can I do in the bathroom? Use night lights. Install grab bars by the toilet and in the tub and shower. Do not use towel bars as grab bars. Use non-skid mats or decals in the tub or shower. If you need to sit down in the shower, use a plastic, non-slip stool. Keep the floor dry. Clean up any water that spills on the floor as soon as it happens. Remove soap buildup in the tub or shower regularly. Attach bath mats securely with double-sided non-slip rug tape. Do not have throw rugs and other things on the floor that can make you trip. What can I do in the bedroom? Use night lights. Make sure that you have a light by your bed that is easy to reach. Do not use any sheets or blankets that are too big for your bed. They should not hang down onto the floor. Have a firm chair that has side arms. You can use this for support while you get dressed. Do not have throw rugs and other things on the floor that can make you trip. What can I do in the kitchen? Clean up any spills right away. Avoid walking on wet floors. Keep items that you use a lot in easy-to-reach places. If you need to reach something above you, use a strong step stool that has a grab bar. Keep electrical cords out  of the way. Do not use floor polish or wax that makes floors slippery. If you must use wax, use non-skid floor wax. Do not have throw rugs and other things on the floor that can make you trip. What can I do with my stairs? Do not leave any items on the stairs. Make sure that there are handrails on both sides of the stairs and use them. Fix handrails that are broken or loose. Make sure that handrails are as long as the stairways. Check any carpeting to make sure that it is firmly attached to the stairs. Fix any carpet that is loose or worn. Avoid having throw rugs at the top or bottom of the stairs. If you do have throw rugs, attach them to the floor with carpet tape. Make sure that you have a light switch at the top of the stairs and the bottom of the stairs.  If you do not have them, ask someone to add them for you. What else can I do to help prevent falls? Wear shoes that: Do not have high heels. Have rubber bottoms. Are comfortable and fit you well. Are closed at the toe. Do not wear sandals. If you use a stepladder: Make sure that it is fully opened. Do not climb a closed stepladder. Make sure that both sides of the stepladder are locked into place. Ask someone to hold it for you, if possible. Clearly mark and make sure that you can see: Any grab bars or handrails. First and last steps. Where the edge of each step is. Use tools that help you move around (mobility aids) if they are needed. These include: Canes. Walkers. Scooters. Crutches. Turn on the lights when you go into a dark area. Replace any light bulbs as soon as they burn out. Set up your furniture so you have a clear path. Avoid moving your furniture around. If any of your floors are uneven, fix them. If there are any pets around you, be aware of where they are. Review your medicines with your doctor. Some medicines can make you feel dizzy. This can increase your chance of falling. Ask your doctor what other things that  you can do to help prevent falls. This information is not intended to replace advice given to you by your health care provider. Make sure you discuss any questions you have with your health care provider. Document Released: 01/24/2009 Document Revised: 09/05/2015 Document Reviewed: 05/04/2014 Elsevier Interactive Patient Education  2017 [employer].

## 2022-04-17 ENCOUNTER — Telehealth: Payer: Self-pay | Admitting: Pharmacist

## 2022-04-17 NOTE — Progress Notes (Signed)
Care Management & Coordination Services Pharmacy Team  Reason for Encounter: Medication coordination and delivery  Contacted patient on 04/17/2022 to discuss medications   Recent office visits:  None  Recent consult visits:  None  Hospital visits:  None in previous 6 months  Medications: Outpatient Encounter Medications as of 04/17/2022  Medication Sig   acetaZOLAMIDE (DIAMOX) 250 MG tablet Take 1 tablet (250 mg total) by mouth every morning.   albuterol (PROAIR HFA) 108 (90 Base) MCG/ACT inhaler Inhale 2 puffs into the lungs every 6 (six) hours as needed for wheezing or shortness of breath.   albuterol (PROVENTIL) (2.5 MG/3ML) 0.083% nebulizer solution INHALE THE CONTENTS OF 1 VIAL VIA NEBULIZATION EVERY 6 HOURS AS NEEDED FOR [facility] AND SHORTNESS OF BREATH   albuterol (PROVENTIL) (2.5 MG/3ML) 0.083% nebulizer solution Inhale THE contents of one vial (60ms) by nebulization every SIX hours AS NEEDED FOR WHEEZING AND/OR SHORTNESS OF BREATH   aspirin 81 MG tablet Take 81 mg by mouth daily.   cephALEXin (KEFLEX) 500 MG capsule Take 1 capsule (500 mg total) by mouth 3 (three) times daily.   Cholecalciferol (VITAMIN D3) 250 MCG (10000 UT) capsule Take 1 capsule (10,000 Units total) by mouth daily.   ELDERBERRY PO Take 1 each by mouth daily.   escitalopram (LEXAPRO) 10 MG tablet Take 1 tablet (10 mg total) by mouth daily.   Fluticasone-Umeclidin-Vilant (TRELEGY ELLIPTA) 100-62.5-25 MCG/ACT AEPB Inhale 1 Dose into the lungs daily.   glipiZIDE (GLUCOTROL XL) 5 MG 24 hr tablet Take 1 tablet (5 mg total) by mouth every morning.   HYDROcodone-acetaminophen (NORCO/VICODIN) 5-325 MG tablet TAKE ONE TABLET BY MOUTH every SIX hours AS NEEDED FOR moderate pain   losartan (COZAAR) 50 MG tablet Take 1 tablet (50 mg total) by mouth daily.   metFORMIN (GLUCOPHAGE) 1000 MG tablet TAKE ONE TABLET BY MOUTH TWICE DAILY WITH FOOD   metFORMIN (GLUCOPHAGE) 1000 MG tablet Take 1 tablet (1,000 mg total) by mouth 2  (two) times daily with a meal.   Multiple Vitamins-Minerals (CENTRUM SILVER 50+WOMEN) TABS Take 1 tablet by mouth daily.   omeprazole (PRILOSEC) 20 MG capsule TAKE 1 CAPSULE BY MOUTH TWICE A DAY BEFORE A MEAL   rosuvastatin (CRESTOR) 20 MG tablet Take 1 tablet (20 mg total) by mouth every evening.   sitaGLIPtin (JANUVIA) 100 MG tablet Take 1 tablet (100 mg total) by mouth daily.   No facility-administered encounter medications on file as of 04/17/2022.   BP Readings from Last 3 Encounters:  01/22/22 120/72  05/13/21 126/65  04/18/21 120/80    Pulse Readings from Last 3 Encounters:  01/22/22 81  05/13/21 81  04/18/21 68    Lab Results  Component Value Date/Time   HGBA1C 7.4 (H) 01/22/2022 02:23 PM   HGBA1C 7.7 (H) 12/09/2020 11:52 AM   Lab Results  Component Value Date   CREATININE 0.65 01/22/2022   BUN 25 01/22/2022   GFRNONAA >60 03/16/2020   GFRAA 101 12/01/2019   NA 140 01/22/2022   K 4.6 01/22/2022   CALCIUM 10.0 01/22/2022   CO2 26 01/22/2022     Last adherence delivery date: 03/30/2022   Patient is due for next adherence delivery on: 04/28/2022  Spoke with patient on 04/17/2022 reviewed medications and coordinated delivery.  This delivery to include: Adherence Packaging  30 Days  Acetazolamide 250 mg one tablet at breakfast Glipizide ER 5 mg one tablet at breakfast Metformin 100 mg one at breakfast and one at evening meal Rosuvastatin 20 mg one  at evening meal Escitalopram 10 mg one at breakfast Losartan Potassium 50 mg one at breakfast Omeprazole 20 mg one at breakfast, one at evening meal Januvia 100 mg one at breakfast Trelegy 100 mcg one puff daily Hydrocodone-APAP 5-325 mg four times daily prn   Refills requested from providers include: Escitalopram 10 mg one at breakfast Hydrocodone-APAP 5-325 mg four times daily prn  Confirmed delivery date of 04/28/2022, advised patient that pharmacy will contact them the morning of delivery.   Any concerns about  your medications? No  How often do you forget or accidentally miss a dose? Never  Do you use a pillbox? No  Is patient in packaging Yes  If yes  What is the date on your next pill pack? unsure  Any concerns or issues with your packaging? no   Cycle dispensing form sent to Leata Mouse, [facility] for review.   No future appointments.  April D Calhoun, [place] Pharmacist Assistant [phone]

## 2022-04-20 ENCOUNTER — Other Ambulatory Visit: Payer: Self-pay

## 2022-04-20 MED ORDER — HYDROCODONE-ACETAMINOPHEN 5-325 MG PO TABS: ORAL_TABLET | ORAL | 0 refills | Status: DC

## 2022-04-20 MED ORDER — ESCITALOPRAM OXALATE 10 MG PO TABS: 10.0000 mg | ORAL_TABLET | Freq: Every day | ORAL | 0 refills | Status: DC

## 2022-05-15 ENCOUNTER — Encounter (HOSPITAL_COMMUNITY): Payer: Self-pay

## 2022-05-15 ENCOUNTER — Other Ambulatory Visit: Payer: Self-pay

## 2022-05-15 ENCOUNTER — Inpatient Hospital Stay (HOSPITAL_COMMUNITY)
Admission: EM | Admit: 2022-05-15 | Discharge: 2022-05-19 | DRG: 689 | Disposition: A | Discharge status: Skilled Nursing Facility | Payer: Medicare Other | Attending: Internal Medicine | Admitting: Internal Medicine

## 2022-05-15 ENCOUNTER — Emergency Department (HOSPITAL_COMMUNITY): Payer: Medicare Other

## 2022-05-15 DIAGNOSIS — J9611 Chronic respiratory failure with hypoxia: Secondary | ICD-10-CM | POA: Diagnosis present

## 2022-05-15 DIAGNOSIS — B9620 Unspecified Escherichia coli [E. coli] as the cause of diseases classified elsewhere: Secondary | ICD-10-CM | POA: Diagnosis present

## 2022-05-15 DIAGNOSIS — R197 Diarrhea, unspecified: Secondary | ICD-10-CM | POA: Diagnosis not present

## 2022-05-15 DIAGNOSIS — Z1152 Encounter for screening for COVID-19: Secondary | ICD-10-CM

## 2022-05-15 DIAGNOSIS — R41841 Cognitive communication deficit: Secondary | ICD-10-CM | POA: Diagnosis not present

## 2022-05-15 DIAGNOSIS — Z7984 Long term (current) use of oral hypoglycemic drugs: Secondary | ICD-10-CM | POA: Diagnosis not present

## 2022-05-15 DIAGNOSIS — G9340 Encephalopathy, unspecified: Secondary | ICD-10-CM | POA: Diagnosis present

## 2022-05-15 DIAGNOSIS — R739 Hyperglycemia, unspecified: Secondary | ICD-10-CM | POA: Diagnosis not present

## 2022-05-15 DIAGNOSIS — Z66 Do not resuscitate: Secondary | ICD-10-CM | POA: Diagnosis present

## 2022-05-15 DIAGNOSIS — R509 Fever, unspecified: Secondary | ICD-10-CM | POA: Diagnosis not present

## 2022-05-15 DIAGNOSIS — A419 Sepsis, unspecified organism: Secondary | ICD-10-CM | POA: Diagnosis not present

## 2022-05-15 DIAGNOSIS — J929 Pleural plaque without asbestos: Secondary | ICD-10-CM | POA: Diagnosis not present

## 2022-05-15 DIAGNOSIS — M6259 Muscle wasting and atrophy, not elsewhere classified, multiple sites: Secondary | ICD-10-CM | POA: Diagnosis not present

## 2022-05-15 DIAGNOSIS — Z86718 Personal history of other venous thrombosis and embolism: Secondary | ICD-10-CM | POA: Diagnosis not present

## 2022-05-15 DIAGNOSIS — N3000 Acute cystitis without hematuria: Principal | ICD-10-CM | POA: Diagnosis present

## 2022-05-15 DIAGNOSIS — J449 Chronic obstructive pulmonary disease, unspecified: Secondary | ICD-10-CM | POA: Diagnosis present

## 2022-05-15 DIAGNOSIS — R42 Dizziness and giddiness: Secondary | ICD-10-CM | POA: Diagnosis not present

## 2022-05-15 DIAGNOSIS — E1165 Type 2 diabetes mellitus with hyperglycemia: Secondary | ICD-10-CM | POA: Diagnosis present

## 2022-05-15 DIAGNOSIS — Z9981 Dependence on supplemental oxygen: Secondary | ICD-10-CM

## 2022-05-15 DIAGNOSIS — E119 Type 2 diabetes mellitus without complications: Secondary | ICD-10-CM | POA: Diagnosis not present

## 2022-05-15 DIAGNOSIS — Z7982 Long term (current) use of aspirin: Secondary | ICD-10-CM

## 2022-05-15 DIAGNOSIS — G9341 Metabolic encephalopathy: Secondary | ICD-10-CM | POA: Diagnosis present

## 2022-05-15 DIAGNOSIS — R4182 Altered mental status, unspecified: Secondary | ICD-10-CM | POA: Diagnosis not present

## 2022-05-15 DIAGNOSIS — F0390 Unspecified dementia without behavioral disturbance: Secondary | ICD-10-CM | POA: Diagnosis not present

## 2022-05-15 DIAGNOSIS — R531 Weakness: Secondary | ICD-10-CM | POA: Diagnosis not present

## 2022-05-15 DIAGNOSIS — Z7951 Long term (current) use of inhaled steroids: Secondary | ICD-10-CM

## 2022-05-15 DIAGNOSIS — F17210 Nicotine dependence, cigarettes, uncomplicated: Secondary | ICD-10-CM | POA: Diagnosis present

## 2022-05-15 DIAGNOSIS — Z741 Need for assistance with personal care: Secondary | ICD-10-CM | POA: Diagnosis not present

## 2022-05-15 DIAGNOSIS — K5730 Diverticulosis of large intestine without perforation or abscess without bleeding: Secondary | ICD-10-CM | POA: Diagnosis not present

## 2022-05-15 DIAGNOSIS — M6281 Muscle weakness (generalized): Secondary | ICD-10-CM | POA: Diagnosis not present

## 2022-05-15 DIAGNOSIS — M810 Age-related osteoporosis without current pathological fracture: Secondary | ICD-10-CM | POA: Diagnosis present

## 2022-05-15 DIAGNOSIS — Z79899 Other long term (current) drug therapy: Secondary | ICD-10-CM | POA: Diagnosis not present

## 2022-05-15 DIAGNOSIS — E876 Hypokalemia: Secondary | ICD-10-CM | POA: Diagnosis present

## 2022-05-15 DIAGNOSIS — I10 Essential (primary) hypertension: Secondary | ICD-10-CM | POA: Diagnosis present

## 2022-05-15 DIAGNOSIS — Z7401 Bed confinement status: Secondary | ICD-10-CM | POA: Diagnosis not present

## 2022-05-15 DIAGNOSIS — Z981 Arthrodesis status: Secondary | ICD-10-CM

## 2022-05-15 DIAGNOSIS — K219 Gastro-esophageal reflux disease without esophagitis: Secondary | ICD-10-CM | POA: Diagnosis present

## 2022-05-15 DIAGNOSIS — J479 Bronchiectasis, uncomplicated: Secondary | ICD-10-CM | POA: Diagnosis not present

## 2022-05-15 DIAGNOSIS — E785 Hyperlipidemia, unspecified: Secondary | ICD-10-CM | POA: Diagnosis present

## 2022-05-15 DIAGNOSIS — R410 Disorientation, unspecified: Secondary | ICD-10-CM | POA: Diagnosis not present

## 2022-05-15 DIAGNOSIS — Z800 Family history of malignant neoplasm of digestive organs: Secondary | ICD-10-CM | POA: Diagnosis not present

## 2022-05-15 DIAGNOSIS — R2681 Unsteadiness on feet: Secondary | ICD-10-CM | POA: Diagnosis not present

## 2022-05-15 DIAGNOSIS — J9811 Atelectasis: Secondary | ICD-10-CM | POA: Diagnosis not present

## 2022-05-15 DIAGNOSIS — N390 Urinary tract infection, site not specified: Secondary | ICD-10-CM | POA: Diagnosis not present

## 2022-05-15 DIAGNOSIS — D259 Leiomyoma of uterus, unspecified: Secondary | ICD-10-CM | POA: Diagnosis not present

## 2022-05-15 DIAGNOSIS — N200 Calculus of kidney: Secondary | ICD-10-CM | POA: Diagnosis not present

## 2022-05-15 DIAGNOSIS — J431 Panlobular emphysema: Secondary | ICD-10-CM | POA: Diagnosis not present

## 2022-05-15 LAB — URINALYSIS, ROUTINE W REFLEX MICROSCOPIC
Bilirubin Urine: NEGATIVE
Glucose, UA: >=500 mg/dL — AB
Hgb urine dipstick: NEGATIVE
Ketones, ur: 5 mg/dL — AB
Nitrite: POSITIVE — AB
Protein, ur: 30 mg/dL — AB
Specific Gravity, Urine: 1.02 (ref 1.005–1.030)
pH: 6 (ref 5.0–8.0)

## 2022-05-15 LAB — COMPREHENSIVE METABOLIC PANEL
ALT: 49 U/L — ABNORMAL HIGH (ref 0–44)
AST: 38 U/L (ref 15–41)
Albumin: 3.2 g/dL — ABNORMAL LOW (ref 3.5–5.0)
Alkaline Phosphatase: 47 U/L (ref 38–126)
Anion gap: 11 (ref 5–15)
BUN: 21 mg/dL (ref 8–23)
CO2: 21 mmol/L — ABNORMAL LOW (ref 22–32)
Calcium: 8.4 mg/dL — ABNORMAL LOW (ref 8.9–10.3)
Chloride: 104 mmol/L (ref 98–111)
Creatinine, Ser: 0.76 mg/dL (ref 0.44–1.00)
GFR, Estimated: >60 mL/min (ref >60)
Glucose, Bld: 261 mg/dL — ABNORMAL HIGH (ref 70–99)
Potassium: 3.4 mmol/L — ABNORMAL LOW (ref 3.5–5.1)
Sodium: 136 mmol/L (ref 135–145)
Total Bilirubin: 0.3 mg/dL (ref 0.3–1.2)
Total Protein: 6.3 g/dL — ABNORMAL LOW (ref 6.5–8.1)

## 2022-05-15 LAB — CBC WITH DIFFERENTIAL/PLATELET
Abs Immature Granulocytes: 0.08 10*3/uL — ABNORMAL HIGH (ref 0.00–0.07)
Basophils Absolute: 0 10*3/uL (ref 0.0–0.1)
Basophils Relative: 1 %
Eosinophils Absolute: 0 10*3/uL (ref 0.0–0.5)
Eosinophils Relative: 0 %
HCT: 38.2 % (ref 36.0–46.0)
Hemoglobin: 11.4 g/dL — ABNORMAL LOW (ref 12.0–15.0)
Immature Granulocytes: 1 %
Lymphocytes Relative: 4 %
Lymphs Abs: 0.3 10*3/uL — ABNORMAL LOW (ref 0.7–4.0)
MCH: 28.2 pg (ref 26.0–34.0)
MCHC: 29.8 g/dL — ABNORMAL LOW (ref 30.0–36.0)
MCV: 94.6 fL (ref 80.0–100.0)
Monocytes Absolute: 0.8 10*3/uL (ref 0.1–1.0)
Monocytes Relative: 9 %
Neutro Abs: 7.1 10*3/uL (ref 1.7–7.7)
Neutrophils Relative %: 85 %
Platelets: 385 10*3/uL (ref 150–400)
RBC: 4.04 MIL/uL (ref 3.87–5.11)
RDW: 14.6 % (ref 11.5–15.5)
WBC: 8.4 10*3/uL (ref 4.0–10.5)
nRBC: 0 % (ref 0.0–0.2)

## 2022-05-15 LAB — RESP PANEL BY RT-PCR (RSV, FLU A&B, COVID)  RVPGX2
Influenza A by PCR: NEGATIVE
Influenza B by PCR: NEGATIVE
Resp Syncytial Virus by PCR: NEGATIVE
SARS Coronavirus 2 by RT PCR: NEGATIVE

## 2022-05-15 LAB — MAGNESIUM: Magnesium: 1.5 mg/dL — ABNORMAL LOW (ref 1.7–2.4)

## 2022-05-15 LAB — PHOSPHORUS: Phosphorus: 1.9 mg/dL — ABNORMAL LOW (ref 2.5–4.6)

## 2022-05-15 MED ORDER — SODIUM CHLORIDE 0.9 % IV SOLN
1.0000 g | Freq: Once | INTRAVENOUS | Status: AC
  Administered 2022-05-15: 1 g via INTRAVENOUS
  Filled 2022-05-15: qty 10

## 2022-05-15 MED ORDER — IOHEXOL 350 MG/ML SOLN
75.0000 mL | Freq: Once | INTRAVENOUS | Status: AC | PRN
  Administered 2022-05-15: 75 mL via INTRAVENOUS

## 2022-05-15 MED ORDER — MAGNESIUM SULFATE 2 GM/50ML IV SOLN
2.0000 g | Freq: Once | INTRAVENOUS | Status: AC
  Administered 2022-05-15: 2 g via INTRAVENOUS
  Filled 2022-05-15: qty 50

## 2022-05-15 NOTE — ED Provider Notes (Signed)
[facility] Provider Note   CSN: [id number] Arrival date & time: 05/15/22  2026     History {Add pertinent medical, surgical, social history, OB history to HPI:1} Chief Complaint  Patient presents with   Weakness   Altered Mental Status    Briana Barr is a 82 y.o. female.  Briana Barr is an 82 year old female medical history of COPD on 6 L home O2, type 2 diabetes scented to the emergency department for altered mental status.  Patient lives at home with her son usually able to ambulate around the house has baseline dementia but is usually able to state name, date of birth and year.  Patient's family states that she has become increasingly altered at home and confused unable to perform her basic tasks.  States that she has had some urinary incontinence over the past 2 days and had an episode of vomiting on Wednesday.  They deny any acute falls or trauma.  In the room patient is pleasant she denies any acute abdominal pain is able to tell me her name and her birthday.  She has no acute complaints.    Weakness Altered Mental Status Associated symptoms: weakness        Home Medications Prior to Admission medications   Medication Sig Start Date End Date Taking? Authorizing Provider  acetaZOLAMIDE (DIAMOX) 250 MG tablet Take 1 tablet (250 mg total) by mouth every morning. 03/19/22   [name], MD  albuterol (PROAIR HFA) 108 (90 Base) MCG/ACT inhaler Inhale 2 puffs into the lungs every 6 (six) hours as needed for wheezing or shortness of breath. 03/06/19   Alycia Rossetti, MD  albuterol (PROVENTIL) (2.5 MG/3ML) 0.083% nebulizer solution INHALE THE CONTENTS OF 1 VIAL VIA NEBULIZATION EVERY 6 HOURS AS NEEDED FOR [facility] AND SHORTNESS OF BREATH 02/23/22   [name], MD  albuterol (PROVENTIL) (2.5 MG/3ML) 0.083% nebulizer solution Inhale THE contents of one vial (21ms) by nebulization every SIX hours AS NEEDED FOR WHEEZING  AND/OR SHORTNESS OF BREATH 03/02/22   PSusy Frizzle MD  aspirin 81 MG tablet Take 81 mg by mouth daily.    [provider]  cephALEXin (KEFLEX) 500 MG capsule Take 1 capsule (500 mg total) by mouth 3 (three) times daily. 01/27/22   PSusy Frizzle MD  Cholecalciferol (VITAMIN D3) 250 MCG (10000 UT) capsule Take 1 capsule (10,000 Units total) by mouth daily. 01/20/19   PSusy Frizzle MD  ELDERBERRY PO Take 1 each by mouth daily.    [provider]  escitalopram (LEXAPRO) 10 MG tablet Take 1 tablet (10 mg total) by mouth daily. 04/20/22   PSusy Frizzle MD  Fluticasone-Umeclidin-Vilant (TRELEGY ELLIPTA) 100-62.5-25 MCG/ACT AEPB Inhale 1 Dose into the lungs daily. 03/19/22   PSusy Frizzle MD  glipiZIDE (GLUCOTROL XL) 5 MG 24 hr tablet Take 1 tablet (5 mg total) by mouth every morning. 02/17/22   PSusy Frizzle MD  HYDROcodone-acetaminophen (NORCO/VICODIN) 5-325 MG tablet TAKE ONE TABLET BY MOUTH every SIX hours AS NEEDED FOR moderate pain 04/20/22   PSusy Frizzle MD  losartan (COZAAR) 50 MG tablet Take 1 tablet (50 mg total) by mouth daily. 02/17/22   PSusy Frizzle MD  metFORMIN (GLUCOPHAGE) 1000 MG tablet TAKE ONE TABLET BY MOUTH TWICE DAILY WITH FOOD 08/27/21   PSusy Frizzle MD  metFORMIN (GLUCOPHAGE) 1000 MG tablet Take 1 tablet (1,000 mg total) by mouth 2 (two) times daily with a meal.  11/18/21   [name], MD  Multiple Vitamins-Minerals (CENTRUM SILVER 50+WOMEN) TABS Take 1 tablet by mouth daily. 01/20/19   [name], MD  omeprazole (PRILOSEC) 20 MG capsule TAKE 1 CAPSULE BY MOUTH TWICE A DAY BEFORE A MEAL 02/17/22   [name], MD  rosuvastatin (CRESTOR) 20 MG tablet Take 1 tablet (20 mg total) by mouth every evening. 03/19/22   [name], MD  sitaGLIPtin (JANUVIA) 100 MG tablet Take 1 tablet (100 mg total) by mouth daily. 03/19/22   [name], MD      Allergies    Vioxx [rofecoxib]    Review of Systems    Review of Systems  Neurological:  Positive for weakness.    Physical Exam Updated Vital Signs BP 132/60   Pulse 91   Temp 99.8 F (37.7 C) (Oral)   Resp (!) 25   SpO2 98%  Physical Exam Vitals and nursing note reviewed.  Constitutional:      General: She is not in acute distress.    Appearance: She is well-developed.  HENT:     Head: Normocephalic and atraumatic.     Nose: No congestion or rhinorrhea.     Mouth/Throat:     Mouth: Mucous membranes are dry.  Eyes:     Conjunctiva/sclera: Conjunctivae normal.  Cardiovascular:     Rate and Rhythm: Normal rate and regular rhythm.     Heart sounds: No murmur heard. Pulmonary:     Effort: Pulmonary effort is normal. No respiratory distress.     Breath sounds: Normal breath sounds.  Abdominal:     General: There is no distension.     Palpations: Abdomen is soft.     Tenderness: There is no abdominal tenderness.  Musculoskeletal:        General: No swelling.     Cervical back: Neck supple.  Skin:    General: Skin is warm and dry.     Capillary Refill: Capillary refill takes less than 2 seconds.  Neurological:     Mental Status: She is alert. She is disoriented.     Motor: Weakness present.  Psychiatric:        Mood and Affect: Mood normal.     ED Results / Procedures / Treatments   Labs (all labs ordered are listed, but only abnormal results are displayed) Labs Reviewed - No data to display  EKG None  Radiology No results found.  Procedures Procedures  {Document cardiac monitor, telemetry assessment procedure when appropriate:1}  Medications Ordered in ED Medications - No data to display  ED Course/ Medical Decision Making/ A&P Clinical Course as of 05/15/22 2334  Fri May 15, 2022  2332 Phosphorus(!): 1.9 [JS]  2332 Phosphorus(!) [JS]    Clinical Course User Index [JS] Donzetta Matters, MD   {   Click here for ABCD2, HEART and other calculatorsREFRESH Note before signing :1}                           Medical Decision Making Briana Barr is an 82 year old female past medical history as document above presenting to the emergency department with weakness and altered mental status.  Arrival to the emergency department patient is satting 96% on her home 6 L she is normotensive blood pressure 127/63 temperature is 99.8 pulse rate 92.  Patient states she has no acute complaints physical exam is relatively benign no evidence of acute skin infections patient has equal pulses in  all extremities lungs are clear to auscultation no murmurs rubs or gallops.  Principles broad for altered mental status noted female.  Decided to work her up for infection patient has history of UTIs as well as pneumonia.  We also decided workup for ACS given her shortness of breath and history of vomiting 2 to 3 days ago given the fact that she is a type II diabetic she is at risk for atypical chest pain.  Patient's initial EKG shows ST segment elevation 2 3 aVF which is patient's baseline per review of previous EKG she has no reciprocal ST segment depressions.  Further patient denies chest pain at this time therefore my concern for acute ACS.  In terms of infectious etiology we obtained a CBC.  Patient's white blood cells 8.4 no neutrophilic shift patient's hemoglobin is near baseline platelets 385.  No history ofPneumonia as well as this history of nonspecific abdominal complaints at home I opted to obtain a CT test abdomen pelvis.  Patient CT chest shows no evidence of acute pneumonia or other acute lung findings.  Patient's CT abdomen pelvis shows no acute abnormalities.  Patient does have a history of UTIs so we obtained a urinalysis which shows many bacteria with small leukocyte esterase and positive nitrites.  Given this fact we are concerned for acute UTI which could explain patient's altered mental status we gave her dose of IV ceftriaxone.  In the background patient also appears to have concern for failure to thrive at  home with poor p.o. intake increasing weakness and inability to perform ADLs patient's phosphorus is depressed to 1.9 magnesium is suppressed to 1.5 she has other electrolyte abnormalities including hypokalemia slight depression her bicarbonate decreased calcium and low total protein and low total albumin.  Overall given her tenuous health status she is a suitable candidate for admission for urinary tract infection with potential palliative care consult as well as PT OT.   Amount and/or Complexity of Data Reviewed Labs: ordered. Decision-making details documented in ED Course. Radiology: ordered. ECG/medicine tests: ordered.  Risk Prescription drug management.     {Document critical care time when appropriate:1} {Document review of labs and clinical decision tools ie heart score, Chads2Vasc2 etc:1}  {Document your independent review of radiology images, and any outside records:1} {Document your discussion with family members, caretakers, and with consultants:1} {Document social determinants of health affecting pt's care:1} {Document your decision making why or why not admission, treatments were needed:1} Final Clinical Impression(s) / ED Diagnoses Final diagnoses:  None    Rx / DC Orders ED Discharge Orders     None

## 2022-05-15 NOTE — ED Triage Notes (Signed)
Family called EMS, they report pt has been very weak the last 2 days with increased confusion.

## 2022-05-16 DIAGNOSIS — Z1152 Encounter for screening for COVID-19: Secondary | ICD-10-CM | POA: Diagnosis not present

## 2022-05-16 DIAGNOSIS — J9611 Chronic respiratory failure with hypoxia: Secondary | ICD-10-CM | POA: Diagnosis present

## 2022-05-16 DIAGNOSIS — E876 Hypokalemia: Secondary | ICD-10-CM | POA: Diagnosis present

## 2022-05-16 DIAGNOSIS — R2681 Unsteadiness on feet: Secondary | ICD-10-CM | POA: Diagnosis not present

## 2022-05-16 DIAGNOSIS — E119 Type 2 diabetes mellitus without complications: Secondary | ICD-10-CM

## 2022-05-16 DIAGNOSIS — Z79899 Other long term (current) drug therapy: Secondary | ICD-10-CM | POA: Diagnosis not present

## 2022-05-16 DIAGNOSIS — N3000 Acute cystitis without hematuria: Secondary | ICD-10-CM | POA: Diagnosis present

## 2022-05-16 DIAGNOSIS — Z66 Do not resuscitate: Secondary | ICD-10-CM | POA: Diagnosis present

## 2022-05-16 DIAGNOSIS — K219 Gastro-esophageal reflux disease without esophagitis: Secondary | ICD-10-CM | POA: Diagnosis present

## 2022-05-16 DIAGNOSIS — J431 Panlobular emphysema: Secondary | ICD-10-CM | POA: Diagnosis not present

## 2022-05-16 DIAGNOSIS — M6259 Muscle wasting and atrophy, not elsewhere classified, multiple sites: Secondary | ICD-10-CM | POA: Diagnosis not present

## 2022-05-16 DIAGNOSIS — G9340 Encephalopathy, unspecified: Secondary | ICD-10-CM | POA: Diagnosis present

## 2022-05-16 DIAGNOSIS — I10 Essential (primary) hypertension: Secondary | ICD-10-CM

## 2022-05-16 DIAGNOSIS — R4182 Altered mental status, unspecified: Secondary | ICD-10-CM | POA: Diagnosis not present

## 2022-05-16 DIAGNOSIS — G9341 Metabolic encephalopathy: Secondary | ICD-10-CM | POA: Diagnosis present

## 2022-05-16 DIAGNOSIS — F0390 Unspecified dementia without behavioral disturbance: Secondary | ICD-10-CM | POA: Diagnosis present

## 2022-05-16 DIAGNOSIS — Z7401 Bed confinement status: Secondary | ICD-10-CM | POA: Diagnosis not present

## 2022-05-16 DIAGNOSIS — Z7951 Long term (current) use of inhaled steroids: Secondary | ICD-10-CM | POA: Diagnosis not present

## 2022-05-16 DIAGNOSIS — Z7982 Long term (current) use of aspirin: Secondary | ICD-10-CM | POA: Diagnosis not present

## 2022-05-16 DIAGNOSIS — J449 Chronic obstructive pulmonary disease, unspecified: Secondary | ICD-10-CM | POA: Diagnosis present

## 2022-05-16 DIAGNOSIS — M810 Age-related osteoporosis without current pathological fracture: Secondary | ICD-10-CM | POA: Diagnosis present

## 2022-05-16 DIAGNOSIS — R41841 Cognitive communication deficit: Secondary | ICD-10-CM | POA: Diagnosis not present

## 2022-05-16 DIAGNOSIS — Z9981 Dependence on supplemental oxygen: Secondary | ICD-10-CM | POA: Diagnosis not present

## 2022-05-16 DIAGNOSIS — E1165 Type 2 diabetes mellitus with hyperglycemia: Secondary | ICD-10-CM | POA: Diagnosis present

## 2022-05-16 DIAGNOSIS — B9620 Unspecified Escherichia coli [E. coli] as the cause of diseases classified elsewhere: Secondary | ICD-10-CM | POA: Diagnosis present

## 2022-05-16 DIAGNOSIS — N390 Urinary tract infection, site not specified: Secondary | ICD-10-CM | POA: Diagnosis not present

## 2022-05-16 DIAGNOSIS — Z741 Need for assistance with personal care: Secondary | ICD-10-CM | POA: Diagnosis not present

## 2022-05-16 DIAGNOSIS — Z7984 Long term (current) use of oral hypoglycemic drugs: Secondary | ICD-10-CM | POA: Diagnosis not present

## 2022-05-16 DIAGNOSIS — Z86718 Personal history of other venous thrombosis and embolism: Secondary | ICD-10-CM | POA: Diagnosis not present

## 2022-05-16 DIAGNOSIS — R531 Weakness: Secondary | ICD-10-CM | POA: Diagnosis not present

## 2022-05-16 DIAGNOSIS — F17210 Nicotine dependence, cigarettes, uncomplicated: Secondary | ICD-10-CM | POA: Diagnosis present

## 2022-05-16 DIAGNOSIS — Z800 Family history of malignant neoplasm of digestive organs: Secondary | ICD-10-CM | POA: Diagnosis not present

## 2022-05-16 DIAGNOSIS — M6281 Muscle weakness (generalized): Secondary | ICD-10-CM | POA: Diagnosis not present

## 2022-05-16 DIAGNOSIS — E785 Hyperlipidemia, unspecified: Secondary | ICD-10-CM | POA: Diagnosis present

## 2022-05-16 LAB — CBC
HCT: 40 % (ref 36.0–46.0)
Hemoglobin: 12.3 g/dL (ref 12.0–15.0)
MCH: 28 pg (ref 26.0–34.0)
MCHC: 30.8 g/dL (ref 30.0–36.0)
MCV: 90.9 fL (ref 80.0–100.0)
Platelets: 356 10*3/uL (ref 150–400)
RBC: 4.4 MIL/uL (ref 3.87–5.11)
RDW: 14.5 % (ref 11.5–15.5)
WBC: 6.8 10*3/uL (ref 4.0–10.5)
nRBC: 0 % (ref 0.0–0.2)

## 2022-05-16 LAB — BASIC METABOLIC PANEL
Anion gap: 10 (ref 5–15)
BUN: 13 mg/dL (ref 8–23)
CO2: 24 mmol/L (ref 22–32)
Calcium: 8.4 mg/dL — ABNORMAL LOW (ref 8.9–10.3)
Chloride: 103 mmol/L (ref 98–111)
Creatinine, Ser: 0.66 mg/dL (ref 0.44–1.00)
GFR, Estimated: >60 mL/min (ref >60)
Glucose, Bld: 196 mg/dL — ABNORMAL HIGH (ref 70–99)
Potassium: 3.4 mmol/L — ABNORMAL LOW (ref 3.5–5.1)
Sodium: 137 mmol/L (ref 135–145)

## 2022-05-16 LAB — TROPONIN I (HIGH SENSITIVITY)
Troponin I (High Sensitivity): 19 ng/L — ABNORMAL HIGH (ref <18)
Troponin I (High Sensitivity): 27 ng/L — ABNORMAL HIGH (ref <18)

## 2022-05-16 LAB — GLUCOSE, CAPILLARY
Glucose-Capillary: 187 mg/dL — ABNORMAL HIGH (ref 70–99)
Glucose-Capillary: 267 mg/dL — ABNORMAL HIGH (ref 70–99)
Glucose-Capillary: 221 mg/dL — ABNORMAL HIGH (ref 70–99)

## 2022-05-16 LAB — CBG MONITORING, ED: Glucose-Capillary: 102 mg/dL — ABNORMAL HIGH (ref 70–99)

## 2022-05-16 LAB — BRAIN NATRIURETIC PEPTIDE: B Natriuretic Peptide: 72.6 pg/mL (ref 0.0–100.0)

## 2022-05-16 MED ORDER — ACETAMINOPHEN 325 MG PO TABS: 650.0000 mg | ORAL_TABLET | Freq: Four times a day (QID) | ORAL | Status: DC | PRN

## 2022-05-16 MED ORDER — UMECLIDINIUM BROMIDE 62.5 MCG/ACT IN AEPB
1.0000 | INHALATION_SPRAY | Freq: Every day | RESPIRATORY_TRACT | Status: DC
  Administered 2022-05-16 – 2022-05-19 (×4): 1 via RESPIRATORY_TRACT
  Filled 2022-05-16 (×2): qty 7

## 2022-05-16 MED ORDER — ONDANSETRON HCL 4 MG PO TABS: 4.0000 mg | ORAL_TABLET | Freq: Four times a day (QID) | ORAL | Status: DC | PRN

## 2022-05-16 MED ORDER — INSULIN ASPART 100 UNIT/ML IJ SOLN
0.0000 [IU] | INTRAMUSCULAR | Status: DC
  Administered 2022-05-16: 3 [IU] via SUBCUTANEOUS
  Administered 2022-05-16: 2 [IU] via SUBCUTANEOUS
  Administered 2022-05-16: 5 [IU] via SUBCUTANEOUS
  Administered 2022-05-17: 2 [IU] via SUBCUTANEOUS
  Administered 2022-05-17: 5 [IU] via SUBCUTANEOUS
  Administered 2022-05-17: 3 [IU] via SUBCUTANEOUS
  Administered 2022-05-17: 9 [IU] via SUBCUTANEOUS
  Administered 2022-05-17: 3 [IU] via SUBCUTANEOUS
  Administered 2022-05-17: 2 [IU] via SUBCUTANEOUS
  Administered 2022-05-18: 5 [IU] via SUBCUTANEOUS
  Administered 2022-05-18: 3 [IU] via SUBCUTANEOUS
  Administered 2022-05-18: 5 [IU] via SUBCUTANEOUS
  Administered 2022-05-18: 2 [IU] via SUBCUTANEOUS
  Administered 2022-05-18 (×2): 7 [IU] via SUBCUTANEOUS
  Administered 2022-05-19: 9 [IU] via SUBCUTANEOUS
  Administered 2022-05-19: 5 [IU] via SUBCUTANEOUS
  Administered 2022-05-19: 3 [IU] via SUBCUTANEOUS

## 2022-05-16 MED ORDER — K PHOS MONO-SOD PHOS DI & MONO 155-852-130 MG PO TABS
500.0000 mg | ORAL_TABLET | Freq: Two times a day (BID) | ORAL | Status: AC
  Administered 2022-05-16: 500 mg via ORAL
  Filled 2022-05-16 (×3): qty 2

## 2022-05-16 MED ORDER — ACETAMINOPHEN 650 MG RE SUPP: 650.0000 mg | Freq: Four times a day (QID) | RECTAL | Status: DC | PRN

## 2022-05-16 MED ORDER — SODIUM CHLORIDE 0.9 % IV SOLN
1.0000 g | INTRAVENOUS | Status: DC
  Administered 2022-05-16 – 2022-05-18 (×3): 1 g via INTRAVENOUS
  Filled 2022-05-16 (×3): qty 10

## 2022-05-16 MED ORDER — ONDANSETRON HCL 4 MG/2ML IJ SOLN: 4.0000 mg | Freq: Four times a day (QID) | INTRAMUSCULAR | Status: DC | PRN

## 2022-05-16 MED ORDER — ALBUTEROL SULFATE (2.5 MG/3ML) 0.083% IN NEBU
3.0000 mL | INHALATION_SOLUTION | Freq: Four times a day (QID) | RESPIRATORY_TRACT | Status: DC | PRN
  Administered 2022-05-18: 3 mL via RESPIRATORY_TRACT
  Filled 2022-05-16: qty 3

## 2022-05-16 MED ORDER — FLUTICASONE FUROATE-VILANTEROL 100-25 MCG/ACT IN AEPB
1.0000 | INHALATION_SPRAY | Freq: Every day | RESPIRATORY_TRACT | Status: DC
  Administered 2022-05-16 – 2022-05-19 (×4): 1 via RESPIRATORY_TRACT
  Filled 2022-05-16 (×2): qty 28

## 2022-05-16 MED ORDER — ENOXAPARIN SODIUM 40 MG/0.4ML IJ SOSY
40.0000 mg | PREFILLED_SYRINGE | INTRAMUSCULAR | Status: DC
  Administered 2022-05-16 – 2022-05-18 (×3): 40 mg via SUBCUTANEOUS
  Filled 2022-05-16 (×3): qty 0.4

## 2022-05-16 NOTE — ED Notes (Signed)
Attempted to call report no answer. Written handoff completed

## 2022-05-16 NOTE — Progress Notes (Signed)
No charge note  Patient seen and examined this morning, admitted overnight, H&P reviewed and I agree with the assessment and plan.  In brief, this is an 82 year old female with COPD and chronic hypoxic respiratory failure on 6 L at home, DM2, dementia, who comes into the hospital for worsening confusion and increased weakness as well as nausea and vomiting.  Family also reports couple episodes of incontinence which is unusual for her, but the main thing that they have noticed is increased weakness with significant ambulating difficulties.  She is doing well this morning, knows that she is in the hospital but does not know why.  She does not know the year.  Family is at bedside and they appreciate slight improvement but still increased confusion and definitely persistent weakness  Principal problem Acute metabolic encephalopathy, increased weakness -likely due to UTI, patient with history of the same.  Started on ceftriaxone, continue, monitor cultures.  PT consult pending.  Not yet back to baseline.  Family okay with SNF/rehab if that is what is recommended  Active problems Dementia-at baseline usually is able to ambulate in the house without difficulties, no longer able to do so in the past few days.  Still weak today  Essential hypertension -awaiting pharmacy rec for home meds  Type 2 diabetes mellitus -not on insulin at home, placed on sliding scale here  Hyperlipidemia-resume statin once confirmed by pharmacy  Hypomagnesemia, hypophosphatemia, hypokalemia -replete and continue to monitor  Troponin elevation-mild, overall flat, not in a pattern consistent with ACS.  Scheduled Meds:  enoxaparin (LOVENOX) injection  40 mg Subcutaneous Q24H   fluticasone furoate-vilanterol  1 puff Inhalation Daily   And   umeclidinium bromide  1 puff Inhalation Daily   insulin aspart  0-9 Units Subcutaneous Q4H   phosphorus  500 mg Oral BID   Continuous Infusions:  cefTRIAXone (ROCEPHIN)  IV      PRN Meds:.acetaminophen **OR** acetaminophen, albuterol, ondansetron **OR** ondansetron (ZOFRAN) IV  Bazil Dhanani M. Cruzita Lederer, MD, PhD [facility]  Between 7 am - 7 pm you can contact me via Amion (for emergencies) or [place] (non urgent matters).  I am not available 7 pm - 7 am, please contact night coverage MD/APP via Amion

## 2022-05-16 NOTE — Assessment & Plan Note (Signed)
6L O2 at baseline. Cont home nebs

## 2022-05-16 NOTE — Assessment & Plan Note (Signed)
Pt with delirium superimposed on chronic dementia. Acute delirium appears to be possibly secondary to UTI based on UA, reported symptoms. Rocephin UCx pending

## 2022-05-16 NOTE — Evaluation (Signed)
Physical Therapy Evaluation Patient Details Name: Briana Barr MRN: [id number] DOB: 08-02-1940 Today's Date: 05/16/2022  History of Present Illness  82 year old female admitted 2/2 with acute metabolic encephalopathy, increased weakness. PMHx: COPD and chronic hypoxic respiratory failure on 6 L at home, DM2, dementia.  Clinical Impression  Pt admitted with above diagnosis. Family in room present and supportive assisting with hx. Pt previously at supervision level due to cognition however needed help with bathing. Has shown a gradual decline in function lately, and then in the past week a much sharper decline in strength and cognition. Lives with a son who is not present today. Unable to care for pt any further due to physical needs. Pt would benefit from SNF but likely need to transition to long-term memory care once she can ambulate safely again. Currently at min assist level for transfers and gait in room. Pt currently with functional limitations due to the deficits listed below (see PT Problem List). Pt will benefit from skilled PT to increase their independence and safety with mobility to allow discharge to the venue listed below.          Recommendations for follow up therapy are one component of a multi-disciplinary discharge planning process, led by the attending physician.  Recommendations may be updated based on patient status, additional functional criteria and insurance authorization.  Follow Up Recommendations Skilled nursing-short term rehab (<3 hours/day) (Will likely need memory care after completion of ST [facility] at [facility].) Can patient physically be transported by private vehicle: Yes    Assistance Recommended at Discharge Frequent or constant Supervision/Assistance  Patient can return home with the following  A little help with walking and/or transfers;A little help with bathing/dressing/bathroom;Assistance with cooking/housework;Direct supervision/assist for medications  management;Direct supervision/assist for financial management;Assist for transportation;Help with stairs or ramp for entrance    Equipment Recommendations None recommended by PT  Recommendations for Other Services       Functional Status Assessment Patient has had a recent decline in their functional status and demonstrates the ability to make significant improvements in function in a reasonable and predictable amount of time.     Precautions / Restrictions Precautions Precautions: Fall Restrictions Weight Bearing Restrictions: No      Mobility  Bed Mobility Overal bed mobility: Needs Assistance Bed Mobility: Supine to Sit     Supine to sit: Min assist, HOB elevated     General bed mobility comments: Min assist to scoot to EOB. able to pull herself up with [facility] elevated    Transfers Overall transfer level: Needs assistance Equipment used: Standard walker Transfers: Sit to/from Stand Sit to Stand: Min assist           General transfer comment: Min assist for boost to stand from bed. Min guard from toilet with use of hand rail. Cues for technique, and use of walker for support.    Ambulation/Gait Ambulation/Gait assistance: Min assist Gait Distance (Feet): 12 Feet (+12) Assistive device: Standard walker Gait Pattern/deviations: Decreased step length - right, Decreased stride length, Shuffle, Narrow base of support Gait velocity: slow Gait velocity interpretation: <1.31 ft/sec, indicative of household ambulator   General Gait Details: Initially required constant min assist to ambulate to bathroom for balance and to facilitate continuous steps. Cues for posture, foot clearance and awareness. Min guard with return to recliner somewhat erratic control of walker. No buckling.  Stairs            Wheelchair Mobility    Modified Rankin (Stroke Patients Only)  Balance Overall balance assessment: Needs assistance Sitting-balance support: No upper extremity  supported, Feet supported Sitting balance-Leahy Scale: Fair     Standing balance support: Bilateral upper extremity supported Standing balance-Leahy Scale: Poor Standing balance comment: Stood while being assisted with pericare, held walker for support.                             Pertinent Vitals/Pain Pain Assessment Pain Assessment: No/denies pain    Home Living Family/patient expects to be discharged to:: Private residence Living Arrangements: Children Available Help at Discharge: Family;Available 24 hours/day (supervision only) Type of Home: Mobile home Home Access: Ramped entrance       Home Layout: One level Home Equipment: Conservation officer, nature (2 wheels);BSC/3in1;Shower seat      Prior Function Prior Level of Function : Needs assist  Cognitive Assist : Mobility (cognitive) Mobility (Cognitive): Step by step cues   Physical Assist : Mobility (physical) Mobility (physical): Bed mobility     ADLs Comments: Needs help getting in/out of shower and with bathing. Son and family assist with all ADLs, mostly due to cognition but more recently needing physical assistance.     Hand Dominance   Dominant Hand: Right    Extremity/Trunk Assessment   Upper Extremity Assessment Upper Extremity Assessment: Defer to OT evaluation    Lower Extremity Assessment Lower Extremity Assessment: Generalized weakness       Communication   Communication: No difficulties  Cognition Arousal/Alertness: Awake/alert Behavior During Therapy: WFL for tasks assessed/performed Overall Cognitive Status: History of cognitive impairments - at baseline                                          General Comments General comments (skin integrity, edema, etc.): pt was incontinent in brief in bed. Assistd with hygience, brief removed. Requested RN place purewick    Exercises     Assessment/Plan    PT Assessment Patient needs continued PT services  PT Problem List  Decreased strength;Decreased activity tolerance;Decreased balance;Decreased mobility;Decreased coordination;Decreased cognition;Decreased knowledge of use of DME;Decreased safety awareness;Decreased knowledge of precautions;Cardiopulmonary status limiting activity       PT Treatment Interventions DME instruction;Gait training;Functional mobility training;Therapeutic activities;Therapeutic exercise;Balance training;Neuromuscular re-education;Cognitive remediation;Patient/family education    PT Goals (Current goals can be found in the Care Plan section)  Acute Rehab PT Goals Patient Stated Goal: None stated by pt. PT Goal Formulation: With patient/family (Go to rehab, look into memory care) Time For Goal Achievement: 05/30/22 Potential to Achieve Goals: Fair    Frequency Min 2X/week     Co-evaluation               AM-PAC PT "6 Clicks" Mobility  Outcome Measure Help needed turning from your back to your side while in a flat bed without using bedrails?: None Help needed moving from lying on your back to sitting on the side of a flat bed without using bedrails?: A Little Help needed moving to and from a bed to a chair (including a wheelchair)?: A Little Help needed standing up from a chair using your arms (e.g., wheelchair or bedside chair)?: A Little Help needed to walk in hospital room?: A Little Help needed climbing 3-5 steps with a railing? : A Lot 6 Click Score: 18    End of Session Equipment Utilized During Treatment: Gait belt Activity Tolerance: Patient tolerated  treatment well Patient left: in chair;with call bell/phone within reach;with nursing/sitter in room;with family/visitor present Nurse Communication: Mobility status;Other (comment) (Informed RN pt will need alarm box for room; none located on unit; pt to be transferred back to bed min assist if pt needs to be left alone; or have alarm box ordered.) PT Visit Diagnosis: Unsteadiness on feet (R26.81);Other  abnormalities of gait and mobility (R26.89);Muscle weakness (generalized) (M62.81);Difficulty in walking, not elsewhere classified (R26.2)    Time: 8006-3494 PT Time Calculation (min) (ACUTE ONLY): 46 min   Charges:   PT Evaluation $PT Eval Low Complexity: 1 Low PT Treatments $Gait Training: 8-22 mins $Therapeutic Activity: 8-22 mins        Candie Mile, PT, DPT Physical Therapist Acute Rehabilitation Services Welcome   Ellouise Newer 05/16/2022, 1:10 PM

## 2022-05-16 NOTE — Assessment & Plan Note (Signed)
Putting on sensitive SSI Q4H for the moment.

## 2022-05-16 NOTE — Assessment & Plan Note (Signed)
Med rec pending at this time. 

## 2022-05-16 NOTE — H&P (Signed)
History and Physical    Patient: Briana Barr ZSW:[id number] DOB: Jun 16, 1940 DOA: 05/15/2022 DOS: the patient was seen and examined on 05/16/2022 PCP: [name], MD  Patient coming from: Home  Chief Complaint:  Chief Complaint  Patient presents with   Weakness   Altered Mental Status   HPI: [name] is a 82 y.o. female with medical history significant of COPD on 6L O2 at baseline, DM2, dementia.  Usually able to ambulate around house, state name, date of birth, and year; however, pt with increasing confusion over past 2 days.  Episode of vomiting on Wed.  No acute falls nor trauma.  She has had urinary incontinence for past 2 days.  Last time she had similar symptoms in Oct 2023, appears to have been secondary to UTI.   Review of Systems: unable to review all systems due to the inability of the patient to answer questions. Past Medical History:  Diagnosis Date   Barrett esophagus    Colon polyps    benign   COPD (chronic obstructive pulmonary disease) (HCC)    Duodenal ulcer    DVT (deep venous thrombosis) (HCC)    left leg x 2   Gallstones    GERD (gastroesophageal reflux disease)    Glaucoma    Hyperlipidemia    Ischemic colitis ([place])    Osteoporosis    Pneumonia    hx of x 2 as a child    Renal cyst, right    Retinopathy, background, proliferative    Past Surgical History:  Procedure Laterality Date   cervical vertebral fusion     c5-6, 6-7   CHOLECYSTECTOMY     COLONOSCOPY WITH PROPOFOL N/A 11/23/2016   Procedure: COLONOSCOPY WITH PROPOFOL;  Surgeon: Ladene Artist, MD;  Location: [facility];  Service: Endoscopy;  Laterality: N/A;   ESOPHAGOGASTRODUODENOSCOPY (EGD) WITH PROPOFOL N/A 11/23/2016   Procedure: ESOPHAGOGASTRODUODENOSCOPY (EGD) WITH PROPOFOL;  Surgeon: Ladene Artist, MD;  Location: [facility];  Service: Endoscopy;  Laterality: N/A;   FEMORAL-POPLITEAL BYPASS GRAFT     IR RADIOLOGIST EVAL & MGMT  06/06/2019   IR RADIOLOGIST EVAL & MGMT   07/02/2020   IR RADIOLOGIST EVAL & MGMT  07/08/2021   Social History:  reports that she has been smoking cigarettes. She has been smoking an average of 1 pack per day. She has never used smokeless tobacco. She reports that she does not drink alcohol and does not use drugs.  Allergies  Allergen Reactions   Vioxx [Rofecoxib] Swelling    Family History  Problem Relation Age of Onset   Other Father        some sort of blood cancer-had to get transfusions   Stomach cancer Maternal Grandmother    Stomach cancer Maternal Grandfather     Prior to Admission medications   Medication Sig Start Date End Date Taking? Authorizing Provider  acetaZOLAMIDE (DIAMOX) 250 MG tablet Take 1 tablet (250 mg total) by mouth every morning. 03/19/22   [name], MD  albuterol (PROAIR HFA) 108 (90 Base) MCG/ACT inhaler Inhale 2 puffs into the lungs every 6 (six) hours as needed for wheezing or shortness of breath. 03/06/19   Alycia Rossetti, MD  albuterol (PROVENTIL) (2.5 MG/3ML) 0.083% nebulizer solution INHALE THE CONTENTS OF 1 VIAL VIA NEBULIZATION EVERY 6 HOURS AS NEEDED FOR [facility] AND SHORTNESS OF BREATH 02/23/22   [name], MD  albuterol (PROVENTIL) (2.5 MG/3ML) 0.083% nebulizer solution Inhale THE contents of one vial (80ms) by nebulization  every SIX hours AS NEEDED FOR WHEEZING AND/OR SHORTNESS OF BREATH 03/02/22   [name], MD  aspirin 81 MG tablet Take 81 mg by mouth daily.    [provider]  cephALEXin (KEFLEX) 500 MG capsule Take 1 capsule (500 mg total) by mouth 3 (three) times daily. 01/27/22   [name], MD  Cholecalciferol (VITAMIN D3) 250 MCG (10000 UT) capsule Take 1 capsule (10,000 Units total) by mouth daily. 01/20/19   [name], MD  ELDERBERRY PO Take 1 each by mouth daily.    [provider]  escitalopram (LEXAPRO) 10 MG tablet Take 1 tablet (10 mg total) by mouth daily. 04/20/22   [name], MD  Fluticasone-Umeclidin-Vilant  (TRELEGY ELLIPTA) 100-62.5-25 MCG/ACT AEPB Inhale 1 Dose into the lungs daily. 03/19/22   [name], MD  glipiZIDE (GLUCOTROL XL) 5 MG 24 hr tablet Take 1 tablet (5 mg total) by mouth every morning. 02/17/22   [name], MD  HYDROcodone-acetaminophen (NORCO/VICODIN) 5-325 MG tablet TAKE ONE TABLET BY MOUTH every SIX hours AS NEEDED FOR moderate pain 04/20/22   [name], MD  losartan (COZAAR) 50 MG tablet Take 1 tablet (50 mg total) by mouth daily. 02/17/22   [name], MD  metFORMIN (GLUCOPHAGE) 1000 MG tablet TAKE ONE TABLET BY MOUTH TWICE DAILY WITH FOOD 08/27/21   [name], MD  metFORMIN (GLUCOPHAGE) 1000 MG tablet Take 1 tablet (1,000 mg total) by mouth 2 (two) times daily with a meal. 11/18/21   [name], MD  Multiple Vitamins-Minerals (CENTRUM SILVER 50+WOMEN) TABS Take 1 tablet by mouth daily. 01/20/19   [name], MD  omeprazole (PRILOSEC) 20 MG capsule TAKE 1 CAPSULE BY MOUTH TWICE A DAY BEFORE A MEAL 02/17/22   [name], MD  rosuvastatin (CRESTOR) 20 MG tablet Take 1 tablet (20 mg total) by mouth every evening. 03/19/22   [name], MD  sitaGLIPtin (JANUVIA) 100 MG tablet Take 1 tablet (100 mg total) by mouth daily. 03/19/22   [name], MD    Physical Exam: Vitals:   05/16/22 0000 05/16/22 0046 05/16/22 0100 05/16/22 0200  BP: (!) 158/71  (!) 142/69 (!) 156/73  Pulse: 90  90 84  Resp: (!) 28  (!) 25 (!) 26  Temp:  99.4 F (37.4 C)    TempSrc:  Oral    SpO2: 96%  96% 96%   Constitutional: NAD, calm, comfortable Respiratory: clear to auscultation bilaterally, no wheezing, no crackles. Normal respiratory effort. No accessory muscle use.  Cardiovascular: Regular rate and rhythm, no murmurs / rubs / gallops. No extremity edema. 2+ pedal pulses. No carotid bruits.  Abdomen: no tenderness, no masses palpated. No hepatosplenomegaly. Bowel sounds positive.  Neurologic: Grossly non-focal Psychiatric: Oriented to self  only  Data Reviewed:    Urinalysis    Component Value Date/Time   COLORURINE YELLOW 05/15/2022 2144   APPEARANCEUR HAZY (A) 05/15/2022 2144   LABSPEC 1.020 05/15/2022 2144   PHURINE 6.0 05/15/2022 2144   GLUCOSEU >=500 (A) 05/15/2022 2144   HGBUR NEGATIVE 05/15/2022 2144   BILIRUBINUR NEGATIVE 05/15/2022 2144   KETONESUR 5 (A) 05/15/2022 2144   PROTEINUR 30 (A) 05/15/2022 2144   NITRITE POSITIVE (A) 05/15/2022 2144   LEUKOCYTESUR SMALL (A) 05/15/2022 2144       Latest Ref Rng & Units 05/15/2022    9:45 PM 01/22/2022    2:23 PM 12/09/2020   11:52 AM  CBC  WBC  4.0 - 10.5 K/uL 8.4  9.2  9.7   Hemoglobin 12.0 - 15.0 g/dL 11.4  12.8  12.0   Hematocrit 36.0 - 46.0 % 38.2  40.5  39.2   Platelets 150 - 400 K/uL 385  521  469       Latest Ref Rng & Units 05/15/2022    9:45 PM 01/22/2022    2:23 PM 12/09/2020   11:52 AM  CMP  Glucose 70 - 99 mg/dL 261  130  121   BUN 8 - 23 mg/dL '21  25  27   '$ Creatinine 0.44 - 1.00 mg/dL 0.76  0.65  0.59   Sodium 135 - 145 mmol/L 136  140  142   Potassium 3.5 - 5.1 mmol/L 3.4  4.6  4.5   Chloride 98 - 111 mmol/L 104  103  103   CO2 22 - 32 mmol/L '21  26  31   '$ Calcium 8.9 - 10.3 mg/dL 8.4  10.0  9.7   Total Protein 6.5 - 8.1 g/dL 6.3  7.2  6.6   Total Bilirubin 0.3 - 1.2 mg/dL 0.3  0.3  0.3   Alkaline Phos 38 - 126 U/L 47     AST 15 - 41 U/L 38  27  15   ALT 0 - 44 U/L 49  34  20    CT CAP: IMPRESSION: 1. No acute intrathoracic, intra-abdominal, intrapelvic abnormality. 2. Hepatic steatosis. 3. Nonobstructive bilateral punctate nephrolithiasis. 4. Colonic diverticulosis with no acute diverticulitis. 5. Degenerative uterine fibroids. 6. Aortic Atherosclerosis (ICD10-I70.0).  CT head = no acute finding  COVID, FLU, RSV negative  Assessment and Plan: * Acute encephalopathy Pt with delirium superimposed on chronic dementia. Acute delirium appears to be possibly secondary to UTI based on UA, reported symptoms. Rocephin UCx  pending  Dementia ([place]) At baseline: Usually able to ambulate around the house is usually able to state name, date of birth and year.  Essential hypertension Med rec pending at this time.  Diabetes mellitus type 2 in nonobese (HCC) Putting on sensitive SSI Q4H for the moment.  COPD (chronic obstructive pulmonary disease) (HCC) 6L O2 at baseline. Cont home nebs      Advance Care Planning:   Code Status: DNR DNR on file, EDP confirmed with son earlier as well.  Consults: None  Family Communication: No family in room  Severity of Illness: The appropriate patient status for this patient is OBSERVATION. Observation status is judged to be reasonable and necessary in order to provide the required intensity of service to ensure the patient's safety. The patient's presenting symptoms, physical exam findings, and initial radiographic and laboratory data in the context of their medical condition is felt to place them at decreased risk for further clinical deterioration. Furthermore, it is anticipated that the patient will be medically stable for discharge from the hospital within 2 midnights of admission.   Author: Etta Quill., DO 05/16/2022 2:48 AM  For on call review [email or web address].

## 2022-05-16 NOTE — Assessment & Plan Note (Signed)
At baseline: Usually able to ambulate around the house is usually able to state name, date of birth and year.

## 2022-05-16 NOTE — ED Notes (Addendum)
ED TO INPATIENT HANDOFF REPORT  ED Nurse Name and Phone #: Delories Heinz  S Name/Age/Gender Neima Duce 82 y.o. female Room/Bed: 020C/020C  Code Status   Code Status: DNR  Home/SNF/Other Home Patient oriented to: self Is this baseline? Yes   Triage Complete: Triage complete  Chief Complaint Acute encephalopathy [G93.40]  Triage Note Family called EMS, they report pt has been very weak the last 2 days with increased confusion.     Allergies Allergies  Allergen Reactions   Vioxx [Rofecoxib] Swelling    Level of Care/Admitting Diagnosis ED Disposition     ED Disposition  Admit   Condition  --   Comment  Hospital Area: [facility] [[id number]]  Level of Care: Med-Surg [16]  May place patient in observation at [facility] or [facility] if equivalent level of care is available:: No  Covid Evaluation: Confirmed COVID Negative  Diagnosis: Acute encephalopathy [185631]  Admitting Physician: Etta Quill [[id number]]  Attending Physician: Etta Quill [phone]          B Medical/Surgery History Past Medical History:  Diagnosis Date   Barrett esophagus    Colon polyps    benign   COPD (chronic obstructive pulmonary disease) ([place])    Duodenal ulcer    DVT (deep venous thrombosis) ([facility])    left leg x 2   Gallstones    GERD (gastroesophageal reflux disease)    Glaucoma    Hyperlipidemia    Ischemic colitis ([place])    Osteoporosis    Pneumonia    hx of x 2 as a child    Renal cyst, right    Retinopathy, background, proliferative    Past Surgical History:  Procedure Laterality Date   cervical vertebral fusion     c5-6, 6-7   CHOLECYSTECTOMY     COLONOSCOPY WITH PROPOFOL N/A 11/23/2016   Procedure: COLONOSCOPY WITH PROPOFOL;  Surgeon: Ladene Artist, MD;  Location: [facility];  Service: Endoscopy;  Laterality: N/A;   ESOPHAGOGASTRODUODENOSCOPY (EGD) WITH PROPOFOL N/A 11/23/2016   Procedure: ESOPHAGOGASTRODUODENOSCOPY (EGD) WITH PROPOFOL;   Surgeon: Ladene Artist, MD;  Location: [facility];  Service: Endoscopy;  Laterality: N/A;   FEMORAL-POPLITEAL BYPASS GRAFT     IR RADIOLOGIST EVAL & MGMT  06/06/2019   IR RADIOLOGIST EVAL & MGMT  07/02/2020   IR RADIOLOGIST EVAL & MGMT  07/08/2021     A IV Location/Drains/Wounds Patient Lines/Drains/Airways Status     Active Line/Drains/Airways     Name Placement date Placement time Site Days   Peripheral IV 03/13/20 Anterior;Left Forearm 03/13/20  2337  Forearm  794   Peripheral IV 03/13/20 Anterior;Right Forearm 03/13/20  2352  Forearm  794   Peripheral IV 05/15/22 18 G Left Forearm 05/15/22  2038  Forearm  1   External Urinary Catheter 03/14/20  1709  --  793            Intake/Output Last 24 hours  Intake/Output Summary (Last 24 hours) at 05/16/2022 8588 Last data filed at 05/16/2022 0045 Gross per 24 hour  Intake 150 ml  Output --  Net 150 ml    Labs/Imaging Results for orders placed or performed during the hospital encounter of 05/15/22 (from the past 48 hour(s))  Urinalysis, Routine w reflex microscopic -Urine, Clean Catch     Status: Abnormal   Collection Time: 05/15/22  9:44 PM  Result Value Ref Range   Color, Urine YELLOW YELLOW   APPearance HAZY (A) CLEAR   Specific Gravity,  Urine 1.020 1.005 - 1.030   pH 6.0 5.0 - 8.0   Glucose, UA >=500 (A) NEGATIVE mg/dL   Hgb urine dipstick NEGATIVE NEGATIVE   Bilirubin Urine NEGATIVE NEGATIVE   Ketones, ur 5 (A) NEGATIVE mg/dL   Protein, ur 30 (A) NEGATIVE mg/dL   Nitrite POSITIVE (A) NEGATIVE   Leukocytes,Ua SMALL (A) NEGATIVE   RBC / HPF 0-5 0 - 5 RBC/hpf   WBC, UA 21-50 0 - 5 WBC/hpf   Bacteria, UA MANY (A) NONE SEEN   Squamous Epithelial / HPF 0-5 0 - 5 /HPF    Comment: Performed at [facility], [place] [address], [place], [place] [address]  CBC with Differential     Status: Abnormal   Collection Time: 05/15/22  9:45 PM  Result Value Ref Range   WBC 8.4 4.0 - 10.5 K/uL   RBC 4.04 3.87 - 5.11 MIL/uL    Hemoglobin 11.4 (L) 12.0 - 15.0 g/dL   HCT 38.2 36.0 - 46.0 %   MCV 94.6 80.0 - 100.0 fL   MCH 28.2 26.0 - 34.0 pg   MCHC 29.8 (L) 30.0 - 36.0 g/dL   RDW 14.6 11.5 - 15.5 %   Platelets 385 150 - 400 K/uL   nRBC 0.0 0.0 - 0.2 %   Neutrophils Relative % 85 %   Neutro Abs 7.1 1.7 - 7.7 K/uL   Lymphocytes Relative 4 %   Lymphs Abs 0.3 (L) 0.7 - 4.0 K/uL   Monocytes Relative 9 %   Monocytes Absolute 0.8 0.1 - 1.0 K/uL   Eosinophils Relative 0 %   Eosinophils Absolute 0.0 0.0 - 0.5 K/uL   Basophils Relative 1 %   Basophils Absolute 0.0 0.0 - 0.1 K/uL   Immature Granulocytes 1 %   Abs Immature Granulocytes 0.08 (H) 0.00 - 0.07 K/uL    Comment: Performed at [facility] [address], [place], [place] [address]  Comprehensive metabolic panel     Status: Abnormal   Collection Time: 05/15/22  9:45 PM  Result Value Ref Range   Sodium 136 135 - 145 mmol/L   Potassium 3.4 (L) 3.5 - 5.1 mmol/L   Chloride 104 98 - 111 mmol/L   CO2 21 (L) 22 - 32 mmol/L   Glucose, Bld 261 (H) 70 - 99 mg/dL    Comment: Glucose reference range applies only to samples taken after fasting for at least 8 hours.   BUN 21 8 - 23 mg/dL   Creatinine, Ser 0.76 0.44 - 1.00 mg/dL   Calcium 8.4 (L) 8.9 - 10.3 mg/dL   Total Protein 6.3 (L) 6.5 - 8.1 g/dL   Albumin 3.2 (L) 3.5 - 5.0 g/dL   AST 38 15 - 41 U/L   ALT 49 (H) 0 - 44 U/L   Alkaline Phosphatase 47 38 - 126 U/L   Total Bilirubin 0.3 0.3 - 1.2 mg/dL   GFR, Estimated >60 >60 mL/min    Comment: (NOTE) Calculated using the CKD-EPI Creatinine Equation (2021)    Anion gap 11 5 - 15    Comment: Performed at [facility], [place] [address], [place], [place] [address]  Magnesium     Status: Abnormal   Collection Time: 05/15/22  9:45 PM  Result Value Ref Range   Magnesium 1.5 (L) 1.7 - 2.4 mg/dL    Comment: Performed at [facility] [address], [place], [place] [address]  Phosphorus     Status: Abnormal  Collection Time: 05/15/22  9:45 PM   Result Value Ref Range   Phosphorus 1.9 (L) 2.5 - 4.6 mg/dL    Comment: Performed at [facility] [address] [place], [place] [address]  Brain natriuretic peptide     Status: None   Collection Time: 05/15/22  9:45 PM  Result Value Ref Range   B Natriuretic Peptide 72.6 0.0 - 100.0 pg/mL    Comment: Performed at [facility] [address] [place], [place] [address]  Troponin I (High Sensitivity)     Status: Abnormal   Collection Time: 05/15/22  9:45 PM  Result Value Ref Range   Troponin I (High Sensitivity) 19 (H) <18 ng/L    Comment: (NOTE) Elevated high sensitivity troponin I (hsTnI) values and significant  changes across serial measurements may suggest ACS but many other  chronic and acute conditions are known to elevate hsTnI results.  Refer to the "Links" section for chest pain algorithms and additional  guidance. Performed at [facility], [place] [address], [place], [place] [address]   Resp panel by RT-PCR (RSV, Flu A&B, Covid) Anterior Nasal Swab     Status: None   Collection Time: 05/15/22  9:47 PM   Specimen: Anterior Nasal Swab  Result Value Ref Range   SARS Coronavirus 2 by RT PCR NEGATIVE NEGATIVE   Influenza A by PCR NEGATIVE NEGATIVE   Influenza B by PCR NEGATIVE NEGATIVE    Comment: (NOTE) The Xpert Xpress SARS-CoV-2/FLU/RSV plus assay is intended as an aid in the diagnosis of influenza from Nasopharyngeal swab specimens and should not be used as a sole basis for treatment. Nasal washings and aspirates are unacceptable for Xpert Xpress SARS-CoV-2/FLU/RSV testing.  Fact Sheet for Patients: EntrepreneurPulse.com.au  Fact Sheet for Healthcare Providers: IncredibleEmployment.be  This test is not yet approved or cleared by the [place] FDA and has been authorized for detection and/or diagnosis of SARS-CoV-2 by FDA under an Emergency Use Authorization (EUA). This EUA will remain in effect (meaning  this test can be used) for the duration of the COVID-19 declaration under Section 564(b)(1) of the Act, 21 U.S.C. section 360bbb-3(b)(1), unless the authorization is terminated or revoked.     Resp Syncytial Virus by PCR NEGATIVE NEGATIVE    Comment: (NOTE) Fact Sheet for Patients: EntrepreneurPulse.com.au  Fact Sheet for Healthcare Providers: IncredibleEmployment.be  This test is not yet approved or cleared by the [place] FDA and has been authorized for detection and/or diagnosis of SARS-CoV-2 by FDA under an Emergency Use Authorization (EUA). This EUA will remain in effect (meaning this test can be used) for the duration of the COVID-19 declaration under Section 564(b)(1) of the Act, 21 U.S.C. section 360bbb-3(b)(1), unless the authorization is terminated or revoked.  Performed at [facility], [place] [address], [place], [place] [address]   Troponin I (High Sensitivity)     Status: Abnormal   Collection Time: 05/16/22 12:03 AM  Result Value Ref Range   Troponin I (High Sensitivity) 27 (H) <18 ng/L    Comment: (NOTE) Elevated high sensitivity troponin I (hsTnI) values and significant  changes across serial measurements may suggest ACS but many other  chronic and acute conditions are known to elevate hsTnI results.  Refer to the "Links" section for chest pain algorithms and additional  guidance. Performed at [facility], [place] [address] [place], [place] [address]    CT CHEST ABDOMEN PELVIS W CONTRAST  Result Date: 05/15/2022 CLINICAL DATA:  Sepsis. Family called EMS, they report pt has been very weak the last 2 days with increased confusion. EXAM: CT CHEST, ABDOMEN, AND PELVIS WITH CONTRAST TECHNIQUE: Multidetector CT imaging of the chest, abdomen and pelvis was performed following the standard protocol during bolus administration of intravenous contrast. RADIATION DOSE REDUCTION: This exam was performed according to the  departmental dose-optimization program which includes automated exposure control, adjustment of the mA and/or kV according to patient size and/or use of iterative reconstruction technique. CONTRAST:  49m OMNIPAQUE IOHEXOL 350 MG/ML SOLN COMPARISON:  None Available. FINDINGS: CT CHEST FINDINGS Cardiovascular: Normal heart size. No significant pericardial effusion. The thoracic aorta is normal in caliber. Severe atherosclerotic plaque of the thoracic aorta. Moderate coronary artery calcifications. The main pulmonary artery is normal in caliber. No central pulmonary embolus. Mediastinum/Nodes: No enlarged mediastinal, hilar, or axillary lymph nodes. Thyroid gland, trachea, and esophagus demonstrate no significant findings. Lungs/Pleura: Mild left lower lobe bronchiectasis. Mild diffuse bronchial wall thickening. Bilateral lower lobe subsegmental atelectasis. No focal consolidation. No pulmonary nodule. No pulmonary mass. No pleural effusion. No pneumothorax. Musculoskeletal: No chest wall abnormality. No suspicious lytic or blastic osseous lesions. No acute displaced fracture. CT ABDOMEN PELVIS FINDINGS Hepatobiliary: The hepatic parenchyma is diffusely hypodense compared to the splenic parenchyma consistent with fatty infiltration. No focal liver abnormality. Status post cholecystectomy. No biliary dilatation. Pancreas: No focal lesion. Normal pancreatic contour. No surrounding inflammatory changes. No main pancreatic ductal dilatation. Spleen: Normal in size without focal abnormality. Adrenals/Urinary Tract: No adrenal nodule bilaterally. Bilateral kidneys enhance symmetrically. Bilateral punctate nephrolithiasis. No ureterolithiasis. No hydronephrosis. No hydroureter. Fluid density lesion within the right kidney likely represents simple renal cyst. Simple renal cysts, in the absence of clinically indicated signs/symptoms, require no independent follow-up. The urinary bladder is unremarkable. On delayed imaging,  there is no urothelial wall thickening and there are no filling defects in the opacified portions of the bilateral collecting systems or ureters. Stomach/Bowel: Stomach is within normal limits. No evidence of bowel wall thickening or dilatation. Colonic diverticulosis. Appendix appears normal. Vascular/Lymphatic: No abdominal aorta or iliac aneurysm. Severe atherosclerotic plaque of the aorta and its branches. No abdominal, pelvic, or inguinal lymphadenopathy. Reproductive: Coarsely calcified masses likely represent degenerative uterine fibroids. Otherwise uterus and bilateral adnexa are unremarkable. Other: No intraperitoneal free fluid. No intraperitoneal free gas. No organized fluid collection. Musculoskeletal: No abdominal wall hernia or abnormality. No suspicious lytic or blastic osseous lesions. No acute displaced fracture. L3 chronic mild anterior wedge compression fracture with large Schmorl node along the superior endplate. Endplate sclerosis and intervertebral disc space vacuum phenomenon of the mid to lower lumbar spine. IMPRESSION: 1. No acute intrathoracic, intra-abdominal, intrapelvic abnormality. 2. Hepatic steatosis. 3. Nonobstructive bilateral punctate nephrolithiasis. 4. Colonic diverticulosis with no acute diverticulitis. 5. Degenerative uterine fibroids. 6. Aortic Atherosclerosis (ICD10-I70.0). Electronically Signed   By: MIven FinnM.D.   On: 05/15/2022 23:27   CT Head Wo Contrast  Result Date: 05/15/2022 CLINICAL DATA:  Mental status change, unknown cause EXAM: CT HEAD WITHOUT CONTRAST TECHNIQUE: Contiguous axial images were obtained from the base of the skull through the vertex without intravenous contrast. RADIATION DOSE REDUCTION: This exam was performed according to the departmental dose-optimization program which includes automated exposure control, adjustment of the mA and/or kV according to patient size and/or use of iterative reconstruction technique. COMPARISON:  None  Available. FINDINGS: Brain: Cerebral ventricle sizes are concordant with the degree of cerebral volume loss. No evidence of large-territorial acute infarction. No parenchymal hemorrhage. No mass lesion.  No extra-axial collection. No mass effect or midline shift. No hydrocephalus. Basilar cisterns are patent. Vascular: No hyperdense vessel. Skull: No acute fracture or focal lesion. Sinuses/Orbits: Paranasal sinuses and mastoid air cells are clear. Right lens replacement. Otherwise the orbits are unremarkable. Other: None. IMPRESSION: No acute intracranial abnormality. Electronically Signed   By: Iven Finn M.D.   On: 05/15/2022 23:11    Pending Labs Unresulted Labs (From admission, onward)     Start     Ordered   05/16/22 0500  CBC  Tomorrow morning,   R        05/16/22 0246   05/16/22 6237  Basic metabolic panel  Tomorrow morning,   R        05/16/22 0246   05/16/22 0247  Urine Culture (for pregnant, neutropenic or urologic patients or patients with an indwelling urinary catheter)  (Urine Culture)  Once,   R       Question:  Indication  Answer:  Altered mental status (if no other cause identified)   05/16/22 0246            Vitals/Pain Today's Vitals   05/16/22 0100 05/16/22 0200 05/16/22 0215 05/16/22 0300  BP: (!) 142/69 (!) 156/73  118/68  Pulse: 90 84  85  Resp: (!) 25 (!) 26    Temp:      TempSrc:      SpO2: 96% 96%  95%  PainSc:   Asleep     Isolation Precautions No active isolations  Medications Medications  insulin aspart (novoLOG) injection 0-9 Units (has no administration in time range)  albuterol (PROVENTIL) (2.5 MG/3ML) 0.083% nebulizer solution 3 mL (has no administration in time range)  fluticasone furoate-vilanterol (BREO ELLIPTA) 100-25 MCG/ACT 1 puff (has no administration in time range)    And  umeclidinium bromide (INCRUSE ELLIPTA) 62.5 MCG/ACT 1 puff (has no administration in time range)  enoxaparin (LOVENOX) injection 40 mg (has no administration in  time range)  acetaminophen (TYLENOL) tablet 650 mg (has no administration in time range)    Or  acetaminophen (TYLENOL) suppository 650 mg (has no administration in time range)  ondansetron (ZOFRAN) tablet 4 mg (has no administration in time range)    Or  ondansetron (ZOFRAN) injection 4 mg (has no administration in time range)  phosphorus (K PHOS NEUTRAL) tablet 500 mg (has no administration in time range)  magnesium sulfate IVPB 2 g 50 mL (0 g Intravenous Stopped 05/15/22 2352)  iohexol (OMNIPAQUE) 350 MG/ML injection 75 mL (75 mLs Intravenous Contrast Given 05/15/22 2306)  cefTRIAXone (ROCEPHIN) 1 g in sodium chloride 0.9 % 100 mL IVPB (0 g Intravenous Stopped 05/16/22 0045)    Mobility non-ambulatory     Focused Assessments  Pt is very confused, Family called EMS d/t weakness but family never came to ED or called. IV in place. Pure wick in place. Dx UTI   R Recommendations: See Admitting Provider Note  Report given to:   Additional Notes:

## 2022-05-17 DIAGNOSIS — G9340 Encephalopathy, unspecified: Secondary | ICD-10-CM | POA: Diagnosis not present

## 2022-05-17 LAB — COMPREHENSIVE METABOLIC PANEL
ALT: 93 U/L — ABNORMAL HIGH (ref 0–44)
AST: 73 U/L — ABNORMAL HIGH (ref 15–41)
Albumin: 3.2 g/dL — ABNORMAL LOW (ref 3.5–5.0)
Alkaline Phosphatase: 53 U/L (ref 38–126)
Anion gap: 11 (ref 5–15)
BUN: 11 mg/dL (ref 8–23)
CO2: 28 mmol/L (ref 22–32)
Calcium: 8.5 mg/dL — ABNORMAL LOW (ref 8.9–10.3)
Chloride: 97 mmol/L — ABNORMAL LOW (ref 98–111)
Creatinine, Ser: 0.8 mg/dL (ref 0.44–1.00)
GFR, Estimated: >60 mL/min (ref >60)
Glucose, Bld: 295 mg/dL — ABNORMAL HIGH (ref 70–99)
Potassium: 2.9 mmol/L — ABNORMAL LOW (ref 3.5–5.1)
Sodium: 136 mmol/L (ref 135–145)
Total Bilirubin: 0.6 mg/dL (ref 0.3–1.2)
Total Protein: 6.8 g/dL (ref 6.5–8.1)

## 2022-05-17 LAB — GLUCOSE, CAPILLARY
Glucose-Capillary: 180 mg/dL — ABNORMAL HIGH (ref 70–99)
Glucose-Capillary: 192 mg/dL — ABNORMAL HIGH (ref 70–99)
Glucose-Capillary: 203 mg/dL — ABNORMAL HIGH (ref 70–99)
Glucose-Capillary: 246 mg/dL — ABNORMAL HIGH (ref 70–99)
Glucose-Capillary: 266 mg/dL — ABNORMAL HIGH (ref 70–99)
Glucose-Capillary: 385 mg/dL — ABNORMAL HIGH (ref 70–99)

## 2022-05-17 LAB — CBC
HCT: 40.1 % (ref 36.0–46.0)
Hemoglobin: 12.8 g/dL (ref 12.0–15.0)
MCH: 28.1 pg (ref 26.0–34.0)
MCHC: 31.9 g/dL (ref 30.0–36.0)
MCV: 87.9 fL (ref 80.0–100.0)
Platelets: 397 10*3/uL (ref 150–400)
RBC: 4.56 MIL/uL (ref 3.87–5.11)
RDW: 14.3 % (ref 11.5–15.5)
WBC: 9.3 10*3/uL (ref 4.0–10.5)
nRBC: 0 % (ref 0.0–0.2)

## 2022-05-17 LAB — MAGNESIUM: Magnesium: 1.9 mg/dL (ref 1.7–2.4)

## 2022-05-17 MED ORDER — POTASSIUM CHLORIDE CRYS ER 20 MEQ PO TBCR
40.0000 meq | EXTENDED_RELEASE_TABLET | ORAL | Status: AC
  Administered 2022-05-17 (×2): 40 meq via ORAL
  Filled 2022-05-17 (×2): qty 2

## 2022-05-17 MED ORDER — ROSUVASTATIN CALCIUM 20 MG PO TABS
20.0000 mg | ORAL_TABLET | Freq: Every evening | ORAL | Status: DC
  Administered 2022-05-17 – 2022-05-18 (×2): 20 mg via ORAL
  Filled 2022-05-17 (×2): qty 1

## 2022-05-17 MED ORDER — ESCITALOPRAM OXALATE 10 MG PO TABS
10.0000 mg | ORAL_TABLET | Freq: Every day | ORAL | Status: DC
  Administered 2022-05-17 – 2022-05-19 (×3): 10 mg via ORAL
  Filled 2022-05-17 (×3): qty 1

## 2022-05-17 MED ORDER — LOSARTAN POTASSIUM 50 MG PO TABS
50.0000 mg | ORAL_TABLET | Freq: Every day | ORAL | Status: DC
  Administered 2022-05-17 – 2022-05-19 (×3): 50 mg via ORAL
  Filled 2022-05-17 (×3): qty 1

## 2022-05-17 MED ORDER — ACETAZOLAMIDE 250 MG PO TABS
250.0000 mg | ORAL_TABLET | Freq: Every morning | ORAL | Status: DC
  Administered 2022-05-17 – 2022-05-19 (×3): 250 mg via ORAL
  Filled 2022-05-17 (×3): qty 1

## 2022-05-17 MED ORDER — ASPIRIN 81 MG PO TBEC
81.0000 mg | DELAYED_RELEASE_TABLET | Freq: Every day | ORAL | Status: DC
  Administered 2022-05-17 – 2022-05-19 (×3): 81 mg via ORAL
  Filled 2022-05-17 (×3): qty 1

## 2022-05-17 NOTE — NC FL2 (Signed)
[place] LEVEL OF CARE FORM     IDENTIFICATION  Patient Name: Briana Barr Birthdate: May 19, 1940 Sex: female Admission Date (Current Location): 05/15/2022  [facility] and [place] Number:  Herbalist and Address:  The . [facility], [place] [address], [place], [place] [address]      Provider Number: [id number]  Attending Physician Name and Address:  Caren Griffins, MD  Relative Name and Phone Number:       Current Level of Care: Hospital Recommended Level of Care: [place] Prior Approval Number:    Date Approved/Denied:   PASRR Number: [id number]  Discharge Plan: SNF    Current Diagnoses: Patient Active Problem List   Diagnosis Date Noted   Dementia ([place]) 05/16/2022   Acute encephalopathy 33/00/7622   Acute metabolic encephalopathy 63/33/5456   CAP (community acquired pneumonia) 03/14/2020   Bacteremia 11/14/2019   Acute respiratory failure with hypoxia ([place]) 11/13/2019   COPD (chronic obstructive pulmonary disease) ([place]) 11/13/2019   Diabetes mellitus type 2 in nonobese ([place]) 11/13/2019   Essential hypertension 11/13/2019   Community acquired pneumonia of left lower lobe of lung    Abnormal CT scan, colon    Benign neoplasm of sigmoid colon    Benign neoplasm of rectum    Loss of weight    Epigastric abdominal tenderness without rebound tenderness    Anorexia     Orientation RESPIRATION BLADDER Height & Weight     Self, Time, Situation, Place  O2 (4-6L [place]) Continent Weight: 161 lb 6 oz (73.2 kg) Height:   (pt cannot remember)  BEHAVIORAL SYMPTOMS/MOOD NEUROLOGICAL BOWEL NUTRITION STATUS      Continent    AMBULATORY STATUS COMMUNICATION OF NEEDS Skin   Extensive Assist   Normal                       Personal Care Assistance Level of Assistance  Bathing, Feeding, Dressing Bathing Assistance: Limited assistance Feeding assistance: Limited assistance Dressing Assistance: Limited assistance      Functional Limitations Info  Sight, Hearing, Speech Sight Info: Impaired Hearing Info: Adequate Speech Info: Adequate    SPECIAL CARE FACTORS FREQUENCY  PT (By licensed PT), OT (By licensed OT)                    Contractures Contractures Info: Not present    Additional Factors Info  Code Status Code Status Info: DNR             Current Medications (05/17/2022):  This is the current hospital active medication list Current Facility-Administered Medications  Medication Dose Route Frequency Provider Last Rate Last Admin   acetaminophen (TYLENOL) tablet 650 mg  650 mg Oral Q6H PRN Etta Quill, DO       Or   acetaminophen (TYLENOL) suppository 650 mg  650 mg Rectal Q6H PRN Etta Quill, DO       acetaZOLAMIDE (DIAMOX) tablet 250 mg  250 mg Oral q morning Gherghe, Costin M, MD       albuterol (PROVENTIL) (2.5 MG/3ML) 0.083% nebulizer solution 3 mL  3 mL Inhalation Q6H PRN Etta Quill, DO       aspirin EC tablet 81 mg  81 mg Oral Daily Gherghe, Costin M, MD       cefTRIAXone (ROCEPHIN) 1 g in sodium chloride 0.9 % 100 mL IVPB  1 g Intravenous Q24H Caren Griffins, MD 200 mL/hr at 05/16/22 1723 1 g  at 05/16/22 1723   enoxaparin (LOVENOX) injection 40 mg  40 mg Subcutaneous Q24H Jennette Kettle M, DO   40 mg at 05/16/22 2002   escitalopram (LEXAPRO) tablet 10 mg  10 mg Oral Daily Gherghe, Costin M, MD       fluticasone furoate-vilanterol (BREO ELLIPTA) 100-25 MCG/ACT 1 puff  1 puff Inhalation Daily Etta Quill, DO   1 puff at 05/17/22 0841   And   umeclidinium bromide (INCRUSE ELLIPTA) 62.5 MCG/ACT 1 puff  1 puff Inhalation Daily Jennette Kettle M, DO   1 puff at 05/17/22 0840   insulin aspart (novoLOG) injection 0-9 Units  0-9 Units Subcutaneous Q4H Etta Quill, DO   2 Units at 05/17/22 8502   losartan (COZAAR) tablet 50 mg  50 mg Oral Daily Caren Griffins, MD       ondansetron [facility]) tablet 4 mg  4 mg Oral Q6H PRN Etta Quill, DO       Or    ondansetron [facility]) injection 4 mg  4 mg Intravenous Q6H PRN Etta Quill, DO       rosuvastatin (CRESTOR) tablet 20 mg  20 mg Oral QPM Caren Griffins, MD         Discharge Medications: Please see discharge summary for a list of discharge medications.  Relevant Imaging Results:  Relevant Lab Results:   Additional Information SS# [id number]  Briana Barr, [place]

## 2022-05-17 NOTE — Progress Notes (Signed)
PROGRESS NOTE  Briana Barr BPZ:[id number] DOB: May 16, 1940 DOA: 05/15/2022 PCP: [name], MD   LOS: 1 day   Brief Narrative / Interim history: 82 year old female with COPD and chronic hypoxic respiratory failure on 6 L at home, DM2, dementia, who comes into the hospital for worsening confusion and increased weakness as well as nausea and vomiting. Family also reports couple episodes of incontinence which is unusual for her, but the main thing that they have noticed is increased weakness with significant ambulating difficulties.   Subjective / 24h Interval events: She has no complaints this morning, no chest pain, no shortness of breath, no abdominal pain, nausea or vomiting.  Remains confused  Assesement and Plan: Principal Problem:   Acute encephalopathy Active Problems:   Dementia ([place])   COPD (chronic obstructive pulmonary disease) (HCC)   Diabetes mellitus type 2 in nonobese [facility])   Essential hypertension   Acute metabolic encephalopathy   Principal problem Principal problem Acute metabolic encephalopathy, increased weakness -likely due to UTI, patient with history of the same.  Started on ceftriaxone, continue, monitor cultures.  PT consult recommending SNF, family in agreement.  Continues to be off baseline, this morning patient showed lack of understanding on how to use the fork and the knife.  Family had to feed her.   Active problems Dementia-at baseline usually is able to ambulate in the house without difficulties, no longer able to do so in the past few days.  Remains quite weak, SNF is recommended, TOC consulted   Essential hypertension -resume home medic patients today   Type 2 diabetes mellitus -not on insulin at home, placed on sliding scale here   Hyperlipidemia-resume statin   Hypomagnesemia, hypophosphatemia, hypokalemia -continue to monitor, repeat labs tomorrow   Troponin elevation-mild, overall flat, not in a pattern consistent with ACS.  On aspirin,  resume today  Scheduled Meds:  enoxaparin (LOVENOX) injection  40 mg Subcutaneous Q24H   fluticasone furoate-vilanterol  1 puff Inhalation Daily   And   umeclidinium bromide  1 puff Inhalation Daily   insulin aspart  0-9 Units Subcutaneous Q4H   Continuous Infusions:  cefTRIAXone (ROCEPHIN)  IV 1 g (05/16/22 1723)   PRN Meds:.acetaminophen **OR** acetaminophen, albuterol, ondansetron **OR** ondansetron (ZOFRAN) IV  Current Outpatient Medications  Medication Instructions   acetaZOLAMIDE (DIAMOX) 250 mg, Oral, Every morning   albuterol (PROAIR HFA) 108 (90 Base) MCG/ACT inhaler 2 puffs, Inhalation, Every 6 hours PRN   albuterol (PROVENTIL) (2.5 MG/3ML) 0.083% nebulizer solution INHALE THE CONTENTS OF 1 VIAL VIA NEBULIZATION EVERY 6 HOURS AS NEEDED FOR [facility] AND SHORTNESS OF BREATH   aspirin 81 mg, Oral, Daily   cephALEXin (KEFLEX) 500 mg, Oral, 3 times daily   ELDERBERRY PO 1 each, Oral, Daily   escitalopram (LEXAPRO) 10 mg, Oral, Daily   Fluticasone-Umeclidin-Vilant (TRELEGY ELLIPTA) 100-62.5-25 MCG/ACT AEPB 1 Dose, Inhalation, Daily   glipiZIDE (GLUCOTROL XL) 5 mg, Oral, Every morning   HYDROcodone-acetaminophen (NORCO/VICODIN) 5-325 MG tablet TAKE ONE TABLET BY MOUTH every SIX hours AS NEEDED FOR moderate pain   losartan (COZAAR) 50 mg, Oral, Daily   metFORMIN (GLUCOPHAGE) 1000 MG tablet TAKE ONE TABLET BY MOUTH TWICE DAILY WITH FOOD   metFORMIN (GLUCOPHAGE) 1,000 mg, Oral, 2 times daily with meals   Multiple Vitamins-Minerals (CENTRUM SILVER 50+WOMEN) TABS 1 tablet, Oral, Daily   omeprazole (PRILOSEC) 20 MG capsule TAKE 1 CAPSULE BY MOUTH TWICE A DAY BEFORE A MEAL   rosuvastatin (CRESTOR) 20 mg, Oral, Every evening   sitaGLIPtin (JANUVIA) 100  mg, Oral, Daily   Vitamin D3 10,000 Units, Oral, Daily    Diet Orders (From admission, onward)     Start     Ordered   05/16/22 1009  Diet regular Fluid consistency: Thin  Diet effective now       Question:  Fluid consistency:   Answer:  Thin   05/16/22 1008            DVT prophylaxis: enoxaparin (LOVENOX) injection 40 mg Start: 05/16/22 2000   Lab Results  Component Value Date   PLT 356 05/16/2022      Code Status: DNR  Family Communication: Son and daughter-in-law at bedside  Status is: Inpatient  Remains inpatient appropriate because: SNF placement  Level of care: Med-Surg  Consultants:  none  Objective: Vitals:   05/16/22 1928 05/17/22 0517 05/17/22 0800 05/17/22 0844  BP: (!) 152/77 (!) 160/83 (!) 150/78   Pulse: 91 88 85   Resp: 18     Temp: 98.5 F (36.9 C) 99.2 F (37.3 C) 98.6 F (37 C)   TempSrc: Oral Oral    SpO2: 96% 96% 97% 97%  Weight:       No intake or output data in the 24 hours ending 05/17/22 0920 Wt Readings from Last 3 Encounters:  05/16/22 73.2 kg  04/01/22 72.6 kg  01/22/22 72.8 kg    Examination:  Constitutional: NAD Eyes: no scleral icterus ENMT: Mucous membranes are moist.  Neck: normal, supple Respiratory: clear to auscultation bilaterally, no wheezing, no crackles. Normal respiratory effort. No accessory muscle use.  Cardiovascular: Regular rate and rhythm, no murmurs / rubs / gallops. No LE edema.  Abdomen: non distended, no tenderness. Bowel sounds positive.  Musculoskeletal: no clubbing / cyanosis.  Skin: no rashes Neurologic: non focal   Data Reviewed: I have independently reviewed following labs and imaging studies   CBC Recent Labs  Lab 05/15/22 2145 05/16/22 0825  WBC 8.4 6.8  HGB 11.4* 12.3  HCT 38.2 40.0  PLT 385 356  MCV 94.6 90.9  MCH 28.2 28.0  MCHC 29.8* 30.8  RDW 14.6 14.5  LYMPHSABS 0.3*  --   MONOABS 0.8  --   EOSABS 0.0  --   BASOSABS 0.0  --     Recent Labs  Lab 05/15/22 2145 05/16/22 0825  NA 136 137  K 3.4* 3.4*  CL 104 103  CO2 21* 24  GLUCOSE 261* 196*  BUN 21 13  CREATININE 0.76 0.66  CALCIUM 8.4* 8.4*  AST 38  --   ALT 49*  --   ALKPHOS 47  --   BILITOT 0.3  --   ALBUMIN 3.2*  --   MG  1.5*  --   BNP 72.6  --     ------------------------------------------------------------------------------------------------------------------ No results for input(s): "CHOL", "HDL", "LDLCALC", "TRIG", "CHOLHDL", "LDLDIRECT" in the last 72 hours.  Lab Results  Component Value Date   HGBA1C 7.4 (H) 01/22/2022   ------------------------------------------------------------------------------------------------------------------ No results for input(s): "TSH", "T4TOTAL", "T3FREE", "THYROIDAB" in the last 72 hours.  Invalid input(s): "FREET3"  Cardiac Enzymes No results for input(s): "CKMB", "TROPONINI", "MYOGLOBIN" in the last 168 hours.  Invalid input(s): "CK" ------------------------------------------------------------------------------------------------------------------    Component Value Date/Time   BNP 72.6 05/15/2022 2145    CBG: Recent Labs  Lab 05/16/22 0918 05/16/22 1641 05/16/22 1957 05/17/22 0019 05/17/22 0305  GLUCAP 187* 267* 221* 180* 192*    Recent Results (from the past 240 hour(s))  Resp panel by RT-PCR (RSV, Flu A&B, Covid) Anterior Nasal  Swab     Status: None   Collection Time: 05/15/22  9:47 PM   Specimen: Anterior Nasal Swab  Result Value Ref Range Status   SARS Coronavirus 2 by RT PCR NEGATIVE NEGATIVE Final   Influenza A by PCR NEGATIVE NEGATIVE Final   Influenza B by PCR NEGATIVE NEGATIVE Final    Comment: (NOTE) The Xpert Xpress SARS-CoV-2/FLU/RSV plus assay is intended as an aid in the diagnosis of influenza from Nasopharyngeal swab specimens and should not be used as a sole basis for treatment. Nasal washings and aspirates are unacceptable for Xpert Xpress SARS-CoV-2/FLU/RSV testing.  Fact Sheet for Patients: EntrepreneurPulse.com.au  Fact Sheet for Healthcare Providers: IncredibleEmployment.be  This test is not yet approved or cleared by the [place] FDA and has been authorized for detection  and/or diagnosis of SARS-CoV-2 by FDA under an Emergency Use Authorization (EUA). This EUA will remain in effect (meaning this test can be used) for the duration of the COVID-19 declaration under Section 564(b)(1) of the Act, 21 U.S.C. section 360bbb-3(b)(1), unless the authorization is terminated or revoked.     Resp Syncytial Virus by PCR NEGATIVE NEGATIVE Final    Comment: (NOTE) Fact Sheet for Patients: EntrepreneurPulse.com.au  Fact Sheet for Healthcare Providers: IncredibleEmployment.be  This test is not yet approved or cleared by the [place] FDA and has been authorized for detection and/or diagnosis of SARS-CoV-2 by FDA under an Emergency Use Authorization (EUA). This EUA will remain in effect (meaning this test can be used) for the duration of the COVID-19 declaration under Section 564(b)(1) of the Act, 21 U.S.C. section 360bbb-3(b)(1), unless the authorization is terminated or revoked.  Performed at [facility], [place] [address] [place], [place] [address]   Urine Culture (for pregnant, neutropenic or urologic patients or patients with an indwelling urinary catheter)     Status: Abnormal (Preliminary result)   Collection Time: 05/16/22  4:47 AM   Specimen: Urine, Clean Catch  Result Value Ref Range Status   Specimen Description URINE, CLEAN CATCH  Final   Special Requests   Final    NONE Performed at [facility], [place] [address], [place], [place] [address]    Culture >=100,000 COLONIES/mL ESCHERICHIA COLI (A)  Final   Report Status PENDING  Incomplete     Radiology Studies: No results found.   Marzetta Board, MD, PhD [facility]  Between 7 am - 7 pm I am available, please contact me via Amion (for emergencies) or Securechat (non urgent messages)  Between 7 pm - 7 am I am not available, please contact night coverage MD/APP via Amion

## 2022-05-17 NOTE — TOC Initial Note (Signed)
Transition of Care [facility]) - Initial/Assessment Note    Patient Details  Name: Briana Barr MRN: [id number] Date of Birth: [birth year]  [facility]) CM/SW Contact:    Amador Cunas,  Phone Number: 05/17/2022, 9:49 AM  Clinical Narrative: SW spoke with pt and pt's dtr re recommendation for SNF. Reviewed SNF placement process and answered questions. Pt and dtr report agreeable to SNF, no preferred facility indicated but dtr plans to research facilities. Will begin SNF search and f/u with offers as available.   Wandra Feinstein, MSW, LCSW [phone] (coverage)                    Expected Discharge Plan: [place] Barriers to Discharge: Continued Medical Work up, SNF Pending bed offer   Patient Goals and CMS Choice   CMS Medicare.gov Compare Post Acute Care list provided to:: Patient Choice offered to / list presented to : Patient, Adult Children      Expected Discharge Plan and Services       Living arrangements for the past 2 months: Single Family Home                                      Prior Living Arrangements/Services Living arrangements for the past 2 months: Single Family Home Lives with:: Adult Children Patient language and need for interpreter reviewed:: No        Need for Family Participation in Patient Care: Yes (Comment) Care giver support system in place?: Yes (comment)   Criminal Activity/Legal Involvement Pertinent to Current Situation/Hospitalization: No - Comment as needed  Activities of Daily Living Home Assistive Devices/Equipment: Walker (specify type) ADL Screening (condition at time of admission) Patient's cognitive ability adequate to safely complete daily activities?: No Is the patient deaf or have difficulty hearing?: No Does the patient have difficulty seeing, even when wearing glasses/contacts?: No Does the patient have difficulty concentrating, remembering, or making decisions?: Yes Patient  able to express need for assistance with ADLs?: Yes Does the patient have difficulty dressing or bathing?: Yes Independently performs ADLs?: No Communication: Independent Dressing (OT): Needs assistance Grooming: Needs assistance Feeding: Appropriate for developmental age Bathing: Needs assistance Is this a change from baseline?: Change from baseline, expected to last <3 days Toileting: Needs assistance Is this a change from baseline?: Change from baseline, expected to last <3 days In/Out Bed: Needs assistance Is this a change from baseline?: Change from baseline, expected to last <3 days Walks in Home: Independent with device (comment) Does the patient have difficulty walking or climbing stairs?: Yes Weakness of Legs: Both Weakness of Arms/Hands: None  Permission Sought/Granted Permission sought to share information with : Chartered certified accountant granted to share information with : Yes, Verbal Permission Granted              Emotional Assessment       Orientation: : Oriented to Self, Oriented to Place, Oriented to  Time, Oriented to Situation Alcohol / Substance Use: Not Applicable Psych Involvement: No (comment)  Admission diagnosis:  Acute cystitis without hematuria [N30.00] Acute encephalopathy [G93.40] Altered mental status, unspecified altered mental status type [E72.09] Acute metabolic encephalopathy [O70.96] Patient Active Problem List   Diagnosis Date Noted   Dementia ([place]) 05/16/2022   Acute encephalopathy 28/36/6294   Acute metabolic encephalopathy 76/54/6503   CAP (community acquired pneumonia) 03/14/2020   Bacteremia 11/14/2019   Acute respiratory failure with  hypoxia ([place]) 11/13/2019   COPD (chronic obstructive pulmonary disease) ([place]) 11/13/2019   Diabetes mellitus type 2 in nonobese [facility]) 11/13/2019   Essential hypertension 11/13/2019   Community acquired pneumonia of left lower lobe of lung    Abnormal CT scan, colon    Benign  neoplasm of sigmoid colon    Benign neoplasm of rectum    Loss of weight    Epigastric abdominal tenderness without rebound tenderness    Anorexia    PCP:  [name], MD Pharmacy:   [facility] - [place], [place] - [address] [place] [address] Phone: [phone] Fax: [phone]  [id number] [place] - [address] [place] [address]Phone: [phone]: [place] - [address] [address]Phone: [phone]: [phone]    Social Determinants of Health (SDOH) Social History: [place] No Food Insecurity (05/16/2022)  Housing: Low Risk  (05/16/2022)  Transportation Needs: No Transportation Needs (05/16/2022)  Utilities: Not At Risk (05/16/2022)  Alcohol Screen: Low Risk  (04/01/2022)  Depression (PHQ2-9): Low Risk  (04/01/2022)  Financial Resource Strain: Low Risk  (04/01/2022)  Physical Activity: Insufficiently Active (04/01/2022)  Social Connections: Moderately Isolated (04/01/2022)  Stress: No Stress Concern Present (04/01/2022)  Tobacco Use: High Risk (05/15/2022)   SDOH Interventions:     Readmission Risk Interventions     No data to display

## 2022-05-18 ENCOUNTER — Telehealth: Payer: Self-pay | Admitting: Pharmacist

## 2022-05-18 ENCOUNTER — Ambulatory Visit: Payer: Medicare Other | Admitting: Family Medicine

## 2022-05-18 DIAGNOSIS — G9340 Encephalopathy, unspecified: Secondary | ICD-10-CM | POA: Diagnosis not present

## 2022-05-18 LAB — MAGNESIUM: Magnesium: 2.3 mg/dL (ref 1.7–2.4)

## 2022-05-18 LAB — CBC
HCT: 45.2 % (ref 36.0–46.0)
Hemoglobin: 13.4 g/dL (ref 12.0–15.0)
MCH: 27.5 pg (ref 26.0–34.0)
MCHC: 29.6 g/dL — ABNORMAL LOW (ref 30.0–36.0)
MCV: 92.8 fL (ref 80.0–100.0)
Platelets: 423 10*3/uL — ABNORMAL HIGH (ref 150–400)
RBC: 4.87 MIL/uL (ref 3.87–5.11)
RDW: 14.4 % (ref 11.5–15.5)
WBC: 7.4 10*3/uL (ref 4.0–10.5)
nRBC: 0 % (ref 0.0–0.2)

## 2022-05-18 LAB — COMPREHENSIVE METABOLIC PANEL
ALT: 106 U/L — ABNORMAL HIGH (ref 0–44)
AST: 61 U/L — ABNORMAL HIGH (ref 15–41)
Albumin: 3.3 g/dL — ABNORMAL LOW (ref 3.5–5.0)
Alkaline Phosphatase: 52 U/L (ref 38–126)
Anion gap: 10 (ref 5–15)
BUN: 15 mg/dL (ref 8–23)
CO2: 24 mmol/L (ref 22–32)
Calcium: 9 mg/dL (ref 8.9–10.3)
Chloride: 108 mmol/L (ref 98–111)
Creatinine, Ser: 0.75 mg/dL (ref 0.44–1.00)
GFR, Estimated: >60 mL/min (ref >60)
Glucose, Bld: 182 mg/dL — ABNORMAL HIGH (ref 70–99)
Potassium: 4.9 mmol/L (ref 3.5–5.1)
Sodium: 142 mmol/L (ref 135–145)
Total Bilirubin: 0.6 mg/dL (ref 0.3–1.2)
Total Protein: 7.2 g/dL (ref 6.5–8.1)

## 2022-05-18 LAB — GLUCOSE, CAPILLARY
Glucose-Capillary: 177 mg/dL — ABNORMAL HIGH (ref 70–99)
Glucose-Capillary: 230 mg/dL — ABNORMAL HIGH (ref 70–99)
Glucose-Capillary: 296 mg/dL — ABNORMAL HIGH (ref 70–99)
Glucose-Capillary: 301 mg/dL — ABNORMAL HIGH (ref 70–99)
Glucose-Capillary: 349 mg/dL — ABNORMAL HIGH (ref 70–99)
Glucose-Capillary: 266 mg/dL — ABNORMAL HIGH (ref 70–99)

## 2022-05-18 LAB — URINE CULTURE: Culture: >=100000 — AB

## 2022-05-18 MED ORDER — HYDROCODONE-ACETAMINOPHEN 5-325 MG PO TABS: 1.0000 | ORAL_TABLET | Freq: Four times a day (QID) | ORAL | 0 refills | Status: DC | PRN

## 2022-05-18 MED ORDER — AMOXICILLIN-POT CLAVULANATE 875-125 MG PO TABS: 1.0000 | ORAL_TABLET | Freq: Two times a day (BID) | ORAL | 0 refills | Status: AC

## 2022-05-18 NOTE — Progress Notes (Unsigned)
Care Management & Coordination Services Pharmacy Team  Reason for Encounter: Medication coordination and delivery  Contacted patient to discuss medications and coordinate delivery from [facility]. {US HC Outreach:[id number]} Cycle dispensing form sent to *** for review.   Last adherence delivery date: 04/28/2022      Patient is due for next adherence delivery on: 05/27/2022  This delivery to include: Adherence Packaging  30 Days  Acetazolamide 250 mg one tablet at breakfast Glipizide ER 5 mg one tablet at breakfast Metformin 100 mg one at breakfast and one at evening meal Rosuvastatin 20 mg one at evening meal Escitalopram 10 mg one at breakfast Losartan Potassium 50 mg one at breakfast Omeprazole 20 mg one at breakfast, one at evening meal Januvia 100 mg one at breakfast Trelegy 100 mcg one puff daily Hydrocodone-APAP 5-325 mg four times daily prn  Patient declined the following medications this month: ***  Refills requested from providers include: Hydrocodone-APAP 5-325 mg four times daily prn  {Delivery date:25786}   Any concerns about your medications? {yes/no:20286}  How often do you forget or accidentally miss a dose? {Missed doses:25554}  Do you use a pillbox? {yes/no:20286}  Is patient in packaging {yes/no:20286}  If yes  What is the date on your next pill pack?  Any concerns or issues with your packaging?   Recent blood pressure readings are as follows:***  Recent blood glucose readings are as follows:***   Chart review: Recent office visits:  None  Recent consult visits:  None  Hospital visits:  05/15/2022 ED to Hospital Admission due to Acute cystitis without hematuria Rocephin UCx pending  Medications: Facility-Administered Encounter Medications as of 05/18/2022  Medication   acetaminophen (TYLENOL) tablet 650 mg   Or   acetaminophen (TYLENOL) suppository 650 mg   acetaZOLAMIDE (DIAMOX) tablet 250 mg   albuterol (PROVENTIL) (2.5 MG/3ML)  0.083% nebulizer solution 3 mL   aspirin EC tablet 81 mg   cefTRIAXone (ROCEPHIN) 1 g in sodium chloride 0.9 % 100 mL IVPB   enoxaparin (LOVENOX) injection 40 mg   escitalopram (LEXAPRO) tablet 10 mg   fluticasone furoate-vilanterol (BREO ELLIPTA) 100-25 MCG/ACT 1 puff   And   umeclidinium bromide (INCRUSE ELLIPTA) 62.5 MCG/ACT 1 puff   insulin aspart (novoLOG) injection 0-9 Units   losartan (COZAAR) tablet 50 mg   ondansetron (ZOFRAN) tablet 4 mg   Or   ondansetron (ZOFRAN) injection 4 mg   rosuvastatin (CRESTOR) tablet 20 mg   Outpatient Encounter Medications as of 05/18/2022  Medication Sig   acetaZOLAMIDE (DIAMOX) 250 MG tablet Take 1 tablet (250 mg total) by mouth every morning.   albuterol (PROAIR HFA) 108 (90 Base) MCG/ACT inhaler Inhale 2 puffs into the lungs every 6 (six) hours as needed for wheezing or shortness of breath.   albuterol (PROVENTIL) (2.5 MG/3ML) 0.083% nebulizer solution INHALE THE CONTENTS OF 1 VIAL VIA NEBULIZATION EVERY 6 HOURS AS NEEDED FOR [facility] AND SHORTNESS OF BREATH (Patient taking differently: Take 2.5 mg by nebulization every 6 (six) hours as needed for wheezing or shortness of breath.)   amoxicillin-clavulanate (AUGMENTIN) 875-125 MG tablet Take 1 tablet by mouth 2 (two) times daily for 5 days.   aspirin 81 MG tablet Take 81 mg by mouth daily.   cephALEXin (KEFLEX) 500 MG capsule Take 1 capsule (500 mg total) by mouth 3 (three) times daily. (Patient not taking: Reported on 05/16/2022)   Cholecalciferol (VITAMIN D3) 250 MCG (10000 UT) capsule Take 1 capsule (10,000 Units total) by mouth daily.   ELDERBERRY  PO Take 1 each by mouth daily.   escitalopram (LEXAPRO) 10 MG tablet Take 1 tablet (10 mg total) by mouth daily.   Fluticasone-Umeclidin-Vilant (TRELEGY ELLIPTA) 100-62.5-25 MCG/ACT AEPB Inhale 1 Dose into the lungs daily. (Patient taking differently: Inhale 1 Dose into the lungs every evening.)   glipiZIDE (GLUCOTROL XL) 5 MG 24 hr tablet Take 1 tablet  (5 mg total) by mouth every morning.   HYDROcodone-acetaminophen (NORCO/VICODIN) 5-325 MG tablet Take 1 tablet by mouth every 6 (six) hours as needed for moderate pain.   losartan (COZAAR) 50 MG tablet Take 1 tablet (50 mg total) by mouth daily.   metFORMIN (GLUCOPHAGE) 1000 MG tablet TAKE ONE TABLET BY MOUTH TWICE DAILY WITH FOOD (Patient taking differently: Take 1,000 mg by mouth 2 (two) times daily with a meal.)   metFORMIN (GLUCOPHAGE) 1000 MG tablet Take 1 tablet (1,000 mg total) by mouth 2 (two) times daily with a meal. (Patient not taking: Reported on 05/16/2022)   Multiple Vitamins-Minerals (CENTRUM SILVER 50+WOMEN) TABS Take 1 tablet by mouth daily.   omeprazole (PRILOSEC) 20 MG capsule TAKE 1 CAPSULE BY MOUTH TWICE A DAY BEFORE A MEAL (Patient taking differently: Take 20 mg by mouth 2 (two) times daily before a meal.)   rosuvastatin (CRESTOR) 20 MG tablet Take 1 tablet (20 mg total) by mouth every evening.   sitaGLIPtin (JANUVIA) 100 MG tablet Take 1 tablet (100 mg total) by mouth daily.   BP Readings from Last 3 Encounters:  05/18/22 (!) 155/83  01/22/22 120/72  05/13/21 126/65    Pulse Readings from Last 3 Encounters:  05/18/22 86  01/22/22 81  05/13/21 81    Lab Results  Component Value Date/Time   HGBA1C 7.4 (H) 01/22/2022 02:23 PM   HGBA1C 7.7 (H) 12/09/2020 11:52 AM   Lab Results  Component Value Date   CREATININE 0.75 05/18/2022   BUN 15 05/18/2022   GFRNONAA >60 05/18/2022   GFRAA 101 12/01/2019   NA 142 05/18/2022   K 4.9 05/18/2022   CALCIUM 9.0 05/18/2022   CO2 24 05/18/2022    Future Appointments  Date Time Provider [place]  05/18/2022 10:00 AM [name], MD [facility]   April D Calhoun, [place] Pharmacist Assistant [phone]

## 2022-05-18 NOTE — Inpatient Diabetes Management (Signed)
Inpatient Diabetes Program Recommendations  AACE/ADA: New Consensus Statement on Inpatient Glycemic Control (2015)  Target Ranges:  Prepandial:   less than 140 mg/dL      Peak postprandial:   less than 180 mg/dL (1-2 hours)      Critically ill patients:  140 - 180 mg/dL   Lab Results  Component Value Date   GLUCAP 296 (H) 05/18/2022   HGBA1C 7.4 (H) 01/22/2022    Latest Reference Range & Units 05/17/22 03:05 05/17/22 11:33 05/17/22 16:11 05/17/22 19:53 05/17/22 23:47 05/18/22 04:20 05/18/22 08:37 05/18/22 11:31  Glucose-Capillary 70 - 99 mg/dL 192 (H) 266 (H) 246 (H) 385 (H) 203 (H) 177 (H) 230 (H) 296 (H)  (H): Data is abnormally high  Diabetes history: DM2 Outpatient Diabetes medications: Glucotrol 5 mg qd, Januvia 100 mg qd, Metformin 1 gm bid Current orders for Inpatient glycemic control: Novolog 0-9 units q 4 hrs.  Inpatient Diabetes Program Recommendations:   While waiting for CIR and oral DM meds held, please consider: -Novolog 2 units tid meal coverage if eats 50% meals  Thank you, Nani Gasser. Kacie Huxtable, RN, MSN, CDE  Diabetes Coordinator Inpatient Glycemic Control Team Team Pager [phone] (8am-5pm) 05/18/2022 2:22 PM

## 2022-05-18 NOTE — Discharge Summary (Signed)
Physician Discharge Summary  Briana Barr NGE:[id number] DOB: [birth year] DOA: 05/15/2022  PCP: [name], MD  Admit date: 05/15/2022 Discharge date: 05/19/2022  Admitted From: home Disposition:  SNF  Recommendations for Outpatient Follow-up:  Follow up with PCP in 1-2 weeks Continue Augmentin for UTI for 5 additional days Palliative follow up at SNF, if she is not progressing / declining recommend consideration of hospice  Home Health: none Equipment/Devices: none  Discharge Condition: stable CODE STATUS: DNR Diet Orders (From admission, onward)     Start     Ordered   05/16/22 1009  Diet regular Fluid consistency: Thin  Diet effective now       Question:  Fluid consistency:  Answer:  Thin   05/16/22 1008            HPI: Per admitting MD, Briana Barr is a 82 y.o. female with medical history significant of COPD on 6L O2 at baseline, DM2, dementia. Usually able to ambulate around house, state name, date of birth, and year; however, pt with increasing confusion over past 2 days.  Episode of vomiting on Wed.  No acute falls nor trauma. She has had urinary incontinence for past 2 days.  Last time she had similar symptoms in Oct 2023, appears to have been secondary to UTI.  Hospital Course / Discharge diagnoses: Principal Problem:   Acute encephalopathy Active Problems:   Dementia (HCC)   COPD (chronic obstructive pulmonary disease) (HCC)   Diabetes mellitus type 2 in nonobese [facility])   Essential hypertension   Acute metabolic encephalopathy   Principal problem Acute metabolic encephalopathy, increased weakness -likely due to UTI, patient with history of the same.  Started on ceftriaxone initially, urine cultures speciated pansensitive E. coli, and she will be narrowed to Augmentin.  Continue for 5 additional days upon discharge.  PT recommends SNF  Active problems Dementia-at baseline usually is able to ambulate in the house without difficulties, no longer able to do  so in the past few days.  Remains quite weak, SNF is recommended Chronic hypoxic respiratory failure -on 6 L, at baseline Essential hypertension -continue home medications  Type 2 diabetes mellitus -continue home medications on discharge Hyperlipidemia-continue statin Hypomagnesemia, hypophosphatemia, hypokalemia -now normalized after repletion Troponin elevation-mild, overall flat, not in a pattern consistent with ACS.  No chest pain.  On aspirin GOC - DNR / DNI confirmed, MOST form given to family upon discharge  Sepsis ruled out   Discharge Instructions   Allergies as of 05/19/2022       Reactions   Vioxx [rofecoxib] Swelling        Medication List     STOP taking these medications    cephALEXin 500 MG capsule Commonly known as: [place] these medications    acetaZOLAMIDE 250 MG tablet Commonly known as: DIAMOX Take 1 tablet (250 mg total) by mouth every morning.   albuterol 108 (90 Base) MCG/ACT inhaler Commonly known as: ProAir HFA Inhale 2 puffs into the lungs every 6 (six) hours as needed for wheezing or shortness of breath. What changed: Another medication with the same name was changed. Make sure you understand how and when to take each.   albuterol (2.5 MG/3ML) 0.083% nebulizer solution Commonly known as: PROVENTIL INHALE THE CONTENTS OF 1 VIAL VIA NEBULIZATION EVERY 6 HOURS AS NEEDED FOR [facility] AND SHORTNESS OF BREATH What changed: See the new instructions.   amoxicillin-clavulanate 875-125 MG tablet Commonly known as: AUGMENTIN Take 1 tablet  by mouth 2 (two) times daily for 5 days.   aspirin 81 MG tablet Take 81 mg by mouth daily.   Centrum Silver 50+Women Tabs Take 1 tablet by mouth daily.   ELDERBERRY PO Take 1 each by mouth daily.   escitalopram 10 MG tablet Commonly known as: LEXAPRO Take 1 tablet (10 mg total) by mouth daily.   glipiZIDE 5 MG 24 hr tablet Commonly known as: GLUCOTROL XL Take 1 tablet (5 mg total) by mouth  every morning.   HYDROcodone-acetaminophen 5-325 MG tablet Commonly known as: NORCO/VICODIN Take 1 tablet by mouth every 6 (six) hours as needed for moderate pain.   losartan 50 MG tablet Commonly known as: COZAAR Take 1 tablet (50 mg total) by mouth daily.   metFORMIN 1000 MG tablet Commonly known as: GLUCOPHAGE TAKE ONE TABLET BY MOUTH TWICE DAILY WITH FOOD What changed: Another medication with the same name was removed. Continue taking this medication, and follow the directions you see here.   omeprazole 20 MG capsule Commonly known as: PRILOSEC TAKE 1 CAPSULE BY MOUTH TWICE A DAY BEFORE A MEAL What changed:  how much to take how to take this when to take this additional instructions   rosuvastatin 20 MG tablet Commonly known as: CRESTOR Take 1 tablet (20 mg total) by mouth every evening.   sitaGLIPtin 100 MG tablet Commonly known as: Januvia Take 1 tablet (100 mg total) by mouth daily.   Trelegy Ellipta 100-62.5-25 MCG/ACT Aepb Generic drug: Fluticasone-Umeclidin-Vilant Inhale 1 Dose into the lungs daily. What changed: when to take this   Vitamin D3 250 MCG (10000 UT) capsule Take 1 capsule (10,000 Units total) by mouth daily.        Contact information for after-discharge care     Destination     [place] [facility] [facility] .   Service: Skilled Nursing Contact information: [address] [place] [phone]                    Consultations: none  Procedures/Studies:  CT CHEST ABDOMEN PELVIS W CONTRAST  Result Date: 05/15/2022 CLINICAL DATA:  Sepsis. Family called EMS, they report pt has been very weak the last 2 days with increased confusion. EXAM: CT CHEST, ABDOMEN, AND PELVIS WITH CONTRAST TECHNIQUE: Multidetector CT imaging of the chest, abdomen and pelvis was performed following the standard protocol during bolus administration of intravenous contrast. RADIATION DOSE REDUCTION: This exam  was performed according to the departmental dose-optimization program which includes automated exposure control, adjustment of the mA and/or kV according to patient size and/or use of iterative reconstruction technique. CONTRAST:  3m OMNIPAQUE IOHEXOL 350 MG/ML SOLN COMPARISON:  None Available. FINDINGS: CT CHEST FINDINGS Cardiovascular: Normal heart size. No significant pericardial effusion. The thoracic aorta is normal in caliber. Severe atherosclerotic plaque of the thoracic aorta. Moderate coronary artery calcifications. The main pulmonary artery is normal in caliber. No central pulmonary embolus. Mediastinum/Nodes: No enlarged mediastinal, hilar, or axillary lymph nodes. Thyroid gland, trachea, and esophagus demonstrate no significant findings. Lungs/Pleura: Mild left lower lobe bronchiectasis. Mild diffuse bronchial wall thickening. Bilateral lower lobe subsegmental atelectasis. No focal consolidation. No pulmonary nodule. No pulmonary mass. No pleural effusion. No pneumothorax. Musculoskeletal: No chest wall abnormality. No suspicious lytic or blastic osseous lesions. No acute displaced fracture. CT ABDOMEN PELVIS FINDINGS Hepatobiliary: The hepatic parenchyma is diffusely hypodense compared to the splenic parenchyma consistent with fatty infiltration. No focal liver abnormality. Status post cholecystectomy. No biliary dilatation. Pancreas:  No focal lesion. Normal pancreatic contour. No surrounding inflammatory changes. No main pancreatic ductal dilatation. Spleen: Normal in size without focal abnormality. Adrenals/Urinary Tract: No adrenal nodule bilaterally. Bilateral kidneys enhance symmetrically. Bilateral punctate nephrolithiasis. No ureterolithiasis. No hydronephrosis. No hydroureter. Fluid density lesion within the right kidney likely represents simple renal cyst. Simple renal cysts, in the absence of clinically indicated signs/symptoms, require no independent follow-up. The urinary bladder is  unremarkable. On delayed imaging, there is no urothelial wall thickening and there are no filling defects in the opacified portions of the bilateral collecting systems or ureters. Stomach/Bowel: Stomach is within normal limits. No evidence of bowel wall thickening or dilatation. Colonic diverticulosis. Appendix appears normal. Vascular/Lymphatic: No abdominal aorta or iliac aneurysm. Severe atherosclerotic plaque of the aorta and its branches. No abdominal, pelvic, or inguinal lymphadenopathy. Reproductive: Coarsely calcified masses likely represent degenerative uterine fibroids. Otherwise uterus and bilateral adnexa are unremarkable. Other: No intraperitoneal free fluid. No intraperitoneal free gas. No organized fluid collection. Musculoskeletal: No abdominal wall hernia or abnormality. No suspicious lytic or blastic osseous lesions. No acute displaced fracture. L3 chronic mild anterior wedge compression fracture with large Schmorl node along the superior endplate. Endplate sclerosis and intervertebral disc space vacuum phenomenon of the mid to lower lumbar spine. IMPRESSION: 1. No acute intrathoracic, intra-abdominal, intrapelvic abnormality. 2. Hepatic steatosis. 3. Nonobstructive bilateral punctate nephrolithiasis. 4. Colonic diverticulosis with no acute diverticulitis. 5. Degenerative uterine fibroids. 6. Aortic Atherosclerosis (ICD10-I70.0). Electronically Signed   By: Iven Finn M.D.   On: 05/15/2022 23:27   CT Head Wo Contrast  Result Date: 05/15/2022 CLINICAL DATA:  Mental status change, unknown cause EXAM: CT HEAD WITHOUT CONTRAST TECHNIQUE: Contiguous axial images were obtained from the base of the skull through the vertex without intravenous contrast. RADIATION DOSE REDUCTION: This exam was performed according to the departmental dose-optimization program which includes automated exposure control, adjustment of the mA and/or kV according to patient size and/or use of iterative reconstruction  technique. COMPARISON:  None Available. FINDINGS: Brain: Cerebral ventricle sizes are concordant with the degree of cerebral volume loss. No evidence of large-territorial acute infarction. No parenchymal hemorrhage. No mass lesion. No extra-axial collection. No mass effect or midline shift. No hydrocephalus. Basilar cisterns are patent. Vascular: No hyperdense vessel. Skull: No acute fracture or focal lesion. Sinuses/Orbits: Paranasal sinuses and mastoid air cells are clear. Right lens replacement. Otherwise the orbits are unremarkable. Other: None. IMPRESSION: No acute intracranial abnormality. Electronically Signed   By: Iven Finn M.D.   On: 05/15/2022 23:11     Subjective: - no chest pain, shortness of breath, no abdominal pain, nausea or vomiting.   Discharge Exam: BP 119/61 (BP Location: Left Arm)   Pulse 92   Temp 98.4 F (36.9 C) (Oral)   Resp 18   Wt 73.2 kg   SpO2 94%   BMI 26.05 kg/m   General: Pt is alert, awake, not in acute distress Cardiovascular: RRR, S1/S2 +, no rubs, no gallops Respiratory: CTA bilaterally, no wheezing, no rhonchi Abdominal: Soft, NT, ND, bowel sounds + Extremities: no edema, no cyanosis    The results of significant diagnostics from this hospitalization (including imaging, microbiology, ancillary and laboratory) are listed below for reference.     Microbiology: Recent Results (from the past 240 hour(s))  Resp panel by RT-PCR (RSV, Flu A&B, Covid) Anterior Nasal Swab     Status: None   Collection Time: 05/15/22  9:47 PM   Specimen: Anterior Nasal Swab  Result Value Ref Range Status  SARS Coronavirus 2 by RT PCR NEGATIVE NEGATIVE Final   Influenza A by PCR NEGATIVE NEGATIVE Final   Influenza B by PCR NEGATIVE NEGATIVE Final    Comment: (NOTE) The Xpert Xpress SARS-CoV-2/FLU/RSV plus assay is intended as an aid in the diagnosis of influenza from Nasopharyngeal swab specimens and should not be used as a sole basis for treatment. Nasal  washings and aspirates are unacceptable for Xpert Xpress SARS-CoV-2/FLU/RSV testing.  Fact Sheet for Patients: EntrepreneurPulse.com.au  Fact Sheet for Healthcare Providers: IncredibleEmployment.be  This test is not yet approved or cleared by the [place] FDA and has been authorized for detection and/or diagnosis of SARS-CoV-2 by FDA under an Emergency Use Authorization (EUA). This EUA will remain in effect (meaning this test can be used) for the duration of the COVID-19 declaration under Section 564(b)(1) of the Act, 21 U.S.C. section 360bbb-3(b)(1), unless the authorization is terminated or revoked.     Resp Syncytial Virus by PCR NEGATIVE NEGATIVE Final    Comment: (NOTE) Fact Sheet for Patients: EntrepreneurPulse.com.au  Fact Sheet for Healthcare Providers: IncredibleEmployment.be  This test is not yet approved or cleared by the [place] FDA and has been authorized for detection and/or diagnosis of SARS-CoV-2 by FDA under an Emergency Use Authorization (EUA). This EUA will remain in effect (meaning this test can be used) for the duration of the COVID-19 declaration under Section 564(b)(1) of the Act, 21 U.S.C. section 360bbb-3(b)(1), unless the authorization is terminated or revoked.  Performed at [facility], [place] [address], [place], [place] [address]   Urine Culture (for pregnant, neutropenic or urologic patients or patients with an indwelling urinary catheter)     Status: Abnormal   Collection Time: 05/16/22  4:47 AM   Specimen: Urine, Clean Catch  Result Value Ref Range Status   Specimen Description URINE, CLEAN CATCH  Final   Special Requests   Final    NONE Performed at [facility], [place] [address], [place], [place] [address]    Culture >=100,000 COLONIES/mL ESCHERICHIA COLI (A)  Final   Report Status 05/18/2022 FINAL  Final   Organism ID, Bacteria ESCHERICHIA COLI  (A)  Final      Susceptibility   Escherichia coli - MIC*    AMPICILLIN 8 SENSITIVE Sensitive     CEFAZOLIN <=4 SENSITIVE Sensitive     CEFEPIME <=0.12 SENSITIVE Sensitive     CEFTRIAXONE <=0.25 SENSITIVE Sensitive     CIPROFLOXACIN <=0.25 SENSITIVE Sensitive     GENTAMICIN <=1 SENSITIVE Sensitive     IMIPENEM <=0.25 SENSITIVE Sensitive     NITROFURANTOIN <=16 SENSITIVE Sensitive     TRIMETH/SULFA <=20 SENSITIVE Sensitive     AMPICILLIN/SULBACTAM <=2 SENSITIVE Sensitive     PIP/TAZO <=4 SENSITIVE Sensitive     * >=100,000 COLONIES/mL ESCHERICHIA COLI     Labs: Basic Metabolic Panel: Recent Labs  Lab 05/15/22 2145 05/16/22 0825 05/17/22 0947 05/18/22 0407  NA 136 137 136 142  K 3.4* 3.4* 2.9* 4.9  CL 104 103 97* 108  CO2 21* '24 28 24  '$ GLUCOSE 261* 196* 295* 182*  BUN '21 13 11 15  '$ CREATININE 0.76 0.66 0.80 0.75  CALCIUM 8.4* 8.4* 8.5* 9.0  MG 1.5*  --  1.9 2.3  PHOS 1.9*  --   --   --    Liver Function Tests: Recent Labs  Lab 05/15/22 2145 05/17/22 0947 05/18/22 0407  AST 38 73* 61*  ALT 49* 93* 106*  ALKPHOS 47 53 52  BILITOT 0.3 0.6 0.6  PROT 6.3* 6.8 7.2  ALBUMIN 3.2* 3.2* 3.3*   CBC: Recent Labs  Lab 05/15/22 2145 05/16/22 0825 05/17/22 0947 05/18/22 0407  WBC 8.4 6.8 9.3 7.4  NEUTROABS 7.1  --   --   --   HGB 11.4* 12.3 12.8 13.4  HCT 38.2 40.0 40.1 45.2  MCV 94.6 90.9 87.9 92.8  PLT 385 356 397 423*   CBG: Recent Labs  Lab 05/18/22 1625 05/18/22 1940 05/18/22 2319 05/19/22 0409 05/19/22 0808  GLUCAP 301* 349* 266* 207* 351*   Hgb A1c No results for input(s): "HGBA1C" in the last 72 hours. Lipid Profile No results for input(s): "CHOL", "HDL", "LDLCALC", "TRIG", "CHOLHDL", "LDLDIRECT" in the last 72 hours. Thyroid function studies No results for input(s): "TSH", "T4TOTAL", "T3FREE", "THYROIDAB" in the last 72 hours.  Invalid input(s): "FREET3" Urinalysis    Component Value Date/Time   COLORURINE YELLOW 05/15/2022 2144    APPEARANCEUR HAZY (A) 05/15/2022 2144   LABSPEC 1.020 05/15/2022 2144   PHURINE 6.0 05/15/2022 2144   GLUCOSEU >=500 (A) 05/15/2022 2144   HGBUR NEGATIVE 05/15/2022 2144   [place] NEGATIVE 05/15/2022 2144   KETONESUR 5 (A) 05/15/2022 2144   PROTEINUR 30 (A) 05/15/2022 2144   NITRITE POSITIVE (A) 05/15/2022 2144   LEUKOCYTESUR SMALL (A) 05/15/2022 2144    FURTHER DISCHARGE INSTRUCTIONS:   Get Medicines reviewed and adjusted: Please take all your medications with you for your next visit with your Primary MD   Laboratory/radiological data: Please request your Primary MD to go over all hospital tests and procedure/radiological results at the follow up, please ask your Primary MD to get all Hospital records sent to his/her office.   In some cases, they will be blood work, cultures and biopsy results pending at the time of your discharge. Please request that your primary care M.D. goes through all the records of your hospital data and follows up on these results.   Also Note the following: If you experience worsening of your admission symptoms, develop shortness of breath, life threatening emergency, suicidal or homicidal thoughts you must seek medical attention immediately by calling 911 or calling your MD immediately  if symptoms less severe.   You must read complete instructions/literature along with all the possible adverse reactions/side effects for all the Medicines you take and that have been prescribed to you. Take any new Medicines after you have completely understood and accpet all the possible adverse reactions/side effects.    Do not drive when taking Pain medications or sleeping medications (Benzodaizepines)   Do not take more than prescribed Pain, Sleep and Anxiety Medications. It is not advisable to combine anxiety,sleep and pain medications without talking with your primary care practitioner   Special Instructions: If you have smoked or chewed Tobacco  in the last 2 yrs  please stop smoking, stop any regular Alcohol  and or any Recreational drug use.   Wear Seat belts while driving.   Please note: You were cared for by a hospitalist during your hospital stay. Once you are discharged, your primary care physician will handle any further medical issues. Please note that NO REFILLS for any discharge medications will be authorized once you are discharged, as it is imperative that you return to your primary care physician (or establish a relationship with a primary care physician if you do not have one) for your post hospital discharge needs so that they can reassess your need for medications and monitor your lab values.  Time coordinating  discharge: 35 minutes  SIGNED:  Marzetta Board, MD, PhD 05/19/2022, 8:59 AM

## 2022-05-18 NOTE — TOC Progression Note (Signed)
Transition of Care [facility]) - Progression Note    Patient Details  Name: Briana Barr MRN: [id number] Date of Birth: 20-Dec-1940  [facility]) CM/SW [place], RN Phone Number: 05/18/2022, 10:13 AM  Clinical Narrative:    CM called and spoke with the patient's son, Anberlin Diez, by phone and offered Medicare choice regarding SNF placement.  The son chose [facility].  I called and left a message with Perrin Smack, MSW at the facility.   Expected Discharge Plan: [place] Barriers to Discharge: Continued Medical Work up, SNF Pending bed offer  Expected Discharge Plan and Services       Living arrangements for the past 2 months: Single Family Home                                       Social Determinants of Health (SDOH) Interventions SDOH Screenings   Food Insecurity: No Food Insecurity (05/16/2022)  Housing: Low Risk  (05/16/2022)  Transportation Needs: No Transportation Needs (05/16/2022)  Utilities: Not At Risk (05/16/2022)  Alcohol Screen: Low Risk  (04/01/2022)  Depression (PHQ2-9): Low Risk  (04/01/2022)  Financial Resource Strain: Low Risk  (04/01/2022)  Physical Activity: Insufficiently Active (04/01/2022)  Social Connections: Moderately Isolated (04/01/2022)  Stress: No Stress Concern Present (04/01/2022)  Tobacco Use: High Risk (05/15/2022)    Readmission Risk Interventions     No data to display

## 2022-05-19 DIAGNOSIS — N390 Urinary tract infection, site not specified: Secondary | ICD-10-CM | POA: Diagnosis not present

## 2022-05-19 DIAGNOSIS — J441 Chronic obstructive pulmonary disease with (acute) exacerbation: Secondary | ICD-10-CM | POA: Diagnosis not present

## 2022-05-19 DIAGNOSIS — F32A Depression, unspecified: Secondary | ICD-10-CM | POA: Diagnosis not present

## 2022-05-19 DIAGNOSIS — Z9981 Dependence on supplemental oxygen: Secondary | ICD-10-CM | POA: Diagnosis not present

## 2022-05-19 DIAGNOSIS — E118 Type 2 diabetes mellitus with unspecified complications: Secondary | ICD-10-CM | POA: Diagnosis not present

## 2022-05-19 DIAGNOSIS — R2681 Unsteadiness on feet: Secondary | ICD-10-CM | POA: Diagnosis not present

## 2022-05-19 DIAGNOSIS — Z981 Arthrodesis status: Secondary | ICD-10-CM | POA: Diagnosis not present

## 2022-05-19 DIAGNOSIS — Z7951 Long term (current) use of inhaled steroids: Secondary | ICD-10-CM | POA: Diagnosis not present

## 2022-05-19 DIAGNOSIS — M6259 Muscle wasting and atrophy, not elsewhere classified, multiple sites: Secondary | ICD-10-CM | POA: Diagnosis not present

## 2022-05-19 DIAGNOSIS — R12 Heartburn: Secondary | ICD-10-CM | POA: Diagnosis not present

## 2022-05-19 DIAGNOSIS — Z1152 Encounter for screening for COVID-19: Secondary | ICD-10-CM | POA: Diagnosis not present

## 2022-05-19 DIAGNOSIS — R0602 Shortness of breath: Secondary | ICD-10-CM | POA: Diagnosis not present

## 2022-05-19 DIAGNOSIS — Z7982 Long term (current) use of aspirin: Secondary | ICD-10-CM | POA: Diagnosis not present

## 2022-05-19 DIAGNOSIS — R630 Anorexia: Secondary | ICD-10-CM | POA: Diagnosis not present

## 2022-05-19 DIAGNOSIS — T380X5A Adverse effect of glucocorticoids and synthetic analogues, initial encounter: Secondary | ICD-10-CM | POA: Diagnosis not present

## 2022-05-19 DIAGNOSIS — Z7984 Long term (current) use of oral hypoglycemic drugs: Secondary | ICD-10-CM | POA: Diagnosis not present

## 2022-05-19 DIAGNOSIS — Z7401 Bed confinement status: Secondary | ICD-10-CM | POA: Diagnosis not present

## 2022-05-19 DIAGNOSIS — G9341 Metabolic encephalopathy: Secondary | ICD-10-CM | POA: Diagnosis not present

## 2022-05-19 DIAGNOSIS — R531 Weakness: Secondary | ICD-10-CM | POA: Diagnosis not present

## 2022-05-19 DIAGNOSIS — E1165 Type 2 diabetes mellitus with hyperglycemia: Secondary | ICD-10-CM | POA: Diagnosis not present

## 2022-05-19 DIAGNOSIS — Z79899 Other long term (current) drug therapy: Secondary | ICD-10-CM | POA: Diagnosis not present

## 2022-05-19 DIAGNOSIS — I110 Hypertensive heart disease with heart failure: Secondary | ICD-10-CM | POA: Diagnosis present

## 2022-05-19 DIAGNOSIS — R000 Tachycardia, unspecified: Secondary | ICD-10-CM | POA: Diagnosis not present

## 2022-05-19 DIAGNOSIS — K219 Gastro-esophageal reflux disease without esophagitis: Secondary | ICD-10-CM | POA: Diagnosis not present

## 2022-05-19 DIAGNOSIS — F339 Major depressive disorder, recurrent, unspecified: Secondary | ICD-10-CM | POA: Diagnosis not present

## 2022-05-19 DIAGNOSIS — Z66 Do not resuscitate: Secondary | ICD-10-CM | POA: Diagnosis present

## 2022-05-19 DIAGNOSIS — R059 Cough, unspecified: Secondary | ICD-10-CM | POA: Diagnosis not present

## 2022-05-19 DIAGNOSIS — E785 Hyperlipidemia, unspecified: Secondary | ICD-10-CM | POA: Diagnosis present

## 2022-05-19 DIAGNOSIS — J449 Chronic obstructive pulmonary disease, unspecified: Secondary | ICD-10-CM | POA: Diagnosis not present

## 2022-05-19 DIAGNOSIS — K2270 Barrett's esophagus without dysplasia: Secondary | ICD-10-CM | POA: Diagnosis present

## 2022-05-19 DIAGNOSIS — E11319 Type 2 diabetes mellitus with unspecified diabetic retinopathy without macular edema: Secondary | ICD-10-CM | POA: Diagnosis present

## 2022-05-19 DIAGNOSIS — Z741 Need for assistance with personal care: Secondary | ICD-10-CM | POA: Diagnosis not present

## 2022-05-19 DIAGNOSIS — R41841 Cognitive communication deficit: Secondary | ICD-10-CM | POA: Diagnosis not present

## 2022-05-19 DIAGNOSIS — R54 Age-related physical debility: Secondary | ICD-10-CM | POA: Diagnosis not present

## 2022-05-19 DIAGNOSIS — E782 Mixed hyperlipidemia: Secondary | ICD-10-CM | POA: Diagnosis not present

## 2022-05-19 DIAGNOSIS — M810 Age-related osteoporosis without current pathological fracture: Secondary | ICD-10-CM | POA: Diagnosis present

## 2022-05-19 DIAGNOSIS — Z515 Encounter for palliative care: Secondary | ICD-10-CM | POA: Diagnosis not present

## 2022-05-19 DIAGNOSIS — E119 Type 2 diabetes mellitus without complications: Secondary | ICD-10-CM | POA: Diagnosis not present

## 2022-05-19 DIAGNOSIS — Z719 Counseling, unspecified: Secondary | ICD-10-CM | POA: Diagnosis not present

## 2022-05-19 DIAGNOSIS — F0390 Unspecified dementia without behavioral disturbance: Secondary | ICD-10-CM | POA: Diagnosis not present

## 2022-05-19 DIAGNOSIS — J9611 Chronic respiratory failure with hypoxia: Secondary | ICD-10-CM | POA: Diagnosis not present

## 2022-05-19 DIAGNOSIS — R062 Wheezing: Secondary | ICD-10-CM | POA: Diagnosis not present

## 2022-05-19 DIAGNOSIS — F0393 Unspecified dementia, unspecified severity, with mood disturbance: Secondary | ICD-10-CM | POA: Diagnosis present

## 2022-05-19 DIAGNOSIS — F17210 Nicotine dependence, cigarettes, uncomplicated: Secondary | ICD-10-CM | POA: Diagnosis present

## 2022-05-19 DIAGNOSIS — I10 Essential (primary) hypertension: Secondary | ICD-10-CM | POA: Diagnosis not present

## 2022-05-19 DIAGNOSIS — I5032 Chronic diastolic (congestive) heart failure: Secondary | ICD-10-CM | POA: Diagnosis not present

## 2022-05-19 DIAGNOSIS — M6281 Muscle weakness (generalized): Secondary | ICD-10-CM | POA: Diagnosis not present

## 2022-05-19 DIAGNOSIS — N3000 Acute cystitis without hematuria: Secondary | ICD-10-CM | POA: Diagnosis not present

## 2022-05-19 DIAGNOSIS — J9811 Atelectasis: Secondary | ICD-10-CM | POA: Diagnosis not present

## 2022-05-19 LAB — GLUCOSE, CAPILLARY
Glucose-Capillary: 207 mg/dL — ABNORMAL HIGH (ref 70–99)
Glucose-Capillary: 351 mg/dL — ABNORMAL HIGH (ref 70–99)
Glucose-Capillary: 292 mg/dL — ABNORMAL HIGH (ref 70–99)

## 2022-05-19 NOTE — Progress Notes (Signed)
Patient discharged with EMS to facility.  Patient left unit with staff in no distress.  Discharge instructions called to facility

## 2022-05-19 NOTE — Care Management Important Message (Signed)
Important Message  Patient Details  Name: Briana Barr MRN: [id number] Date of Birth: Jaislyn 24, 1942   Medicare Important Message Given:  Yes     Tommey Barret 05/19/2022, 3:00 PM

## 2022-05-19 NOTE — Progress Notes (Signed)
Mobility Specialist - Progress Note   05/19/22 1450  Mobility  Activity Ambulated with assistance in room  Level of Assistance Minimal assist, patient does 75% or more  Assistive Device Front wheel walker  Distance Ambulated (ft) 20 ft  Activity Response Tolerated fair  Mobility Referral Yes  $Mobility charge 1 Mobility   Pt received in bed and agreeable to mobility. No complaints throughout. Pt returned to bed with all needs met.  Franki Monte  Mobility Specialist Please contact via Solicitor or Rehab office at [phone]

## 2022-05-19 NOTE — TOC Transition Note (Signed)
Transition of Care [facility]) - CM/SW Discharge Note   Patient Details  Name: Briana Barr MRN: [id number] Date of Birth: 1941-01-16  Transition [facility]) CM/SW Contact:  Curlene Labrum, RN Phone Number: 05/19/2022, 12:35 PM   Clinical Narrative:    CM called and spoke with Perrin Smack, CM at [facility] this morning at 8 am and she plans to have Regional admissions CM with the family for admission paperwork and will coordinate discharge time with me.  Discharge summary and transfer report were uploaded to the the facility this morning.  05/19/2022 1236 - Admission paperwork to [facility] was completed at the hospital and [facility] has available bed for the patient now.  I called [facility] and scheduled ambulance transportation to the facility.  [facility] will arrive for transport around 130 pm.  Family is aware.  Bedside nursing - please call report to [facility] at [phone] for Room 158.   Final next level of care: Skilled Nursing Facility Barriers to Discharge: Continued Medical Work up, SNF Pending bed offer   Patient Goals and CMS Choice CMS Medicare.gov Compare Post Acute Care list provided to:: Patient Choice offered to / list presented to : Patient, Adult Children  Discharge Placement                         Discharge Plan and Services Additional resources added to the After Visit Summary for                                       Social Determinants of Health (SDOH) Interventions SDOH Screenings   Food Insecurity: No Food Insecurity (05/16/2022)  Housing: Low Risk  (05/16/2022)  Transportation Needs: No Transportation Needs (05/16/2022)  Utilities: Not At Risk (05/16/2022)  Alcohol Screen: Low Risk  (04/01/2022)  Depression (PHQ2-9): Low Risk  (04/01/2022)  Financial Resource Strain: Low Risk  (04/01/2022)  Physical Activity: Insufficiently Active (04/01/2022)  Social Connections: Moderately Isolated (04/01/2022)  Stress: No Stress Concern Present  (04/01/2022)  Tobacco Use: High Risk (05/15/2022)     Readmission Risk Interventions     No data to display

## 2022-05-19 NOTE — Progress Notes (Signed)
Physical Therapy Treatment Patient Details Name: Briana Barr MRN: [id number] DOB: [birth year] Today's Date: 05/19/2022   History of Present Illness 82 year old female admitted 05/15/22 with acute metabolic encephalopathy, increased weakness. PMHx: COPD and chronic hypoxic respiratory failure on 6 L at home, DM2, dementia.    PT Comments    Pt pleasant with decreased orientation and awareness. Pt on 2L at rest and 3L with activity maintaining SPO2 >90% with max HR 130 with gait. Pt educated for HEP, progressive activity and gait with D/C plan appropriate and family present end of session. Will continue to follow.     Recommendations for follow up therapy are one component of a multi-disciplinary discharge planning process, led by the attending physician.  Recommendations may be updated based on patient status, additional functional criteria and insurance authorization.  Follow Up Recommendations  Skilled nursing-short term rehab (<3 hours/day) Can patient physically be transported by private vehicle: Yes   Assistance Recommended at Discharge Frequent or constant Supervision/Assistance  Patient can return home with the following A little help with walking and/or transfers;A little help with bathing/dressing/bathroom;Assistance with cooking/housework;Direct supervision/assist for medications management;Direct supervision/assist for financial management;Assist for transportation;Help with stairs or ramp for entrance   Equipment Recommendations  None recommended by PT    Recommendations for Other Services       Precautions / Restrictions Precautions Precautions: Fall;Other (comment) Precaution Comments: watch sats     Mobility  Bed Mobility Overal bed mobility: Needs Assistance Bed Mobility: Supine to Sit, Sit to Supine     Supine to sit: Supervision Sit to supine: Supervision   General bed mobility comments: supervision with cues to initiate and to position toward HOB at return to  supine as no recliner available    Transfers Overall transfer level: Needs assistance   Transfers: Sit to/from Stand Sit to Stand: Min guard           General transfer comment: cues for hand placement to and from bed and BSC    Ambulation/Gait Ambulation/Gait assistance: Min guard Gait Distance (Feet): 90 Feet Assistive device: Rolling walker (2 wheels) Gait Pattern/deviations: Step-through pattern, Decreased stride length, Trunk flexed   Gait velocity interpretation: <1.8 ft/sec, indicate of risk for recurrent falls   General Gait Details: cues to step into RW and extend trunk. Pt walked 13' then additional 12' after seated rest. Pt limited by fatigue and self-regulating distance   Stairs             Wheelchair Mobility    Modified Rankin (Stroke Patients Only)       Balance Overall balance assessment: Needs assistance Sitting-balance support: No upper extremity supported, Feet supported Sitting balance-Leahy Scale: Fair     Standing balance support: Bilateral upper extremity supported, Single extremity supported Standing balance-Leahy Scale: Poor Standing balance comment: Rw for gait and standing, able to reach with single UE to adjust brief in standing                            Cognition Arousal/Alertness: Awake/alert Behavior During Therapy: Flat affect Overall Cognitive Status: History of cognitive impairments - at baseline                                 General Comments: not oriented to time or situation, slow processing        Exercises General Exercises - Lower Extremity Heel Slides: AROM, Both, 10  reps, Supine Hip ABduction/ADduction: AROM, Both, 10 reps, Supine Straight Leg Raises: AROM, Both, 10 reps, Supine    General Comments        Pertinent Vitals/Pain Pain Assessment Pain Assessment: No/denies pain    Home Living                          Prior Function            PT Goals (current  goals can now be found in the care plan section) Progress towards PT goals: Progressing toward goals    Frequency    Min 2X/week      PT Plan Current plan remains appropriate    Co-evaluation              AM-PAC PT "6 Clicks" Mobility   Outcome Measure  Help needed turning from your back to your side while in a flat bed without using bedrails?: None Help needed moving from lying on your back to sitting on the side of a flat bed without using bedrails?: A Little Help needed moving to and from a bed to a chair (including a wheelchair)?: A Little Help needed standing up from a chair using your arms (e.g., wheelchair or bedside chair)?: A Little Help needed to walk in hospital room?: A Little Help needed climbing 3-5 steps with a railing? : A Lot 6 Click Score: 18    End of Session Equipment Utilized During Treatment: Gait belt;Oxygen Activity Tolerance: Patient tolerated treatment well Patient left: in bed;with call bell/phone within reach;with family/visitor present;with bed alarm set Nurse Communication: Mobility status PT Visit Diagnosis: Unsteadiness on feet (R26.81);Other abnormalities of gait and mobility (R26.89);Muscle weakness (generalized) (M62.81);Difficulty in walking, not elsewhere classified (R26.2)     Time: 6384-6659 PT Time Calculation (min) (ACUTE ONLY): 27 min  Charges:  $Gait Training: 8-22 mins $Therapeutic Activity: 8-22 mins                     Bayard Males, PT Acute Rehabilitation Services Office: [place] 05/19/2022, 10:25 AM

## 2022-05-19 NOTE — Progress Notes (Signed)
Patient seen and examined this morning, no acute overnight events, she is doing well and eating breakfast.  She has no complaints for me, no chest pain, no shortness of breath.  She is now past 3 midnights, per my discussion with TOC yesterday could go to SNF today.  Please refer to the discharge summary on 05/18/2022, updated this morning.  She is stable for discharge.  Dulcie Gammon M. Cruzita Lederer, MD, PhD [facility]  Between 7 am - 7 pm you can contact me via Amion (for emergencies) or [place] (non urgent matters).  I am not available 7 pm - 7 am, please contact night coverage MD/APP via Amion

## 2022-05-20 ENCOUNTER — Other Ambulatory Visit: Payer: Self-pay | Admitting: Family Medicine

## 2022-05-20 DIAGNOSIS — R41841 Cognitive communication deficit: Secondary | ICD-10-CM | POA: Diagnosis not present

## 2022-05-20 DIAGNOSIS — M6259 Muscle wasting and atrophy, not elsewhere classified, multiple sites: Secondary | ICD-10-CM | POA: Diagnosis not present

## 2022-05-20 DIAGNOSIS — J449 Chronic obstructive pulmonary disease, unspecified: Secondary | ICD-10-CM | POA: Diagnosis not present

## 2022-05-20 DIAGNOSIS — R2681 Unsteadiness on feet: Secondary | ICD-10-CM | POA: Diagnosis not present

## 2022-05-20 DIAGNOSIS — N390 Urinary tract infection, site not specified: Secondary | ICD-10-CM | POA: Diagnosis not present

## 2022-05-20 DIAGNOSIS — M6281 Muscle weakness (generalized): Secondary | ICD-10-CM | POA: Diagnosis not present

## 2022-05-20 DIAGNOSIS — J9611 Chronic respiratory failure with hypoxia: Secondary | ICD-10-CM | POA: Diagnosis not present

## 2022-05-20 DIAGNOSIS — F0390 Unspecified dementia without behavioral disturbance: Secondary | ICD-10-CM | POA: Diagnosis not present

## 2022-05-20 DIAGNOSIS — R630 Anorexia: Secondary | ICD-10-CM | POA: Diagnosis not present

## 2022-05-21 ENCOUNTER — Other Ambulatory Visit: Payer: Self-pay | Admitting: *Deleted

## 2022-05-21 NOTE — Telephone Encounter (Signed)
Requested medication (s) are due for refill today:   Provider to review  Requested medication (s) are on the active medication list:   Yes  Future visit scheduled:   No   Last ordered: This is from a recent hospital stay.   Prescribed by another provider.   It's also a non delegated drug.    Requested Prescriptions  Pending Prescriptions Disp Refills   HYDROcodone-acetaminophen (NORCO/VICODIN) 5-325 MG tablet [Pharmacy Med Name: hydrocodone 5 mg-acetaminophen 325 mg tablet] 90 tablet 0    Sig: TAKE ONE TABLET BY MOUTH eveyr SIX hours AS NEEDED FOR moderate pain     Not Delegated - Analgesics:  Opioid Agonist Combinations Failed - 05/20/2022  5:00 PM      Failed - This refill cannot be delegated      Failed - Urine Drug Screen completed in last 360 days      Failed - Valid encounter within last 3 months    Recent Outpatient Visits           1 year ago Community acquired pneumonia of left lower lobe of lung   [place] [name], MD   1 year ago Diabetes mellitus type 2 in nonobese [facility])   [place] [name], MD   2 years ago Panlobular emphysema [facility])   [place] [name], MD   2 years ago Shortness of breath   [place] [name], MD   2 years ago Chronic respiratory failure with hypoxia [facility])   [facility] [place], Cammie Mcgee, MD

## 2022-05-21 NOTE — Patient Outreach (Signed)
Mrs. Tweed resides in [place] skilled nursing facility. Screening for [facility] care coordination services as benefit of health plan and Primary Care Provider.   [place] social worker aware Probation officer is following for transition plan and potential [facility] care coordination needs.   Marthenia Rolling, MSN, RN,BSN [place] Acute Care Coordinator [phone] (Direct dial)

## 2022-05-23 DIAGNOSIS — R12 Heartburn: Secondary | ICD-10-CM | POA: Diagnosis not present

## 2022-05-23 DIAGNOSIS — R54 Age-related physical debility: Secondary | ICD-10-CM | POA: Diagnosis not present

## 2022-05-23 DIAGNOSIS — J449 Chronic obstructive pulmonary disease, unspecified: Secondary | ICD-10-CM | POA: Diagnosis not present

## 2022-05-23 DIAGNOSIS — E118 Type 2 diabetes mellitus with unspecified complications: Secondary | ICD-10-CM | POA: Diagnosis not present

## 2022-05-25 ENCOUNTER — Telehealth: Payer: Self-pay

## 2022-05-25 DIAGNOSIS — J449 Chronic obstructive pulmonary disease, unspecified: Secondary | ICD-10-CM | POA: Diagnosis not present

## 2022-05-25 DIAGNOSIS — R54 Age-related physical debility: Secondary | ICD-10-CM | POA: Diagnosis not present

## 2022-05-25 DIAGNOSIS — R12 Heartburn: Secondary | ICD-10-CM | POA: Diagnosis not present

## 2022-05-25 DIAGNOSIS — E118 Type 2 diabetes mellitus with unspecified complications: Secondary | ICD-10-CM | POA: Diagnosis not present

## 2022-05-25 NOTE — Telephone Encounter (Signed)
(  12:52 pm) Patient's daughter-in-law Marlana Salvage) called. Patient is currently at [facility] for rehab. An order was sent over for hospice, however patient is unable to get hospice while under Medicare days. She would like palliative care to continue to see patient at facility, because the plan is for patient remain at facility after Medicare days for LTC. The DIL is requesting follow-up ASAP and she would like the NP to contact  her with an update.

## 2022-05-27 DIAGNOSIS — J9611 Chronic respiratory failure with hypoxia: Secondary | ICD-10-CM | POA: Diagnosis not present

## 2022-05-27 DIAGNOSIS — R2681 Unsteadiness on feet: Secondary | ICD-10-CM | POA: Diagnosis not present

## 2022-05-27 DIAGNOSIS — M6281 Muscle weakness (generalized): Secondary | ICD-10-CM | POA: Diagnosis not present

## 2022-05-27 DIAGNOSIS — J449 Chronic obstructive pulmonary disease, unspecified: Secondary | ICD-10-CM | POA: Diagnosis not present

## 2022-05-27 DIAGNOSIS — N390 Urinary tract infection, site not specified: Secondary | ICD-10-CM | POA: Diagnosis not present

## 2022-06-01 DIAGNOSIS — R54 Age-related physical debility: Secondary | ICD-10-CM | POA: Diagnosis not present

## 2022-06-01 DIAGNOSIS — J9611 Chronic respiratory failure with hypoxia: Secondary | ICD-10-CM | POA: Diagnosis not present

## 2022-06-01 DIAGNOSIS — E118 Type 2 diabetes mellitus with unspecified complications: Secondary | ICD-10-CM | POA: Diagnosis not present

## 2022-06-01 DIAGNOSIS — R12 Heartburn: Secondary | ICD-10-CM | POA: Diagnosis not present

## 2022-06-03 DIAGNOSIS — J9611 Chronic respiratory failure with hypoxia: Secondary | ICD-10-CM | POA: Diagnosis not present

## 2022-06-03 DIAGNOSIS — R2681 Unsteadiness on feet: Secondary | ICD-10-CM | POA: Diagnosis not present

## 2022-06-03 DIAGNOSIS — J449 Chronic obstructive pulmonary disease, unspecified: Secondary | ICD-10-CM | POA: Diagnosis not present

## 2022-06-03 DIAGNOSIS — N390 Urinary tract infection, site not specified: Secondary | ICD-10-CM | POA: Diagnosis not present

## 2022-06-03 DIAGNOSIS — M6281 Muscle weakness (generalized): Secondary | ICD-10-CM | POA: Diagnosis not present

## 2022-06-04 ENCOUNTER — Non-Acute Institutional Stay: Payer: Medicare Other | Admitting: Hospice

## 2022-06-04 DIAGNOSIS — R531 Weakness: Secondary | ICD-10-CM

## 2022-06-04 DIAGNOSIS — F339 Major depressive disorder, recurrent, unspecified: Secondary | ICD-10-CM | POA: Diagnosis not present

## 2022-06-04 DIAGNOSIS — J449 Chronic obstructive pulmonary disease, unspecified: Secondary | ICD-10-CM

## 2022-06-04 DIAGNOSIS — F0390 Unspecified dementia without behavioral disturbance: Secondary | ICD-10-CM

## 2022-06-04 DIAGNOSIS — R54 Age-related physical debility: Secondary | ICD-10-CM | POA: Diagnosis not present

## 2022-06-04 DIAGNOSIS — Z515 Encounter for palliative care: Secondary | ICD-10-CM | POA: Diagnosis not present

## 2022-06-04 NOTE — Progress Notes (Signed)
[facility] Consult Note Telephone: [phone]  Fax: [phone]  PATIENT NAME: Briana Barr [place] [id number] [phone] (home)  DOB: 1940/07/09 MRN: [id number]  PRIMARY CARE PROVIDER:    [name], MD,  [address] [place] [address] [phone]  REFERRING PROVIDER:   [name], MD [address] [place],  [place] [address] [phone]  RESPONSIBLE PARTY: Self/Michael  Contact Information     Name Relation Home Work [place] Daughter   [phone]   Pell,Michael Pandora Leiter   [phone]        I met face to face with patient in the facility. Visit to build trust and highlight Palliative Medicine as specialized medical care for people living with serious illness, aimed at facilitating better quality of life through symptoms relief, assisting with advance care planning and complex medical decision making. Daughter in law -  [name] is with patient during visit.   Palliative care team will continue to support patient, patient's family, and medical team. ASSESSMENT AND / RECOMMENDATIONS:   Advance Care Planning: Our advance care planning conversation included a discussion about:    The value and importance of advance care planning  Difference between Hospice and Palliative care Exploration of goals of care in the event of a sudden injury or illness  Identification and preparation of a healthcare agent  Review and updating or creation of an  advance directive document . Decision not to resuscitate or to de-escalate disease focused treatments due to poor prognosis.  CODE STATUS:Patient is a Do Not Resuscitate.   Goals of Care: Goals include to maximize quality of life and symptom management Family is interested in hospice when patient qualifies for it.  MOST form selection indicates comfort measures, no IV fluid, no antibiotics, no tube feeding.  [name]  reports plan is to discharge patient home with hospice if possible. I spent 20 minutes providing this initial consultation. More than 50% of the time in this consultation was spent on counseling patient and coordinating communication. --------------------------------------------------------------------------------------------------------------------------------------  Symptom Management/Plan: COPD: Worsening, oxygen dependent at 6 L/min at baseline.  Continue Elleptra and Albuterol as ordered. Avoid triggers. Encourage slow deep breathing. Follow up with Pulmonologist as ordered.  Dementia: Worsening memory loss/confusion, impoverished thought, FAST 6D, incontinent of bowel and bladder.  Provide redirection and cueing as needed.  Encourage reminiscence, appropriate level word search/puzzles Weakness: Worsening weakness and shortness of breath secondary to worsening COPD.  Provide adequate time and rest.  As for activities.  PT/OT for strengthening and gait training.  Fall precautions. Depression: Continue Escitalopram. Encourage socialization and participation in facility activities Follow up: Palliative care will continue to follow for complex medical decision making, advance care planning, and clarification of goals. Return 6 weeks or prn. Encouraged to call provider sooner with any concerns.   Family /Caregiver/Community Supports:   HOSPICE ELIGIBILITY/DIAGNOSIS: TBD  Chief Complaint: Initial Palliative care visit  HISTORY OF PRESENT ILLNESS:  Briana Barr is a 82 y.o. year old female  with multiple morbidities requiring close monitoring and with high risk of complications and  mortality: COPD,  on 6L at baseline, DM2, HTNDementia.  Patient endorses shortness of breath on exertion, denies pain/discomfort. History obtained from review of EMR, discussion with primary team, caregiver, family and/or Ms. Delvecchio.  Review and summarization of Epic records shows history from other than patient. Rest  of 10 point ROS asked and negative. Independent interpretation  of tests and reviewed as needed, available labs, patient records, imaging, studies and related documents from the EMR.   PAST MEDICAL HISTORY:  Active Ambulatory Problems    Diagnosis Date Noted   Abnormal CT scan, colon    Benign neoplasm of sigmoid colon    Benign neoplasm of rectum    Loss of weight    Epigastric abdominal tenderness without rebound tenderness    Anorexia    Acute respiratory failure with hypoxia (HCC) 11/13/2019   COPD (chronic obstructive pulmonary disease) ([place]) 11/13/2019   Diabetes mellitus type 2 in nonobese ([place]) 11/13/2019   Essential hypertension 11/13/2019   Community acquired pneumonia of left lower lobe of lung    Bacteremia 11/14/2019   CAP (community acquired pneumonia) 03/14/2020   Dementia ([place]) 05/16/2022   Acute encephalopathy [id number]   Acute metabolic encephalopathy 123XX123   Resolved Ambulatory Problems    Diagnosis Date Noted   CAP (community acquired pneumonia) 11/14/2019   Past Medical History:  Diagnosis Date   Barrett esophagus    Colon polyps    Duodenal ulcer    DVT (deep venous thrombosis) (HCC)    Gallstones    GERD (gastroesophageal reflux disease)    Glaucoma    Hyperlipidemia    Ischemic colitis (HCC)    Osteoporosis    Pneumonia    Renal cyst, right    Retinopathy, background, proliferative     SOCIAL HX:  Social History   Tobacco Use   Smoking status: Every Day    Packs/day: 1.00    Types: Cigarettes   Smokeless tobacco: Never   Tobacco comments:    started age 12  Substance Use Topics   Alcohol use: No     FAMILY HX:  Family History  Problem Relation Age of Onset   Other Father        some sort of blood cancer-had to get transfusions   Stomach cancer Maternal Grandmother    Stomach cancer Maternal Grandfather       ALLERGIES:  Allergies  Allergen Reactions   Vioxx [Rofecoxib] Swelling      PERTINENT MEDICATIONS:   Outpatient Encounter Medications as of 06/04/2022  Medication Sig   acetaZOLAMIDE (DIAMOX) 250 MG tablet Take 1 tablet (250 mg total) by mouth every morning.   albuterol (PROAIR HFA) 108 (90 Base) MCG/ACT inhaler Inhale 2 puffs into the lungs every 6 (six) hours as needed for wheezing or shortness of breath.   albuterol (PROVENTIL) (2.5 MG/3ML) 0.083% nebulizer solution INHALE THE CONTENTS OF 1 VIAL VIA NEBULIZATION EVERY 6 HOURS AS NEEDED FOR [facility] AND SHORTNESS OF BREATH (Patient taking differently: Take 2.5 mg by nebulization every 6 (six) hours as needed for wheezing or shortness of breath.)   aspirin 81 MG tablet Take 81 mg by mouth daily.   Cholecalciferol (VITAMIN D3) 250 MCG (10000 UT) capsule Take 1 capsule (10,000 Units total) by mouth daily.   ELDERBERRY PO Take 1 each by mouth daily.   escitalopram (LEXAPRO) 10 MG tablet Take 1 tablet (10 mg total) by mouth daily.   Fluticasone-Umeclidin-Vilant (TRELEGY ELLIPTA) 100-62.5-25 MCG/ACT AEPB Inhale 1 Dose into the lungs daily. (Patient taking differently: Inhale 1 Dose into the lungs every evening.)   glipiZIDE (GLUCOTROL XL) 5 MG 24 hr tablet Take 1 tablet (5 mg total) by mouth every morning.   HYDROcodone-acetaminophen (NORCO/VICODIN) 5-325 MG tablet TAKE ONE TABLET BY MOUTH eveyr SIX hours AS NEEDED FOR moderate pain   losartan (COZAAR) 50 MG tablet Take 1 tablet (  50 mg total) by mouth daily.   metFORMIN (GLUCOPHAGE) 1000 MG tablet TAKE ONE TABLET BY MOUTH TWICE DAILY WITH FOOD (Patient taking differently: Take 1,000 mg by mouth 2 (two) times daily with a meal.)   Multiple Vitamins-Minerals (CENTRUM SILVER 50+WOMEN) TABS Take 1 tablet by mouth daily.   omeprazole (PRILOSEC) 20 MG capsule TAKE 1 CAPSULE BY MOUTH TWICE A DAY BEFORE A MEAL (Patient taking differently: Take 20 mg by mouth 2 (two) times daily before a meal.)   rosuvastatin (CRESTOR) 20 MG tablet Take 1 tablet (20 mg total) by mouth every evening.   sitaGLIPtin (JANUVIA) 100  MG tablet Take 1 tablet (100 mg total) by mouth daily.   No facility-administered encounter medications on file as of 06/04/2022.   Thank you for the opportunity to participate in the care of Ms. Reger.  The palliative care team will continue to follow. Please call our office at [phone] if we can be of additional assistance.   Note: Portions of this note were generated with [employer]. Dictation errors may occur despite best attempts at proofreading.  Teodoro Spray, NP

## 2022-06-08 DIAGNOSIS — R54 Age-related physical debility: Secondary | ICD-10-CM | POA: Diagnosis not present

## 2022-06-08 DIAGNOSIS — R12 Heartburn: Secondary | ICD-10-CM | POA: Diagnosis not present

## 2022-06-08 DIAGNOSIS — E118 Type 2 diabetes mellitus with unspecified complications: Secondary | ICD-10-CM | POA: Diagnosis not present

## 2022-06-09 ENCOUNTER — Other Ambulatory Visit: Payer: Self-pay

## 2022-06-09 ENCOUNTER — Encounter (HOSPITAL_COMMUNITY): Payer: Self-pay

## 2022-06-09 ENCOUNTER — Emergency Department (HOSPITAL_COMMUNITY): Payer: Medicare Other

## 2022-06-09 ENCOUNTER — Inpatient Hospital Stay (HOSPITAL_COMMUNITY)
Admission: EM | Admit: 2022-06-09 | Discharge: 2022-06-10 | DRG: 191 | Disposition: A | Discharge status: Hospice | Payer: Medicare Other | Attending: Internal Medicine | Admitting: Internal Medicine

## 2022-06-09 DIAGNOSIS — E785 Hyperlipidemia, unspecified: Secondary | ICD-10-CM | POA: Diagnosis present

## 2022-06-09 DIAGNOSIS — E782 Mixed hyperlipidemia: Secondary | ICD-10-CM | POA: Diagnosis not present

## 2022-06-09 DIAGNOSIS — R0602 Shortness of breath: Secondary | ICD-10-CM | POA: Diagnosis present

## 2022-06-09 DIAGNOSIS — K219 Gastro-esophageal reflux disease without esophagitis: Secondary | ICD-10-CM | POA: Diagnosis present

## 2022-06-09 DIAGNOSIS — R062 Wheezing: Secondary | ICD-10-CM | POA: Diagnosis not present

## 2022-06-09 DIAGNOSIS — Z7984 Long term (current) use of oral hypoglycemic drugs: Secondary | ICD-10-CM | POA: Diagnosis not present

## 2022-06-09 DIAGNOSIS — F0393 Unspecified dementia, unspecified severity, with mood disturbance: Secondary | ICD-10-CM | POA: Diagnosis present

## 2022-06-09 DIAGNOSIS — Z515 Encounter for palliative care: Secondary | ICD-10-CM | POA: Diagnosis not present

## 2022-06-09 DIAGNOSIS — T380X5A Adverse effect of glucocorticoids and synthetic analogues, initial encounter: Secondary | ICD-10-CM | POA: Diagnosis not present

## 2022-06-09 DIAGNOSIS — Z79899 Other long term (current) drug therapy: Secondary | ICD-10-CM

## 2022-06-09 DIAGNOSIS — Z981 Arthrodesis status: Secondary | ICD-10-CM

## 2022-06-09 DIAGNOSIS — E119 Type 2 diabetes mellitus without complications: Secondary | ICD-10-CM | POA: Diagnosis not present

## 2022-06-09 DIAGNOSIS — I10 Essential (primary) hypertension: Secondary | ICD-10-CM | POA: Diagnosis not present

## 2022-06-09 DIAGNOSIS — F32A Depression, unspecified: Secondary | ICD-10-CM | POA: Diagnosis not present

## 2022-06-09 DIAGNOSIS — Z1152 Encounter for screening for COVID-19: Secondary | ICD-10-CM | POA: Diagnosis not present

## 2022-06-09 DIAGNOSIS — Z8701 Personal history of pneumonia (recurrent): Secondary | ICD-10-CM

## 2022-06-09 DIAGNOSIS — I5032 Chronic diastolic (congestive) heart failure: Secondary | ICD-10-CM | POA: Diagnosis present

## 2022-06-09 DIAGNOSIS — K2270 Barrett's esophagus without dysplasia: Secondary | ICD-10-CM | POA: Diagnosis present

## 2022-06-09 DIAGNOSIS — Z800 Family history of malignant neoplasm of digestive organs: Secondary | ICD-10-CM

## 2022-06-09 DIAGNOSIS — E11319 Type 2 diabetes mellitus with unspecified diabetic retinopathy without macular edema: Secondary | ICD-10-CM | POA: Diagnosis present

## 2022-06-09 DIAGNOSIS — Z7951 Long term (current) use of inhaled steroids: Secondary | ICD-10-CM

## 2022-06-09 DIAGNOSIS — I110 Hypertensive heart disease with heart failure: Secondary | ICD-10-CM | POA: Diagnosis present

## 2022-06-09 DIAGNOSIS — Z9981 Dependence on supplemental oxygen: Secondary | ICD-10-CM

## 2022-06-09 DIAGNOSIS — Z7401 Bed confinement status: Secondary | ICD-10-CM | POA: Diagnosis not present

## 2022-06-09 DIAGNOSIS — R059 Cough, unspecified: Secondary | ICD-10-CM | POA: Diagnosis not present

## 2022-06-09 DIAGNOSIS — M810 Age-related osteoporosis without current pathological fracture: Secondary | ICD-10-CM | POA: Diagnosis present

## 2022-06-09 DIAGNOSIS — J9611 Chronic respiratory failure with hypoxia: Secondary | ICD-10-CM | POA: Diagnosis present

## 2022-06-09 DIAGNOSIS — Z7982 Long term (current) use of aspirin: Secondary | ICD-10-CM

## 2022-06-09 DIAGNOSIS — R000 Tachycardia, unspecified: Secondary | ICD-10-CM | POA: Diagnosis not present

## 2022-06-09 DIAGNOSIS — Z888 Allergy status to other drugs, medicaments and biological substances status: Secondary | ICD-10-CM

## 2022-06-09 DIAGNOSIS — E1165 Type 2 diabetes mellitus with hyperglycemia: Secondary | ICD-10-CM | POA: Diagnosis not present

## 2022-06-09 DIAGNOSIS — F17210 Nicotine dependence, cigarettes, uncomplicated: Secondary | ICD-10-CM | POA: Diagnosis present

## 2022-06-09 DIAGNOSIS — J9811 Atelectasis: Secondary | ICD-10-CM | POA: Diagnosis present

## 2022-06-09 DIAGNOSIS — Z8711 Personal history of peptic ulcer disease: Secondary | ICD-10-CM

## 2022-06-09 DIAGNOSIS — Z66 Do not resuscitate: Secondary | ICD-10-CM | POA: Diagnosis present

## 2022-06-09 DIAGNOSIS — Z9049 Acquired absence of other specified parts of digestive tract: Secondary | ICD-10-CM

## 2022-06-09 DIAGNOSIS — J441 Chronic obstructive pulmonary disease with (acute) exacerbation: Secondary | ICD-10-CM | POA: Diagnosis not present

## 2022-06-09 DIAGNOSIS — Z86010 Personal history of colonic polyps: Secondary | ICD-10-CM

## 2022-06-09 DIAGNOSIS — R531 Weakness: Secondary | ICD-10-CM | POA: Diagnosis not present

## 2022-06-09 LAB — I-STAT VENOUS BLOOD GAS, ED
Acid-base deficit: 1 mmol/L (ref 0.0–2.0)
Bicarbonate: 26.6 mmol/L (ref 20.0–28.0)
Calcium, Ion: 1.25 mmol/L (ref 1.15–1.40)
HCT: 42 % (ref 36.0–46.0)
Hemoglobin: 14.3 g/dL (ref 12.0–15.0)
O2 Saturation: 99 %
Potassium: 3.8 mmol/L (ref 3.5–5.1)
Sodium: 141 mmol/L (ref 135–145)
TCO2: 28 mmol/L (ref 22–32)
pCO2, Ven: 56 mmHg (ref 44–60)
pH, Ven: 7.285 (ref 7.25–7.43)
pO2, Ven: 144 mmHg — ABNORMAL HIGH (ref 32–45)

## 2022-06-09 LAB — COMPREHENSIVE METABOLIC PANEL
ALT: 48 U/L — ABNORMAL HIGH (ref 0–44)
AST: 39 U/L (ref 15–41)
Albumin: 3.5 g/dL (ref 3.5–5.0)
Alkaline Phosphatase: 67 U/L (ref 38–126)
Anion gap: 13 (ref 5–15)
BUN: 16 mg/dL (ref 8–23)
CO2: 25 mmol/L (ref 22–32)
Calcium: 9.6 mg/dL (ref 8.9–10.3)
Chloride: 102 mmol/L (ref 98–111)
Creatinine, Ser: 0.84 mg/dL (ref 0.44–1.00)
GFR, Estimated: >60 mL/min (ref >60)
Glucose, Bld: 246 mg/dL — ABNORMAL HIGH (ref 70–99)
Potassium: 3.7 mmol/L (ref 3.5–5.1)
Sodium: 140 mmol/L (ref 135–145)
Total Bilirubin: 0.6 mg/dL (ref 0.3–1.2)
Total Protein: 6.9 g/dL (ref 6.5–8.1)

## 2022-06-09 LAB — RESP PANEL BY RT-PCR (RSV, FLU A&B, COVID)  RVPGX2
Influenza A by PCR: NEGATIVE
Influenza B by PCR: NEGATIVE
Resp Syncytial Virus by PCR: NEGATIVE
SARS Coronavirus 2 by RT PCR: NEGATIVE

## 2022-06-09 LAB — BRAIN NATRIURETIC PEPTIDE: B Natriuretic Peptide: 30 pg/mL (ref 0.0–100.0)

## 2022-06-09 LAB — CBC WITH DIFFERENTIAL/PLATELET
Abs Immature Granulocytes: 0.1 10*3/uL — ABNORMAL HIGH (ref 0.00–0.07)
Basophils Absolute: 0.1 10*3/uL (ref 0.0–0.1)
Basophils Relative: 1 %
Eosinophils Absolute: 0.2 10*3/uL (ref 0.0–0.5)
Eosinophils Relative: 2 %
HCT: 44.9 % (ref 36.0–46.0)
Hemoglobin: 13.1 g/dL (ref 12.0–15.0)
Immature Granulocytes: 1 %
Lymphocytes Relative: 20 %
Lymphs Abs: 1.9 10*3/uL (ref 0.7–4.0)
MCH: 28 pg (ref 26.0–34.0)
MCHC: 29.2 g/dL — ABNORMAL LOW (ref 30.0–36.0)
MCV: 95.9 fL (ref 80.0–100.0)
Monocytes Absolute: 1.3 10*3/uL — ABNORMAL HIGH (ref 0.1–1.0)
Monocytes Relative: 14 %
Neutro Abs: 5.9 10*3/uL (ref 1.7–7.7)
Neutrophils Relative %: 62 %
Platelets: 406 10*3/uL — ABNORMAL HIGH (ref 150–400)
RBC: 4.68 MIL/uL (ref 3.87–5.11)
RDW: 14.9 % (ref 11.5–15.5)
WBC: 9.5 10*3/uL (ref 4.0–10.5)
nRBC: 0 % (ref 0.0–0.2)

## 2022-06-09 LAB — MAGNESIUM: Magnesium: 1.7 mg/dL (ref 1.7–2.4)

## 2022-06-09 LAB — PROCALCITONIN: Procalcitonin: <0.1 ng/mL

## 2022-06-09 MED ORDER — ROSUVASTATIN CALCIUM 20 MG PO TABS
20.0000 mg | ORAL_TABLET | Freq: Every evening | ORAL | Status: DC
  Administered 2022-06-10: 20 mg via ORAL
  Filled 2022-06-09: qty 1

## 2022-06-09 MED ORDER — NICOTINE 21 MG/24HR TD PT24: 21.0000 mg | MEDICATED_PATCH | Freq: Every day | TRANSDERMAL | Status: DC | PRN

## 2022-06-09 MED ORDER — IPRATROPIUM BROMIDE 0.02 % IN SOLN
0.5000 mg | Freq: Four times a day (QID) | RESPIRATORY_TRACT | Status: DC
  Administered 2022-06-10 (×3): 0.5 mg via RESPIRATORY_TRACT
  Filled 2022-06-09 (×3): qty 2.5

## 2022-06-09 MED ORDER — MAGNESIUM SULFATE 2 GM/50ML IV SOLN
2.0000 g | Freq: Once | INTRAVENOUS | Status: AC
  Administered 2022-06-09: 2 g via INTRAVENOUS
  Filled 2022-06-09: qty 50

## 2022-06-09 MED ORDER — ASPIRIN 81 MG PO TBEC
81.0000 mg | DELAYED_RELEASE_TABLET | Freq: Every day | ORAL | Status: DC
  Administered 2022-06-10: 81 mg via ORAL
  Filled 2022-06-09: qty 1

## 2022-06-09 MED ORDER — LEVALBUTEROL HCL 1.25 MG/0.5ML IN NEBU: 1.2500 mg | INHALATION_SOLUTION | RESPIRATORY_TRACT | Status: DC | PRN

## 2022-06-09 MED ORDER — IPRATROPIUM BROMIDE 0.02 % IN SOLN
1.0000 mg | Freq: Once | RESPIRATORY_TRACT | Status: AC
  Administered 2022-06-09: 1 mg via RESPIRATORY_TRACT
  Filled 2022-06-09: qty 5

## 2022-06-09 MED ORDER — ACETAMINOPHEN 650 MG RE SUPP: 650.0000 mg | Freq: Four times a day (QID) | RECTAL | Status: DC | PRN

## 2022-06-09 MED ORDER — ALBUTEROL SULFATE (2.5 MG/3ML) 0.083% IN NEBU
15.0000 mg/h | INHALATION_SOLUTION | Freq: Once | RESPIRATORY_TRACT | Status: AC
  Administered 2022-06-09: 15 mg/h via RESPIRATORY_TRACT
  Filled 2022-06-09: qty 18

## 2022-06-09 MED ORDER — METHYLPREDNISOLONE SODIUM SUCC 125 MG IJ SOLR
125.0000 mg | Freq: Once | INTRAMUSCULAR | Status: AC
  Administered 2022-06-09: 125 mg via INTRAVENOUS
  Filled 2022-06-09: qty 2

## 2022-06-09 MED ORDER — INSULIN ASPART 100 UNIT/ML IJ SOLN
0.0000 [IU] | Freq: Three times a day (TID) | INTRAMUSCULAR | Status: DC
  Administered 2022-06-10: 9 [IU] via SUBCUTANEOUS
  Administered 2022-06-10: 7 [IU] via SUBCUTANEOUS

## 2022-06-09 MED ORDER — HYDROCODONE-ACETAMINOPHEN 5-325 MG PO TABS
1.0000 | ORAL_TABLET | ORAL | Status: DC | PRN
  Administered 2022-06-10: 1 via ORAL
  Filled 2022-06-09: qty 1

## 2022-06-09 MED ORDER — LEVALBUTEROL HCL 1.25 MG/0.5ML IN NEBU
1.2500 mg | INHALATION_SOLUTION | Freq: Four times a day (QID) | RESPIRATORY_TRACT | Status: DC
  Administered 2022-06-10 (×3): 1.25 mg via RESPIRATORY_TRACT
  Filled 2022-06-09 (×6): qty 0.5

## 2022-06-09 MED ORDER — ESCITALOPRAM OXALATE 10 MG PO TABS
10.0000 mg | ORAL_TABLET | Freq: Every day | ORAL | Status: DC
  Administered 2022-06-10: 10 mg via ORAL
  Filled 2022-06-09: qty 1

## 2022-06-09 MED ORDER — ACETAMINOPHEN 325 MG PO TABS: 650.0000 mg | ORAL_TABLET | Freq: Four times a day (QID) | ORAL | Status: DC | PRN

## 2022-06-09 MED ORDER — ACETAMINOPHEN 325 MG PO TABS
650.0000 mg | ORAL_TABLET | Freq: Once | ORAL | Status: AC
  Administered 2022-06-09: 650 mg via ORAL
  Filled 2022-06-09: qty 2

## 2022-06-09 MED ORDER — PANTOPRAZOLE SODIUM 40 MG PO TBEC
40.0000 mg | DELAYED_RELEASE_TABLET | Freq: Every day | ORAL | Status: DC
  Administered 2022-06-10: 40 mg via ORAL
  Filled 2022-06-09: qty 1

## 2022-06-09 MED ORDER — METHYLPREDNISOLONE SODIUM SUCC 125 MG IJ SOLR
80.0000 mg | Freq: Two times a day (BID) | INTRAMUSCULAR | Status: DC
  Administered 2022-06-10: 80 mg via INTRAVENOUS
  Filled 2022-06-09: qty 2

## 2022-06-09 MED ORDER — SODIUM CHLORIDE 0.9 % IV BOLUS
1000.0000 mL | Freq: Once | INTRAVENOUS | Status: AC
  Administered 2022-06-09: 1000 mL via INTRAVENOUS

## 2022-06-09 MED ORDER — SODIUM CHLORIDE 0.9 % IV SOLN
500.0000 mg | INTRAVENOUS | Status: DC
  Administered 2022-06-09: 500 mg via INTRAVENOUS
  Filled 2022-06-09 (×2): qty 5

## 2022-06-09 MED ORDER — MELATONIN 3 MG PO TABS: 3.0000 mg | ORAL_TABLET | Freq: Every evening | ORAL | Status: DC | PRN

## 2022-06-09 NOTE — ED Provider Notes (Signed)
[facility] Provider Note   CSN: [id number] Arrival date & time:        History  Chief Complaint  Patient presents with   Shortness of Breath    Briana Barr is a 82 y.o. female history of end-stage COPD on 6 L at baseline, dementia, diabetes here presenting with worsening shortness of breath.  Patient was just admitted to the hospital for UTI.  She is at [facility] rehab facility right now.  Patient just saw palliative care this past week for goals of care discussion.  Patient was in the process of getting home hospice.  Patient was noted to be tachypneic and in moderate respiratory distress for the son.  EMS was called and she was noted to be wheezing and patient was given albuterol prior to arrival.  The history is provided by the patient.       Home Medications Prior to Admission medications   Medication Sig Start Date End Date Taking? Authorizing Provider  acetaZOLAMIDE (DIAMOX) 250 MG tablet Take 1 tablet (250 mg total) by mouth every morning. 03/19/22   [name], MD  albuterol (PROAIR HFA) 108 (90 Base) MCG/ACT inhaler Inhale 2 puffs into the lungs every 6 (six) hours as needed for wheezing or shortness of breath. 03/06/19   Alycia Rossetti, MD  albuterol (PROVENTIL) (2.5 MG/3ML) 0.083% nebulizer solution INHALE THE CONTENTS OF 1 VIAL VIA NEBULIZATION EVERY 6 HOURS AS NEEDED FOR [facility] AND SHORTNESS OF BREATH Patient taking differently: Take 2.5 mg by nebulization every 6 (six) hours as needed for wheezing or shortness of breath. 02/23/22   [name], MD  aspirin 81 MG tablet Take 81 mg by mouth daily.    [provider]  Cholecalciferol (VITAMIN D3) 250 MCG (10000 UT) capsule Take 1 capsule (10,000 Units total) by mouth daily. 01/20/19   [name], MD  ELDERBERRY PO Take 1 each by mouth daily.    [provider]  escitalopram (LEXAPRO) 10 MG tablet Take 1 tablet (10 mg total) by mouth  daily. 04/20/22   [name], MD  Fluticasone-Umeclidin-Vilant (TRELEGY ELLIPTA) 100-62.5-25 MCG/ACT AEPB Inhale 1 Dose into the lungs daily. Patient taking differently: Inhale 1 Dose into the lungs every evening. 03/19/22   [name], MD  glipiZIDE (GLUCOTROL XL) 5 MG 24 hr tablet Take 1 tablet (5 mg total) by mouth every morning. 02/17/22   [name], MD  HYDROcodone-acetaminophen (NORCO/VICODIN) 5-325 MG tablet TAKE ONE TABLET BY MOUTH eveyr SIX hours AS NEEDED FOR moderate pain 05/21/22   [name], MD  losartan (COZAAR) 50 MG tablet Take 1 tablet (50 mg total) by mouth daily. 02/17/22   [name], MD  metFORMIN (GLUCOPHAGE) 1000 MG tablet TAKE ONE TABLET BY MOUTH TWICE DAILY WITH FOOD Patient taking differently: Take 1,000 mg by mouth 2 (two) times daily with a meal. 08/27/21   [name], MD  Multiple Vitamins-Minerals (CENTRUM SILVER 50+WOMEN) TABS Take 1 tablet by mouth daily. 01/20/19   [name], MD  omeprazole (PRILOSEC) 20 MG capsule TAKE 1 CAPSULE BY MOUTH TWICE A DAY BEFORE A MEAL Patient taking differently: Take 20 mg by mouth 2 (two) times daily before a meal. 02/17/22   [name], MD  rosuvastatin (CRESTOR) 20 MG tablet Take 1 tablet (20 mg total) by mouth every evening. 03/19/22   [name], MD  sitaGLIPtin (JANUVIA) 100 MG tablet Take 1 tablet (100  mg total) by mouth daily. 03/19/22   [name], MD      Allergies    Vioxx [rofecoxib]    Review of Systems   Review of Systems  Respiratory:  Positive for shortness of breath.   All other systems reviewed and are negative.   Physical Exam Updated Vital Signs BP 132/78   Pulse (!) 114   Temp 100.3 F (37.9 C) (Oral)   Resp (!) 31   Ht '5\' 6"'$  (1.676 m)   Wt 73.2 kg   SpO2 100%   BMI 26.05 kg/m  Physical Exam Vitals and nursing note reviewed.  Constitutional:      Comments: Tachypneic and confused  HENT:     Head: Normocephalic.     Mouth/Throat:      Mouth: Mucous membranes are moist.  Eyes:     Extraocular Movements: Extraocular movements intact.     Pupils: Pupils are equal, round, and reactive to light.  Cardiovascular:     Rate and Rhythm: Regular rhythm. Tachycardia present.  Pulmonary:     Comments: Tachypneic and mild retractions and diffuse wheezing Abdominal:     General: Bowel sounds are normal.     Palpations: Abdomen is soft.  Musculoskeletal:        General: Normal range of motion.     Cervical back: Normal range of motion and neck supple.  Skin:    General: Skin is warm.     Capillary Refill: Capillary refill takes less than 2 seconds.  Neurological:     General: No focal deficit present.     Mental Status: She is oriented to person, place, and time.  Psychiatric:        Mood and Affect: Mood normal.        Behavior: Behavior normal.     ED Results / Procedures / Treatments   Labs (all labs ordered are listed, but only abnormal results are displayed) Labs Reviewed  RESP PANEL BY RT-PCR (RSV, FLU A&B, COVID)  [id number]  CBC WITH DIFFERENTIAL/PLATELET  COMPREHENSIVE METABOLIC PANEL  BLOOD GAS, VENOUS    EKG EKG Interpretation  Date/Time:  Tuesday June 09 2022 19:41:20 EST Ventricular Rate:  112 PR Interval:  174 QRS Duration: 84 QT Interval:  336 QTC Calculation: 459 R Axis:   82 Text Interpretation: Sinus tachycardia Borderline right axis deviation Low voltage, precordial leads Since last tracing rate faster Confirmed by Wandra Arthurs [phone]) on 06/09/2022 7:47:04 PM  Radiology No results found.  Procedures Procedures    Medications Ordered in ED Medications  methylPREDNISolone sodium succinate (SOLU-MEDROL) 125 mg/2 mL injection 125 mg (has no administration in time range)  magnesium sulfate IVPB 2 g 50 mL (has no administration in time range)  albuterol (PROVENTIL,VENTOLIN) solution continuous neb (has no administration in time range)  ipratropium (ATROVENT) nebulizer solution 1 mg (has  no administration in time range)    ED Course/ Medical Decision Making/ A&P                             Medical Decision Making Briana Barr is a 82 y.o. female here presenting with shortness of breath.  Patient has a history of end-stage COPD on 6 L at baseline.  Patient had seen palliative care recently and had goals of care discussion but did not get home hospice set up yet.  Patient has worsening shortness of breath and has a DNR.  Will get CBC and CMP  and COVID and flu and RSV test and chest x-ray.  Will have more detailed goals of care discussion when son arrives.  Will give steroids and magnesium and albuterol for now.  8:47 PM Patient is still wheezing.  Patient's VBG showed pH of 7.28.  Chest x-ray showed no pneumonia.  White blood cell count is normal.  Son is at bedside.  He states that she only wants IV antibiotics and steroids and breathing treatments.  She is DNR.  He wants to set her up for home hospice but states that she has not gotten a set up yet.  At this point, since she is still wheezing and moderate distress, will admit her and will have palliative care see her in the morning.  Problems Addressed: COPD exacerbation ([place]): acute illness or injury  Amount and/or Complexity of Data Reviewed Labs: ordered. Decision-making details documented in ED Course. Radiology: ordered and independent interpretation performed. Decision-making details documented in ED Course. ECG/medicine tests: ordered.  Risk OTC drugs. Prescription drug management.  Final Clinical Impression(s) / ED Diagnoses Final diagnoses:  None    Rx / DC Orders ED Discharge Orders     None         Drenda Freeze, MD 06/09/22 2048

## 2022-06-09 NOTE — ED Triage Notes (Addendum)
PT HAS HAD SOB ALL DAY WITH WHEEZING - SON HAS TRIED TO GET NEBS ALL DAY BUT HAS NOT HAD - PT BIB EMS FROM [facility]. PT ON 6L @ HOME.  PT HAD 3 NEB WITH EMS

## 2022-06-09 NOTE — H&P (Signed)
History and Physical      Briana Barr [id number] DOB: 1941/04/08 DOA: 06/09/2022  PCP: [name], MD  Patient coming from: home   I have personally briefly reviewed patient's old medical records in [place]  Chief Complaint: Shortness of breath  HPI: Briana Barr is a 82 y.o. female with medical history significant for stage IV COPD, chronic epoxy respiratory failure on continuous baseline 6 L nasal cannula, chronic diastolic heart failure, type 2 diabetes mellitus, essential hypertension, hyperlipidemia, who is admitted to [facility] on 06/09/2022 with acute COPD exacerbation after presenting from home to [facility] ED complaining of shortness of breath.   In the setting of the patient's dementia, the following history provided by the patient as well as her son, who is present at bedside, in addition to my discussions with the EDP and via chart review.  Patient has been experiencing to 3 days of progressive shortness of breath, associated with worsening nonproductive cough.  No associate with any orthopnea, PND, or worsening of peripheral edema.  Denies any associated strict fever, chills, rigors, generalized myalgias.  No associate with any chest pain, palpitations, diaphoresis, nausea, vomiting, dizziness, presyncope, or syncope.  No recent trauma.  No recent calf tenderness or new lower extremity erythema.  She does however note worsening wheezing over the last 2 to 3 days, in the absence of any hemoptysis.  She has a documented history of stage IV COPD, consistent with chronic hypoxic respiratory failure on her baseline continue 6 L nasal cannula.  Per my discussions with the patient's son Briana Barr), who is the patient's power of attorney, he is interested in making arrangements for home hospice.  He has attempted to initiate this process with palliative care, although formal arrangements for initiation of home hospice are not in place at this moment.  While the patient  is not yet full comfort care measures, send conveys that the patient would not want any aggressive interventions, including no pressors, noninvasive mechanical ventilation.  He confirms that she is DNR/DNI.  She would however, be amenable to steroids, breathing treatments, antibiotics.    ED Course:  Vital signs in the ED were notable for the following: Temperature max 100.3; heart rate [id number], systolic pressures in the 130s 140s; respiratory rate 19-31, oxygen saturation 98 to 100% on her baseline 6 L continuous nasal cannula.  Labs were notable for the following: CMP notable for the following: Sodium 140, bicarbonate 25, anion gap 13, creatinine 0.84, glucose 246, liver enzymes found to be within normal limits.  Magnesium level 1.7.  CBC notable for white cell count 8560 presented.  Hemoglobin 13.1.  VBG ordered, others are currently pending.  COVID, influenza, RSV PCR all negative.  Per my interpretation, EKG in ED demonstrated the following: Sinus rhythm with heart rate 112, normal intervals, no evidence of T wave or ST changes, clear evidence of ST elevation.  Imaging and additional notable ED work-up: 1 view chest x-ray, full radiology read, shows mild bibasilar atelectasis, but otherwise no evidence of acute cardiopulmonary process, including no evidence of ventricular edema effusion, or pneumothorax.  While in the ED, the following were administered: Acetaminophen 600 mg p.o. x 1, due to his treatment x 1, solumedrol 125 mg IV x 1, normal saline x 1 L bolus, magnesium sulfate 2 g IV over 2 hours x 1 dose.  Subsequently, the patient was admitted for further evaluation management of presenting acute COPD exacerbation, including providing assistance with patient care consultation to facilitate  POA's desire for home hospice.      Review of Systems: As per HPI otherwise 10 point review of systems negative.   Past Medical History:  Diagnosis Date   Barrett esophagus    Colon polyps     benign   COPD (chronic obstructive pulmonary disease) (HCC)    Duodenal ulcer    DVT (deep venous thrombosis) (HCC)    left leg x 2   Gallstones    GERD (gastroesophageal reflux disease)    Glaucoma    Hyperlipidemia    Ischemic colitis ([place])    Osteoporosis    Pneumonia    hx of x 2 as a child    Renal cyst, right    Retinopathy, background, proliferative     Past Surgical History:  Procedure Laterality Date   cervical vertebral fusion     c5-6, 6-7   CHOLECYSTECTOMY     COLONOSCOPY WITH PROPOFOL N/A 11/23/2016   Procedure: COLONOSCOPY WITH PROPOFOL;  Surgeon: Ladene Artist, MD;  Location: [facility] ENDOSCOPY;  Service: Endoscopy;  Laterality: N/A;   ESOPHAGOGASTRODUODENOSCOPY (EGD) WITH PROPOFOL N/A 11/23/2016   Procedure: ESOPHAGOGASTRODUODENOSCOPY (EGD) WITH PROPOFOL;  Surgeon: Ladene Artist, MD;  Location: [facility] ENDOSCOPY;  Service: Endoscopy;  Laterality: N/A;   FEMORAL-POPLITEAL BYPASS GRAFT     IR RADIOLOGIST EVAL & MGMT  06/06/2019   IR RADIOLOGIST EVAL & MGMT  07/02/2020   IR RADIOLOGIST EVAL & MGMT  07/08/2021    Social History:  reports that she has been smoking cigarettes. She has been smoking an average of 1 pack per day. She has never used smokeless tobacco. She reports that she does not drink alcohol and does not use drugs.   Allergies  Allergen Reactions   Vioxx [Rofecoxib] Swelling    Family History  Problem Relation Age of Onset   Other Father        some sort of blood cancer-had to get transfusions   Stomach cancer Maternal Grandmother    Stomach cancer Maternal Grandfather     Family history reviewed and not pertinent    Prior to Admission medications   Medication Sig Start Date End Date Taking? Authorizing Provider  acetaZOLAMIDE (DIAMOX) 250 MG tablet Take 1 tablet (250 mg total) by mouth every morning. 03/19/22   [name], MD  albuterol (PROAIR HFA) 108 (90 Base) MCG/ACT inhaler Inhale 2 puffs into the lungs every 6 (six) hours as needed  for wheezing or shortness of breath. 03/06/19   Alycia Rossetti, MD  albuterol (PROVENTIL) (2.5 MG/3ML) 0.083% nebulizer solution INHALE THE CONTENTS OF 1 VIAL VIA NEBULIZATION EVERY 6 HOURS AS NEEDED FOR [facility] AND SHORTNESS OF BREATH Patient taking differently: Take 2.5 mg by nebulization every 6 (six) hours as needed for wheezing or shortness of breath. 02/23/22   [name], MD  aspirin 81 MG tablet Take 81 mg by mouth daily.    [provider]  Cholecalciferol (VITAMIN D3) 250 MCG (10000 UT) capsule Take 1 capsule (10,000 Units total) by mouth daily. 01/20/19   [name], MD  ELDERBERRY PO Take 1 each by mouth daily.    [provider]  escitalopram (LEXAPRO) 10 MG tablet Take 1 tablet (10 mg total) by mouth daily. 04/20/22   [name], MD  Fluticasone-Umeclidin-Vilant (TRELEGY ELLIPTA) 100-62.5-25 MCG/ACT AEPB Inhale 1 Dose into the lungs daily. Patient taking differently: Inhale 1 Dose into the lungs every evening. 03/19/22   [name], MD  glipiZIDE (GLUCOTROL XL)  5 MG 24 hr tablet Take 1 tablet (5 mg total) by mouth every morning. 02/17/22   [name], MD  HYDROcodone-acetaminophen (NORCO/VICODIN) 5-325 MG tablet TAKE ONE TABLET BY MOUTH eveyr SIX hours AS NEEDED FOR moderate pain 05/21/22   [name], MD  losartan (COZAAR) 50 MG tablet Take 1 tablet (50 mg total) by mouth daily. 02/17/22   [name], MD  metFORMIN (GLUCOPHAGE) 1000 MG tablet TAKE ONE TABLET BY MOUTH TWICE DAILY WITH FOOD Patient taking differently: Take 1,000 mg by mouth 2 (two) times daily with a meal. 08/27/21   [name], MD  Multiple Vitamins-Minerals (CENTRUM SILVER 50+WOMEN) TABS Take 1 tablet by mouth daily. 01/20/19   [name], MD  omeprazole (PRILOSEC) 20 MG capsule TAKE 1 CAPSULE BY MOUTH TWICE A DAY BEFORE A MEAL Patient taking differently: Take 20 mg by mouth 2 (two) times daily before a meal. 02/17/22   [name], MD   rosuvastatin (CRESTOR) 20 MG tablet Take 1 tablet (20 mg total) by mouth every evening. 03/19/22   [name], MD  sitaGLIPtin (JANUVIA) 100 MG tablet Take 1 tablet (100 mg total) by mouth daily. 03/19/22   [name], MD     Objective    Physical Exam: Vitals:   06/09/22 1939 06/09/22 1940 06/09/22 1945 06/09/22 2030  BP:  132/78 (!) 140/81 (!) 166/68  Pulse:  (!) 114 (!) 110 (!) 109  Resp:  (!) 31 (!) 23 19  Temp:  100.3 F (37.9 C)    TempSrc:  Oral    SpO2:  100% 98% 99%  Weight: 73.2 kg     Height: '5\' 6"'$  (1.676 m)       General: appears to be stated age; alert ; confused increased work of breathing noted. Skin: warm, dry, no rash Head:  AT/[place] Mouth:  Oral mucosa membranes appear moist, normal dentition Neck: supple; trachea midline Heart:  RRR; did not appreciate any M/R/G Lungs: CTAB, did not appreciate any wheezes, rales, or rhonchi Abdomen: + BS; soft, ND, NT Vascular: 2+ pedal pulses b/l; 2+ radial pulses b/l Extremities: no peripheral edema, no muscle wasting Neuro: strength and sensation intact in upper and lower extremities b/l    Labs on Admission: I have personally reviewed following labs and imaging studies  CBC: Recent Labs  Lab 06/09/22 1952 06/09/22 2035  WBC 9.5  --   NEUTROABS 5.9  --   HGB 13.1 14.3  HCT 44.9 42.0  MCV 95.9  --   PLT 406*  --    Basic Metabolic Panel: Recent Labs  Lab 06/09/22 1952 06/09/22 2035  NA 140 141  K 3.7 3.8  CL 102  --   CO2 25  --   GLUCOSE 246*  --   BUN 16  --   CREATININE 0.84  --   CALCIUM 9.6  --    GFR: Estimated Creatinine Clearance: 54.7 mL/min (by C-G formula based on SCr of 0.84 mg/dL). Liver Function Tests: Recent Labs  Lab 06/09/22 1952  AST 39  ALT 48*  ALKPHOS 67  BILITOT 0.6  PROT 6.9  ALBUMIN 3.5   No results for input(s): "LIPASE", "AMYLASE" in the last 168 hours. No results for input(s): "AMMONIA" in the last 168 hours. Coagulation Profile: No results for  input(s): "INR", "PROTIME" in the last 168 hours. Cardiac Enzymes: No results for input(s): "CKTOTAL", "CKMB", "CKMBINDEX", "TROPONINI" in the last 168 hours. BNP (last 3 results) No results  for input(s): "PROBNP" in the last 8760 hours. HbA1C: No results for input(s): "HGBA1C" in the last 72 hours. CBG: No results for input(s): "GLUCAP" in the last 168 hours. Lipid Profile: No results for input(s): "CHOL", "HDL", "LDLCALC", "TRIG", "CHOLHDL", "LDLDIRECT" in the last 72 hours. Thyroid Function Tests: No results for input(s): "TSH", "T4TOTAL", "FREET4", "T3FREE", "THYROIDAB" in the last 72 hours. Anemia Panel: No results for input(s): "VITAMINB12", "FOLATE", "FERRITIN", "TIBC", "IRON", "RETICCTPCT" in the last 72 hours. Urine analysis:    Component Value Date/Time   COLORURINE YELLOW 05/15/2022 2144   APPEARANCEUR HAZY (A) 05/15/2022 2144   LABSPEC 1.020 05/15/2022 2144   PHURINE 6.0 05/15/2022 2144   GLUCOSEU >=500 (A) 05/15/2022 2144   HGBUR NEGATIVE 05/15/2022 2144   BILIRUBINUR NEGATIVE 05/15/2022 2144   KETONESUR 5 (A) 05/15/2022 2144   PROTEINUR 30 (A) 05/15/2022 2144   NITRITE POSITIVE (A) 05/15/2022 2144   LEUKOCYTESUR SMALL (A) 05/15/2022 2144    Radiological Exams on Admission: DG Chest Port 1 View  Result Date: 06/09/2022 CLINICAL DATA:  Shortness of breath. EXAM: PORTABLE CHEST 1 VIEW COMPARISON:  January 22, 2022 FINDINGS: The heart size and mediastinal contours are within normal limits. Mild, diffuse, chronic appearing increased interstitial lung markings are seen. Mild atelectasis is noted within the bilateral lung bases. There is no evidence of a pleural effusion or pneumothorax. A radiopaque fusion plate and screws are seen overlying the lower cervical spine. Multilevel degenerative changes seen throughout the thoracic spine. IMPRESSION: Chronic appearing increased interstitial lung markings with mild bibasilar atelectasis. Electronically Signed   By: Virgina Norfolk M.D.   On: 06/09/2022 20:04      Assessment/Plan   Principal Problem:   Acute exacerbation of chronic obstructive pulmonary disease (COPD) ([place]) Active Problems:   DM2 (diabetes mellitus, type 2) (HCC)   Essential hypertension   SOB (shortness of breath)   GERD (gastroesophageal reflux disease)   Hyperlipidemia   Depression   Chronic diastolic CHF (congestive heart failure) (HCC)      #) Acute COPD exacerbation: in the context of a documented history of COPD, diagnosis of acute exacerbation on the basis of 2 3 days of progressive shortness of breath associated with increased work of breathing, worsening nonproductive cough, wheezing, with presenting CXR showing no evidence of acute cardiopulmonary process, including no evidence of infiltrate, edema, effusion, or pneumothorax. Etiology of exac potentially viral in nature, given mildly elevated presenting temperature of 100.3.  Of note, while chest x-ray shows no evidence of infiltrate to suggest bacterial pneumonia, will also add on procalcitonin level to further assess.  There is a possibility of underlying non-COVID, RSV, influenza process.  However, per my discussions with the patient's POA, we will refrain from checking extended respiratory viral panel, as this would likely not change management regardless of the results of the study.  no clinical or radiographic evidence to suggest acutely decompensated heart failure at this time. Additionally, ACS appears less likely in the absence of chest pain, with EKG showing no evidence of acute ischemic changes. Clinically, acute PE also appears to be less likely at this time. COVID-19/influenza, RSV PCR are negative.   She is a long term smoker, with greater than 60-pack-year history, who continues to smoke.  In the setting of her end-stage COPD, POA conveys desire to transition to home hospice, and is amenable to meeting with palliative care service to assist with expediting this  process, with goal, per POA, to be discharged to home with home hospice on  Thursday, 06/11/22.  Per discussions with POA, patient is DNR/DNI and would not want non-invasive mechanical ventilation.    Plan: monitor continuous pulse oxymetry. Solumedrol. Scheduled Xopenex/ipratropium nebulizer treatments q6 hours. Prn Xopenex nebulizer. BMP in the morning. Repeat CBC in the morning. Check  Phos levels.  Repeat serum magnesium level in the morning.  Will attempt additional chart review to evaluate most recent PFT results. Will start Azithromycin  for benefit of shortened duration of hospitalization associated with antibiotic initiation in the setting of acute COPD exacerbation. Will follow for results VBG already ordered in the ED.   Add-on procalcitonin.  Placed order for consultation to palliative care service to assist with expediting goal of home hospice, as above.  Add on BMP.              #) GERD: documented h/o such; on omeprazole as outpatient.   Plan: continue home PPI.                 #) Hyperlipidemia: documented h/o such. On high intensity rosuvastatin as outpatient.   Plan: continue home statin.                #) Type 2 Diabetes Mellitus: documented history of such. Home insulin regimen: None. Home oral hypoglycemic agents: Glipizide, metformin.  She is also on Januvia at home.  Presenting blood sugar: 246, with increased risk for instituting further relative increase in glycemic values given plan for additional systemic corticosteroids as component of management of presenting acute COPD exacerbation. Most recent A1c noted to be 7.4% when checked in October 2023.   Plan: accuchecks QAC and HS with low dose SSI. hold home oral hypoglycemic agents during this hospitalization.              #) Depression: documented h/o such. On Lexapro as outpatient.  Of note, presenting EKG shows no evidence of QTc prolongation.  Plan: Continue home  Lexapro.             #) Essential Hypertension: documented h/o such, with outpatient antihypertensive regimen including losartan.  SBP's in the ED today: 130s -140s mmHg. will hold next dose of losartan, close monitoring and seeing blood pressures to assist with timing of resumption of this outpatient hypertensive medication.  Plan: Close monitoring of subsequent BP via routine VS. holding losartan for now.                   #) Chronic diastolic heart failure: documented history of such, with most recent echocardiogram performed in August 2021, which is notable for LVEF greater than [id number], grade 1 diastolic/, with additional details as conveyed above. No clinical or evidence to suggest acutely decompensated heart failure at this time. home diuretic regimen reportedly consists of the following: Acetazolamide.  Will hold next dose of acetazolamide, closely monitoring ensuing volume status to help guide timing of resumption of outpatient diuretic medication.  Plan: monitor strict I's & O's and daily weights. Repeat CMP in AM. Check serum mag level.  Hold home diuretic for now.  Add on BMP.        DVT prophylaxis: SCD's   Code Status: DNR/DNI, as confirmed by the patient's POA, per my discussions with the POA at bedside Family Communication:  discussed the patient's case with her power of attorney, son Briana Barr, who is present at bedside, as further detailed above Disposition Plan: Per Rounding Team Consults called: none;  Admission status: Observation     I SPENT GREATER THAN 75  MINUTES  IN CLINICAL CARE TIME/MEDICAL DECISION-MAKING IN COMPLETING THIS ADMISSION.      Winkler DO [facility]  From [place]   06/09/2022, 9:32 PM

## 2022-06-10 DIAGNOSIS — J9811 Atelectasis: Secondary | ICD-10-CM | POA: Diagnosis present

## 2022-06-10 DIAGNOSIS — E11319 Type 2 diabetes mellitus with unspecified diabetic retinopathy without macular edema: Secondary | ICD-10-CM | POA: Diagnosis present

## 2022-06-10 DIAGNOSIS — F0393 Unspecified dementia, unspecified severity, with mood disturbance: Secondary | ICD-10-CM | POA: Diagnosis present

## 2022-06-10 DIAGNOSIS — Z515 Encounter for palliative care: Secondary | ICD-10-CM | POA: Diagnosis not present

## 2022-06-10 DIAGNOSIS — F17210 Nicotine dependence, cigarettes, uncomplicated: Secondary | ICD-10-CM | POA: Diagnosis present

## 2022-06-10 DIAGNOSIS — M810 Age-related osteoporosis without current pathological fracture: Secondary | ICD-10-CM | POA: Diagnosis present

## 2022-06-10 DIAGNOSIS — Z7982 Long term (current) use of aspirin: Secondary | ICD-10-CM | POA: Diagnosis not present

## 2022-06-10 DIAGNOSIS — Z9981 Dependence on supplemental oxygen: Secondary | ICD-10-CM | POA: Diagnosis not present

## 2022-06-10 DIAGNOSIS — Z79899 Other long term (current) drug therapy: Secondary | ICD-10-CM | POA: Diagnosis not present

## 2022-06-10 DIAGNOSIS — Z1152 Encounter for screening for COVID-19: Secondary | ICD-10-CM | POA: Diagnosis not present

## 2022-06-10 DIAGNOSIS — Z7951 Long term (current) use of inhaled steroids: Secondary | ICD-10-CM | POA: Diagnosis not present

## 2022-06-10 DIAGNOSIS — J441 Chronic obstructive pulmonary disease with (acute) exacerbation: Principal | ICD-10-CM | POA: Diagnosis present

## 2022-06-10 DIAGNOSIS — K2270 Barrett's esophagus without dysplasia: Secondary | ICD-10-CM | POA: Diagnosis present

## 2022-06-10 DIAGNOSIS — T380X5A Adverse effect of glucocorticoids and synthetic analogues, initial encounter: Secondary | ICD-10-CM | POA: Diagnosis not present

## 2022-06-10 DIAGNOSIS — Z7401 Bed confinement status: Secondary | ICD-10-CM | POA: Diagnosis not present

## 2022-06-10 DIAGNOSIS — E1165 Type 2 diabetes mellitus with hyperglycemia: Secondary | ICD-10-CM | POA: Diagnosis not present

## 2022-06-10 DIAGNOSIS — F32A Depression, unspecified: Secondary | ICD-10-CM | POA: Diagnosis present

## 2022-06-10 DIAGNOSIS — E785 Hyperlipidemia, unspecified: Secondary | ICD-10-CM | POA: Diagnosis present

## 2022-06-10 DIAGNOSIS — Z981 Arthrodesis status: Secondary | ICD-10-CM | POA: Diagnosis not present

## 2022-06-10 DIAGNOSIS — Z7984 Long term (current) use of oral hypoglycemic drugs: Secondary | ICD-10-CM | POA: Diagnosis not present

## 2022-06-10 DIAGNOSIS — R531 Weakness: Secondary | ICD-10-CM | POA: Diagnosis not present

## 2022-06-10 DIAGNOSIS — I5032 Chronic diastolic (congestive) heart failure: Secondary | ICD-10-CM | POA: Diagnosis present

## 2022-06-10 DIAGNOSIS — K219 Gastro-esophageal reflux disease without esophagitis: Secondary | ICD-10-CM | POA: Diagnosis present

## 2022-06-10 DIAGNOSIS — I110 Hypertensive heart disease with heart failure: Secondary | ICD-10-CM | POA: Diagnosis present

## 2022-06-10 DIAGNOSIS — J9611 Chronic respiratory failure with hypoxia: Secondary | ICD-10-CM | POA: Diagnosis present

## 2022-06-10 DIAGNOSIS — Z66 Do not resuscitate: Secondary | ICD-10-CM | POA: Diagnosis present

## 2022-06-10 LAB — COMPREHENSIVE METABOLIC PANEL
ALT: 55 U/L — ABNORMAL HIGH (ref 0–44)
AST: 46 U/L — ABNORMAL HIGH (ref 15–41)
Albumin: 3.4 g/dL — ABNORMAL LOW (ref 3.5–5.0)
Alkaline Phosphatase: 63 U/L (ref 38–126)
Anion gap: 13 (ref 5–15)
BUN: 16 mg/dL (ref 8–23)
CO2: 24 mmol/L (ref 22–32)
Calcium: 9.1 mg/dL (ref 8.9–10.3)
Chloride: 107 mmol/L (ref 98–111)
Creatinine, Ser: 0.84 mg/dL (ref 0.44–1.00)
GFR, Estimated: >60 mL/min (ref >60)
Glucose, Bld: 379 mg/dL — ABNORMAL HIGH (ref 70–99)
Potassium: 4 mmol/L (ref 3.5–5.1)
Sodium: 144 mmol/L (ref 135–145)
Total Bilirubin: 0.5 mg/dL (ref 0.3–1.2)
Total Protein: 7 g/dL (ref 6.5–8.1)

## 2022-06-10 LAB — CBC WITH DIFFERENTIAL/PLATELET
Abs Immature Granulocytes: 0.17 10*3/uL — ABNORMAL HIGH (ref 0.00–0.07)
Basophils Absolute: 0.1 10*3/uL (ref 0.0–0.1)
Basophils Relative: 1 %
Eosinophils Absolute: 0.1 10*3/uL (ref 0.0–0.5)
Eosinophils Relative: 1 %
HCT: 42.6 % (ref 36.0–46.0)
Hemoglobin: 12.9 g/dL (ref 12.0–15.0)
Immature Granulocytes: 2 %
Lymphocytes Relative: 5 %
Lymphs Abs: 0.5 10*3/uL — ABNORMAL LOW (ref 0.7–4.0)
MCH: 28.4 pg (ref 26.0–34.0)
MCHC: 30.3 g/dL (ref 30.0–36.0)
MCV: 93.8 fL (ref 80.0–100.0)
Monocytes Absolute: 0.2 10*3/uL (ref 0.1–1.0)
Monocytes Relative: 2 %
Neutro Abs: 8 10*3/uL — ABNORMAL HIGH (ref 1.7–7.7)
Neutrophils Relative %: 89 %
Platelets: 420 10*3/uL — ABNORMAL HIGH (ref 150–400)
RBC: 4.54 MIL/uL (ref 3.87–5.11)
RDW: 15.1 % (ref 11.5–15.5)
WBC: 9 10*3/uL (ref 4.0–10.5)
nRBC: 0 % (ref 0.0–0.2)

## 2022-06-10 LAB — BLOOD GAS, VENOUS
Acid-base deficit: 2.8 mmol/L — ABNORMAL HIGH (ref 0.0–2.0)
Bicarbonate: 25.7 mmol/L (ref 20.0–28.0)
O2 Saturation: 95.5 %
Patient temperature: 37.2
pCO2, Ven: 60 mmHg (ref 44–60)
pH, Ven: 7.24 — ABNORMAL LOW (ref 7.25–7.43)
pO2, Ven: 84 mmHg — ABNORMAL HIGH (ref 32–45)

## 2022-06-10 LAB — PHOSPHORUS: Phosphorus: 3.3 mg/dL (ref 2.5–4.6)

## 2022-06-10 LAB — GLUCOSE, CAPILLARY
Glucose-Capillary: 388 mg/dL — ABNORMAL HIGH (ref 70–99)
Glucose-Capillary: 308 mg/dL — ABNORMAL HIGH (ref 70–99)
Glucose-Capillary: 195 mg/dL — ABNORMAL HIGH (ref 70–99)

## 2022-06-10 MED ORDER — INSULIN ASPART 100 UNIT/ML IJ SOLN
0.0000 [IU] | Freq: Three times a day (TID) | INTRAMUSCULAR | Status: DC
  Administered 2022-06-10: 4 [IU] via SUBCUTANEOUS

## 2022-06-10 MED ORDER — GUAIFENESIN ER 600 MG PO TB12: 600.0000 mg | ORAL_TABLET | Freq: Two times a day (BID) | ORAL | 0 refills | Status: AC

## 2022-06-10 MED ORDER — NICOTINE 21 MG/24HR TD PT24: 21.0000 mg | MEDICATED_PATCH | Freq: Every day | TRANSDERMAL | 0 refills | Status: AC | PRN

## 2022-06-10 MED ORDER — AZITHROMYCIN 500 MG PO TABS: 500.0000 mg | ORAL_TABLET | Freq: Every day | ORAL | 0 refills | Status: AC

## 2022-06-10 MED ORDER — INSULIN GLARGINE-YFGN 100 UNIT/ML ~~LOC~~ SOLN
15.0000 [IU] | Freq: Every day | SUBCUTANEOUS | Status: DC
  Administered 2022-06-10: 15 [IU] via SUBCUTANEOUS
  Filled 2022-06-10: qty 0.15

## 2022-06-10 MED ORDER — PREDNISONE 10 MG PO TABS: 10.0000 mg | ORAL_TABLET | Freq: Every day | ORAL | 0 refills | Status: AC

## 2022-06-10 MED ORDER — METHYLPREDNISOLONE SODIUM SUCC 40 MG IJ SOLR: 40.0000 mg | Freq: Two times a day (BID) | INTRAMUSCULAR | Status: DC

## 2022-06-10 NOTE — ED Notes (Signed)
ED TO INPATIENT HANDOFF REPORT  ED Nurse Name and Phone #: Shaylin Blatt [phone]  S Name/Age/Gender Briana Barr 82 y.o. female Room/Bed: 044C/044C  Code Status   Code Status: DNR  Home/SNF/Other Home Patient oriented to: self Is this baseline? Yes   Triage Complete: Triage complete  Chief Complaint Acute exacerbation of chronic obstructive pulmonary disease (COPD) ([place]) [J44.1]  Triage Note PT HAS HAD SOB ALL DAY WITH WHEEZING - SON HAS TRIED TO GET NEBS ALL DAY BUT HAS NOT HAD - PT BIB EMS FROM [facility]. PT ON 6L @ HOME.  PT HAD 3 NEB WITH EMS   Allergies Allergies  Allergen Reactions   Vioxx [Rofecoxib] Swelling    Level of Care/Admitting Diagnosis ED Disposition     ED Disposition  Admit   Condition  --   Comment  Hospital Area: [place] [id number]  Level of Care: Med-Surg [16]  May place patient in observation at [facility] or [place] if equivalent level of care is available:: No  Covid Evaluation: Asymptomatic - no recent exposure (last 10 days) testing not required  Diagnosis: Acute exacerbation of chronic obstructive pulmonary disease (COPD) [facility]) [id number]  Admitting Physician: Rhetta Mura [id number]  Attending Physician: Rhetta Mura [id number]          B Medical/Surgery History Past Medical History:  Diagnosis Date   Barrett esophagus    Colon polyps    benign   COPD (chronic obstructive pulmonary disease) ([place])    Duodenal ulcer    DVT (deep venous thrombosis) (HCC)    left leg x 2   Gallstones    GERD (gastroesophageal reflux disease)    Glaucoma    Hyperlipidemia    Ischemic colitis ([place])    Osteoporosis    Pneumonia    hx of x 2 as a child    Renal cyst, right    Retinopathy, background, proliferative    Past Surgical History:  Procedure Laterality Date   cervical vertebral fusion     c5-6, 6-7   CHOLECYSTECTOMY     COLONOSCOPY WITH PROPOFOL N/A 11/23/2016   Procedure: COLONOSCOPY WITH PROPOFOL;   Surgeon: Ladene Artist, MD;  Location: [facility];  Service: Endoscopy;  Laterality: N/A;   ESOPHAGOGASTRODUODENOSCOPY (EGD) WITH PROPOFOL N/A 11/23/2016   Procedure: ESOPHAGOGASTRODUODENOSCOPY (EGD) WITH PROPOFOL;  Surgeon: Ladene Artist, MD;  Location: [facility];  Service: Endoscopy;  Laterality: N/A;   FEMORAL-POPLITEAL BYPASS GRAFT     IR RADIOLOGIST EVAL & MGMT  06/06/2019   IR RADIOLOGIST EVAL & MGMT  07/02/2020   IR RADIOLOGIST EVAL & MGMT  07/08/2021     A IV Location/Drains/Wounds Patient Lines/Drains/Airways Status     Active Line/Drains/Airways     Name Placement date Placement time Site Days   Peripheral IV 06/09/22 Anterior;Distal;Left Forearm 06/09/22  1907  Forearm  1   External Urinary Catheter 05/16/22  0800  --  25            Intake/Output Last 24 hours  Intake/Output Summary (Last 24 hours) at 06/10/2022 0023 Last data filed at 06/09/2022 2054 Gross per 24 hour  Intake 1000 ml  Output --  Net 1000 ml    Labs/Imaging Results for orders placed or performed during the hospital encounter of 06/09/22 (from the past 48 hour(s))  Resp panel by RT-PCR (RSV, Flu A&B, Covid) Anterior Nasal Swab     Status: None   Collection Time: 06/09/22  7:40 PM   Specimen: Anterior Nasal  Swab  Result Value Ref Range   SARS Coronavirus 2 by RT PCR NEGATIVE NEGATIVE   Influenza A by PCR NEGATIVE NEGATIVE   Influenza B by PCR NEGATIVE NEGATIVE    Comment: (NOTE) The Xpert Xpress SARS-CoV-2/FLU/RSV plus assay is intended as an aid in the diagnosis of influenza from Nasopharyngeal swab specimens and should not be used as a sole basis for treatment. Nasal washings and aspirates are unacceptable for Xpert Xpress SARS-CoV-2/FLU/RSV testing.  Fact Sheet for Patients: EntrepreneurPulse.com.au  Fact Sheet for Healthcare Providers: IncredibleEmployment.be  This test is not yet approved or cleared by the [place] FDA and has been  authorized for detection and/or diagnosis of SARS-CoV-2 by FDA under an Emergency Use Authorization (EUA). This EUA will remain in effect (meaning this test can be used) for the duration of the COVID-19 declaration under Section 564(b)(1) of the Act, 21 U.S.C. section 360bbb-3(b)(1), unless the authorization is terminated or revoked.     Resp Syncytial Virus by PCR NEGATIVE NEGATIVE    Comment: (NOTE) Fact Sheet for Patients: EntrepreneurPulse.com.au  Fact Sheet for Healthcare Providers: IncredibleEmployment.be  This test is not yet approved or cleared by the [place] FDA and has been authorized for detection and/or diagnosis of SARS-CoV-2 by FDA under an Emergency Use Authorization (EUA). This EUA will remain in effect (meaning this test can be used) for the duration of the COVID-19 declaration under Section 564(b)(1) of the Act, 21 U.S.C. section 360bbb-3(b)(1), unless the authorization is terminated or revoked.  Performed at [facility], [place] [address], [place], [place] [address]   CBC with Differential     Status: Abnormal   Collection Time: 06/09/22  7:52 PM  Result Value Ref Range   WBC 9.5 4.0 - 10.5 K/uL   RBC 4.68 3.87 - 5.11 MIL/uL   Hemoglobin 13.1 12.0 - 15.0 g/dL   HCT 44.9 36.0 - 46.0 %   MCV 95.9 80.0 - 100.0 fL   MCH 28.0 26.0 - 34.0 pg   MCHC 29.2 (L) 30.0 - 36.0 g/dL   RDW 14.9 11.5 - 15.5 %   Platelets 406 (H) 150 - 400 K/uL   nRBC 0.0 0.0 - 0.2 %   Neutrophils Relative % 62 %   Neutro Abs 5.9 1.7 - 7.7 K/uL   Lymphocytes Relative 20 %   Lymphs Abs 1.9 0.7 - 4.0 K/uL   Monocytes Relative 14 %   Monocytes Absolute 1.3 (H) 0.1 - 1.0 K/uL   Eosinophils Relative 2 %   Eosinophils Absolute 0.2 0.0 - 0.5 K/uL   Basophils Relative 1 %   Basophils Absolute 0.1 0.0 - 0.1 K/uL   Immature Granulocytes 1 %   Abs Immature Granulocytes 0.10 (H) 0.00 - 0.07 K/uL    Comment: Performed at [facility] [address], [place], [place] [address]  Comprehensive metabolic panel     Status: Abnormal   Collection Time: 06/09/22  7:52 PM  Result Value Ref Range   Sodium 140 135 - 145 mmol/L   Potassium 3.7 3.5 - 5.1 mmol/L   Chloride 102 98 - 111 mmol/L   CO2 25 22 - 32 mmol/L   Glucose, Bld 246 (H) 70 - 99 mg/dL    Comment: Glucose reference range applies only to samples taken after fasting for at least 8 hours.   BUN 16 8 - 23 mg/dL   Creatinine, Ser 0.84 0.44 - 1.00 mg/dL   Calcium 9.6 8.9 - 10.3 mg/dL  Total Protein 6.9 6.5 - 8.1 g/dL   Albumin 3.5 3.5 - 5.0 g/dL   AST 39 15 - 41 U/L   ALT 48 (H) 0 - 44 U/L   Alkaline Phosphatase 67 38 - 126 U/L   Total Bilirubin 0.6 0.3 - 1.2 mg/dL   GFR, Estimated >60 >60 mL/min    Comment: (NOTE) Calculated using the CKD-EPI Creatinine Equation (2021)    Anion gap 13 5 - 15    Comment: Performed at [facility] [address] [place], [place] [address]  Magnesium     Status: None   Collection Time: 06/09/22  7:52 PM  Result Value Ref Range   Magnesium 1.7 1.7 - 2.4 mg/dL    Comment: Performed at [facility], [place] [address], [place], [place] [address]  Procalcitonin     Status: None   Collection Time: 06/09/22  7:52 PM  Result Value Ref Range   Procalcitonin <0.10 ng/mL    Comment:        Interpretation: PCT (Procalcitonin) <= 0.5 ng/mL: Systemic infection (sepsis) is not likely. Local bacterial infection is possible. (NOTE)       Sepsis PCT Algorithm           Lower Respiratory Tract                                      Infection PCT Algorithm    ----------------------------     ----------------------------         PCT < 0.25 ng/mL                PCT < 0.10 ng/mL          Strongly encourage             Strongly discourage   discontinuation of antibiotics    initiation of antibiotics    ----------------------------     -----------------------------       PCT 0.25 - 0.50 ng/mL            PCT 0.10 - 0.25 ng/mL                OR       >80% decrease in PCT            Discourage initiation of                                            antibiotics      Encourage discontinuation           of antibiotics    ----------------------------     -----------------------------         PCT >= 0.50 ng/mL              PCT 0.26 - 0.50 ng/mL               AND        <80% decrease in PCT             Encourage initiation of                                             antibiotics  Encourage continuation           of antibiotics    ----------------------------     -----------------------------        PCT >= 0.50 ng/mL                  PCT > 0.50 ng/mL               AND         increase in PCT                  Strongly encourage                                      initiation of antibiotics    Strongly encourage escalation           of antibiotics                                     -----------------------------                                           PCT <= 0.25 ng/mL                                                 OR                                        > 80% decrease in PCT                                      Discontinue / Do not initiate                                             antibiotics  Performed at [facility], [address], [place], [place] [address]   Brain natriuretic peptide     Status: None   Collection Time: 06/09/22  7:52 PM  Result Value Ref Range   B Natriuretic Peptide 30.0 0.0 - 100.0 pg/mL    Comment: Performed at [facility] [address], [place], [place] [address]  I-Stat venous blood gas, ED     Status: Abnormal   Collection Time: 06/09/22  8:35 PM  Result Value Ref Range   pH, Ven 7.285 7.25 - 7.43   pCO2, Ven 56.0 44 - 60 mmHg   pO2, Ven 144 (H) 32 - 45 mmHg   Bicarbonate 26.6 20.0 - 28.0 mmol/L   TCO2 28 22 - 32 mmol/L   O2 Saturation 99 %   Acid-base deficit 1.0 0.0 - 2.0 mmol/L   Sodium 141 135 - 145 mmol/L   Potassium 3.8 3.5 - 5.1 mmol/L   Calcium, Ion  1.25 1.15 - 1.40 mmol/L   HCT 42.0 36.0 -  46.0 %   Hemoglobin 14.3 12.0 - 15.0 g/dL   Sample type VENOUS    DG Chest Port 1 View  Result Date: 06/09/2022 CLINICAL DATA:  Shortness of breath. EXAM: PORTABLE CHEST 1 VIEW COMPARISON:  January 22, 2022 FINDINGS: The heart size and mediastinal contours are within normal limits. Mild, diffuse, chronic appearing increased interstitial lung markings are seen. Mild atelectasis is noted within the bilateral lung bases. There is no evidence of a pleural effusion or pneumothorax. A radiopaque fusion plate and screws are seen overlying the lower cervical spine. Multilevel degenerative changes seen throughout the thoracic spine. IMPRESSION: Chronic appearing increased interstitial lung markings with mild bibasilar atelectasis. Electronically Signed   By: Virgina Norfolk M.D.   On: 06/09/2022 20:04    Pending Labs Unresulted Labs (From admission, onward)     Start     Ordered   06/10/22 0500  CBC with Differential/Platelet  Tomorrow morning,   R        06/09/22 2124   06/10/22 0500  Comprehensive metabolic panel  Tomorrow morning,   R        06/09/22 2124   06/10/22 0500  Phosphorus  Tomorrow morning,   R        06/09/22 2124   06/09/22 1941  Blood gas, venous (at [facility] and [facility])  Once,   R        06/09/22 1940            Vitals/Pain Today's Vitals   06/09/22 1945 06/09/22 2030 06/09/22 2152 06/10/22 0018  BP: (!) 140/81 (!) 166/68    Pulse: (!) 110 (!) 109    Resp: (!) 23 19    Temp:   98.4 F (36.9 C)   TempSrc:   Oral   SpO2: 98% 99%    Weight:      Height:      PainSc:    5     Isolation Precautions No active isolations  Medications Medications  acetaminophen (TYLENOL) tablet 650 mg (has no administration in time range)    Or  acetaminophen (TYLENOL) suppository 650 mg (has no administration in time range)  melatonin tablet 3 mg (has no administration in time range)  levalbuterol (XOPENEX) nebulizer solution 1.25 mg (has no  administration in time range)  levalbuterol (XOPENEX) nebulizer solution 1.25 mg (1.25 mg Nebulization Given 06/10/22 0001)  ipratropium (ATROVENT) nebulizer solution 0.5 mg (0.5 mg Nebulization Given 06/10/22 0001)  methylPREDNISolone sodium succinate (SOLU-MEDROL) 125 mg/2 mL injection 80 mg (has no administration in time range)  azithromycin (ZITHROMAX) 500 mg in sodium chloride 0.9 % 250 mL IVPB (0 mg Intravenous Stopped 06/09/22 2358)  aspirin EC tablet 81 mg (has no administration in time range)  escitalopram (LEXAPRO) tablet 10 mg (has no administration in time range)  HYDROcodone-acetaminophen (NORCO/VICODIN) 5-325 MG per tablet 1 tablet (1 tablet Oral Given 06/10/22 0002)  pantoprazole (PROTONIX) EC tablet 40 mg (has no administration in time range)  rosuvastatin (CRESTOR) tablet 20 mg (has no administration in time range)  insulin aspart (novoLOG) injection 0-9 Units (has no administration in time range)  nicotine (NICODERM CQ - dosed in mg/24 hours) patch 21 mg (has no administration in time range)  methylPREDNISolone sodium succinate (SOLU-MEDROL) 125 mg/2 mL injection 125 mg (125 mg Intravenous Given 06/09/22 1955)  magnesium sulfate IVPB 2 g 50 mL (0 g Intravenous Stopped 06/09/22 2054)  albuterol (PROVENTIL) (2.5 MG/3ML) 0.083% nebulizer solution (15 mg/hr Nebulization Given 06/09/22 2029)  ipratropium (ATROVENT) nebulizer solution  1 mg (1 mg Nebulization Given 06/09/22 1955)  sodium chloride 0.9 % bolus 1,000 mL (0 mLs Intravenous Stopped 06/09/22 2054)  acetaminophen (TYLENOL) tablet 650 mg (650 mg Oral Given 06/09/22 2009)    Mobility non-ambulatory     Focused Assessments Cardiac Assessment Handoff:    No results found for: "CKTOTAL", "CKMB", "CKMBINDEX", "TROPONINI" Lab Results  Component Value Date   DDIMER 3.21 (H) 11/13/2019   Does the Patient currently have chest pain? No    R Recommendations: See Admitting Provider Note  Report given to:   Additional Notes:

## 2022-06-10 NOTE — Progress Notes (Signed)
SCD placed per order.

## 2022-06-10 NOTE — TOC Initial Note (Signed)
Transition of Care [facility]) - Initial/Assessment Note    Patient Details  Name: Briana Barr MRN: [id number] Date of Birth: [birth year]  [facility] CM/SW Contact:    Curlene Labrum, RN Phone Number: 06/10/2022, 2:44 PM  Clinical Narrative:                 CM called and spoke with the patient's son, Skipper Cliche by phone and the family plans for the patient to be discharged home with Hospice care through [facility].  The patient has all DME at the home including a hospital bed, 3:1, oxygen, and shower bench.  The patient will have 24 hour care at the home with available family members, including a son, Shanon Brow that lives with the patient.  The patient's daughter will meet with me at the bedside at 1500 to update regarding her discharge home and I will have bedside nursing go over discharge instructions with the daughter prior to patient's discharge to home.  PTAR will be arranged.  I spoke with Lorayne Bender, CM with [facility] and she is aware and plans to send an admission RN to the home at 9 am in the morning for admission to hospice care.  The daughter is aware.  Expected Discharge Plan: Home w Hospice Care Barriers to Discharge: Continued Medical Work up   Patient Goals and CMS Choice Patient states their goals for this hospitalization and ongoing recovery are:: Patient's son prefers patient to return home with home hospice care. CMS Medicare.gov Compare Post Acute Care list provided to:: Patient Represenative (must comment) (Patient's son, Skipper Cliche)   [place] ownership interest in [facility].provided to:: Adult Children (Patient's son, Dorethea Clan lives with the patient at her home and provides 24 hour care.)    Expected Discharge Plan and Services   Discharge Planning Services: CM Consult Post Acute Care Choice: Hospice Living arrangements for the past 2 months: [place] Date [place]: 06/10/22 Time [place]: 59 Representative spoke with at [place]: [place], [place] with Manufacturing engineer for [place]  Prior Living Arrangements/Services Living arrangements for the past 2 months: [place] with:: Adult Children Patient language and need for interpreter reviewed:: Yes Do you feel safe going back to the place where you live?: Yes      Need for Family Participation in Patient Care: Yes (Comment) Care giver support system in place?: Yes (comment) Current home services: DME (Patient has home oxygen, hospital bed, shower bench, and 3:1 at the home.) Criminal Activity/Legal Involvement Pertinent to Current Situation/Hospitalization: No - Comment as needed  Activities of Daily Living   ADL Screening (condition at time of admission) Patient's cognitive ability adequate to safely complete daily activities?: No Is the patient deaf or have difficulty hearing?: No Does the patient have difficulty seeing, even when wearing glasses/contacts?: No Does the patient have difficulty concentrating, remembering, or making decisions?: Yes Patient able to express need for assistance with ADLs?: Yes Does the patient have difficulty dressing or bathing?: Yes Independently performs ADLs?: No Communication: Independent Dressing (OT): Needs assistance Grooming: Needs assistance Feeding: Needs assistance Bathing: Needs assistance Toileting: Needs assistance In/Out Bed: Needs assistance Walks in Home: Independent with device (comment) Does the patient have difficulty walking or climbing stairs?: Yes Weakness  of Legs: Both Weakness of Arms/Hands: None  Permission Sought/Granted Permission sought to share information with : Family Supports (Patient's son, Skipper Cliche) Permission granted to share information with : Yes, Verbal Permission Granted     Permission granted to share info w AGENCY:  [facility] home hospice  Permission granted to share info w Relationship: son Skipper Cliche [id number]     Emotional Assessment Appearance:: Appears stated age Attitude/Demeanor/Rapport: Unable to Assess Affect (typically observed): Unable to Assess   Alcohol / Substance Use: Not Applicable Psych Involvement: No (comment)  Admission diagnosis:  Acute exacerbation of chronic obstructive pulmonary disease (COPD) (HCC) [J44.1] COPD exacerbation (HCC) [J44.1] Patient Active Problem List   Diagnosis Date Noted   COPD exacerbation ([place]) 06/10/2022   Acute exacerbation of chronic obstructive pulmonary disease (COPD) ([place]) 06/09/2022   SOB (shortness of breath) 06/09/2022   GERD (gastroesophageal reflux disease) 06/09/2022   Hyperlipidemia 06/09/2022   Depression 06/09/2022   Chronic diastolic CHF (congestive heart failure) ([place]) 06/09/2022   Dementia ([place]) 05/16/2022   Acute encephalopathy [id number]   Acute metabolic encephalopathy 123XX123   CAP (community acquired pneumonia) 03/14/2020   Bacteremia 11/14/2019   Acute respiratory failure with hypoxia ([place]) 11/13/2019   COPD (chronic obstructive pulmonary disease) ([place]) 11/13/2019   DM2 (diabetes mellitus, type 2) ([place]) 11/13/2019   Essential hypertension 11/13/2019   Community acquired pneumonia of left lower lobe of lung    Abnormal CT scan, colon    Benign neoplasm of sigmoid colon    Benign neoplasm of rectum    Loss of weight    Epigastric abdominal tenderness without rebound tenderness    Anorexia    PCP:  [name], MD Pharmacy:   [facility] - [place], [place] - [address] [place] [address] Phone: [phone] Fax: [phone]  [id number] [place] [facility] [address] [place] [address]Phone: [phone]: [place] - [address] [address]: [phone]: [phone]    Social Determinants of Health (SDOH) Social History: SDOH Screenings   Food Insecurity: No Food Insecurity (06/10/2022)  Housing: Low Risk  (06/10/2022)  Transportation Needs: No Transportation Needs (06/10/2022)  Utilities: Not At Risk (06/10/2022)  Alcohol Screen: Low Risk  (04/01/2022)  Depression (PHQ2-9): Low Risk  (04/01/2022)  Financial Resource Strain: Low Risk  (04/01/2022)  Physical Activity: Insufficiently Active (04/01/2022)  Social Connections: Moderately Isolated (04/01/2022)  Stress: No Stress Concern Present (04/01/2022)  Tobacco Use: High Risk (06/09/2022)   SDOH Interventions:     Readmission Risk Interventions     No data to display

## 2022-06-10 NOTE — Care Management Obs Status (Cosign Needed)
[place] NOTIFICATION   Patient Details  Name: Briana Barr MRN: [id number] Date of Birth: 21-Nov-1940   Medicare Observation Status Notification Given:  Yes    Curlene Labrum, RN 06/10/2022, 3:11 PM

## 2022-06-10 NOTE — Progress Notes (Signed)
PROGRESS NOTE  Briana Barr  [id number] DOB: [birth year] DOA: 06/09/2022 PCP: [name], MD   Brief Narrative:  Patient is a 82 year old female with history of stage IV COPD, chronic hypoxic respiratory failure on 6 L of oxygen, chronic diastolic CHF, type 2 diabetes, hypertension, hyperlipidemia, dementia who presented here with shortness of breath from home.  Report of 3 days history of progressive shortness of breath, nonproductive cough.  On 6 L of oxygen at baseline.  On presentation, patient was mildly febrile, tachycardic, maintaining saturation on 6 L.  Chest x-ray did not show any pneumonia.  Admitted for COPD exacerbation.  As per the family, they are interested on setting up home with hospice.  TOC, palliative care consulted.  Currently being managed for COPD exacerbation.  Assessment & Plan:  Principal Problem:   Acute exacerbation of chronic obstructive pulmonary disease (COPD) (HCC) Active Problems:   DM2 (diabetes mellitus, type 2) (HCC)   Essential hypertension   SOB (shortness of breath)   GERD (gastroesophageal reflux disease)   Hyperlipidemia   Depression   Chronic diastolic CHF (congestive heart failure) (HCC)   Acute COPD exacerbation: Presented with 2 to 3 days of progressive shortness of breath, worsening cough, wheezing.  No evidence of pneumonia as per chest x-ray.  COVID/RSV, influenza negative. He has 60-pack-year history of smoking, continues to smoke.  Started on bronchodilators, steroids continue incentive spirometer.  Also started on azithromycin.On 4 L of oxygen during my evaluation  Chronic hypoxic respiratory failure: Secondary to COPD.  On 6 L of oxygen per minute at home.  Currently at baseline  GERD: Continue PPI  Hyperlipidemia: Continue statin  Diabetes type 2: On glipizide, metformin, and Januvia at home.  High blood sugars most likely secondary to steroids.  Recent A1c of 7.4.  Continue sliding scale, long-acting insulin.  Monitor  blood sugars  Depression: On Lexapro at home  Hypertension: Takes losartan at home.  Currently blood stable without any medications  Chronic diastolic CHF: Currently looks euvolemic.  Last echo showed EF of [id number], grade 1 diastolic dysfunction.  Takes acetazolamide at home.  Goals of care: CODE STATUS is DNR.  Family interested on setting of hospice at home due to her history of COPD.  Palliative care, TOC consulted        DVT prophylaxis:SCDs Start: 06/09/22 2124     Code Status: DNR  Family Communication: Called and discussed with daughter in law on 2/28  Patient status:Inpatient  Patient is from :home  Anticipated discharge [id number] with hospice  Estimated DC date:1-2 days   Consultants: None  Procedures:None  Antimicrobials:  Anti-infectives (From admission, onward)    Start     Dose/Rate Route Frequency Ordered Stop   06/09/22 2130  azithromycin (ZITHROMAX) 500 mg in sodium chloride 0.9 % 250 mL IVPB       Note to Pharmacy: (In setting of acute copd exac)   500 mg 250 mL/hr over 60 Minutes Intravenous Every 24 hours 06/09/22 2128         Subjective: Patient seen and examined at bedside today.  Hemodynamically stable.  Looks very weak, deconditioned, lying in bed.  On 4 L of oxygen per minute.  Not in respite distress.  Denies any worsening shortness of breath or cough  Objective: Vitals:   06/10/22 0230 06/10/22 0441 06/10/22 0837 06/10/22 0849  BP: (!) 143/69   (!) 141/65  Pulse: (!) 110  (!) 102 (!) 101  Resp: '20  20 16  '$ Temp: 98.3 F (  36.8 C) 98.9 F (37.2 C)  98.2 F (36.8 C)  TempSrc: Oral Axillary  Oral  SpO2: 95%  95% 97%  Weight:      Height:        Intake/Output Summary (Last 24 hours) at 06/10/2022 1312 Last data filed at 06/10/2022 1000 Gross per 24 hour  Intake 1250.14 ml  Output 850 ml  Net 400.14 ml   Filed Weights   06/09/22 1939  Weight: 73.2 kg    Examination:  General exam:very weak,deconditioned HEENT:  PERRL Respiratory system: Diminished sounds bilaterally, mild expiratory wheezing  cardiovascular system: S1 & S2 heard, RRR.  Gastrointestinal system: Abdomen is nondistended, soft and nontender. Central nervous system: Alert and awake, oriented to place only Extremities: No edema, no clubbing ,no cyanosis Skin: No rashes, no ulcers,no icterus     Data Reviewed: I have personally reviewed following labs and imaging studies  CBC: Recent Labs  Lab 06/09/22 1952 06/09/22 2035 06/10/22 0431  WBC 9.5  --  9.0  NEUTROABS 5.9  --  8.0*  HGB 13.1 14.3 12.9  HCT 44.9 42.0 42.6  MCV 95.9  --  93.8  PLT 406*  --  0000000*   Basic Metabolic Panel: Recent Labs  Lab 06/09/22 1952 06/09/22 2035 06/10/22 0431  NA 140 141 144  K 3.7 3.8 4.0  CL 102  --  107  CO2 25  --  24  GLUCOSE 246*  --  379*  BUN 16  --  16  CREATININE 0.84  --  0.84  CALCIUM 9.6  --  9.1  MG 1.7  --   --   PHOS  --   --  3.3     Recent Results (from the past 240 hour(s))  Resp panel by RT-PCR (RSV, Flu A&B, Covid) Anterior Nasal Swab     Status: None   Collection Time: 06/09/22  7:40 PM   Specimen: Anterior Nasal Swab  Result Value Ref Range Status   SARS Coronavirus 2 by RT PCR NEGATIVE NEGATIVE Final   Influenza A by PCR NEGATIVE NEGATIVE Final   Influenza B by PCR NEGATIVE NEGATIVE Final    Comment: (NOTE) The Xpert Xpress SARS-CoV-2/FLU/RSV plus assay is intended as an aid in the diagnosis of influenza from Nasopharyngeal swab specimens and should not be used as a sole basis for treatment. Nasal washings and aspirates are unacceptable for Xpert Xpress SARS-CoV-2/FLU/RSV testing.  Fact Sheet for Patients: EntrepreneurPulse.com.au  Fact Sheet for Healthcare Providers: IncredibleEmployment.be  This test is not yet approved or cleared by the [place] FDA and has been authorized for detection and/or diagnosis of SARS-CoV-2 by FDA under an Emergency Use  Authorization (EUA). This EUA will remain in effect (meaning this test can be used) for the duration of the COVID-19 declaration under Section 564(b)(1) of the Act, 21 U.S.C. section 360bbb-3(b)(1), unless the authorization is terminated or revoked.     Resp Syncytial Virus by PCR NEGATIVE NEGATIVE Final    Comment: (NOTE) Fact Sheet for Patients: EntrepreneurPulse.com.au  Fact Sheet for Healthcare Providers: IncredibleEmployment.be  This test is not yet approved or cleared by the [place] FDA and has been authorized for detection and/or diagnosis of SARS-CoV-2 by FDA under an Emergency Use Authorization (EUA). This EUA will remain in effect (meaning this test can be used) for the duration of the COVID-19 declaration under Section 564(b)(1) of the Act, 21 U.S.C. section 360bbb-3(b)(1), unless the authorization is terminated or revoked.  Performed at [facility],  [address] [place], [place] [address]      Radiology Studies: DG Chest Port 1 View  Result Date: 06/09/2022 CLINICAL DATA:  Shortness of breath. EXAM: PORTABLE CHEST 1 VIEW COMPARISON:  January 22, 2022 FINDINGS: The heart size and mediastinal contours are within normal limits. Mild, diffuse, chronic appearing increased interstitial lung markings are seen. Mild atelectasis is noted within the bilateral lung bases. There is no evidence of a pleural effusion or pneumothorax. A radiopaque fusion plate and screws are seen overlying the lower cervical spine. Multilevel degenerative changes seen throughout the thoracic spine. IMPRESSION: Chronic appearing increased interstitial lung markings with mild bibasilar atelectasis. Electronically Signed   By: Virgina Norfolk M.D.   On: 06/09/2022 20:04    Scheduled Meds:  aspirin EC  81 mg Oral Daily   escitalopram  10 mg Oral Daily   insulin aspart  0-9 Units Subcutaneous TID WC   ipratropium  0.5 mg Nebulization Q6H    levalbuterol  1.25 mg Nebulization Q6H   methylPREDNISolone (SOLU-MEDROL) injection  80 mg Intravenous Q12H   pantoprazole  40 mg Oral Daily   rosuvastatin  20 mg Oral QPM   Continuous Infusions:  azithromycin Stopped (06/09/22 2358)     LOS: 0 days   Shelly Coss, MD [facility] P2/28/2024, 1:12 PM

## 2022-06-10 NOTE — Progress Notes (Addendum)
[facility] ([facility])  Plan was for patient to be admitted at home once discharged from [place] for [facility] hospice services. However, patient was transferred to the ED prior to admission.    Patient is not an active patient with [facility] services. [facility] will continue to follow for any discharge planning needs and to coordinate continuation of hospice care.   Addendum: Following discussion with patient son/Michael & Daughter/Jesy via telephone call, patient will discharge today via EMS. Patient scheduled for 9 am on 2/29 w/ [facility] staff for AV. TOC/Michelle & AP/Dr. Rolla Flatten aware.     If applicable, please send signed and completed DNR with patient/family upon discharge. Please provide prescriptions at discharge as needed to ensure ongoing symptom management and a transport packet.   [facility] information and contact numbers given to family.   Please call with any questions/concerns.    Thank you for the opportunity to participate in this patient's care   Phillis Haggis, MSW [facility] Liaison  [phone]

## 2022-06-10 NOTE — Progress Notes (Signed)
Palliative:  Noted palliative consult request. Noted outpatient palliative visit 06/04/22 with plans for home with hospice services. Goals are very clearly addressed in H&P by Dr. Velia Meyer. At this time I will consult TOC to help coordinate home with hospice. No further acute palliative needs identified at this time.   No charge  Vinie Sill, NP Palliative Medicine Team Pager [phone] (Please see [email or web address] for schedule) Team Phone [phone]

## 2022-06-10 NOTE — Discharge Summary (Signed)
Physician Discharge Summary  Romy Colwell PO:[id number] DOB: Dec 31, 1940 DOA: 06/09/2022  PCP: [name], MD  Admit date: 06/09/2022 Discharge date: 06/10/2022  Admitted From: Home Disposition:  Home  Discharge Condition:Stable CODE STATUS:FULL Diet recommendation:  Regular   Brief/Interim Summary: Patient is a 82 year old female with history of stage IV COPD, chronic hypoxic respiratory failure on 6 L of oxygen, chronic diastolic CHF, type 2 diabetes, hypertension, hyperlipidemia, dementia who presented here with shortness of breath from home. Report of 3 days history of progressive shortness of breath, nonproductive cough. On 6 L of oxygen at baseline. On presentation, patient was mildly febrile, tachycardic, maintaining saturation on 6 L. Chest x-ray did not show any pneumonia. Admitted for COPD exacerbation. As per the family, they are interested on setting up home with hospice. TOC, palliative care consulted.  Hospice set up at home today.  Currently she is on baseline oxygen requirement.  Being discharged home with hospice  Following problems were addressed during the hospitalization:  Acute COPD exacerbation: Presented with 2 to 3 days of progressive shortness of breath, worsening cough, wheezing.  No evidence of pneumonia as per chest x-ray.  COVID/RSV, influenza negative. He has 60-pack-year history of smoking, continues to smoke.  Started on bronchodilators, steroids continue incentive spirometer.  Also started on azithromycin.  Currently on baseline oxygen requirement.  Continue to prednisone on tapering dose   chronic hypoxic respiratory failure: Secondary to COPD.  On 6 L of oxygen per minute at home.  Currently at baseline   GERD: Continue PPI   Hyperlipidemia: Continue statin   Diabetes type 2: On glipizide, metformin, and Januvia at home.  High blood sugars most likely secondary to steroids.  Recent A1c of 7.4.  Continue home regimen  Depression: On Lexapro at home    Hypertension: Takes losartan at home.     Chronic diastolic CHF: Currently looks euvolemic.  Last echo showed EF of [id number], grade 1 diastolic dysfunction.  Takes acetazolamide at home.   Goals of care: CODE STATUS is DNR.  Family interested on setting of hospice at home due to her history of COPD.  Palliative care, TOC consulted     Discharge Diagnoses:  Principal Problem:   Acute exacerbation of chronic obstructive pulmonary disease (COPD) ([place]) Active Problems:   DM2 (diabetes mellitus, type 2) (HCC)   Essential hypertension   SOB (shortness of breath)   GERD (gastroesophageal reflux disease)   Hyperlipidemia   Depression   Chronic diastolic CHF (congestive heart failure) (HCC)   COPD exacerbation ([place])    Discharge Instructions  Discharge Instructions     Diet general   Complete by: As directed    Discharge instructions   Complete by: As directed    1)Please take prescribed medications as instructed 2)Follow up with your hospice services at home   Increase activity slowly   Complete by: As directed       Allergies as of 06/10/2022       Reactions   Vioxx [rofecoxib] Swelling        Medication List     TAKE these medications    acetaZOLAMIDE 250 MG tablet Commonly known as: DIAMOX Take 1 tablet (250 mg total) by mouth every morning.   albuterol 108 (90 Base) MCG/ACT inhaler Commonly known as: ProAir HFA Inhale 2 puffs into the lungs every 6 (six) hours as needed for wheezing or shortness of breath. What changed: Another medication with the same name was changed. Make sure you understand how and when  to take each.   albuterol (2.5 MG/3ML) 0.083% nebulizer solution Commonly known as: PROVENTIL INHALE THE CONTENTS OF 1 VIAL VIA NEBULIZATION EVERY 6 HOURS AS NEEDED FOR [facility] AND SHORTNESS OF BREATH What changed: See the new instructions.   aspirin 81 MG tablet Take 81 mg by mouth daily.   azithromycin 500 MG tablet Commonly known as: Zithromax Take  1 tablet (500 mg total) by mouth daily for 4 days. Take 1 tablet daily for 3 days.   Centrum Silver 50+Women Tabs Take 1 tablet by mouth daily.   ELDERBERRY PO Take 1 each by mouth daily.   escitalopram 10 MG tablet Commonly known as: LEXAPRO Take 1 tablet (10 mg total) by mouth daily.   glipiZIDE 5 MG 24 hr tablet Commonly known as: GLUCOTROL XL Take 1 tablet (5 mg total) by mouth every morning.   guaiFENesin 600 MG 12 hr tablet Commonly known as: Mucinex Take 1 tablet (600 mg total) by mouth 2 (two) times daily for 7 days.   HYDROcodone-acetaminophen 5-325 MG tablet Commonly known as: NORCO/VICODIN TAKE ONE TABLET BY MOUTH eveyr SIX hours AS NEEDED FOR moderate pain   losartan 50 MG tablet Commonly known as: COZAAR Take 1 tablet (50 mg total) by mouth daily.   metFORMIN 1000 MG tablet Commonly known as: GLUCOPHAGE TAKE ONE TABLET BY MOUTH TWICE DAILY WITH FOOD   nicotine 21 mg/24hr patch Commonly known as: NICODERM CQ - dosed in mg/24 hours Place 1 patch (21 mg total) onto the skin daily as needed (smoking cessation).   omeprazole 20 MG capsule Commonly known as: PRILOSEC TAKE 1 CAPSULE BY MOUTH TWICE A DAY BEFORE A MEAL What changed:  how much to take how to take this when to take this additional instructions   predniSONE 10 MG tablet Commonly known as: DELTASONE Take 1 tablet (10 mg total) by mouth daily. Take 4 pills daily for 3 days then 3 pills daily for 3 days then 2 pills daily for 3 days then 1 pill daily for 3 days then stop   rosuvastatin 20 MG tablet Commonly known as: CRESTOR Take 1 tablet (20 mg total) by mouth every evening.   sitaGLIPtin 100 MG tablet Commonly known as: Januvia Take 1 tablet (100 mg total) by mouth daily.   Trelegy Ellipta 100-62.5-25 MCG/ACT Aepb Generic drug: Fluticasone-Umeclidin-Vilant Inhale 1 Dose into the lungs daily. What changed: when to take this   Vitamin D3 250 MCG (10000 UT) Caps Generic drug:  Cholecalciferol Take 1 capsule (10,000 Units total) by mouth daily.        Follow-up Information     [facility] Follow up.   Specialty: Hospice and Palliative Medicine Why: [facility] will be providing home hospice support. Contact information: [place] [phone]               Allergies  Allergen Reactions   Vioxx [Rofecoxib] Swelling    Consultations: Palliative care   Procedures/Studies: [facility] Chest Port 1 View  Result Date: 06/09/2022 CLINICAL DATA:  Shortness of breath. EXAM: PORTABLE CHEST 1 VIEW COMPARISON:  January 22, 2022 FINDINGS: The heart size and mediastinal contours are within normal limits. Mild, diffuse, chronic appearing increased interstitial lung markings are seen. Mild atelectasis is noted within the bilateral lung bases. There is no evidence of a pleural effusion or pneumothorax. A radiopaque fusion plate and screws are seen overlying the lower cervical spine. Multilevel degenerative changes seen throughout the thoracic spine. IMPRESSION: Chronic appearing increased interstitial  lung markings with mild bibasilar atelectasis. Electronically Signed   By: Virgina Norfolk M.D.   On: 06/09/2022 20:04   CT CHEST ABDOMEN PELVIS W CONTRAST  Result Date: 05/15/2022 CLINICAL DATA:  Sepsis. Family called EMS, they report pt has been very weak the last 2 days with increased confusion. EXAM: CT CHEST, ABDOMEN, AND PELVIS WITH CONTRAST TECHNIQUE: Multidetector CT imaging of the chest, abdomen and pelvis was performed following the standard protocol during bolus administration of intravenous contrast. RADIATION DOSE REDUCTION: This exam was performed according to the departmental dose-optimization program which includes automated exposure control, adjustment of the mA and/or kV according to patient size and/or use of iterative reconstruction technique. CONTRAST:  71m OMNIPAQUE IOHEXOL 350 MG/ML SOLN COMPARISON:  None  Available. FINDINGS: CT CHEST FINDINGS Cardiovascular: Normal heart size. No significant pericardial effusion. The thoracic aorta is normal in caliber. Severe atherosclerotic plaque of the thoracic aorta. Moderate coronary artery calcifications. The main pulmonary artery is normal in caliber. No central pulmonary embolus. Mediastinum/Nodes: No enlarged mediastinal, hilar, or axillary lymph nodes. Thyroid gland, trachea, and esophagus demonstrate no significant findings. Lungs/Pleura: Mild left lower lobe bronchiectasis. Mild diffuse bronchial wall thickening. Bilateral lower lobe subsegmental atelectasis. No focal consolidation. No pulmonary nodule. No pulmonary mass. No pleural effusion. No pneumothorax. Musculoskeletal: No chest wall abnormality. No suspicious lytic or blastic osseous lesions. No acute displaced fracture. CT ABDOMEN PELVIS FINDINGS Hepatobiliary: The hepatic parenchyma is diffusely hypodense compared to the splenic parenchyma consistent with fatty infiltration. No focal liver abnormality. Status post cholecystectomy. No biliary dilatation. Pancreas: No focal lesion. Normal pancreatic contour. No surrounding inflammatory changes. No main pancreatic ductal dilatation. Spleen: Normal in size without focal abnormality. Adrenals/Urinary Tract: No adrenal nodule bilaterally. Bilateral kidneys enhance symmetrically. Bilateral punctate nephrolithiasis. No ureterolithiasis. No hydronephrosis. No hydroureter. Fluid density lesion within the right kidney likely represents simple renal cyst. Simple renal cysts, in the absence of clinically indicated signs/symptoms, require no independent follow-up. The urinary bladder is unremarkable. On delayed imaging, there is no urothelial wall thickening and there are no filling defects in the opacified portions of the bilateral collecting systems or ureters. Stomach/Bowel: Stomach is within normal limits. No evidence of bowel wall thickening or dilatation. Colonic  diverticulosis. Appendix appears normal. Vascular/Lymphatic: No abdominal aorta or iliac aneurysm. Severe atherosclerotic plaque of the aorta and its branches. No abdominal, pelvic, or inguinal lymphadenopathy. Reproductive: Coarsely calcified masses likely represent degenerative uterine fibroids. Otherwise uterus and bilateral adnexa are unremarkable. Other: No intraperitoneal free fluid. No intraperitoneal free gas. No organized fluid collection. Musculoskeletal: No abdominal wall hernia or abnormality. No suspicious lytic or blastic osseous lesions. No acute displaced fracture. L3 chronic mild anterior wedge compression fracture with large Schmorl node along the superior endplate. Endplate sclerosis and intervertebral disc space vacuum phenomenon of the mid to lower lumbar spine. IMPRESSION: 1. No acute intrathoracic, intra-abdominal, intrapelvic abnormality. 2. Hepatic steatosis. 3. Nonobstructive bilateral punctate nephrolithiasis. 4. Colonic diverticulosis with no acute diverticulitis. 5. Degenerative uterine fibroids. 6. Aortic Atherosclerosis (ICD10-I70.0). Electronically Signed   By: MIven FinnM.D.   On: 05/15/2022 23:27   CT Head Wo Contrast  Result Date: 05/15/2022 CLINICAL DATA:  Mental status change, unknown cause EXAM: CT HEAD WITHOUT CONTRAST TECHNIQUE: Contiguous axial images were obtained from the base of the skull through the vertex without intravenous contrast. RADIATION DOSE REDUCTION: This exam was performed according to the departmental dose-optimization program which includes automated exposure control, adjustment of the mA and/or kV according to patient size and/or use  of iterative reconstruction technique. COMPARISON:  None Available. FINDINGS: Brain: Cerebral ventricle sizes are concordant with the degree of cerebral volume loss. No evidence of large-territorial acute infarction. No parenchymal hemorrhage. No mass lesion. No extra-axial collection. No mass effect or midline shift.  No hydrocephalus. Basilar cisterns are patent. Vascular: No hyperdense vessel. Skull: No acute fracture or focal lesion. Sinuses/Orbits: Paranasal sinuses and mastoid air cells are clear. Right lens replacement. Otherwise the orbits are unremarkable. Other: None. IMPRESSION: No acute intracranial abnormality. Electronically Signed   By: Iven Finn M.D.   On: 05/15/2022 23:11      Subjective: Patient seen and examined the bedside today.  She was on 4 to 5 L of oxygen per minute.  Looks very weak, deconditioned.  But not in respiratory distress.  Alert, awake, obeys commands but not oriented to time.  I called the daughter-in-law and son on phone and discussed about her condition.  They are agreeable for hospice set up at home.  Discharge Exam: Vitals:   06/10/22 0849 06/10/22 1417  BP: (!) 141/65   Pulse: (!) 101 89  Resp: 16 18  Temp: 98.2 F (36.8 C)   SpO2: 97% 97%   Vitals:   06/10/22 0441 06/10/22 0837 06/10/22 0849 06/10/22 1417  BP:   (!) 141/65   Pulse:  (!) 102 (!) 101 89  Resp:  '20 16 18  '$ Temp: 98.9 F (37.2 C)  98.2 F (36.8 C)   TempSrc: Axillary  Oral   SpO2:  95% 97% 97%  Weight:      Height:        General: Pt is alert, awake, not in acute distress,very weak ,deconditioned Cardiovascular: RRR, S1/S2 +, no rubs, no gallops Respiratory:diminished air sounds bilaterally Abdominal: Soft, NT, ND, bowel sounds + Extremities: no edema, no cyanosis    The results of significant diagnostics from this hospitalization (including imaging, microbiology, ancillary and laboratory) are listed below for reference.     Microbiology: Recent Results (from the past 240 hour(s))  Resp panel by RT-PCR (RSV, Flu A&B, Covid) Anterior Nasal Swab     Status: None   Collection Time: 06/09/22  7:40 PM   Specimen: Anterior Nasal Swab  Result Value Ref Range Status   SARS Coronavirus 2 by RT PCR NEGATIVE NEGATIVE Final   Influenza A by PCR NEGATIVE NEGATIVE Final   Influenza B  by PCR NEGATIVE NEGATIVE Final    Comment: (NOTE) The Xpert Xpress SARS-CoV-2/FLU/RSV plus assay is intended as an aid in the diagnosis of influenza from Nasopharyngeal swab specimens and should not be used as a sole basis for treatment. Nasal washings and aspirates are unacceptable for Xpert Xpress SARS-CoV-2/FLU/RSV testing.  Fact Sheet for Patients: EntrepreneurPulse.com.au  Fact Sheet for Healthcare Providers: IncredibleEmployment.be  This test is not yet approved or cleared by the [place] FDA and has been authorized for detection and/or diagnosis of SARS-CoV-2 by FDA under an Emergency Use Authorization (EUA). This EUA will remain in effect (meaning this test can be used) for the duration of the COVID-19 declaration under Section 564(b)(1) of the Act, 21 U.S.C. section 360bbb-3(b)(1), unless the authorization is terminated or revoked.     Resp Syncytial Virus by PCR NEGATIVE NEGATIVE Final    Comment: (NOTE) Fact Sheet for Patients: EntrepreneurPulse.com.au  Fact Sheet for Healthcare Providers: IncredibleEmployment.be  This test is not yet approved or cleared by the [place] FDA and has been authorized for detection and/or diagnosis of SARS-CoV-2 by FDA under an Emergency  Use Authorization (EUA). This EUA will remain in effect (meaning this test can be used) for the duration of the COVID-19 declaration under Section 564(b)(1) of the Act, 21 U.S.C. section 360bbb-3(b)(1), unless the authorization is terminated or revoked.  Performed at [facility], [place] [address] [place], [place] [address]      Labs: BNP (last 3 results) Recent Labs    05/15/22 2145 06/09/22 1952  BNP 72.6 [id number]   Basic Metabolic Panel: Recent Labs  Lab 06/09/22 1952 06/09/22 2035 06/10/22 0431  NA 140 141 144  K 3.7 3.8 4.0  CL 102  --  107  CO2 25  --  24  GLUCOSE 246*  --  379*  BUN 16  --  16   CREATININE 0.84  --  0.84  CALCIUM 9.6  --  9.1  MG 1.7  --   --   PHOS  --   --  3.3   Liver Function Tests: Recent Labs  Lab 06/09/22 1952 06/10/22 0431  AST 39 46*  ALT 48* 55*  ALKPHOS 67 63  BILITOT 0.6 0.5  PROT 6.9 7.0  ALBUMIN 3.5 3.4*   No results for input(s): "LIPASE", "AMYLASE" in the last 168 hours. No results for input(s): "AMMONIA" in the last 168 hours. CBC: Recent Labs  Lab 06/09/22 1952 06/09/22 2035 06/10/22 0431  WBC 9.5  --  9.0  NEUTROABS 5.9  --  8.0*  HGB 13.1 14.3 12.9  HCT 44.9 42.0 42.6  MCV 95.9  --  93.8  PLT 406*  --  420*   Cardiac Enzymes: No results for input(s): "CKTOTAL", "CKMB", "CKMBINDEX", "TROPONINI" in the last 168 hours. BNP: Invalid input(s): "POCBNP" CBG: Recent Labs  Lab 06/10/22 0849 06/10/22 1142  GLUCAP 388* 308*   D-Dimer No results for input(s): "DDIMER" in the last 72 hours. Hgb A1c No results for input(s): "HGBA1C" in the last 72 hours. Lipid Profile No results for input(s): "CHOL", "HDL", "LDLCALC", "TRIG", "CHOLHDL", "LDLDIRECT" in the last 72 hours. Thyroid function studies No results for input(s): "TSH", "T4TOTAL", "T3FREE", "THYROIDAB" in the last 72 hours.  Invalid input(s): "FREET3" Anemia work up No results for input(s): "VITAMINB12", "FOLATE", "FERRITIN", "TIBC", "IRON", "RETICCTPCT" in the last 72 hours. Urinalysis    Component Value Date/Time   COLORURINE YELLOW 05/15/2022 2144   APPEARANCEUR HAZY (A) 05/15/2022 2144   LABSPEC 1.020 05/15/2022 2144   PHURINE 6.0 05/15/2022 2144   GLUCOSEU >=500 (A) 05/15/2022 2144   HGBUR NEGATIVE 05/15/2022 2144   [place] NEGATIVE 05/15/2022 2144   KETONESUR 5 (A) 05/15/2022 2144   PROTEINUR 30 (A) 05/15/2022 2144   NITRITE POSITIVE (A) 05/15/2022 2144   LEUKOCYTESUR SMALL (A) 05/15/2022 2144   Sepsis Labs Recent Labs  Lab 06/09/22 1952 06/10/22 0431  WBC 9.5 9.0   Microbiology Recent Results (from the past 240 hour(s))  Resp panel by  RT-PCR (RSV, Flu A&B, Covid) Anterior Nasal Swab     Status: None   Collection Time: 06/09/22  7:40 PM   Specimen: Anterior Nasal Swab  Result Value Ref Range Status   SARS Coronavirus 2 by RT PCR NEGATIVE NEGATIVE Final   Influenza A by PCR NEGATIVE NEGATIVE Final   Influenza B by PCR NEGATIVE NEGATIVE Final    Comment: (NOTE) The Xpert Xpress SARS-CoV-2/FLU/RSV plus assay is intended as an aid in the diagnosis of influenza from Nasopharyngeal swab specimens and should not be used as a sole basis for treatment. Nasal washings and aspirates are unacceptable  for Xpert Xpress SARS-CoV-2/FLU/RSV testing.  Fact Sheet for Patients: EntrepreneurPulse.com.au  Fact Sheet for Healthcare Providers: IncredibleEmployment.be  This test is not yet approved or cleared by the [place] FDA and has been authorized for detection and/or diagnosis of SARS-CoV-2 by FDA under an Emergency Use Authorization (EUA). This EUA will remain in effect (meaning this test can be used) for the duration of the COVID-19 declaration under Section 564(b)(1) of the Act, 21 U.S.C. section 360bbb-3(b)(1), unless the authorization is terminated or revoked.     Resp Syncytial Virus by PCR NEGATIVE NEGATIVE Final    Comment: (NOTE) Fact Sheet for Patients: EntrepreneurPulse.com.au  Fact Sheet for Healthcare Providers: IncredibleEmployment.be  This test is not yet approved or cleared by the [place] FDA and has been authorized for detection and/or diagnosis of SARS-CoV-2 by FDA under an Emergency Use Authorization (EUA). This EUA will remain in effect (meaning this test can be used) for the duration of the COVID-19 declaration under Section 564(b)(1) of the Act, 21 U.S.C. section 360bbb-3(b)(1), unless the authorization is terminated or revoked.  Performed at [facility], [place] [address], [place], [place] [address]     Please  note: You were cared for by a hospitalist during your hospital stay. Once you are discharged, your primary care physician will handle any further medical issues. Please note that NO REFILLS for any discharge medications will be authorized once you are discharged, as it is imperative that you return to your primary care physician (or establish a relationship with a primary care physician if you do not have one) for your post hospital discharge needs so that they can reassess your need for medications and monitor your lab values.    Time coordinating discharge: 40 minutes  SIGNED:   Shelly Coss, MD  [facility] 06/10/2022, 3:02 PM Pager [phone]  If 7PM-7AM, please contact night-coverage [email or web address] Password [email or web address]

## 2022-06-11 DIAGNOSIS — E785 Hyperlipidemia, unspecified: Secondary | ICD-10-CM | POA: Diagnosis not present

## 2022-06-11 DIAGNOSIS — K219 Gastro-esophageal reflux disease without esophagitis: Secondary | ICD-10-CM | POA: Diagnosis not present

## 2022-06-11 DIAGNOSIS — J449 Chronic obstructive pulmonary disease, unspecified: Secondary | ICD-10-CM | POA: Diagnosis not present

## 2022-06-11 DIAGNOSIS — I10 Essential (primary) hypertension: Secondary | ICD-10-CM | POA: Diagnosis not present

## 2022-06-11 DIAGNOSIS — E119 Type 2 diabetes mellitus without complications: Secondary | ICD-10-CM | POA: Diagnosis not present

## 2022-06-11 DIAGNOSIS — K2270 Barrett's esophagus without dysplasia: Secondary | ICD-10-CM | POA: Diagnosis not present

## 2022-06-11 DIAGNOSIS — N390 Urinary tract infection, site not specified: Secondary | ICD-10-CM | POA: Diagnosis not present

## 2022-06-11 DIAGNOSIS — J9611 Chronic respiratory failure with hypoxia: Secondary | ICD-10-CM | POA: Diagnosis not present

## 2022-06-11 DIAGNOSIS — F0393 Unspecified dementia, unspecified severity, with mood disturbance: Secondary | ICD-10-CM | POA: Diagnosis not present

## 2022-06-12 DIAGNOSIS — E785 Hyperlipidemia, unspecified: Secondary | ICD-10-CM | POA: Diagnosis not present

## 2022-06-12 DIAGNOSIS — K219 Gastro-esophageal reflux disease without esophagitis: Secondary | ICD-10-CM | POA: Diagnosis not present

## 2022-06-12 DIAGNOSIS — J9611 Chronic respiratory failure with hypoxia: Secondary | ICD-10-CM | POA: Diagnosis not present

## 2022-06-12 DIAGNOSIS — I10 Essential (primary) hypertension: Secondary | ICD-10-CM | POA: Diagnosis not present

## 2022-06-12 DIAGNOSIS — N390 Urinary tract infection, site not specified: Secondary | ICD-10-CM | POA: Diagnosis not present

## 2022-06-12 DIAGNOSIS — E119 Type 2 diabetes mellitus without complications: Secondary | ICD-10-CM | POA: Diagnosis not present

## 2022-06-12 DIAGNOSIS — J449 Chronic obstructive pulmonary disease, unspecified: Secondary | ICD-10-CM | POA: Diagnosis not present

## 2022-06-12 DIAGNOSIS — K2270 Barrett's esophagus without dysplasia: Secondary | ICD-10-CM | POA: Diagnosis not present

## 2022-06-12 DIAGNOSIS — F0393 Unspecified dementia, unspecified severity, with mood disturbance: Secondary | ICD-10-CM | POA: Diagnosis not present

## 2022-06-16 ENCOUNTER — Telehealth: Payer: Self-pay | Admitting: Pharmacist

## 2022-06-16 ENCOUNTER — Telehealth: Payer: Self-pay

## 2022-06-16 DIAGNOSIS — J449 Chronic obstructive pulmonary disease, unspecified: Secondary | ICD-10-CM | POA: Diagnosis not present

## 2022-06-16 DIAGNOSIS — F0393 Unspecified dementia, unspecified severity, with mood disturbance: Secondary | ICD-10-CM | POA: Diagnosis not present

## 2022-06-16 DIAGNOSIS — N390 Urinary tract infection, site not specified: Secondary | ICD-10-CM | POA: Diagnosis not present

## 2022-06-16 DIAGNOSIS — E119 Type 2 diabetes mellitus without complications: Secondary | ICD-10-CM | POA: Diagnosis not present

## 2022-06-16 DIAGNOSIS — J9611 Chronic respiratory failure with hypoxia: Secondary | ICD-10-CM | POA: Diagnosis not present

## 2022-06-16 DIAGNOSIS — I10 Essential (primary) hypertension: Secondary | ICD-10-CM | POA: Diagnosis not present

## 2022-06-16 NOTE — Progress Notes (Signed)
Care Management & Coordination Services Pharmacy Team  Reason for Encounter: Medication coordination and delivery  Contacted patient to discuss medications and coordinate delivery from [facility]. Spoke with family on 06/16/2022  Cycle dispensing form sent to Leata Mouse for review.   Last adherence delivery date: 04/28/2022      Patient is due for next adherence delivery on: 06/26/2022  This delivery to include: Adherence Packaging  30 Days  Acetazolamide 250 mg one tablet at breakfast Glipizide ER 5 mg one tablet at breakfast Metformin 100 mg one at breakfast and one at evening meal Escitalopram 10 mg one at breakfast Losartan Potassium 50 mg one at breakfast Omeprazole 20 mg one at breakfast, one at evening meal Hydrocodone-APAP 5-325 mg four times daily prn  Patient declined the following medications this month: Rosuvastatin, Trelegy, Januvia - patient no longer taking  Refills requested from providers include: Hydrocodone-APAP 5-325 mg four times daily prn   Confirmed delivery date of 06/26/2022, advised patient that pharmacy will contact them the morning of delivery.   Any concerns about your medications? No  How often do you forget or accidentally miss a dose? Never  Do you use a pillbox? No  Is patient in packaging Yes  If yes  Any concerns or issues with your packaging? No    Chart review: Recent office visits:  None since last medication coordination call  Recent consult visits:  None since last medication coordination call  Hospital visits:  06/09/2022 ED to Hospital Admission due to COPD exacerbation Admit date: 06/09/2022 Discharge date: 06/10/2022 Started on bronchodilators, steroids continue incentive spirometer.  Also started on azithromycin.  Currently on baseline oxygen requirement.  Continue to prednisone on tapering dose    05/15/2022 ED to Hospital Admission due to Acute cystitis without hematuria Admit date: 05/15/2022 Discharge date:  05/19/2022    Medications: Outpatient Encounter Medications as of 06/16/2022  Medication Sig   acetaZOLAMIDE (DIAMOX) 250 MG tablet Take 1 tablet (250 mg total) by mouth every morning.   albuterol (PROAIR HFA) 108 (90 Base) MCG/ACT inhaler Inhale 2 puffs into the lungs every 6 (six) hours as needed for wheezing or shortness of breath.   albuterol (PROVENTIL) (2.5 MG/3ML) 0.083% nebulizer solution INHALE THE CONTENTS OF 1 VIAL VIA NEBULIZATION EVERY 6 HOURS AS NEEDED FOR [facility] AND SHORTNESS OF BREATH (Patient taking differently: Take 2.5 mg by nebulization every 6 (six) hours as needed for wheezing or shortness of breath.)   aspirin 81 MG tablet Take 81 mg by mouth daily.   Cholecalciferol (VITAMIN D3) 250 MCG (10000 UT) capsule Take 1 capsule (10,000 Units total) by mouth daily.   ELDERBERRY PO Take 1 each by mouth daily.   escitalopram (LEXAPRO) 10 MG tablet Take 1 tablet (10 mg total) by mouth daily.   Fluticasone-Umeclidin-Vilant (TRELEGY ELLIPTA) 100-62.5-25 MCG/ACT AEPB Inhale 1 Dose into the lungs daily. (Patient taking differently: Inhale 1 Dose into the lungs every evening.)   glipiZIDE (GLUCOTROL XL) 5 MG 24 hr tablet Take 1 tablet (5 mg total) by mouth every morning.   guaiFENesin (MUCINEX) 600 MG 12 hr tablet Take 1 tablet (600 mg total) by mouth 2 (two) times daily for 7 days.   HYDROcodone-acetaminophen (NORCO/VICODIN) 5-325 MG tablet TAKE ONE TABLET BY MOUTH eveyr SIX hours AS NEEDED FOR moderate pain   losartan (COZAAR) 50 MG tablet Take 1 tablet (50 mg total) by mouth daily.   metFORMIN (GLUCOPHAGE) 1000 MG tablet TAKE ONE TABLET BY MOUTH TWICE DAILY WITH FOOD (Patient taking  differently: Take 1,000 mg by mouth 2 (two) times daily with a meal.)   Multiple Vitamins-Minerals (CENTRUM SILVER 50+WOMEN) TABS Take 1 tablet by mouth daily.   nicotine (NICODERM CQ - DOSED IN MG/24 HOURS) 21 mg/24hr patch Place 1 patch (21 mg total) onto the skin daily as needed (smoking cessation).    omeprazole (PRILOSEC) 20 MG capsule TAKE 1 CAPSULE BY MOUTH TWICE A DAY BEFORE A MEAL (Patient taking differently: Take 20 mg by mouth 2 (two) times daily before a meal.)   predniSONE (DELTASONE) 10 MG tablet Take 1 tablet (10 mg total) by mouth daily. Take 4 pills daily for 3 days then 3 pills daily for 3 days then 2 pills daily for 3 days then 1 pill daily for 3 days then stop   rosuvastatin (CRESTOR) 20 MG tablet Take 1 tablet (20 mg total) by mouth every evening.   sitaGLIPtin (JANUVIA) 100 MG tablet Take 1 tablet (100 mg total) by mouth daily.   No facility-administered encounter medications on file as of 06/16/2022.   BP Readings from Last 3 Encounters:  06/10/22 (!) 147/77  05/19/22 119/61  01/22/22 120/72    Pulse Readings from Last 3 Encounters:  06/10/22 87  05/19/22 82  01/22/22 81    Lab Results  Component Value Date/Time   HGBA1C 7.4 (H) 01/22/2022 02:23 PM   HGBA1C 7.7 (H) 12/09/2020 11:52 AM   Lab Results  Component Value Date   CREATININE 0.84 06/10/2022   BUN 16 06/10/2022   GFRNONAA >60 06/10/2022   GFRAA 101 12/01/2019   NA 144 06/10/2022   K 4.0 06/10/2022   CALCIUM 9.1 06/10/2022   CO2 24 06/10/2022     Future Appointments  Date Time Provider [place]  06/18/2022  4:00 PM [name], MD [facility]   April D Calhoun, [place] Pharmacist Assistant [phone]

## 2022-06-16 NOTE — Telephone Encounter (Signed)
Pt's daughter came in to have ppw completed for pt. Pt's daughter completed an intake form for ppw. Ppw and intake form placed in nurse's folder. Please call pt's daughter when available to pick up.  Cb#: [phone]

## 2022-06-18 ENCOUNTER — Ambulatory Visit: Payer: Medicare Other | Admitting: Family Medicine

## 2022-06-19 ENCOUNTER — Other Ambulatory Visit: Payer: Self-pay | Admitting: Family Medicine

## 2022-06-19 DIAGNOSIS — N390 Urinary tract infection, site not specified: Secondary | ICD-10-CM | POA: Diagnosis not present

## 2022-06-19 DIAGNOSIS — J449 Chronic obstructive pulmonary disease, unspecified: Secondary | ICD-10-CM | POA: Diagnosis not present

## 2022-06-19 DIAGNOSIS — F0393 Unspecified dementia, unspecified severity, with mood disturbance: Secondary | ICD-10-CM | POA: Diagnosis not present

## 2022-06-19 DIAGNOSIS — I10 Essential (primary) hypertension: Secondary | ICD-10-CM | POA: Diagnosis not present

## 2022-06-19 DIAGNOSIS — J9611 Chronic respiratory failure with hypoxia: Secondary | ICD-10-CM | POA: Diagnosis not present

## 2022-06-19 DIAGNOSIS — E119 Type 2 diabetes mellitus without complications: Secondary | ICD-10-CM | POA: Diagnosis not present

## 2022-06-22 NOTE — Telephone Encounter (Signed)
Pt daughter, [name], called to check status of form. Daughter informed form is on PCP desk for completion.

## 2022-06-23 DIAGNOSIS — N390 Urinary tract infection, site not specified: Secondary | ICD-10-CM | POA: Diagnosis not present

## 2022-06-23 DIAGNOSIS — J449 Chronic obstructive pulmonary disease, unspecified: Secondary | ICD-10-CM | POA: Diagnosis not present

## 2022-06-23 DIAGNOSIS — F0393 Unspecified dementia, unspecified severity, with mood disturbance: Secondary | ICD-10-CM | POA: Diagnosis not present

## 2022-06-23 DIAGNOSIS — I10 Essential (primary) hypertension: Secondary | ICD-10-CM | POA: Diagnosis not present

## 2022-06-23 DIAGNOSIS — J9611 Chronic respiratory failure with hypoxia: Secondary | ICD-10-CM | POA: Diagnosis not present

## 2022-06-23 DIAGNOSIS — E119 Type 2 diabetes mellitus without complications: Secondary | ICD-10-CM | POA: Diagnosis not present

## 2022-06-24 DIAGNOSIS — J9611 Chronic respiratory failure with hypoxia: Secondary | ICD-10-CM | POA: Diagnosis not present

## 2022-06-24 DIAGNOSIS — E119 Type 2 diabetes mellitus without complications: Secondary | ICD-10-CM | POA: Diagnosis not present

## 2022-06-24 DIAGNOSIS — F0393 Unspecified dementia, unspecified severity, with mood disturbance: Secondary | ICD-10-CM | POA: Diagnosis not present

## 2022-06-24 DIAGNOSIS — I10 Essential (primary) hypertension: Secondary | ICD-10-CM | POA: Diagnosis not present

## 2022-06-24 DIAGNOSIS — N390 Urinary tract infection, site not specified: Secondary | ICD-10-CM | POA: Diagnosis not present

## 2022-06-24 DIAGNOSIS — J449 Chronic obstructive pulmonary disease, unspecified: Secondary | ICD-10-CM | POA: Diagnosis not present

## 2022-06-25 DIAGNOSIS — F0393 Unspecified dementia, unspecified severity, with mood disturbance: Secondary | ICD-10-CM | POA: Diagnosis not present

## 2022-06-25 DIAGNOSIS — I10 Essential (primary) hypertension: Secondary | ICD-10-CM | POA: Diagnosis not present

## 2022-06-25 DIAGNOSIS — E119 Type 2 diabetes mellitus without complications: Secondary | ICD-10-CM | POA: Diagnosis not present

## 2022-06-25 DIAGNOSIS — J9611 Chronic respiratory failure with hypoxia: Secondary | ICD-10-CM | POA: Diagnosis not present

## 2022-06-25 DIAGNOSIS — N390 Urinary tract infection, site not specified: Secondary | ICD-10-CM | POA: Diagnosis not present

## 2022-06-25 DIAGNOSIS — J449 Chronic obstructive pulmonary disease, unspecified: Secondary | ICD-10-CM | POA: Diagnosis not present

## 2022-06-30 DIAGNOSIS — J9611 Chronic respiratory failure with hypoxia: Secondary | ICD-10-CM | POA: Diagnosis not present

## 2022-06-30 DIAGNOSIS — F0393 Unspecified dementia, unspecified severity, with mood disturbance: Secondary | ICD-10-CM | POA: Diagnosis not present

## 2022-06-30 DIAGNOSIS — E119 Type 2 diabetes mellitus without complications: Secondary | ICD-10-CM | POA: Diagnosis not present

## 2022-06-30 DIAGNOSIS — J449 Chronic obstructive pulmonary disease, unspecified: Secondary | ICD-10-CM | POA: Diagnosis not present

## 2022-06-30 DIAGNOSIS — I10 Essential (primary) hypertension: Secondary | ICD-10-CM | POA: Diagnosis not present

## 2022-06-30 DIAGNOSIS — N390 Urinary tract infection, site not specified: Secondary | ICD-10-CM | POA: Diagnosis not present

## 2022-07-01 DIAGNOSIS — E119 Type 2 diabetes mellitus without complications: Secondary | ICD-10-CM | POA: Diagnosis not present

## 2022-07-01 DIAGNOSIS — I10 Essential (primary) hypertension: Secondary | ICD-10-CM | POA: Diagnosis not present

## 2022-07-01 DIAGNOSIS — J449 Chronic obstructive pulmonary disease, unspecified: Secondary | ICD-10-CM | POA: Diagnosis not present

## 2022-07-01 DIAGNOSIS — N390 Urinary tract infection, site not specified: Secondary | ICD-10-CM | POA: Diagnosis not present

## 2022-07-01 DIAGNOSIS — J9611 Chronic respiratory failure with hypoxia: Secondary | ICD-10-CM | POA: Diagnosis not present

## 2022-07-01 DIAGNOSIS — F0393 Unspecified dementia, unspecified severity, with mood disturbance: Secondary | ICD-10-CM | POA: Diagnosis not present

## 2022-07-03 DIAGNOSIS — J449 Chronic obstructive pulmonary disease, unspecified: Secondary | ICD-10-CM | POA: Diagnosis not present

## 2022-07-03 DIAGNOSIS — F0393 Unspecified dementia, unspecified severity, with mood disturbance: Secondary | ICD-10-CM | POA: Diagnosis not present

## 2022-07-03 DIAGNOSIS — I10 Essential (primary) hypertension: Secondary | ICD-10-CM | POA: Diagnosis not present

## 2022-07-03 DIAGNOSIS — J9611 Chronic respiratory failure with hypoxia: Secondary | ICD-10-CM | POA: Diagnosis not present

## 2022-07-03 DIAGNOSIS — E119 Type 2 diabetes mellitus without complications: Secondary | ICD-10-CM | POA: Diagnosis not present

## 2022-07-03 DIAGNOSIS — N390 Urinary tract infection, site not specified: Secondary | ICD-10-CM | POA: Diagnosis not present

## 2022-07-07 DIAGNOSIS — N390 Urinary tract infection, site not specified: Secondary | ICD-10-CM | POA: Diagnosis not present

## 2022-07-07 DIAGNOSIS — F0393 Unspecified dementia, unspecified severity, with mood disturbance: Secondary | ICD-10-CM | POA: Diagnosis not present

## 2022-07-07 DIAGNOSIS — E119 Type 2 diabetes mellitus without complications: Secondary | ICD-10-CM | POA: Diagnosis not present

## 2022-07-07 DIAGNOSIS — I10 Essential (primary) hypertension: Secondary | ICD-10-CM | POA: Diagnosis not present

## 2022-07-07 DIAGNOSIS — J449 Chronic obstructive pulmonary disease, unspecified: Secondary | ICD-10-CM | POA: Diagnosis not present

## 2022-07-07 DIAGNOSIS — J9611 Chronic respiratory failure with hypoxia: Secondary | ICD-10-CM | POA: Diagnosis not present

## 2022-07-08 DIAGNOSIS — J9611 Chronic respiratory failure with hypoxia: Secondary | ICD-10-CM | POA: Diagnosis not present

## 2022-07-08 DIAGNOSIS — F0393 Unspecified dementia, unspecified severity, with mood disturbance: Secondary | ICD-10-CM | POA: Diagnosis not present

## 2022-07-08 DIAGNOSIS — N390 Urinary tract infection, site not specified: Secondary | ICD-10-CM | POA: Diagnosis not present

## 2022-07-08 DIAGNOSIS — I10 Essential (primary) hypertension: Secondary | ICD-10-CM | POA: Diagnosis not present

## 2022-07-08 DIAGNOSIS — J449 Chronic obstructive pulmonary disease, unspecified: Secondary | ICD-10-CM | POA: Diagnosis not present

## 2022-07-08 DIAGNOSIS — E119 Type 2 diabetes mellitus without complications: Secondary | ICD-10-CM | POA: Diagnosis not present

## 2022-07-10 DIAGNOSIS — E119 Type 2 diabetes mellitus without complications: Secondary | ICD-10-CM | POA: Diagnosis not present

## 2022-07-10 DIAGNOSIS — F0393 Unspecified dementia, unspecified severity, with mood disturbance: Secondary | ICD-10-CM | POA: Diagnosis not present

## 2022-07-10 DIAGNOSIS — J9611 Chronic respiratory failure with hypoxia: Secondary | ICD-10-CM | POA: Diagnosis not present

## 2022-07-10 DIAGNOSIS — J449 Chronic obstructive pulmonary disease, unspecified: Secondary | ICD-10-CM | POA: Diagnosis not present

## 2022-07-10 DIAGNOSIS — I10 Essential (primary) hypertension: Secondary | ICD-10-CM | POA: Diagnosis not present

## 2022-07-10 DIAGNOSIS — N390 Urinary tract infection, site not specified: Secondary | ICD-10-CM | POA: Diagnosis not present

## 2022-07-13 DIAGNOSIS — J449 Chronic obstructive pulmonary disease, unspecified: Secondary | ICD-10-CM | POA: Diagnosis not present

## 2022-07-13 DIAGNOSIS — E119 Type 2 diabetes mellitus without complications: Secondary | ICD-10-CM | POA: Diagnosis not present

## 2022-07-13 DIAGNOSIS — N390 Urinary tract infection, site not specified: Secondary | ICD-10-CM | POA: Diagnosis not present

## 2022-07-13 DIAGNOSIS — K2270 Barrett's esophagus without dysplasia: Secondary | ICD-10-CM | POA: Diagnosis not present

## 2022-07-13 DIAGNOSIS — E785 Hyperlipidemia, unspecified: Secondary | ICD-10-CM | POA: Diagnosis not present

## 2022-07-13 DIAGNOSIS — J9611 Chronic respiratory failure with hypoxia: Secondary | ICD-10-CM | POA: Diagnosis not present

## 2022-07-13 DIAGNOSIS — F0393 Unspecified dementia, unspecified severity, with mood disturbance: Secondary | ICD-10-CM | POA: Diagnosis not present

## 2022-07-13 DIAGNOSIS — I10 Essential (primary) hypertension: Secondary | ICD-10-CM | POA: Diagnosis not present

## 2022-07-13 DIAGNOSIS — K219 Gastro-esophageal reflux disease without esophagitis: Secondary | ICD-10-CM | POA: Diagnosis not present

## 2022-07-14 DIAGNOSIS — E119 Type 2 diabetes mellitus without complications: Secondary | ICD-10-CM | POA: Diagnosis not present

## 2022-07-14 DIAGNOSIS — F0393 Unspecified dementia, unspecified severity, with mood disturbance: Secondary | ICD-10-CM | POA: Diagnosis not present

## 2022-07-14 DIAGNOSIS — I10 Essential (primary) hypertension: Secondary | ICD-10-CM | POA: Diagnosis not present

## 2022-07-14 DIAGNOSIS — J9611 Chronic respiratory failure with hypoxia: Secondary | ICD-10-CM | POA: Diagnosis not present

## 2022-07-14 DIAGNOSIS — N390 Urinary tract infection, site not specified: Secondary | ICD-10-CM | POA: Diagnosis not present

## 2022-07-14 DIAGNOSIS — J449 Chronic obstructive pulmonary disease, unspecified: Secondary | ICD-10-CM | POA: Diagnosis not present

## 2022-07-15 ENCOUNTER — Telehealth: Payer: Self-pay | Admitting: Pharmacist

## 2022-07-15 DIAGNOSIS — F0393 Unspecified dementia, unspecified severity, with mood disturbance: Secondary | ICD-10-CM | POA: Diagnosis not present

## 2022-07-15 DIAGNOSIS — I10 Essential (primary) hypertension: Secondary | ICD-10-CM | POA: Diagnosis not present

## 2022-07-15 DIAGNOSIS — J449 Chronic obstructive pulmonary disease, unspecified: Secondary | ICD-10-CM | POA: Diagnosis not present

## 2022-07-15 DIAGNOSIS — J9611 Chronic respiratory failure with hypoxia: Secondary | ICD-10-CM | POA: Diagnosis not present

## 2022-07-15 DIAGNOSIS — E119 Type 2 diabetes mellitus without complications: Secondary | ICD-10-CM | POA: Diagnosis not present

## 2022-07-15 DIAGNOSIS — N390 Urinary tract infection, site not specified: Secondary | ICD-10-CM | POA: Diagnosis not present

## 2022-07-15 NOTE — Progress Notes (Signed)
Care Management & Coordination Services Pharmacy Team  Reason for Encounter: Medication coordination and delivery  Contacted patient to discuss medications and coordinate delivery from [facility]. Spoke with family on 07/15/2022  Cycle dispensing form sent to Leata Mouse, CPP for review.   Last adherence delivery date: 06/26/22      Patient is due for next adherence delivery on: 07/27/22  This delivery to include: Adherence Packaging  30 Days   Glipizide ER 5 mg one tablet at breakfast Metformin 100 mg one at breakfast and one at evening meal Escitalopram 10 mg one at breakfast Losartan Potassium 50 mg one at breakfast Omeprazole 20 mg one at breakfast, one at evening meal Hydrocodone-APAP 5-325 mg four times daily prn   Patient declined the following medications this month: None   Refills requested from providers include: (PCP) Hydrocodone-APAP 5-325 mg four times daily prn Escitalopram 10 mg one at breakfast  Confirmed delivery date of 07/27/22, advised patient that pharmacy will contact them the morning of delivery.   Any concerns about your medications? No  How often do you forget or accidentally miss a dose? Never  Do you use a pillbox? No  Is patient in packaging Yes  If yes  What is the date on your next pill pack?  Any concerns or issues with your packaging?    Medications: Outpatient Encounter Medications as of 07/15/2022  Medication Sig   acetaZOLAMIDE (DIAMOX) 250 MG tablet Take 1 tablet (250 mg total) by mouth every morning.   albuterol (PROAIR HFA) 108 (90 Base) MCG/ACT inhaler Inhale 2 puffs into the lungs every 6 (six) hours as needed for wheezing or shortness of breath.   albuterol (PROVENTIL) (2.5 MG/3ML) 0.083% nebulizer solution INHALE THE CONTENTS OF 1 VIAL VIA NEBULIZATION EVERY 6 HOURS AS NEEDED FOR [facility] AND SHORTNESS OF BREATH (Patient taking differently: Take 2.5 mg by nebulization every 6 (six) hours as needed for wheezing or shortness  of breath.)   aspirin 81 MG tablet Take 81 mg by mouth daily.   Cholecalciferol (VITAMIN D3) 250 MCG (10000 UT) capsule Take 1 capsule (10,000 Units total) by mouth daily.   ELDERBERRY PO Take 1 each by mouth daily.   escitalopram (LEXAPRO) 10 MG tablet Take 1 tablet (10 mg total) by mouth daily.   Fluticasone-Umeclidin-Vilant (TRELEGY ELLIPTA) 100-62.5-25 MCG/ACT AEPB Inhale 1 Dose into the lungs daily. (Patient taking differently: Inhale 1 Dose into the lungs every evening.)   glipiZIDE (GLUCOTROL XL) 5 MG 24 hr tablet Take 1 tablet (5 mg total) by mouth every morning.   HYDROcodone-acetaminophen (NORCO/VICODIN) 5-325 MG tablet TAKE ONE TABLET BY MOUTH every SIX hours AS NEEDED FOR moderate pain   losartan (COZAAR) 50 MG tablet Take 1 tablet (50 mg total) by mouth daily.   metFORMIN (GLUCOPHAGE) 1000 MG tablet TAKE ONE TABLET BY MOUTH TWICE DAILY WITH FOOD (Patient taking differently: Take 1,000 mg by mouth 2 (two) times daily with a meal.)   Multiple Vitamins-Minerals (CENTRUM SILVER 50+WOMEN) TABS Take 1 tablet by mouth daily.   nicotine (NICODERM CQ - DOSED IN MG/24 HOURS) 21 mg/24hr patch Place 1 patch (21 mg total) onto the skin daily as needed (smoking cessation).   omeprazole (PRILOSEC) 20 MG capsule TAKE 1 CAPSULE BY MOUTH TWICE A DAY BEFORE A MEAL (Patient taking differently: Take 20 mg by mouth 2 (two) times daily before a meal.)   predniSONE (DELTASONE) 10 MG tablet Take 1 tablet (10 mg total) by mouth daily. Take 4 pills daily for 3  days then 3 pills daily for 3 days then 2 pills daily for 3 days then 1 pill daily for 3 days then stop   rosuvastatin (CRESTOR) 20 MG tablet Take 1 tablet (20 mg total) by mouth every evening.   sitaGLIPtin (JANUVIA) 100 MG tablet Take 1 tablet (100 mg total) by mouth daily.   No facility-administered encounter medications on file as of 07/15/2022.   BP Readings from Last 3 Encounters:  06/10/22 (!) 147/77  05/19/22 119/61  01/22/22 120/72    Pulse  Readings from Last 3 Encounters:  06/10/22 87  05/19/22 82  01/22/22 81    Lab Results  Component Value Date/Time   HGBA1C 7.4 (H) 01/22/2022 02:23 PM   HGBA1C 7.7 (H) 12/09/2020 11:52 AM   Lab Results  Component Value Date   CREATININE 0.84 06/10/2022   BUN 16 06/10/2022   GFRNONAA >60 06/10/2022   GFRAA 101 12/01/2019   NA 144 06/10/2022   K 4.0 06/10/2022   CALCIUM 9.1 06/10/2022   CO2 24 06/10/2022    No future appointments.   [facility], [facility]

## 2022-07-17 DIAGNOSIS — J9611 Chronic respiratory failure with hypoxia: Secondary | ICD-10-CM | POA: Diagnosis not present

## 2022-07-17 DIAGNOSIS — J449 Chronic obstructive pulmonary disease, unspecified: Secondary | ICD-10-CM | POA: Diagnosis not present

## 2022-07-17 DIAGNOSIS — N390 Urinary tract infection, site not specified: Secondary | ICD-10-CM | POA: Diagnosis not present

## 2022-07-17 DIAGNOSIS — I10 Essential (primary) hypertension: Secondary | ICD-10-CM | POA: Diagnosis not present

## 2022-07-17 DIAGNOSIS — F0393 Unspecified dementia, unspecified severity, with mood disturbance: Secondary | ICD-10-CM | POA: Diagnosis not present

## 2022-07-17 DIAGNOSIS — E119 Type 2 diabetes mellitus without complications: Secondary | ICD-10-CM | POA: Diagnosis not present

## 2022-07-17 NOTE — Telephone Encounter (Signed)
Erroneous encounter. Please disregard.

## 2022-07-21 ENCOUNTER — Other Ambulatory Visit: Payer: Self-pay | Admitting: Family Medicine

## 2022-07-21 DIAGNOSIS — F0393 Unspecified dementia, unspecified severity, with mood disturbance: Secondary | ICD-10-CM | POA: Diagnosis not present

## 2022-07-21 DIAGNOSIS — E119 Type 2 diabetes mellitus without complications: Secondary | ICD-10-CM | POA: Diagnosis not present

## 2022-07-21 DIAGNOSIS — J449 Chronic obstructive pulmonary disease, unspecified: Secondary | ICD-10-CM | POA: Diagnosis not present

## 2022-07-21 DIAGNOSIS — J9611 Chronic respiratory failure with hypoxia: Secondary | ICD-10-CM | POA: Diagnosis not present

## 2022-07-21 DIAGNOSIS — I10 Essential (primary) hypertension: Secondary | ICD-10-CM | POA: Diagnosis not present

## 2022-07-21 DIAGNOSIS — N390 Urinary tract infection, site not specified: Secondary | ICD-10-CM | POA: Diagnosis not present

## 2022-07-22 DIAGNOSIS — F0393 Unspecified dementia, unspecified severity, with mood disturbance: Secondary | ICD-10-CM | POA: Diagnosis not present

## 2022-07-22 DIAGNOSIS — I10 Essential (primary) hypertension: Secondary | ICD-10-CM | POA: Diagnosis not present

## 2022-07-22 DIAGNOSIS — N390 Urinary tract infection, site not specified: Secondary | ICD-10-CM | POA: Diagnosis not present

## 2022-07-22 DIAGNOSIS — J9611 Chronic respiratory failure with hypoxia: Secondary | ICD-10-CM | POA: Diagnosis not present

## 2022-07-22 DIAGNOSIS — E119 Type 2 diabetes mellitus without complications: Secondary | ICD-10-CM | POA: Diagnosis not present

## 2022-07-22 DIAGNOSIS — J449 Chronic obstructive pulmonary disease, unspecified: Secondary | ICD-10-CM | POA: Diagnosis not present

## 2022-07-23 DIAGNOSIS — I10 Essential (primary) hypertension: Secondary | ICD-10-CM | POA: Diagnosis not present

## 2022-07-23 DIAGNOSIS — J9611 Chronic respiratory failure with hypoxia: Secondary | ICD-10-CM | POA: Diagnosis not present

## 2022-07-23 DIAGNOSIS — E119 Type 2 diabetes mellitus without complications: Secondary | ICD-10-CM | POA: Diagnosis not present

## 2022-07-23 DIAGNOSIS — J449 Chronic obstructive pulmonary disease, unspecified: Secondary | ICD-10-CM | POA: Diagnosis not present

## 2022-07-23 DIAGNOSIS — F0393 Unspecified dementia, unspecified severity, with mood disturbance: Secondary | ICD-10-CM | POA: Diagnosis not present

## 2022-07-23 DIAGNOSIS — N390 Urinary tract infection, site not specified: Secondary | ICD-10-CM | POA: Diagnosis not present

## 2022-07-24 DIAGNOSIS — I10 Essential (primary) hypertension: Secondary | ICD-10-CM | POA: Diagnosis not present

## 2022-07-24 DIAGNOSIS — J9611 Chronic respiratory failure with hypoxia: Secondary | ICD-10-CM | POA: Diagnosis not present

## 2022-07-24 DIAGNOSIS — F0393 Unspecified dementia, unspecified severity, with mood disturbance: Secondary | ICD-10-CM | POA: Diagnosis not present

## 2022-07-24 DIAGNOSIS — N390 Urinary tract infection, site not specified: Secondary | ICD-10-CM | POA: Diagnosis not present

## 2022-07-24 DIAGNOSIS — E119 Type 2 diabetes mellitus without complications: Secondary | ICD-10-CM | POA: Diagnosis not present

## 2022-07-24 DIAGNOSIS — J449 Chronic obstructive pulmonary disease, unspecified: Secondary | ICD-10-CM | POA: Diagnosis not present

## 2022-07-25 DIAGNOSIS — J9611 Chronic respiratory failure with hypoxia: Secondary | ICD-10-CM | POA: Diagnosis not present

## 2022-07-25 DIAGNOSIS — E119 Type 2 diabetes mellitus without complications: Secondary | ICD-10-CM | POA: Diagnosis not present

## 2022-07-25 DIAGNOSIS — I10 Essential (primary) hypertension: Secondary | ICD-10-CM | POA: Diagnosis not present

## 2022-07-25 DIAGNOSIS — N390 Urinary tract infection, site not specified: Secondary | ICD-10-CM | POA: Diagnosis not present

## 2022-07-25 DIAGNOSIS — F0393 Unspecified dementia, unspecified severity, with mood disturbance: Secondary | ICD-10-CM | POA: Diagnosis not present

## 2022-07-25 DIAGNOSIS — J449 Chronic obstructive pulmonary disease, unspecified: Secondary | ICD-10-CM | POA: Diagnosis not present

## 2022-07-26 DIAGNOSIS — E119 Type 2 diabetes mellitus without complications: Secondary | ICD-10-CM | POA: Diagnosis not present

## 2022-07-26 DIAGNOSIS — F0393 Unspecified dementia, unspecified severity, with mood disturbance: Secondary | ICD-10-CM | POA: Diagnosis not present

## 2022-07-26 DIAGNOSIS — J449 Chronic obstructive pulmonary disease, unspecified: Secondary | ICD-10-CM | POA: Diagnosis not present

## 2022-07-26 DIAGNOSIS — J9611 Chronic respiratory failure with hypoxia: Secondary | ICD-10-CM | POA: Diagnosis not present

## 2022-07-26 DIAGNOSIS — N390 Urinary tract infection, site not specified: Secondary | ICD-10-CM | POA: Diagnosis not present

## 2022-07-26 DIAGNOSIS — I10 Essential (primary) hypertension: Secondary | ICD-10-CM | POA: Diagnosis not present

## 2022-07-27 DIAGNOSIS — N390 Urinary tract infection, site not specified: Secondary | ICD-10-CM | POA: Diagnosis not present

## 2022-07-27 DIAGNOSIS — E119 Type 2 diabetes mellitus without complications: Secondary | ICD-10-CM | POA: Diagnosis not present

## 2022-07-27 DIAGNOSIS — J9611 Chronic respiratory failure with hypoxia: Secondary | ICD-10-CM | POA: Diagnosis not present

## 2022-07-27 DIAGNOSIS — F0393 Unspecified dementia, unspecified severity, with mood disturbance: Secondary | ICD-10-CM | POA: Diagnosis not present

## 2022-07-27 DIAGNOSIS — I10 Essential (primary) hypertension: Secondary | ICD-10-CM | POA: Diagnosis not present

## 2022-07-27 DIAGNOSIS — J449 Chronic obstructive pulmonary disease, unspecified: Secondary | ICD-10-CM | POA: Diagnosis not present

## 2022-07-28 DIAGNOSIS — J9611 Chronic respiratory failure with hypoxia: Secondary | ICD-10-CM | POA: Diagnosis not present

## 2022-07-28 DIAGNOSIS — J449 Chronic obstructive pulmonary disease, unspecified: Secondary | ICD-10-CM | POA: Diagnosis not present

## 2022-07-28 DIAGNOSIS — E119 Type 2 diabetes mellitus without complications: Secondary | ICD-10-CM | POA: Diagnosis not present

## 2022-07-28 DIAGNOSIS — F0393 Unspecified dementia, unspecified severity, with mood disturbance: Secondary | ICD-10-CM | POA: Diagnosis not present

## 2022-07-28 DIAGNOSIS — N390 Urinary tract infection, site not specified: Secondary | ICD-10-CM | POA: Diagnosis not present

## 2022-07-28 DIAGNOSIS — I10 Essential (primary) hypertension: Secondary | ICD-10-CM | POA: Diagnosis not present

## 2022-07-29 DIAGNOSIS — I10 Essential (primary) hypertension: Secondary | ICD-10-CM | POA: Diagnosis not present

## 2022-07-29 DIAGNOSIS — F0393 Unspecified dementia, unspecified severity, with mood disturbance: Secondary | ICD-10-CM | POA: Diagnosis not present

## 2022-07-29 DIAGNOSIS — E119 Type 2 diabetes mellitus without complications: Secondary | ICD-10-CM | POA: Diagnosis not present

## 2022-07-29 DIAGNOSIS — J449 Chronic obstructive pulmonary disease, unspecified: Secondary | ICD-10-CM | POA: Diagnosis not present

## 2022-07-29 DIAGNOSIS — N390 Urinary tract infection, site not specified: Secondary | ICD-10-CM | POA: Diagnosis not present

## 2022-07-29 DIAGNOSIS — J9611 Chronic respiratory failure with hypoxia: Secondary | ICD-10-CM | POA: Diagnosis not present

## 2022-07-30 DIAGNOSIS — F0393 Unspecified dementia, unspecified severity, with mood disturbance: Secondary | ICD-10-CM | POA: Diagnosis not present

## 2022-07-30 DIAGNOSIS — N390 Urinary tract infection, site not specified: Secondary | ICD-10-CM | POA: Diagnosis not present

## 2022-07-30 DIAGNOSIS — I10 Essential (primary) hypertension: Secondary | ICD-10-CM | POA: Diagnosis not present

## 2022-07-30 DIAGNOSIS — J9611 Chronic respiratory failure with hypoxia: Secondary | ICD-10-CM | POA: Diagnosis not present

## 2022-07-30 DIAGNOSIS — J449 Chronic obstructive pulmonary disease, unspecified: Secondary | ICD-10-CM | POA: Diagnosis not present

## 2022-07-30 DIAGNOSIS — E119 Type 2 diabetes mellitus without complications: Secondary | ICD-10-CM | POA: Diagnosis not present

## 2022-07-31 DIAGNOSIS — F0393 Unspecified dementia, unspecified severity, with mood disturbance: Secondary | ICD-10-CM | POA: Diagnosis not present

## 2022-07-31 DIAGNOSIS — I10 Essential (primary) hypertension: Secondary | ICD-10-CM | POA: Diagnosis not present

## 2022-07-31 DIAGNOSIS — J9611 Chronic respiratory failure with hypoxia: Secondary | ICD-10-CM | POA: Diagnosis not present

## 2022-07-31 DIAGNOSIS — N390 Urinary tract infection, site not specified: Secondary | ICD-10-CM | POA: Diagnosis not present

## 2022-07-31 DIAGNOSIS — J449 Chronic obstructive pulmonary disease, unspecified: Secondary | ICD-10-CM | POA: Diagnosis not present

## 2022-07-31 DIAGNOSIS — E119 Type 2 diabetes mellitus without complications: Secondary | ICD-10-CM | POA: Diagnosis not present

## 2022-08-04 DIAGNOSIS — J9611 Chronic respiratory failure with hypoxia: Secondary | ICD-10-CM | POA: Diagnosis not present

## 2022-08-04 DIAGNOSIS — F0393 Unspecified dementia, unspecified severity, with mood disturbance: Secondary | ICD-10-CM | POA: Diagnosis not present

## 2022-08-04 DIAGNOSIS — J449 Chronic obstructive pulmonary disease, unspecified: Secondary | ICD-10-CM | POA: Diagnosis not present

## 2022-08-04 DIAGNOSIS — E119 Type 2 diabetes mellitus without complications: Secondary | ICD-10-CM | POA: Diagnosis not present

## 2022-08-04 DIAGNOSIS — N390 Urinary tract infection, site not specified: Secondary | ICD-10-CM | POA: Diagnosis not present

## 2022-08-04 DIAGNOSIS — I10 Essential (primary) hypertension: Secondary | ICD-10-CM | POA: Diagnosis not present

## 2022-08-05 DIAGNOSIS — I10 Essential (primary) hypertension: Secondary | ICD-10-CM | POA: Diagnosis not present

## 2022-08-05 DIAGNOSIS — J449 Chronic obstructive pulmonary disease, unspecified: Secondary | ICD-10-CM | POA: Diagnosis not present

## 2022-08-05 DIAGNOSIS — N390 Urinary tract infection, site not specified: Secondary | ICD-10-CM | POA: Diagnosis not present

## 2022-08-05 DIAGNOSIS — J9611 Chronic respiratory failure with hypoxia: Secondary | ICD-10-CM | POA: Diagnosis not present

## 2022-08-05 DIAGNOSIS — F0393 Unspecified dementia, unspecified severity, with mood disturbance: Secondary | ICD-10-CM | POA: Diagnosis not present

## 2022-08-05 DIAGNOSIS — E119 Type 2 diabetes mellitus without complications: Secondary | ICD-10-CM | POA: Diagnosis not present

## 2022-08-07 DIAGNOSIS — N390 Urinary tract infection, site not specified: Secondary | ICD-10-CM | POA: Diagnosis not present

## 2022-08-07 DIAGNOSIS — J9611 Chronic respiratory failure with hypoxia: Secondary | ICD-10-CM | POA: Diagnosis not present

## 2022-08-07 DIAGNOSIS — F0393 Unspecified dementia, unspecified severity, with mood disturbance: Secondary | ICD-10-CM | POA: Diagnosis not present

## 2022-08-07 DIAGNOSIS — J449 Chronic obstructive pulmonary disease, unspecified: Secondary | ICD-10-CM | POA: Diagnosis not present

## 2022-08-07 DIAGNOSIS — I10 Essential (primary) hypertension: Secondary | ICD-10-CM | POA: Diagnosis not present

## 2022-08-07 DIAGNOSIS — E119 Type 2 diabetes mellitus without complications: Secondary | ICD-10-CM | POA: Diagnosis not present

## 2022-08-10 DIAGNOSIS — F0393 Unspecified dementia, unspecified severity, with mood disturbance: Secondary | ICD-10-CM | POA: Diagnosis not present

## 2022-08-10 DIAGNOSIS — J9611 Chronic respiratory failure with hypoxia: Secondary | ICD-10-CM | POA: Diagnosis not present

## 2022-08-10 DIAGNOSIS — I10 Essential (primary) hypertension: Secondary | ICD-10-CM | POA: Diagnosis not present

## 2022-08-10 DIAGNOSIS — E119 Type 2 diabetes mellitus without complications: Secondary | ICD-10-CM | POA: Diagnosis not present

## 2022-08-10 DIAGNOSIS — N390 Urinary tract infection, site not specified: Secondary | ICD-10-CM | POA: Diagnosis not present

## 2022-08-10 DIAGNOSIS — J449 Chronic obstructive pulmonary disease, unspecified: Secondary | ICD-10-CM | POA: Diagnosis not present

## 2022-08-11 DIAGNOSIS — N390 Urinary tract infection, site not specified: Secondary | ICD-10-CM | POA: Diagnosis not present

## 2022-08-11 DIAGNOSIS — J9611 Chronic respiratory failure with hypoxia: Secondary | ICD-10-CM | POA: Diagnosis not present

## 2022-08-11 DIAGNOSIS — F0393 Unspecified dementia, unspecified severity, with mood disturbance: Secondary | ICD-10-CM | POA: Diagnosis not present

## 2022-08-11 DIAGNOSIS — E119 Type 2 diabetes mellitus without complications: Secondary | ICD-10-CM | POA: Diagnosis not present

## 2022-08-11 DIAGNOSIS — I10 Essential (primary) hypertension: Secondary | ICD-10-CM | POA: Diagnosis not present

## 2022-08-11 DIAGNOSIS — J449 Chronic obstructive pulmonary disease, unspecified: Secondary | ICD-10-CM | POA: Diagnosis not present

## 2022-08-12 DIAGNOSIS — J449 Chronic obstructive pulmonary disease, unspecified: Secondary | ICD-10-CM | POA: Diagnosis not present

## 2022-08-12 DIAGNOSIS — K2270 Barrett's esophagus without dysplasia: Secondary | ICD-10-CM | POA: Diagnosis not present

## 2022-08-12 DIAGNOSIS — K219 Gastro-esophageal reflux disease without esophagitis: Secondary | ICD-10-CM | POA: Diagnosis not present

## 2022-08-12 DIAGNOSIS — F0393 Unspecified dementia, unspecified severity, with mood disturbance: Secondary | ICD-10-CM | POA: Diagnosis not present

## 2022-08-12 DIAGNOSIS — J9611 Chronic respiratory failure with hypoxia: Secondary | ICD-10-CM | POA: Diagnosis not present

## 2022-08-12 DIAGNOSIS — E785 Hyperlipidemia, unspecified: Secondary | ICD-10-CM | POA: Diagnosis not present

## 2022-08-12 DIAGNOSIS — N390 Urinary tract infection, site not specified: Secondary | ICD-10-CM | POA: Diagnosis not present

## 2022-08-12 DIAGNOSIS — I10 Essential (primary) hypertension: Secondary | ICD-10-CM | POA: Diagnosis not present

## 2022-08-12 DIAGNOSIS — E119 Type 2 diabetes mellitus without complications: Secondary | ICD-10-CM | POA: Diagnosis not present

## 2022-08-13 DIAGNOSIS — J9611 Chronic respiratory failure with hypoxia: Secondary | ICD-10-CM | POA: Diagnosis not present

## 2022-08-13 DIAGNOSIS — I10 Essential (primary) hypertension: Secondary | ICD-10-CM | POA: Diagnosis not present

## 2022-08-13 DIAGNOSIS — N390 Urinary tract infection, site not specified: Secondary | ICD-10-CM | POA: Diagnosis not present

## 2022-08-13 DIAGNOSIS — E119 Type 2 diabetes mellitus without complications: Secondary | ICD-10-CM | POA: Diagnosis not present

## 2022-08-13 DIAGNOSIS — J449 Chronic obstructive pulmonary disease, unspecified: Secondary | ICD-10-CM | POA: Diagnosis not present

## 2022-08-13 DIAGNOSIS — F0393 Unspecified dementia, unspecified severity, with mood disturbance: Secondary | ICD-10-CM | POA: Diagnosis not present

## 2022-08-14 ENCOUNTER — Telehealth: Payer: Self-pay | Admitting: Pharmacist

## 2022-08-14 DIAGNOSIS — J9611 Chronic respiratory failure with hypoxia: Secondary | ICD-10-CM | POA: Diagnosis not present

## 2022-08-14 DIAGNOSIS — I10 Essential (primary) hypertension: Secondary | ICD-10-CM | POA: Diagnosis not present

## 2022-08-14 DIAGNOSIS — J449 Chronic obstructive pulmonary disease, unspecified: Secondary | ICD-10-CM | POA: Diagnosis not present

## 2022-08-14 DIAGNOSIS — E119 Type 2 diabetes mellitus without complications: Secondary | ICD-10-CM | POA: Diagnosis not present

## 2022-08-14 DIAGNOSIS — N390 Urinary tract infection, site not specified: Secondary | ICD-10-CM | POA: Diagnosis not present

## 2022-08-14 DIAGNOSIS — F0393 Unspecified dementia, unspecified severity, with mood disturbance: Secondary | ICD-10-CM | POA: Diagnosis not present

## 2022-08-14 NOTE — Progress Notes (Signed)
Care Management & Coordination Services Pharmacy Team  Reason for Encounter: Medication coordination and delivery  Contacted patient to discuss medications and coordinate delivery from [facility]. Spoke with family on 08/14/2022  Cycle dispensing form sent to Briana Barr for review.   Last adherence delivery date: 07/27/2022      Patient is due for next adherence delivery on: 08/26/2022  This delivery to include: Adherence Packaging  30 Days  Glipizide ER 5 mg one tablet at breakfast Metformin 100 mg one at breakfast and one at evening meal Escitalopram 10 mg one at breakfast Losartan Potassium 50 mg one at breakfast Omeprazole 20 mg one at breakfast, one at evening meal Hydrocodone-APAP 5-325 mg four times daily prn   Refills requested from providers include: Glipizide ER 5 mg one tablet at breakfast Losartan Potassium 50 mg one at breakfast Omeprazole 20 mg one at breakfast, one at evening meal Hydrocodone-APAP 5-325 mg four times daily prn  Confirmed delivery date of 08/26/2022, advised patient that pharmacy will contact them the morning of delivery.   Any concerns about your medications? No  How often do you forget or accidentally miss a dose? Never  Do you use a pillbox? No  Is patient in packaging Yes  If yes  Any concerns or issues with your packaging? no   Chart review: Recent office visits:  None  Recent consult visits:  None  Hospital visits:  None in previous 6 months  Medications: Outpatient Encounter Medications as of 08/14/2022  Medication Sig   acetaZOLAMIDE (DIAMOX) 250 MG tablet Take 1 tablet (250 mg total) by mouth every morning.   albuterol (PROAIR HFA) 108 (90 Base) MCG/ACT inhaler Inhale 2 puffs into the lungs every 6 (six) hours as needed for wheezing or shortness of breath.   albuterol (PROVENTIL) (2.5 MG/3ML) 0.083% nebulizer solution INHALE THE CONTENTS OF 1 VIAL VIA NEBULIZATION EVERY 6 HOURS AS NEEDED FOR [facility] AND SHORTNESS OF  BREATH (Patient taking differently: Take 2.5 mg by nebulization every 6 (six) hours as needed for wheezing or shortness of breath.)   aspirin 81 MG tablet Take 81 mg by mouth daily.   Cholecalciferol (VITAMIN D3) 250 MCG (10000 UT) capsule Take 1 capsule (10,000 Units total) by mouth daily.   ELDERBERRY PO Take 1 each by mouth daily.   escitalopram (LEXAPRO) 10 MG tablet TAKE ONE TABLET BY MOUTH EVERY MORNING   Fluticasone-Umeclidin-Vilant (TRELEGY ELLIPTA) 100-62.5-25 MCG/ACT AEPB Inhale 1 Dose into the lungs daily. (Patient taking differently: Inhale 1 Dose into the lungs every evening.)   glipiZIDE (GLUCOTROL XL) 5 MG 24 hr tablet Take 1 tablet (5 mg total) by mouth every morning.   HYDROcodone-acetaminophen (NORCO/VICODIN) 5-325 MG tablet TAKE ONE TABLET BY MOUTH every SIX hours AS NEEDED FOR moderate pain   losartan (COZAAR) 50 MG tablet Take 1 tablet (50 mg total) by mouth daily.   metFORMIN (GLUCOPHAGE) 1000 MG tablet TAKE ONE TABLET BY MOUTH TWICE DAILY WITH FOOD (Patient taking differently: Take 1,000 mg by mouth 2 (two) times daily with a meal.)   Multiple Vitamins-Minerals (CENTRUM SILVER 50+WOMEN) TABS Take 1 tablet by mouth daily.   nicotine (NICODERM CQ - DOSED IN MG/24 HOURS) 21 mg/24hr patch Place 1 patch (21 mg total) onto the skin daily as needed (smoking cessation).   omeprazole (PRILOSEC) 20 MG capsule TAKE 1 CAPSULE BY MOUTH TWICE A DAY BEFORE A MEAL (Patient taking differently: Take 20 mg by mouth 2 (two) times daily before a meal.)   predniSONE (DELTASONE) 10  MG tablet Take 1 tablet (10 mg total) by mouth daily. Take 4 pills daily for 3 days then 3 pills daily for 3 days then 2 pills daily for 3 days then 1 pill daily for 3 days then stop   rosuvastatin (CRESTOR) 20 MG tablet Take 1 tablet (20 mg total) by mouth every evening.   sitaGLIPtin (JANUVIA) 100 MG tablet Take 1 tablet (100 mg total) by mouth daily.   No facility-administered encounter medications on file as of  08/14/2022.   BP Readings from Last 3 Encounters:  06/10/22 (!) 147/77  05/19/22 119/61  01/22/22 120/72    Pulse Readings from Last 3 Encounters:  06/10/22 87  05/19/22 82  01/22/22 81    Lab Results  Component Value Date/Time   HGBA1C 7.4 (H) 01/22/2022 02:23 PM   HGBA1C 7.7 (H) 12/09/2020 11:52 AM   Lab Results  Component Value Date   CREATININE 0.84 06/10/2022   BUN 16 06/10/2022   GFRNONAA >60 06/10/2022   GFRAA 101 12/01/2019   NA 144 06/10/2022   K 4.0 06/10/2022   CALCIUM 9.1 06/10/2022   CO2 24 06/10/2022    No future appointments.  April D Calhoun, [facility] Clinical Pharmacist Assistant [phone]

## 2022-08-18 DIAGNOSIS — N390 Urinary tract infection, site not specified: Secondary | ICD-10-CM | POA: Diagnosis not present

## 2022-08-18 DIAGNOSIS — E119 Type 2 diabetes mellitus without complications: Secondary | ICD-10-CM | POA: Diagnosis not present

## 2022-08-18 DIAGNOSIS — I10 Essential (primary) hypertension: Secondary | ICD-10-CM | POA: Diagnosis not present

## 2022-08-18 DIAGNOSIS — J449 Chronic obstructive pulmonary disease, unspecified: Secondary | ICD-10-CM | POA: Diagnosis not present

## 2022-08-18 DIAGNOSIS — F0393 Unspecified dementia, unspecified severity, with mood disturbance: Secondary | ICD-10-CM | POA: Diagnosis not present

## 2022-08-18 DIAGNOSIS — J9611 Chronic respiratory failure with hypoxia: Secondary | ICD-10-CM | POA: Diagnosis not present

## 2022-08-19 DIAGNOSIS — J449 Chronic obstructive pulmonary disease, unspecified: Secondary | ICD-10-CM | POA: Diagnosis not present

## 2022-08-19 DIAGNOSIS — N390 Urinary tract infection, site not specified: Secondary | ICD-10-CM | POA: Diagnosis not present

## 2022-08-19 DIAGNOSIS — J9611 Chronic respiratory failure with hypoxia: Secondary | ICD-10-CM | POA: Diagnosis not present

## 2022-08-19 DIAGNOSIS — E119 Type 2 diabetes mellitus without complications: Secondary | ICD-10-CM | POA: Diagnosis not present

## 2022-08-19 DIAGNOSIS — I10 Essential (primary) hypertension: Secondary | ICD-10-CM | POA: Diagnosis not present

## 2022-08-19 DIAGNOSIS — F0393 Unspecified dementia, unspecified severity, with mood disturbance: Secondary | ICD-10-CM | POA: Diagnosis not present

## 2022-08-21 ENCOUNTER — Other Ambulatory Visit: Payer: Self-pay | Admitting: Family Medicine

## 2022-08-21 DIAGNOSIS — I10 Essential (primary) hypertension: Secondary | ICD-10-CM | POA: Diagnosis not present

## 2022-08-21 DIAGNOSIS — N390 Urinary tract infection, site not specified: Secondary | ICD-10-CM | POA: Diagnosis not present

## 2022-08-21 DIAGNOSIS — F0393 Unspecified dementia, unspecified severity, with mood disturbance: Secondary | ICD-10-CM | POA: Diagnosis not present

## 2022-08-21 DIAGNOSIS — J449 Chronic obstructive pulmonary disease, unspecified: Secondary | ICD-10-CM | POA: Diagnosis not present

## 2022-08-21 DIAGNOSIS — E119 Type 2 diabetes mellitus without complications: Secondary | ICD-10-CM | POA: Diagnosis not present

## 2022-08-21 DIAGNOSIS — J9611 Chronic respiratory failure with hypoxia: Secondary | ICD-10-CM | POA: Diagnosis not present

## 2022-08-21 NOTE — Telephone Encounter (Signed)
Requested Prescriptions  Pending Prescriptions Disp Refills   glipiZIDE (GLUCOTROL XL) 5 MG 24 hr tablet [Pharmacy Med Name: glipizide ER 5 mg tablet, extended release 24 hr] 90 tablet 0    Sig: TAKE ONE TABLET BY MOUTH EVERY MORNING     Endocrinology:  Diabetes - Sulfonylureas Failed - 08/21/2022 11:13 AM      Failed - HBA1C is between 0 and 7.9 and within 180 days    Hgb A1c MFr Bld  Date Value Ref Range Status  01/22/2022 7.4 (H) <5.7 % of total Hgb Final    Comment:    For someone without known diabetes, a hemoglobin A1c value of 6.5% or greater indicates that they may have  diabetes and this should be confirmed with a follow-up  test. . For someone with known diabetes, a value <7% indicates  that their diabetes is well controlled and a value  greater than or equal to 7% indicates suboptimal  control. A1c targets should be individualized based on  duration of diabetes, age, comorbid conditions, and  other considerations. . Currently, no consensus exists regarding use of hemoglobin A1c for diagnosis of diabetes for children. .          Failed - Valid encounter within last 6 months    Recent Outpatient Visits           1 year ago Community acquired pneumonia of left lower lobe of lung   [facility] Pickard, Priscille Heidelberg, MD   1 year ago Diabetes mellitus type 2 in nonobese [facility])   [facility] Pickard, Priscille Heidelberg, MD   2 years ago Panlobular emphysema (HCC)   [facility] Donita Brooks, MD   2 years ago Shortness of breath   [facility] Donita Brooks, MD   2 years ago Chronic respiratory failure with hypoxia (HCC)   [facility] Pickard, Priscille Heidelberg, MD              Passed - Cr in normal range and within 360 days    Creat  Date Value Ref Range Status  01/22/2022 0.65 0.60 - 0.95 mg/dL Final   Creatinine, Ser  Date Value Ref Range Status  06/10/2022 0.84 0.44 - 1.00 mg/dL Final           losartan (COZAAR) 50 MG tablet [Pharmacy Med Name: losartan 50 mg tablet] 90 tablet 0    Sig: TAKE ONE TABLET BY MOUTH EVERY MORNING     Cardiovascular:  Angiotensin Receptor Blockers Failed - 08/21/2022 11:13 AM      Failed - Last BP in normal range    BP Readings from Last 1 Encounters:  06/10/22 (!) 147/77         Failed - Valid encounter within last 6 months    Recent Outpatient Visits           1 year ago Community acquired pneumonia of left lower lobe of lung   [facility] Donita Brooks, MD   1 year ago Diabetes mellitus type 2 in nonobese [facility])   [facility] Donita Brooks, MD   2 years ago Panlobular emphysema [facility])   [facility] Donita Brooks, MD   2 years ago Shortness of breath   [facility] Donita Brooks, MD   2 years ago Chronic respiratory failure with hypoxia [facility])   [facility] Donita Brooks,  MD              Passed - Cr in normal range and within 180 days    Creat  Date Value Ref Range Status  01/22/2022 0.65 0.60 - 0.95 mg/dL Final   Creatinine, Ser  Date Value Ref Range Status  06/10/2022 0.84 0.44 - 1.00 mg/dL Final         Passed - K in normal range and within 180 days    Potassium  Date Value Ref Range Status  06/10/2022 4.0 3.5 - 5.1 mmol/L Final         Passed - Patient is not pregnant       omeprazole (PRILOSEC) 20 MG capsule [Pharmacy Med Name: omeprazole 20 mg capsule,delayed release] 180 capsule 0    Sig: TAKE ONE CAPSULE BY MOUTH TWICE DAILY     Gastroenterology: Proton Pump Inhibitors Failed - 08/21/2022 11:13 AM      Failed - Valid encounter within last 12 months    Recent Outpatient Visits           1 year ago Community acquired pneumonia of left lower lobe of lung   [facility] Pickard, Priscille Heidelberg, MD   1 year ago Diabetes mellitus type 2 in nonobese [facility])   [facility] Pickard,  Priscille Heidelberg, MD   2 years ago Panlobular emphysema (HCC)   [facility] Tanya Nones, Priscille Heidelberg, MD   2 years ago Shortness of breath   [facility] Donita Brooks, MD   2 years ago Chronic respiratory failure with hypoxia (HCC)   [facility] Pickard, Priscille Heidelberg, MD               HYDROcodone-acetaminophen (NORCO/VICODIN) 5-325 MG tablet [Pharmacy Med Name: hydrocodone 5 mg-acetaminophen 325 mg tablet] 90 tablet 0    Sig: TAKE ONE TABLET BY MOUTH every SIX hours AS NEEDED FOR moderate pain     Not Delegated - Analgesics:  Opioid Agonist Combinations Failed - 08/21/2022 11:13 AM      Failed - This refill cannot be delegated      Failed - Urine Drug Screen completed in last 360 days      Failed - Valid encounter within last 3 months    Recent Outpatient Visits           1 year ago Community acquired pneumonia of left lower lobe of lung   [facility] Donita Brooks, MD   1 year ago Diabetes mellitus type 2 in nonobese [facility])   [facility] Donita Brooks, MD   2 years ago Panlobular emphysema (HCC)   [facility] Donita Brooks, MD   2 years ago Shortness of breath   [facility] Donita Brooks, MD   2 years ago Chronic respiratory failure with hypoxia (HCC)   [facility] Pickard, Priscille Heidelberg, MD

## 2022-08-21 NOTE — Telephone Encounter (Signed)
Requested medication (s) are due for refill today: yes  Requested medication (s) are on the active medication list: yes  Last refill:  07/21/22  Future visit scheduled: no  Notes to clinic:  Unable to refill per protocol, cannot delegate.      Requested Prescriptions  Pending Prescriptions Disp Refills   HYDROcodone-acetaminophen (NORCO/VICODIN) 5-325 MG tablet [Pharmacy Med Name: hydrocodone 5 mg-acetaminophen 325 mg tablet] 90 tablet 0    Sig: TAKE ONE TABLET BY MOUTH every SIX hours AS NEEDED FOR moderate pain     Not Delegated - Analgesics:  Opioid Agonist Combinations Failed - 08/21/2022 11:13 AM      Failed - This refill cannot be delegated      Failed - Urine Drug Screen completed in last 360 days      Failed - Valid encounter within last 3 months    Recent Outpatient Visits           1 year ago Community acquired pneumonia of left lower lobe of lung   [facility] Pickard, Priscille Heidelberg, MD   1 year ago Diabetes mellitus type 2 in nonobese [facility])   [facility] Donita Brooks, MD   2 years ago Panlobular emphysema (HCC)   [facility] Donita Brooks, MD   2 years ago Shortness of breath   [facility] Pickard, Priscille Heidelberg, MD   2 years ago Chronic respiratory failure with hypoxia (HCC)   [facility] Pickard, Priscille Heidelberg, MD              Signed Prescriptions Disp Refills   glipiZIDE (GLUCOTROL XL) 5 MG 24 hr tablet 90 tablet 0    Sig: TAKE ONE TABLET BY MOUTH EVERY MORNING     Endocrinology:  Diabetes - Sulfonylureas Failed - 08/21/2022 11:13 AM      Failed - HBA1C is between 0 and 7.9 and within 180 days    Hgb A1c MFr Bld  Date Value Ref Range Status  01/22/2022 7.4 (H) <5.7 % of total Hgb Final    Comment:    For someone without known diabetes, a hemoglobin A1c value of 6.5% or greater indicates that they may have  diabetes and this should be confirmed with a follow-up   test. . For someone with known diabetes, a value <7% indicates  that their diabetes is well controlled and a value  greater than or equal to 7% indicates suboptimal  control. A1c targets should be individualized based on  duration of diabetes, age, comorbid conditions, and  other considerations. . Currently, no consensus exists regarding use of hemoglobin A1c for diagnosis of diabetes for children. .          Failed - Valid encounter within last 6 months    Recent Outpatient Visits           1 year ago Community acquired pneumonia of left lower lobe of lung   [facility] Pickard, Priscille Heidelberg, MD   1 year ago Diabetes mellitus type 2 in nonobese [facility])   [facility] Donita Brooks, MD   2 years ago Panlobular emphysema [facility])   [facility] Donita Brooks, MD   2 years ago Shortness of breath   [facility] Donita Brooks, MD   2 years ago Chronic respiratory failure with hypoxia [facility])   [facility] Pickard, Priscille Heidelberg, MD  Passed - Cr in normal range and within 360 days    Creat  Date Value Ref Range Status  01/22/2022 0.65 0.60 - 0.95 mg/dL Final   Creatinine, Ser  Date Value Ref Range Status  06/10/2022 0.84 0.44 - 1.00 mg/dL Final          losartan (COZAAR) 50 MG tablet 90 tablet 0    Sig: TAKE ONE TABLET BY MOUTH EVERY MORNING     Cardiovascular:  Angiotensin Receptor Blockers Failed - 08/21/2022 11:13 AM      Failed - Last BP in normal range    BP Readings from Last 1 Encounters:  06/10/22 (!) 147/77         Failed - Valid encounter within last 6 months    Recent Outpatient Visits           1 year ago Community acquired pneumonia of left lower lobe of lung   [facility] Donita Brooks, MD   1 year ago Diabetes mellitus type 2 in nonobese [facility])   [facility] Pickard, Priscille Heidelberg, MD   2 years ago Panlobular emphysema  (HCC)   [facility] Donita Brooks, MD   2 years ago Shortness of breath   [facility] Donita Brooks, MD   2 years ago Chronic respiratory failure with hypoxia (HCC)   [facility] Pickard, Priscille Heidelberg, MD              Passed - Cr in normal range and within 180 days    Creat  Date Value Ref Range Status  01/22/2022 0.65 0.60 - 0.95 mg/dL Final   Creatinine, Ser  Date Value Ref Range Status  06/10/2022 0.84 0.44 - 1.00 mg/dL Final         Passed - K in normal range and within 180 days    Potassium  Date Value Ref Range Status  06/10/2022 4.0 3.5 - 5.1 mmol/L Final         Passed - Patient is not pregnant       omeprazole (PRILOSEC) 20 MG capsule 180 capsule 0    Sig: TAKE ONE CAPSULE BY MOUTH TWICE DAILY     Gastroenterology: Proton Pump Inhibitors Failed - 08/21/2022 11:13 AM      Failed - Valid encounter within last 12 months    Recent Outpatient Visits           1 year ago Community acquired pneumonia of left lower lobe of lung   [facility] Donita Brooks, MD   1 year ago Diabetes mellitus type 2 in nonobese [facility])   [facility] Donita Brooks, MD   2 years ago Panlobular emphysema (HCC)   [facility] Donita Brooks, MD   2 years ago Shortness of breath   [facility] Donita Brooks, MD   2 years ago Chronic respiratory failure with hypoxia (HCC)   [facility] Pickard, Priscille Heidelberg, MD

## 2022-08-22 DIAGNOSIS — I10 Essential (primary) hypertension: Secondary | ICD-10-CM | POA: Diagnosis not present

## 2022-08-22 DIAGNOSIS — J9611 Chronic respiratory failure with hypoxia: Secondary | ICD-10-CM | POA: Diagnosis not present

## 2022-08-22 DIAGNOSIS — N390 Urinary tract infection, site not specified: Secondary | ICD-10-CM | POA: Diagnosis not present

## 2022-08-22 DIAGNOSIS — E119 Type 2 diabetes mellitus without complications: Secondary | ICD-10-CM | POA: Diagnosis not present

## 2022-08-22 DIAGNOSIS — F0393 Unspecified dementia, unspecified severity, with mood disturbance: Secondary | ICD-10-CM | POA: Diagnosis not present

## 2022-08-22 DIAGNOSIS — J449 Chronic obstructive pulmonary disease, unspecified: Secondary | ICD-10-CM | POA: Diagnosis not present

## 2022-08-24 DIAGNOSIS — E119 Type 2 diabetes mellitus without complications: Secondary | ICD-10-CM | POA: Diagnosis not present

## 2022-08-24 DIAGNOSIS — I10 Essential (primary) hypertension: Secondary | ICD-10-CM | POA: Diagnosis not present

## 2022-08-24 DIAGNOSIS — F0393 Unspecified dementia, unspecified severity, with mood disturbance: Secondary | ICD-10-CM | POA: Diagnosis not present

## 2022-08-24 DIAGNOSIS — J9611 Chronic respiratory failure with hypoxia: Secondary | ICD-10-CM | POA: Diagnosis not present

## 2022-08-24 DIAGNOSIS — J449 Chronic obstructive pulmonary disease, unspecified: Secondary | ICD-10-CM | POA: Diagnosis not present

## 2022-08-24 DIAGNOSIS — N390 Urinary tract infection, site not specified: Secondary | ICD-10-CM | POA: Diagnosis not present

## 2022-08-25 DIAGNOSIS — J449 Chronic obstructive pulmonary disease, unspecified: Secondary | ICD-10-CM | POA: Diagnosis not present

## 2022-08-25 DIAGNOSIS — N390 Urinary tract infection, site not specified: Secondary | ICD-10-CM | POA: Diagnosis not present

## 2022-08-25 DIAGNOSIS — J9611 Chronic respiratory failure with hypoxia: Secondary | ICD-10-CM | POA: Diagnosis not present

## 2022-08-25 DIAGNOSIS — E119 Type 2 diabetes mellitus without complications: Secondary | ICD-10-CM | POA: Diagnosis not present

## 2022-08-25 DIAGNOSIS — F0393 Unspecified dementia, unspecified severity, with mood disturbance: Secondary | ICD-10-CM | POA: Diagnosis not present

## 2022-08-25 DIAGNOSIS — I10 Essential (primary) hypertension: Secondary | ICD-10-CM | POA: Diagnosis not present

## 2022-08-26 DIAGNOSIS — N390 Urinary tract infection, site not specified: Secondary | ICD-10-CM | POA: Diagnosis not present

## 2022-08-26 DIAGNOSIS — E119 Type 2 diabetes mellitus without complications: Secondary | ICD-10-CM | POA: Diagnosis not present

## 2022-08-26 DIAGNOSIS — I10 Essential (primary) hypertension: Secondary | ICD-10-CM | POA: Diagnosis not present

## 2022-08-26 DIAGNOSIS — J449 Chronic obstructive pulmonary disease, unspecified: Secondary | ICD-10-CM | POA: Diagnosis not present

## 2022-08-26 DIAGNOSIS — F0393 Unspecified dementia, unspecified severity, with mood disturbance: Secondary | ICD-10-CM | POA: Diagnosis not present

## 2022-08-26 DIAGNOSIS — J9611 Chronic respiratory failure with hypoxia: Secondary | ICD-10-CM | POA: Diagnosis not present

## 2022-08-27 DIAGNOSIS — F0393 Unspecified dementia, unspecified severity, with mood disturbance: Secondary | ICD-10-CM | POA: Diagnosis not present

## 2022-08-27 DIAGNOSIS — J449 Chronic obstructive pulmonary disease, unspecified: Secondary | ICD-10-CM | POA: Diagnosis not present

## 2022-08-27 DIAGNOSIS — N390 Urinary tract infection, site not specified: Secondary | ICD-10-CM | POA: Diagnosis not present

## 2022-08-27 DIAGNOSIS — E119 Type 2 diabetes mellitus without complications: Secondary | ICD-10-CM | POA: Diagnosis not present

## 2022-08-27 DIAGNOSIS — I10 Essential (primary) hypertension: Secondary | ICD-10-CM | POA: Diagnosis not present

## 2022-08-27 DIAGNOSIS — J9611 Chronic respiratory failure with hypoxia: Secondary | ICD-10-CM | POA: Diagnosis not present

## 2022-08-28 DIAGNOSIS — J9611 Chronic respiratory failure with hypoxia: Secondary | ICD-10-CM | POA: Diagnosis not present

## 2022-08-28 DIAGNOSIS — N390 Urinary tract infection, site not specified: Secondary | ICD-10-CM | POA: Diagnosis not present

## 2022-08-28 DIAGNOSIS — F0393 Unspecified dementia, unspecified severity, with mood disturbance: Secondary | ICD-10-CM | POA: Diagnosis not present

## 2022-08-28 DIAGNOSIS — I10 Essential (primary) hypertension: Secondary | ICD-10-CM | POA: Diagnosis not present

## 2022-08-28 DIAGNOSIS — J449 Chronic obstructive pulmonary disease, unspecified: Secondary | ICD-10-CM | POA: Diagnosis not present

## 2022-08-28 DIAGNOSIS — E119 Type 2 diabetes mellitus without complications: Secondary | ICD-10-CM | POA: Diagnosis not present

## 2022-09-01 DIAGNOSIS — J449 Chronic obstructive pulmonary disease, unspecified: Secondary | ICD-10-CM | POA: Diagnosis not present

## 2022-09-01 DIAGNOSIS — I10 Essential (primary) hypertension: Secondary | ICD-10-CM | POA: Diagnosis not present

## 2022-09-01 DIAGNOSIS — F0393 Unspecified dementia, unspecified severity, with mood disturbance: Secondary | ICD-10-CM | POA: Diagnosis not present

## 2022-09-01 DIAGNOSIS — N390 Urinary tract infection, site not specified: Secondary | ICD-10-CM | POA: Diagnosis not present

## 2022-09-01 DIAGNOSIS — J9611 Chronic respiratory failure with hypoxia: Secondary | ICD-10-CM | POA: Diagnosis not present

## 2022-09-01 DIAGNOSIS — E119 Type 2 diabetes mellitus without complications: Secondary | ICD-10-CM | POA: Diagnosis not present

## 2022-09-02 DIAGNOSIS — N390 Urinary tract infection, site not specified: Secondary | ICD-10-CM | POA: Diagnosis not present

## 2022-09-02 DIAGNOSIS — E119 Type 2 diabetes mellitus without complications: Secondary | ICD-10-CM | POA: Diagnosis not present

## 2022-09-02 DIAGNOSIS — J9611 Chronic respiratory failure with hypoxia: Secondary | ICD-10-CM | POA: Diagnosis not present

## 2022-09-02 DIAGNOSIS — I10 Essential (primary) hypertension: Secondary | ICD-10-CM | POA: Diagnosis not present

## 2022-09-02 DIAGNOSIS — J449 Chronic obstructive pulmonary disease, unspecified: Secondary | ICD-10-CM | POA: Diagnosis not present

## 2022-09-02 DIAGNOSIS — F0393 Unspecified dementia, unspecified severity, with mood disturbance: Secondary | ICD-10-CM | POA: Diagnosis not present

## 2022-09-04 DIAGNOSIS — E119 Type 2 diabetes mellitus without complications: Secondary | ICD-10-CM | POA: Diagnosis not present

## 2022-09-04 DIAGNOSIS — J9611 Chronic respiratory failure with hypoxia: Secondary | ICD-10-CM | POA: Diagnosis not present

## 2022-09-04 DIAGNOSIS — J449 Chronic obstructive pulmonary disease, unspecified: Secondary | ICD-10-CM | POA: Diagnosis not present

## 2022-09-04 DIAGNOSIS — I10 Essential (primary) hypertension: Secondary | ICD-10-CM | POA: Diagnosis not present

## 2022-09-04 DIAGNOSIS — F0393 Unspecified dementia, unspecified severity, with mood disturbance: Secondary | ICD-10-CM | POA: Diagnosis not present

## 2022-09-04 DIAGNOSIS — N390 Urinary tract infection, site not specified: Secondary | ICD-10-CM | POA: Diagnosis not present

## 2022-09-08 DIAGNOSIS — E119 Type 2 diabetes mellitus without complications: Secondary | ICD-10-CM | POA: Diagnosis not present

## 2022-09-08 DIAGNOSIS — I10 Essential (primary) hypertension: Secondary | ICD-10-CM | POA: Diagnosis not present

## 2022-09-08 DIAGNOSIS — N390 Urinary tract infection, site not specified: Secondary | ICD-10-CM | POA: Diagnosis not present

## 2022-09-08 DIAGNOSIS — J9611 Chronic respiratory failure with hypoxia: Secondary | ICD-10-CM | POA: Diagnosis not present

## 2022-09-08 DIAGNOSIS — F0393 Unspecified dementia, unspecified severity, with mood disturbance: Secondary | ICD-10-CM | POA: Diagnosis not present

## 2022-09-08 DIAGNOSIS — J449 Chronic obstructive pulmonary disease, unspecified: Secondary | ICD-10-CM | POA: Diagnosis not present

## 2022-09-09 DIAGNOSIS — J9611 Chronic respiratory failure with hypoxia: Secondary | ICD-10-CM | POA: Diagnosis not present

## 2022-09-09 DIAGNOSIS — I10 Essential (primary) hypertension: Secondary | ICD-10-CM | POA: Diagnosis not present

## 2022-09-09 DIAGNOSIS — J449 Chronic obstructive pulmonary disease, unspecified: Secondary | ICD-10-CM | POA: Diagnosis not present

## 2022-09-09 DIAGNOSIS — F0393 Unspecified dementia, unspecified severity, with mood disturbance: Secondary | ICD-10-CM | POA: Diagnosis not present

## 2022-09-09 DIAGNOSIS — N390 Urinary tract infection, site not specified: Secondary | ICD-10-CM | POA: Diagnosis not present

## 2022-09-09 DIAGNOSIS — E119 Type 2 diabetes mellitus without complications: Secondary | ICD-10-CM | POA: Diagnosis not present

## 2022-09-10 DIAGNOSIS — N390 Urinary tract infection, site not specified: Secondary | ICD-10-CM | POA: Diagnosis not present

## 2022-09-10 DIAGNOSIS — E119 Type 2 diabetes mellitus without complications: Secondary | ICD-10-CM | POA: Diagnosis not present

## 2022-09-10 DIAGNOSIS — J449 Chronic obstructive pulmonary disease, unspecified: Secondary | ICD-10-CM | POA: Diagnosis not present

## 2022-09-10 DIAGNOSIS — J9611 Chronic respiratory failure with hypoxia: Secondary | ICD-10-CM | POA: Diagnosis not present

## 2022-09-10 DIAGNOSIS — F0393 Unspecified dementia, unspecified severity, with mood disturbance: Secondary | ICD-10-CM | POA: Diagnosis not present

## 2022-09-10 DIAGNOSIS — I10 Essential (primary) hypertension: Secondary | ICD-10-CM | POA: Diagnosis not present

## 2022-09-11 DIAGNOSIS — J9611 Chronic respiratory failure with hypoxia: Secondary | ICD-10-CM | POA: Diagnosis not present

## 2022-09-11 DIAGNOSIS — N390 Urinary tract infection, site not specified: Secondary | ICD-10-CM | POA: Diagnosis not present

## 2022-09-11 DIAGNOSIS — I10 Essential (primary) hypertension: Secondary | ICD-10-CM | POA: Diagnosis not present

## 2022-09-11 DIAGNOSIS — E119 Type 2 diabetes mellitus without complications: Secondary | ICD-10-CM | POA: Diagnosis not present

## 2022-09-11 DIAGNOSIS — F0393 Unspecified dementia, unspecified severity, with mood disturbance: Secondary | ICD-10-CM | POA: Diagnosis not present

## 2022-09-11 DIAGNOSIS — J449 Chronic obstructive pulmonary disease, unspecified: Secondary | ICD-10-CM | POA: Diagnosis not present

## 2022-09-12 DIAGNOSIS — I10 Essential (primary) hypertension: Secondary | ICD-10-CM | POA: Diagnosis not present

## 2022-09-12 DIAGNOSIS — N390 Urinary tract infection, site not specified: Secondary | ICD-10-CM | POA: Diagnosis not present

## 2022-09-12 DIAGNOSIS — F0393 Unspecified dementia, unspecified severity, with mood disturbance: Secondary | ICD-10-CM | POA: Diagnosis not present

## 2022-09-12 DIAGNOSIS — R32 Unspecified urinary incontinence: Secondary | ICD-10-CM | POA: Diagnosis not present

## 2022-09-12 DIAGNOSIS — Z9981 Dependence on supplemental oxygen: Secondary | ICD-10-CM | POA: Diagnosis not present

## 2022-09-12 DIAGNOSIS — J9611 Chronic respiratory failure with hypoxia: Secondary | ICD-10-CM | POA: Diagnosis not present

## 2022-09-12 DIAGNOSIS — K219 Gastro-esophageal reflux disease without esophagitis: Secondary | ICD-10-CM | POA: Diagnosis not present

## 2022-09-12 DIAGNOSIS — K2270 Barrett's esophagus without dysplasia: Secondary | ICD-10-CM | POA: Diagnosis not present

## 2022-09-12 DIAGNOSIS — R159 Full incontinence of feces: Secondary | ICD-10-CM | POA: Diagnosis not present

## 2022-09-12 DIAGNOSIS — J449 Chronic obstructive pulmonary disease, unspecified: Secondary | ICD-10-CM | POA: Diagnosis not present

## 2022-09-12 DIAGNOSIS — E785 Hyperlipidemia, unspecified: Secondary | ICD-10-CM | POA: Diagnosis not present

## 2022-09-12 DIAGNOSIS — E119 Type 2 diabetes mellitus without complications: Secondary | ICD-10-CM | POA: Diagnosis not present

## 2022-09-14 DIAGNOSIS — J449 Chronic obstructive pulmonary disease, unspecified: Secondary | ICD-10-CM | POA: Diagnosis not present

## 2022-09-14 DIAGNOSIS — F0393 Unspecified dementia, unspecified severity, with mood disturbance: Secondary | ICD-10-CM | POA: Diagnosis not present

## 2022-09-14 DIAGNOSIS — N390 Urinary tract infection, site not specified: Secondary | ICD-10-CM | POA: Diagnosis not present

## 2022-09-14 DIAGNOSIS — J9611 Chronic respiratory failure with hypoxia: Secondary | ICD-10-CM | POA: Diagnosis not present

## 2022-09-14 DIAGNOSIS — I10 Essential (primary) hypertension: Secondary | ICD-10-CM | POA: Diagnosis not present

## 2022-09-14 DIAGNOSIS — E119 Type 2 diabetes mellitus without complications: Secondary | ICD-10-CM | POA: Diagnosis not present

## 2022-09-15 ENCOUNTER — Telehealth: Payer: Self-pay

## 2022-09-15 ENCOUNTER — Telehealth: Payer: Self-pay | Admitting: Pharmacist

## 2022-09-15 ENCOUNTER — Other Ambulatory Visit: Payer: Self-pay | Admitting: Family Medicine

## 2022-09-15 DIAGNOSIS — I10 Essential (primary) hypertension: Secondary | ICD-10-CM | POA: Diagnosis not present

## 2022-09-15 DIAGNOSIS — E119 Type 2 diabetes mellitus without complications: Secondary | ICD-10-CM | POA: Diagnosis not present

## 2022-09-15 DIAGNOSIS — J449 Chronic obstructive pulmonary disease, unspecified: Secondary | ICD-10-CM | POA: Diagnosis not present

## 2022-09-15 DIAGNOSIS — F0393 Unspecified dementia, unspecified severity, with mood disturbance: Secondary | ICD-10-CM | POA: Diagnosis not present

## 2022-09-15 DIAGNOSIS — N390 Urinary tract infection, site not specified: Secondary | ICD-10-CM | POA: Diagnosis not present

## 2022-09-15 DIAGNOSIS — J9611 Chronic respiratory failure with hypoxia: Secondary | ICD-10-CM | POA: Diagnosis not present

## 2022-09-15 MED ORDER — HYDROCODONE-ACETAMINOPHEN 5-325 MG PO TABS: ORAL_TABLET | ORAL | 0 refills | Status: DC

## 2022-09-15 NOTE — Telephone Encounter (Signed)
Staff message from [facility]:  Briana Barr, Briana D  P [facility] Clinical Pool Patient has an upcoming delivery with [facility]. She will need refills on Hydrocodone-APAP 5-325 mg four times daily prn to complete her order. Please send refills to [facility].  Please send refills to [facility].  Thank you,  Briana Barr, [facility] Clinical Pharmacist Assistant [phone]  Last RF 08/21/2022. Last OV 01/22/2022.  My Chart message sent for pt to call and scheduled 6 month f/u appointment. Thanks.

## 2022-09-15 NOTE — Progress Notes (Signed)
Care Management & Coordination Services Pharmacy Team  Reason for Encounter: Medication coordination and delivery  Contacted patient to discuss medications and coordinate delivery from [facility]. Spoke with family on 09/15/2022  Cycle dispensing form sent to Erskine Emery for review.   Last adherence delivery date:  08/26/2022     Patient is due for next adherence delivery on: 09/24/2022  This delivery to include: Adherence Packaging  30 Days  Glipizide ER 5 mg one tablet at breakfast Metformin 100 mg one at breakfast and one at evening meal Escitalopram 10 mg one at breakfast Losartan Potassium 50 mg one at breakfast Omeprazole 20 mg one at breakfast, one at evening meal Hydrocodone-APAP 5-325 mg four times daily prn  Refills requested from providers include: Hydrocodone-APAP 5-325 mg four times daily prn  Confirmed delivery date of 09/24/2022, advised patient that pharmacy will contact them the morning of delivery.   Any concerns about your medications? No  How often do you forget or accidentally miss a dose? Rarely  Do you use a pillbox? No  Is patient in packaging Yes  If yes  Any concerns or issues with your packaging?   Chart review: Recent office visits:  None  Recent consult visits:  None  Hospital visits:  None in previous 6 months  Medications: Outpatient Encounter Medications as of 09/15/2022  Medication Sig   acetaZOLAMIDE (DIAMOX) 250 MG tablet Take 1 tablet (250 mg total) by mouth every morning.   albuterol (PROAIR HFA) 108 (90 Base) MCG/ACT inhaler Inhale 2 puffs into the lungs every 6 (six) hours as needed for wheezing or shortness of breath.   albuterol (PROVENTIL) (2.5 MG/3ML) 0.083% nebulizer solution INHALE THE CONTENTS OF 1 VIAL VIA NEBULIZATION EVERY 6 HOURS AS NEEDED FOR [facility] AND SHORTNESS OF BREATH (Patient taking differently: Take 2.5 mg by nebulization every 6 (six) hours as needed for wheezing or shortness of breath.)   aspirin 81 MG  tablet Take 81 mg by mouth daily.   Cholecalciferol (VITAMIN D3) 250 MCG (10000 UT) capsule Take 1 capsule (10,000 Units total) by mouth daily.   ELDERBERRY PO Take 1 each by mouth daily.   escitalopram (LEXAPRO) 10 MG tablet TAKE ONE TABLET BY MOUTH EVERY MORNING   Fluticasone-Umeclidin-Vilant (TRELEGY ELLIPTA) 100-62.5-25 MCG/ACT AEPB Inhale 1 Dose into the lungs daily. (Patient taking differently: Inhale 1 Dose into the lungs every evening.)   glipiZIDE (GLUCOTROL XL) 5 MG 24 hr tablet TAKE ONE TABLET BY MOUTH EVERY MORNING   HYDROcodone-acetaminophen (NORCO/VICODIN) 5-325 MG tablet TAKE ONE TABLET BY MOUTH every SIX hours AS NEEDED FOR moderate pain   losartan (COZAAR) 50 MG tablet TAKE ONE TABLET BY MOUTH EVERY MORNING   metFORMIN (GLUCOPHAGE) 1000 MG tablet TAKE ONE TABLET BY MOUTH TWICE DAILY WITH FOOD (Patient taking differently: Take 1,000 mg by mouth 2 (two) times daily with a meal.)   Multiple Vitamins-Minerals (CENTRUM SILVER 50+WOMEN) TABS Take 1 tablet by mouth daily.   nicotine (NICODERM CQ - DOSED IN MG/24 HOURS) 21 mg/24hr patch Place 1 patch (21 mg total) onto the skin daily as needed (smoking cessation).   omeprazole (PRILOSEC) 20 MG capsule TAKE ONE CAPSULE BY MOUTH TWICE DAILY   predniSONE (DELTASONE) 10 MG tablet Take 1 tablet (10 mg total) by mouth daily. Take 4 pills daily for 3 days then 3 pills daily for 3 days then 2 pills daily for 3 days then 1 pill daily for 3 days then stop   rosuvastatin (CRESTOR) 20 MG tablet Take 1 tablet (  20 mg total) by mouth every evening.   sitaGLIPtin (JANUVIA) 100 MG tablet Take 1 tablet (100 mg total) by mouth daily.   No facility-administered encounter medications on file as of 09/15/2022.   BP Readings from Last 3 Encounters:  06/10/22 (!) 147/77  05/19/22 119/61  01/22/22 120/72    Pulse Readings from Last 3 Encounters:  06/10/22 87  05/19/22 82  01/22/22 81    Lab Results  Component Value Date/Time   HGBA1C 7.4 (H) 01/22/2022  02:23 PM   HGBA1C 7.7 (H) 12/09/2020 11:52 AM   Lab Results  Component Value Date   CREATININE 0.84 06/10/2022   BUN 16 06/10/2022   GFRNONAA >60 06/10/2022   GFRAA 101 12/01/2019   NA 144 06/10/2022   K 4.0 06/10/2022   CALCIUM 9.1 06/10/2022   CO2 24 06/10/2022    No future appointments.  April D Calhoun, [facility] Clinical Pharmacist Assistant [phone]

## 2022-09-16 DIAGNOSIS — E119 Type 2 diabetes mellitus without complications: Secondary | ICD-10-CM | POA: Diagnosis not present

## 2022-09-16 DIAGNOSIS — J449 Chronic obstructive pulmonary disease, unspecified: Secondary | ICD-10-CM | POA: Diagnosis not present

## 2022-09-16 DIAGNOSIS — N390 Urinary tract infection, site not specified: Secondary | ICD-10-CM | POA: Diagnosis not present

## 2022-09-16 DIAGNOSIS — F0393 Unspecified dementia, unspecified severity, with mood disturbance: Secondary | ICD-10-CM | POA: Diagnosis not present

## 2022-09-16 DIAGNOSIS — I10 Essential (primary) hypertension: Secondary | ICD-10-CM | POA: Diagnosis not present

## 2022-09-16 DIAGNOSIS — J9611 Chronic respiratory failure with hypoxia: Secondary | ICD-10-CM | POA: Diagnosis not present

## 2022-09-17 DIAGNOSIS — E119 Type 2 diabetes mellitus without complications: Secondary | ICD-10-CM | POA: Diagnosis not present

## 2022-09-17 DIAGNOSIS — F0393 Unspecified dementia, unspecified severity, with mood disturbance: Secondary | ICD-10-CM | POA: Diagnosis not present

## 2022-09-17 DIAGNOSIS — J449 Chronic obstructive pulmonary disease, unspecified: Secondary | ICD-10-CM | POA: Diagnosis not present

## 2022-09-17 DIAGNOSIS — I10 Essential (primary) hypertension: Secondary | ICD-10-CM | POA: Diagnosis not present

## 2022-09-17 DIAGNOSIS — N390 Urinary tract infection, site not specified: Secondary | ICD-10-CM | POA: Diagnosis not present

## 2022-09-17 DIAGNOSIS — J9611 Chronic respiratory failure with hypoxia: Secondary | ICD-10-CM | POA: Diagnosis not present

## 2022-09-18 DIAGNOSIS — I10 Essential (primary) hypertension: Secondary | ICD-10-CM | POA: Diagnosis not present

## 2022-09-18 DIAGNOSIS — E119 Type 2 diabetes mellitus without complications: Secondary | ICD-10-CM | POA: Diagnosis not present

## 2022-09-18 DIAGNOSIS — J9611 Chronic respiratory failure with hypoxia: Secondary | ICD-10-CM | POA: Diagnosis not present

## 2022-09-18 DIAGNOSIS — N390 Urinary tract infection, site not specified: Secondary | ICD-10-CM | POA: Diagnosis not present

## 2022-09-18 DIAGNOSIS — F0393 Unspecified dementia, unspecified severity, with mood disturbance: Secondary | ICD-10-CM | POA: Diagnosis not present

## 2022-09-18 DIAGNOSIS — J449 Chronic obstructive pulmonary disease, unspecified: Secondary | ICD-10-CM | POA: Diagnosis not present

## 2022-09-19 DIAGNOSIS — J449 Chronic obstructive pulmonary disease, unspecified: Secondary | ICD-10-CM | POA: Diagnosis not present

## 2022-09-19 DIAGNOSIS — N390 Urinary tract infection, site not specified: Secondary | ICD-10-CM | POA: Diagnosis not present

## 2022-09-19 DIAGNOSIS — J9611 Chronic respiratory failure with hypoxia: Secondary | ICD-10-CM | POA: Diagnosis not present

## 2022-09-19 DIAGNOSIS — I10 Essential (primary) hypertension: Secondary | ICD-10-CM | POA: Diagnosis not present

## 2022-09-19 DIAGNOSIS — E119 Type 2 diabetes mellitus without complications: Secondary | ICD-10-CM | POA: Diagnosis not present

## 2022-09-19 DIAGNOSIS — F0393 Unspecified dementia, unspecified severity, with mood disturbance: Secondary | ICD-10-CM | POA: Diagnosis not present

## 2022-09-20 DIAGNOSIS — E119 Type 2 diabetes mellitus without complications: Secondary | ICD-10-CM | POA: Diagnosis not present

## 2022-09-20 DIAGNOSIS — J9611 Chronic respiratory failure with hypoxia: Secondary | ICD-10-CM | POA: Diagnosis not present

## 2022-09-20 DIAGNOSIS — I10 Essential (primary) hypertension: Secondary | ICD-10-CM | POA: Diagnosis not present

## 2022-09-20 DIAGNOSIS — N390 Urinary tract infection, site not specified: Secondary | ICD-10-CM | POA: Diagnosis not present

## 2022-09-20 DIAGNOSIS — J449 Chronic obstructive pulmonary disease, unspecified: Secondary | ICD-10-CM | POA: Diagnosis not present

## 2022-09-20 DIAGNOSIS — F0393 Unspecified dementia, unspecified severity, with mood disturbance: Secondary | ICD-10-CM | POA: Diagnosis not present

## 2022-09-21 DIAGNOSIS — J9611 Chronic respiratory failure with hypoxia: Secondary | ICD-10-CM | POA: Diagnosis not present

## 2022-09-21 DIAGNOSIS — J449 Chronic obstructive pulmonary disease, unspecified: Secondary | ICD-10-CM | POA: Diagnosis not present

## 2022-09-21 DIAGNOSIS — I10 Essential (primary) hypertension: Secondary | ICD-10-CM | POA: Diagnosis not present

## 2022-09-21 DIAGNOSIS — N390 Urinary tract infection, site not specified: Secondary | ICD-10-CM | POA: Diagnosis not present

## 2022-09-21 DIAGNOSIS — E119 Type 2 diabetes mellitus without complications: Secondary | ICD-10-CM | POA: Diagnosis not present

## 2022-09-21 DIAGNOSIS — F0393 Unspecified dementia, unspecified severity, with mood disturbance: Secondary | ICD-10-CM | POA: Diagnosis not present

## 2022-09-22 DIAGNOSIS — F0393 Unspecified dementia, unspecified severity, with mood disturbance: Secondary | ICD-10-CM | POA: Diagnosis not present

## 2022-09-22 DIAGNOSIS — E119 Type 2 diabetes mellitus without complications: Secondary | ICD-10-CM | POA: Diagnosis not present

## 2022-09-22 DIAGNOSIS — J449 Chronic obstructive pulmonary disease, unspecified: Secondary | ICD-10-CM | POA: Diagnosis not present

## 2022-09-22 DIAGNOSIS — J9611 Chronic respiratory failure with hypoxia: Secondary | ICD-10-CM | POA: Diagnosis not present

## 2022-09-22 DIAGNOSIS — I10 Essential (primary) hypertension: Secondary | ICD-10-CM | POA: Diagnosis not present

## 2022-09-22 DIAGNOSIS — N390 Urinary tract infection, site not specified: Secondary | ICD-10-CM | POA: Diagnosis not present

## 2022-09-23 DIAGNOSIS — J449 Chronic obstructive pulmonary disease, unspecified: Secondary | ICD-10-CM | POA: Diagnosis not present

## 2022-09-23 DIAGNOSIS — N390 Urinary tract infection, site not specified: Secondary | ICD-10-CM | POA: Diagnosis not present

## 2022-09-23 DIAGNOSIS — J9611 Chronic respiratory failure with hypoxia: Secondary | ICD-10-CM | POA: Diagnosis not present

## 2022-09-23 DIAGNOSIS — I10 Essential (primary) hypertension: Secondary | ICD-10-CM | POA: Diagnosis not present

## 2022-09-23 DIAGNOSIS — E119 Type 2 diabetes mellitus without complications: Secondary | ICD-10-CM | POA: Diagnosis not present

## 2022-09-23 DIAGNOSIS — F0393 Unspecified dementia, unspecified severity, with mood disturbance: Secondary | ICD-10-CM | POA: Diagnosis not present

## 2022-09-25 DIAGNOSIS — E119 Type 2 diabetes mellitus without complications: Secondary | ICD-10-CM | POA: Diagnosis not present

## 2022-09-25 DIAGNOSIS — N390 Urinary tract infection, site not specified: Secondary | ICD-10-CM | POA: Diagnosis not present

## 2022-09-25 DIAGNOSIS — J9611 Chronic respiratory failure with hypoxia: Secondary | ICD-10-CM | POA: Diagnosis not present

## 2022-09-25 DIAGNOSIS — J449 Chronic obstructive pulmonary disease, unspecified: Secondary | ICD-10-CM | POA: Diagnosis not present

## 2022-09-25 DIAGNOSIS — I10 Essential (primary) hypertension: Secondary | ICD-10-CM | POA: Diagnosis not present

## 2022-09-25 DIAGNOSIS — F0393 Unspecified dementia, unspecified severity, with mood disturbance: Secondary | ICD-10-CM | POA: Diagnosis not present

## 2022-09-29 DIAGNOSIS — E119 Type 2 diabetes mellitus without complications: Secondary | ICD-10-CM | POA: Diagnosis not present

## 2022-09-29 DIAGNOSIS — J9611 Chronic respiratory failure with hypoxia: Secondary | ICD-10-CM | POA: Diagnosis not present

## 2022-09-29 DIAGNOSIS — N390 Urinary tract infection, site not specified: Secondary | ICD-10-CM | POA: Diagnosis not present

## 2022-09-29 DIAGNOSIS — I10 Essential (primary) hypertension: Secondary | ICD-10-CM | POA: Diagnosis not present

## 2022-09-29 DIAGNOSIS — J449 Chronic obstructive pulmonary disease, unspecified: Secondary | ICD-10-CM | POA: Diagnosis not present

## 2022-09-29 DIAGNOSIS — F0393 Unspecified dementia, unspecified severity, with mood disturbance: Secondary | ICD-10-CM | POA: Diagnosis not present

## 2022-09-30 DIAGNOSIS — N390 Urinary tract infection, site not specified: Secondary | ICD-10-CM | POA: Diagnosis not present

## 2022-09-30 DIAGNOSIS — J449 Chronic obstructive pulmonary disease, unspecified: Secondary | ICD-10-CM | POA: Diagnosis not present

## 2022-09-30 DIAGNOSIS — E119 Type 2 diabetes mellitus without complications: Secondary | ICD-10-CM | POA: Diagnosis not present

## 2022-09-30 DIAGNOSIS — J9611 Chronic respiratory failure with hypoxia: Secondary | ICD-10-CM | POA: Diagnosis not present

## 2022-09-30 DIAGNOSIS — I10 Essential (primary) hypertension: Secondary | ICD-10-CM | POA: Diagnosis not present

## 2022-09-30 DIAGNOSIS — F0393 Unspecified dementia, unspecified severity, with mood disturbance: Secondary | ICD-10-CM | POA: Diagnosis not present

## 2022-10-02 DIAGNOSIS — J9611 Chronic respiratory failure with hypoxia: Secondary | ICD-10-CM | POA: Diagnosis not present

## 2022-10-02 DIAGNOSIS — J449 Chronic obstructive pulmonary disease, unspecified: Secondary | ICD-10-CM | POA: Diagnosis not present

## 2022-10-02 DIAGNOSIS — N390 Urinary tract infection, site not specified: Secondary | ICD-10-CM | POA: Diagnosis not present

## 2022-10-02 DIAGNOSIS — E119 Type 2 diabetes mellitus without complications: Secondary | ICD-10-CM | POA: Diagnosis not present

## 2022-10-02 DIAGNOSIS — I10 Essential (primary) hypertension: Secondary | ICD-10-CM | POA: Diagnosis not present

## 2022-10-02 DIAGNOSIS — F0393 Unspecified dementia, unspecified severity, with mood disturbance: Secondary | ICD-10-CM | POA: Diagnosis not present

## 2022-10-05 DIAGNOSIS — F0393 Unspecified dementia, unspecified severity, with mood disturbance: Secondary | ICD-10-CM | POA: Diagnosis not present

## 2022-10-05 DIAGNOSIS — N390 Urinary tract infection, site not specified: Secondary | ICD-10-CM | POA: Diagnosis not present

## 2022-10-05 DIAGNOSIS — E119 Type 2 diabetes mellitus without complications: Secondary | ICD-10-CM | POA: Diagnosis not present

## 2022-10-05 DIAGNOSIS — J449 Chronic obstructive pulmonary disease, unspecified: Secondary | ICD-10-CM | POA: Diagnosis not present

## 2022-10-05 DIAGNOSIS — J9611 Chronic respiratory failure with hypoxia: Secondary | ICD-10-CM | POA: Diagnosis not present

## 2022-10-05 DIAGNOSIS — I10 Essential (primary) hypertension: Secondary | ICD-10-CM | POA: Diagnosis not present

## 2022-10-06 DIAGNOSIS — E119 Type 2 diabetes mellitus without complications: Secondary | ICD-10-CM | POA: Diagnosis not present

## 2022-10-06 DIAGNOSIS — J9611 Chronic respiratory failure with hypoxia: Secondary | ICD-10-CM | POA: Diagnosis not present

## 2022-10-06 DIAGNOSIS — N390 Urinary tract infection, site not specified: Secondary | ICD-10-CM | POA: Diagnosis not present

## 2022-10-06 DIAGNOSIS — F0393 Unspecified dementia, unspecified severity, with mood disturbance: Secondary | ICD-10-CM | POA: Diagnosis not present

## 2022-10-06 DIAGNOSIS — I10 Essential (primary) hypertension: Secondary | ICD-10-CM | POA: Diagnosis not present

## 2022-10-06 DIAGNOSIS — J449 Chronic obstructive pulmonary disease, unspecified: Secondary | ICD-10-CM | POA: Diagnosis not present

## 2022-10-07 DIAGNOSIS — E119 Type 2 diabetes mellitus without complications: Secondary | ICD-10-CM | POA: Diagnosis not present

## 2022-10-07 DIAGNOSIS — N390 Urinary tract infection, site not specified: Secondary | ICD-10-CM | POA: Diagnosis not present

## 2022-10-07 DIAGNOSIS — F0393 Unspecified dementia, unspecified severity, with mood disturbance: Secondary | ICD-10-CM | POA: Diagnosis not present

## 2022-10-07 DIAGNOSIS — I10 Essential (primary) hypertension: Secondary | ICD-10-CM | POA: Diagnosis not present

## 2022-10-07 DIAGNOSIS — J9611 Chronic respiratory failure with hypoxia: Secondary | ICD-10-CM | POA: Diagnosis not present

## 2022-10-07 DIAGNOSIS — J449 Chronic obstructive pulmonary disease, unspecified: Secondary | ICD-10-CM | POA: Diagnosis not present

## 2022-10-09 DIAGNOSIS — N390 Urinary tract infection, site not specified: Secondary | ICD-10-CM | POA: Diagnosis not present

## 2022-10-09 DIAGNOSIS — I10 Essential (primary) hypertension: Secondary | ICD-10-CM | POA: Diagnosis not present

## 2022-10-09 DIAGNOSIS — J9611 Chronic respiratory failure with hypoxia: Secondary | ICD-10-CM | POA: Diagnosis not present

## 2022-10-09 DIAGNOSIS — F0393 Unspecified dementia, unspecified severity, with mood disturbance: Secondary | ICD-10-CM | POA: Diagnosis not present

## 2022-10-09 DIAGNOSIS — E119 Type 2 diabetes mellitus without complications: Secondary | ICD-10-CM | POA: Diagnosis not present

## 2022-10-09 DIAGNOSIS — J449 Chronic obstructive pulmonary disease, unspecified: Secondary | ICD-10-CM | POA: Diagnosis not present

## 2022-10-12 DIAGNOSIS — R159 Full incontinence of feces: Secondary | ICD-10-CM | POA: Diagnosis not present

## 2022-10-12 DIAGNOSIS — Z9981 Dependence on supplemental oxygen: Secondary | ICD-10-CM | POA: Diagnosis not present

## 2022-10-12 DIAGNOSIS — E119 Type 2 diabetes mellitus without complications: Secondary | ICD-10-CM | POA: Diagnosis not present

## 2022-10-12 DIAGNOSIS — K219 Gastro-esophageal reflux disease without esophagitis: Secondary | ICD-10-CM | POA: Diagnosis not present

## 2022-10-12 DIAGNOSIS — K2270 Barrett's esophagus without dysplasia: Secondary | ICD-10-CM | POA: Diagnosis not present

## 2022-10-12 DIAGNOSIS — J9611 Chronic respiratory failure with hypoxia: Secondary | ICD-10-CM | POA: Diagnosis not present

## 2022-10-12 DIAGNOSIS — N390 Urinary tract infection, site not specified: Secondary | ICD-10-CM | POA: Diagnosis not present

## 2022-10-12 DIAGNOSIS — J449 Chronic obstructive pulmonary disease, unspecified: Secondary | ICD-10-CM | POA: Diagnosis not present

## 2022-10-12 DIAGNOSIS — I10 Essential (primary) hypertension: Secondary | ICD-10-CM | POA: Diagnosis not present

## 2022-10-12 DIAGNOSIS — R32 Unspecified urinary incontinence: Secondary | ICD-10-CM | POA: Diagnosis not present

## 2022-10-12 DIAGNOSIS — E785 Hyperlipidemia, unspecified: Secondary | ICD-10-CM | POA: Diagnosis not present

## 2022-10-12 DIAGNOSIS — F0393 Unspecified dementia, unspecified severity, with mood disturbance: Secondary | ICD-10-CM | POA: Diagnosis not present

## 2022-10-13 DIAGNOSIS — E119 Type 2 diabetes mellitus without complications: Secondary | ICD-10-CM | POA: Diagnosis not present

## 2022-10-13 DIAGNOSIS — J9611 Chronic respiratory failure with hypoxia: Secondary | ICD-10-CM | POA: Diagnosis not present

## 2022-10-13 DIAGNOSIS — I10 Essential (primary) hypertension: Secondary | ICD-10-CM | POA: Diagnosis not present

## 2022-10-13 DIAGNOSIS — J449 Chronic obstructive pulmonary disease, unspecified: Secondary | ICD-10-CM | POA: Diagnosis not present

## 2022-10-13 DIAGNOSIS — F0393 Unspecified dementia, unspecified severity, with mood disturbance: Secondary | ICD-10-CM | POA: Diagnosis not present

## 2022-10-13 DIAGNOSIS — N390 Urinary tract infection, site not specified: Secondary | ICD-10-CM | POA: Diagnosis not present

## 2022-10-14 DIAGNOSIS — J449 Chronic obstructive pulmonary disease, unspecified: Secondary | ICD-10-CM | POA: Diagnosis not present

## 2022-10-14 DIAGNOSIS — F0393 Unspecified dementia, unspecified severity, with mood disturbance: Secondary | ICD-10-CM | POA: Diagnosis not present

## 2022-10-14 DIAGNOSIS — I10 Essential (primary) hypertension: Secondary | ICD-10-CM | POA: Diagnosis not present

## 2022-10-14 DIAGNOSIS — J9611 Chronic respiratory failure with hypoxia: Secondary | ICD-10-CM | POA: Diagnosis not present

## 2022-10-14 DIAGNOSIS — E119 Type 2 diabetes mellitus without complications: Secondary | ICD-10-CM | POA: Diagnosis not present

## 2022-10-14 DIAGNOSIS — N390 Urinary tract infection, site not specified: Secondary | ICD-10-CM | POA: Diagnosis not present

## 2022-10-15 DIAGNOSIS — N390 Urinary tract infection, site not specified: Secondary | ICD-10-CM | POA: Diagnosis not present

## 2022-10-15 DIAGNOSIS — J9611 Chronic respiratory failure with hypoxia: Secondary | ICD-10-CM | POA: Diagnosis not present

## 2022-10-15 DIAGNOSIS — E119 Type 2 diabetes mellitus without complications: Secondary | ICD-10-CM | POA: Diagnosis not present

## 2022-10-15 DIAGNOSIS — J449 Chronic obstructive pulmonary disease, unspecified: Secondary | ICD-10-CM | POA: Diagnosis not present

## 2022-10-15 DIAGNOSIS — F0393 Unspecified dementia, unspecified severity, with mood disturbance: Secondary | ICD-10-CM | POA: Diagnosis not present

## 2022-10-15 DIAGNOSIS — I10 Essential (primary) hypertension: Secondary | ICD-10-CM | POA: Diagnosis not present

## 2022-10-16 DIAGNOSIS — J449 Chronic obstructive pulmonary disease, unspecified: Secondary | ICD-10-CM | POA: Diagnosis not present

## 2022-10-16 DIAGNOSIS — F0393 Unspecified dementia, unspecified severity, with mood disturbance: Secondary | ICD-10-CM | POA: Diagnosis not present

## 2022-10-16 DIAGNOSIS — E119 Type 2 diabetes mellitus without complications: Secondary | ICD-10-CM | POA: Diagnosis not present

## 2022-10-16 DIAGNOSIS — J9611 Chronic respiratory failure with hypoxia: Secondary | ICD-10-CM | POA: Diagnosis not present

## 2022-10-16 DIAGNOSIS — I10 Essential (primary) hypertension: Secondary | ICD-10-CM | POA: Diagnosis not present

## 2022-10-16 DIAGNOSIS — N390 Urinary tract infection, site not specified: Secondary | ICD-10-CM | POA: Diagnosis not present

## 2022-10-17 DIAGNOSIS — J449 Chronic obstructive pulmonary disease, unspecified: Secondary | ICD-10-CM | POA: Diagnosis not present

## 2022-10-17 DIAGNOSIS — N390 Urinary tract infection, site not specified: Secondary | ICD-10-CM | POA: Diagnosis not present

## 2022-10-17 DIAGNOSIS — E119 Type 2 diabetes mellitus without complications: Secondary | ICD-10-CM | POA: Diagnosis not present

## 2022-10-17 DIAGNOSIS — J9611 Chronic respiratory failure with hypoxia: Secondary | ICD-10-CM | POA: Diagnosis not present

## 2022-10-17 DIAGNOSIS — F0393 Unspecified dementia, unspecified severity, with mood disturbance: Secondary | ICD-10-CM | POA: Diagnosis not present

## 2022-10-17 DIAGNOSIS — I10 Essential (primary) hypertension: Secondary | ICD-10-CM | POA: Diagnosis not present

## 2022-10-18 DIAGNOSIS — J9611 Chronic respiratory failure with hypoxia: Secondary | ICD-10-CM | POA: Diagnosis not present

## 2022-10-18 DIAGNOSIS — I10 Essential (primary) hypertension: Secondary | ICD-10-CM | POA: Diagnosis not present

## 2022-10-18 DIAGNOSIS — E119 Type 2 diabetes mellitus without complications: Secondary | ICD-10-CM | POA: Diagnosis not present

## 2022-10-18 DIAGNOSIS — N390 Urinary tract infection, site not specified: Secondary | ICD-10-CM | POA: Diagnosis not present

## 2022-10-18 DIAGNOSIS — J449 Chronic obstructive pulmonary disease, unspecified: Secondary | ICD-10-CM | POA: Diagnosis not present

## 2022-10-18 DIAGNOSIS — F0393 Unspecified dementia, unspecified severity, with mood disturbance: Secondary | ICD-10-CM | POA: Diagnosis not present

## 2022-10-19 ENCOUNTER — Other Ambulatory Visit: Payer: Self-pay | Admitting: Family Medicine

## 2022-10-19 DIAGNOSIS — J449 Chronic obstructive pulmonary disease, unspecified: Secondary | ICD-10-CM | POA: Diagnosis not present

## 2022-10-19 DIAGNOSIS — F0393 Unspecified dementia, unspecified severity, with mood disturbance: Secondary | ICD-10-CM | POA: Diagnosis not present

## 2022-10-19 DIAGNOSIS — I10 Essential (primary) hypertension: Secondary | ICD-10-CM | POA: Diagnosis not present

## 2022-10-19 DIAGNOSIS — E119 Type 2 diabetes mellitus without complications: Secondary | ICD-10-CM | POA: Diagnosis not present

## 2022-10-19 DIAGNOSIS — N390 Urinary tract infection, site not specified: Secondary | ICD-10-CM | POA: Diagnosis not present

## 2022-10-19 DIAGNOSIS — J9611 Chronic respiratory failure with hypoxia: Secondary | ICD-10-CM | POA: Diagnosis not present

## 2022-10-20 DIAGNOSIS — J9611 Chronic respiratory failure with hypoxia: Secondary | ICD-10-CM | POA: Diagnosis not present

## 2022-10-20 DIAGNOSIS — I10 Essential (primary) hypertension: Secondary | ICD-10-CM | POA: Diagnosis not present

## 2022-10-20 DIAGNOSIS — E119 Type 2 diabetes mellitus without complications: Secondary | ICD-10-CM | POA: Diagnosis not present

## 2022-10-20 DIAGNOSIS — N390 Urinary tract infection, site not specified: Secondary | ICD-10-CM | POA: Diagnosis not present

## 2022-10-20 DIAGNOSIS — F0393 Unspecified dementia, unspecified severity, with mood disturbance: Secondary | ICD-10-CM | POA: Diagnosis not present

## 2022-10-20 DIAGNOSIS — J449 Chronic obstructive pulmonary disease, unspecified: Secondary | ICD-10-CM | POA: Diagnosis not present

## 2022-10-23 DIAGNOSIS — N390 Urinary tract infection, site not specified: Secondary | ICD-10-CM | POA: Diagnosis not present

## 2022-10-23 DIAGNOSIS — I10 Essential (primary) hypertension: Secondary | ICD-10-CM | POA: Diagnosis not present

## 2022-10-23 DIAGNOSIS — J9611 Chronic respiratory failure with hypoxia: Secondary | ICD-10-CM | POA: Diagnosis not present

## 2022-10-23 DIAGNOSIS — F0393 Unspecified dementia, unspecified severity, with mood disturbance: Secondary | ICD-10-CM | POA: Diagnosis not present

## 2022-10-23 DIAGNOSIS — E119 Type 2 diabetes mellitus without complications: Secondary | ICD-10-CM | POA: Diagnosis not present

## 2022-10-23 DIAGNOSIS — J449 Chronic obstructive pulmonary disease, unspecified: Secondary | ICD-10-CM | POA: Diagnosis not present

## 2022-10-27 DIAGNOSIS — E119 Type 2 diabetes mellitus without complications: Secondary | ICD-10-CM | POA: Diagnosis not present

## 2022-10-27 DIAGNOSIS — I10 Essential (primary) hypertension: Secondary | ICD-10-CM | POA: Diagnosis not present

## 2022-10-27 DIAGNOSIS — J9611 Chronic respiratory failure with hypoxia: Secondary | ICD-10-CM | POA: Diagnosis not present

## 2022-10-27 DIAGNOSIS — F0393 Unspecified dementia, unspecified severity, with mood disturbance: Secondary | ICD-10-CM | POA: Diagnosis not present

## 2022-10-27 DIAGNOSIS — N390 Urinary tract infection, site not specified: Secondary | ICD-10-CM | POA: Diagnosis not present

## 2022-10-27 DIAGNOSIS — J449 Chronic obstructive pulmonary disease, unspecified: Secondary | ICD-10-CM | POA: Diagnosis not present

## 2022-10-27 NOTE — Telephone Encounter (Signed)
Patient's daughter Shanda Bumps [facility]) called to follow up on refill request for HYDROcodone-acetaminophen (NORCO/VICODIN) 5-325 MG tablet   Manson Allan stated all other meds were delivered yesterday except this one.   Pharmacy confirmed as:  [id number] - [place], [place] - [address] [place], [place] [address] Phone: [phone]  Fax: [phone] DEA #: [id number]   Requesting call when meds sent in. Please advise at [phone].

## 2022-10-28 DIAGNOSIS — F0393 Unspecified dementia, unspecified severity, with mood disturbance: Secondary | ICD-10-CM | POA: Diagnosis not present

## 2022-10-28 DIAGNOSIS — I10 Essential (primary) hypertension: Secondary | ICD-10-CM | POA: Diagnosis not present

## 2022-10-28 DIAGNOSIS — J9611 Chronic respiratory failure with hypoxia: Secondary | ICD-10-CM | POA: Diagnosis not present

## 2022-10-28 DIAGNOSIS — N390 Urinary tract infection, site not specified: Secondary | ICD-10-CM | POA: Diagnosis not present

## 2022-10-28 DIAGNOSIS — E119 Type 2 diabetes mellitus without complications: Secondary | ICD-10-CM | POA: Diagnosis not present

## 2022-10-28 DIAGNOSIS — J449 Chronic obstructive pulmonary disease, unspecified: Secondary | ICD-10-CM | POA: Diagnosis not present

## 2022-10-30 DIAGNOSIS — J9611 Chronic respiratory failure with hypoxia: Secondary | ICD-10-CM | POA: Diagnosis not present

## 2022-10-30 DIAGNOSIS — F0393 Unspecified dementia, unspecified severity, with mood disturbance: Secondary | ICD-10-CM | POA: Diagnosis not present

## 2022-10-30 DIAGNOSIS — N390 Urinary tract infection, site not specified: Secondary | ICD-10-CM | POA: Diagnosis not present

## 2022-10-30 DIAGNOSIS — I10 Essential (primary) hypertension: Secondary | ICD-10-CM | POA: Diagnosis not present

## 2022-10-30 DIAGNOSIS — E119 Type 2 diabetes mellitus without complications: Secondary | ICD-10-CM | POA: Diagnosis not present

## 2022-10-30 DIAGNOSIS — J449 Chronic obstructive pulmonary disease, unspecified: Secondary | ICD-10-CM | POA: Diagnosis not present

## 2022-11-04 DIAGNOSIS — I10 Essential (primary) hypertension: Secondary | ICD-10-CM | POA: Diagnosis not present

## 2022-11-04 DIAGNOSIS — N390 Urinary tract infection, site not specified: Secondary | ICD-10-CM | POA: Diagnosis not present

## 2022-11-04 DIAGNOSIS — E119 Type 2 diabetes mellitus without complications: Secondary | ICD-10-CM | POA: Diagnosis not present

## 2022-11-04 DIAGNOSIS — J449 Chronic obstructive pulmonary disease, unspecified: Secondary | ICD-10-CM | POA: Diagnosis not present

## 2022-11-04 DIAGNOSIS — J9611 Chronic respiratory failure with hypoxia: Secondary | ICD-10-CM | POA: Diagnosis not present

## 2022-11-04 DIAGNOSIS — F0393 Unspecified dementia, unspecified severity, with mood disturbance: Secondary | ICD-10-CM | POA: Diagnosis not present

## 2022-11-06 DIAGNOSIS — J449 Chronic obstructive pulmonary disease, unspecified: Secondary | ICD-10-CM | POA: Diagnosis not present

## 2022-11-06 DIAGNOSIS — N390 Urinary tract infection, site not specified: Secondary | ICD-10-CM | POA: Diagnosis not present

## 2022-11-06 DIAGNOSIS — J9611 Chronic respiratory failure with hypoxia: Secondary | ICD-10-CM | POA: Diagnosis not present

## 2022-11-06 DIAGNOSIS — E119 Type 2 diabetes mellitus without complications: Secondary | ICD-10-CM | POA: Diagnosis not present

## 2022-11-06 DIAGNOSIS — F0393 Unspecified dementia, unspecified severity, with mood disturbance: Secondary | ICD-10-CM | POA: Diagnosis not present

## 2022-11-06 DIAGNOSIS — I10 Essential (primary) hypertension: Secondary | ICD-10-CM | POA: Diagnosis not present

## 2022-11-10 DIAGNOSIS — N390 Urinary tract infection, site not specified: Secondary | ICD-10-CM | POA: Diagnosis not present

## 2022-11-10 DIAGNOSIS — J9611 Chronic respiratory failure with hypoxia: Secondary | ICD-10-CM | POA: Diagnosis not present

## 2022-11-10 DIAGNOSIS — I10 Essential (primary) hypertension: Secondary | ICD-10-CM | POA: Diagnosis not present

## 2022-11-10 DIAGNOSIS — E119 Type 2 diabetes mellitus without complications: Secondary | ICD-10-CM | POA: Diagnosis not present

## 2022-11-10 DIAGNOSIS — F0393 Unspecified dementia, unspecified severity, with mood disturbance: Secondary | ICD-10-CM | POA: Diagnosis not present

## 2022-11-10 DIAGNOSIS — J449 Chronic obstructive pulmonary disease, unspecified: Secondary | ICD-10-CM | POA: Diagnosis not present

## 2022-11-11 DIAGNOSIS — F0393 Unspecified dementia, unspecified severity, with mood disturbance: Secondary | ICD-10-CM | POA: Diagnosis not present

## 2022-11-11 DIAGNOSIS — I10 Essential (primary) hypertension: Secondary | ICD-10-CM | POA: Diagnosis not present

## 2022-11-11 DIAGNOSIS — J449 Chronic obstructive pulmonary disease, unspecified: Secondary | ICD-10-CM | POA: Diagnosis not present

## 2022-11-11 DIAGNOSIS — N390 Urinary tract infection, site not specified: Secondary | ICD-10-CM | POA: Diagnosis not present

## 2022-11-11 DIAGNOSIS — E119 Type 2 diabetes mellitus without complications: Secondary | ICD-10-CM | POA: Diagnosis not present

## 2022-11-11 DIAGNOSIS — J9611 Chronic respiratory failure with hypoxia: Secondary | ICD-10-CM | POA: Diagnosis not present

## 2022-11-12 DIAGNOSIS — K2270 Barrett's esophagus without dysplasia: Secondary | ICD-10-CM | POA: Diagnosis not present

## 2022-11-12 DIAGNOSIS — R159 Full incontinence of feces: Secondary | ICD-10-CM | POA: Diagnosis not present

## 2022-11-12 DIAGNOSIS — E119 Type 2 diabetes mellitus without complications: Secondary | ICD-10-CM | POA: Diagnosis not present

## 2022-11-12 DIAGNOSIS — I10 Essential (primary) hypertension: Secondary | ICD-10-CM | POA: Diagnosis not present

## 2022-11-12 DIAGNOSIS — R32 Unspecified urinary incontinence: Secondary | ICD-10-CM | POA: Diagnosis not present

## 2022-11-12 DIAGNOSIS — E785 Hyperlipidemia, unspecified: Secondary | ICD-10-CM | POA: Diagnosis not present

## 2022-11-12 DIAGNOSIS — Z9981 Dependence on supplemental oxygen: Secondary | ICD-10-CM | POA: Diagnosis not present

## 2022-11-12 DIAGNOSIS — N390 Urinary tract infection, site not specified: Secondary | ICD-10-CM | POA: Diagnosis not present

## 2022-11-12 DIAGNOSIS — K219 Gastro-esophageal reflux disease without esophagitis: Secondary | ICD-10-CM | POA: Diagnosis not present

## 2022-11-12 DIAGNOSIS — F0393 Unspecified dementia, unspecified severity, with mood disturbance: Secondary | ICD-10-CM | POA: Diagnosis not present

## 2022-11-12 DIAGNOSIS — J9611 Chronic respiratory failure with hypoxia: Secondary | ICD-10-CM | POA: Diagnosis not present

## 2022-11-12 DIAGNOSIS — J449 Chronic obstructive pulmonary disease, unspecified: Secondary | ICD-10-CM | POA: Diagnosis not present

## 2022-11-13 DIAGNOSIS — E119 Type 2 diabetes mellitus without complications: Secondary | ICD-10-CM | POA: Diagnosis not present

## 2022-11-13 DIAGNOSIS — I10 Essential (primary) hypertension: Secondary | ICD-10-CM | POA: Diagnosis not present

## 2022-11-13 DIAGNOSIS — J9611 Chronic respiratory failure with hypoxia: Secondary | ICD-10-CM | POA: Diagnosis not present

## 2022-11-13 DIAGNOSIS — J449 Chronic obstructive pulmonary disease, unspecified: Secondary | ICD-10-CM | POA: Diagnosis not present

## 2022-11-13 DIAGNOSIS — F0393 Unspecified dementia, unspecified severity, with mood disturbance: Secondary | ICD-10-CM | POA: Diagnosis not present

## 2022-11-13 DIAGNOSIS — N390 Urinary tract infection, site not specified: Secondary | ICD-10-CM | POA: Diagnosis not present

## 2022-11-14 DIAGNOSIS — F0393 Unspecified dementia, unspecified severity, with mood disturbance: Secondary | ICD-10-CM | POA: Diagnosis not present

## 2022-11-14 DIAGNOSIS — I10 Essential (primary) hypertension: Secondary | ICD-10-CM | POA: Diagnosis not present

## 2022-11-14 DIAGNOSIS — J9611 Chronic respiratory failure with hypoxia: Secondary | ICD-10-CM | POA: Diagnosis not present

## 2022-11-14 DIAGNOSIS — J449 Chronic obstructive pulmonary disease, unspecified: Secondary | ICD-10-CM | POA: Diagnosis not present

## 2022-11-14 DIAGNOSIS — E119 Type 2 diabetes mellitus without complications: Secondary | ICD-10-CM | POA: Diagnosis not present

## 2022-11-14 DIAGNOSIS — N390 Urinary tract infection, site not specified: Secondary | ICD-10-CM | POA: Diagnosis not present

## 2022-11-15 DIAGNOSIS — J9611 Chronic respiratory failure with hypoxia: Secondary | ICD-10-CM | POA: Diagnosis not present

## 2022-11-15 DIAGNOSIS — N390 Urinary tract infection, site not specified: Secondary | ICD-10-CM | POA: Diagnosis not present

## 2022-11-15 DIAGNOSIS — J449 Chronic obstructive pulmonary disease, unspecified: Secondary | ICD-10-CM | POA: Diagnosis not present

## 2022-11-15 DIAGNOSIS — E119 Type 2 diabetes mellitus without complications: Secondary | ICD-10-CM | POA: Diagnosis not present

## 2022-11-15 DIAGNOSIS — F0393 Unspecified dementia, unspecified severity, with mood disturbance: Secondary | ICD-10-CM | POA: Diagnosis not present

## 2022-11-15 DIAGNOSIS — I10 Essential (primary) hypertension: Secondary | ICD-10-CM | POA: Diagnosis not present

## 2022-11-16 DIAGNOSIS — I10 Essential (primary) hypertension: Secondary | ICD-10-CM | POA: Diagnosis not present

## 2022-11-16 DIAGNOSIS — J449 Chronic obstructive pulmonary disease, unspecified: Secondary | ICD-10-CM | POA: Diagnosis not present

## 2022-11-16 DIAGNOSIS — N390 Urinary tract infection, site not specified: Secondary | ICD-10-CM | POA: Diagnosis not present

## 2022-11-16 DIAGNOSIS — F0393 Unspecified dementia, unspecified severity, with mood disturbance: Secondary | ICD-10-CM | POA: Diagnosis not present

## 2022-11-16 DIAGNOSIS — E119 Type 2 diabetes mellitus without complications: Secondary | ICD-10-CM | POA: Diagnosis not present

## 2022-11-16 DIAGNOSIS — J9611 Chronic respiratory failure with hypoxia: Secondary | ICD-10-CM | POA: Diagnosis not present

## 2022-11-17 DIAGNOSIS — I10 Essential (primary) hypertension: Secondary | ICD-10-CM | POA: Diagnosis not present

## 2022-11-17 DIAGNOSIS — E119 Type 2 diabetes mellitus without complications: Secondary | ICD-10-CM | POA: Diagnosis not present

## 2022-11-17 DIAGNOSIS — J9611 Chronic respiratory failure with hypoxia: Secondary | ICD-10-CM | POA: Diagnosis not present

## 2022-11-17 DIAGNOSIS — N390 Urinary tract infection, site not specified: Secondary | ICD-10-CM | POA: Diagnosis not present

## 2022-11-17 DIAGNOSIS — F0393 Unspecified dementia, unspecified severity, with mood disturbance: Secondary | ICD-10-CM | POA: Diagnosis not present

## 2022-11-17 DIAGNOSIS — J449 Chronic obstructive pulmonary disease, unspecified: Secondary | ICD-10-CM | POA: Diagnosis not present

## 2022-11-20 ENCOUNTER — Other Ambulatory Visit: Payer: Self-pay | Admitting: Family Medicine

## 2022-11-20 DIAGNOSIS — J9611 Chronic respiratory failure with hypoxia: Secondary | ICD-10-CM | POA: Diagnosis not present

## 2022-11-20 DIAGNOSIS — E119 Type 2 diabetes mellitus without complications: Secondary | ICD-10-CM

## 2022-11-20 DIAGNOSIS — N390 Urinary tract infection, site not specified: Secondary | ICD-10-CM | POA: Diagnosis not present

## 2022-11-20 DIAGNOSIS — I10 Essential (primary) hypertension: Secondary | ICD-10-CM | POA: Diagnosis not present

## 2022-11-20 DIAGNOSIS — F0393 Unspecified dementia, unspecified severity, with mood disturbance: Secondary | ICD-10-CM | POA: Diagnosis not present

## 2022-11-20 DIAGNOSIS — J449 Chronic obstructive pulmonary disease, unspecified: Secondary | ICD-10-CM | POA: Diagnosis not present

## 2022-11-23 NOTE — Telephone Encounter (Signed)
Requested Prescriptions  Pending Prescriptions Disp Refills   metFORMIN (GLUCOPHAGE) 1000 MG tablet [Pharmacy Med Name: metformin 1,000 mg tablet] 60 tablet 0    Sig: TAKE ONE TABLET BY MOUTH TWICE DAILY WITH A MEAL     Endocrinology:  Diabetes - Biguanides Failed - 11/20/2022  1:52 PM      Failed - HBA1C is between 0 and 7.9 and within 180 days    Hgb A1c MFr Bld  Date Value Ref Range Status  01/22/2022 7.4 (H) <5.7 % of total Hgb Final    Comment:    For someone without known diabetes, a hemoglobin A1c value of 6.5% or greater indicates that they may have  diabetes and this should be confirmed with a follow-up  test. . For someone with known diabetes, a value <7% indicates  that their diabetes is well controlled and a value  greater than or equal to 7% indicates suboptimal  control. A1c targets should be individualized based on  duration of diabetes, age, comorbid conditions, and  other considerations. . Currently, no consensus exists regarding use of hemoglobin A1c for diagnosis of diabetes for children. .          Failed - B12 Level in normal range and within 720 days    Vitamin B-12  Date Value Ref Range Status  03/16/2020 632 180 - 914 pg/mL Final    Comment:    (NOTE) This assay is not validated for testing neonatal or myeloproliferative syndrome specimens for Vitamin B12 levels. Performed at [facility], [address], [place], [place] [address]          Failed - Valid encounter within last 6 months    Recent Outpatient Visits           1 year ago Community acquired pneumonia of left lower lobe of lung   [facility] Pickard, Priscille Heidelberg, MD   1 year ago Diabetes mellitus type 2 in nonobese [facility])   [facility] Donita Brooks, MD   2 years ago Panlobular emphysema (HCC)   [facility] Donita Brooks, MD   2 years ago Shortness of breath   [facility] Pickard, Priscille Heidelberg,  MD   2 years ago Chronic respiratory failure with hypoxia [facility])   [facility] Pickard, Priscille Heidelberg, MD              Passed - Cr in normal range and within 360 days    Creat  Date Value Ref Range Status  01/22/2022 0.65 0.60 - 0.95 mg/dL Final   Creatinine, Ser  Date Value Ref Range Status  06/10/2022 0.84 0.44 - 1.00 mg/dL Final         Passed - eGFR in normal range and within 360 days    GFR, Est African American  Date Value Ref Range Status  12/01/2019 101 > OR = 60 mL/min/1.49m2 Final   GFR, Est Non African American  Date Value Ref Range Status  12/01/2019 87 > OR = 60 mL/min/1.40m2 Final   GFR, Estimated  Date Value Ref Range Status  06/10/2022 >60 >60 mL/min Final    Comment:    (NOTE) Calculated using the CKD-EPI Creatinine Equation (2021)    eGFR  Date Value Ref Range Status  01/22/2022 89 > OR = 60 mL/min/1.77m2 Final         Passed - CBC within normal limits and completed in the last 12 months  WBC  Date Value Ref Range Status  06/10/2022 9.0 4.0 - 10.5 K/uL Final   RBC  Date Value Ref Range Status  06/10/2022 4.54 3.87 - 5.11 MIL/uL Final   Hemoglobin  Date Value Ref Range Status  06/10/2022 12.9 12.0 - 15.0 g/dL Final   HCT  Date Value Ref Range Status  06/10/2022 42.6 36.0 - 46.0 % Final   MCHC  Date Value Ref Range Status  06/10/2022 30.3 30.0 - 36.0 g/dL Final   [facility]  Date Value Ref Range Status  06/10/2022 28.4 26.0 - 34.0 pg Final   MCV  Date Value Ref Range Status  06/10/2022 93.8 80.0 - 100.0 fL Final   No results found for: "PLTCOUNTKUC", "LABPLAT", "POCPLA" RDW  Date Value Ref Range Status  06/10/2022 15.1 11.5 - 15.5 % Final          omeprazole (PRILOSEC) 20 MG capsule [Pharmacy Med Name: omeprazole 20 mg capsule,delayed release] 30 capsule 0    Sig: TAKE ONE CAPSULE BY MOUTH TWICE DAILY     Gastroenterology: Proton Pump Inhibitors Failed - 11/20/2022  1:52 PM      Failed - Valid encounter within last  12 months    Recent Outpatient Visits           1 year ago Community acquired pneumonia of left lower lobe of lung   [facility] Pickard, Priscille Heidelberg, MD   1 year ago Diabetes mellitus type 2 in nonobese [facility])   [facility] Donita Brooks, MD   2 years ago Panlobular emphysema (HCC)   [facility] Donita Brooks, MD   2 years ago Shortness of breath   [facility] Donita Brooks, MD   2 years ago Chronic respiratory failure with hypoxia (HCC)   [facility] Pickard, Priscille Heidelberg, MD               losartan (COZAAR) 50 MG tablet [Pharmacy Med Name: losartan 50 mg tablet] 30 tablet 0    Sig: TAKE ONE TABLET BY MOUTH EVERY MORNING     Cardiovascular:  Angiotensin Receptor Blockers Failed - 11/20/2022  1:52 PM      Failed - Last BP in normal range    BP Readings from Last 1 Encounters:  06/10/22 (!) 147/77         Failed - Valid encounter within last 6 months    Recent Outpatient Visits           1 year ago Community acquired pneumonia of left lower lobe of lung   [facility] Donita Brooks, MD   1 year ago Diabetes mellitus type 2 in nonobese [facility])   [facility] Donita Brooks, MD   2 years ago Panlobular emphysema [facility])   [facility] Donita Brooks, MD   2 years ago Shortness of breath   [facility] Donita Brooks, MD   2 years ago Chronic respiratory failure with hypoxia [facility])   [facility] Pickard, Priscille Heidelberg, MD              Passed - Cr in normal range and within 180 days    Creat  Date Value Ref Range Status  01/22/2022 0.65 0.60 - 0.95 mg/dL Final   Creatinine, Ser  Date Value Ref Range Status  06/10/2022 0.84 0.44 - 1.00 mg/dL Final  Passed - K in normal range and within 180 days    Potassium  Date Value Ref Range Status  06/10/2022 4.0 3.5 - 5.1 mmol/L Final          Passed - Patient is not pregnant       glipiZIDE (GLUCOTROL XL) 5 MG 24 hr tablet [Pharmacy Med Name: glipizide ER 5 mg tablet, extended release 24 hr] 30 tablet 0    Sig: TAKE ONE TABLET BY MOUTH EVERY MORNING     Endocrinology:  Diabetes - Sulfonylureas Failed - 11/20/2022  1:52 PM      Failed - HBA1C is between 0 and 7.9 and within 180 days    Hgb A1c MFr Bld  Date Value Ref Range Status  01/22/2022 7.4 (H) <5.7 % of total Hgb Final    Comment:    For someone without known diabetes, a hemoglobin A1c value of 6.5% or greater indicates that they may have  diabetes and this should be confirmed with a follow-up  test. . For someone with known diabetes, a value <7% indicates  that their diabetes is well controlled and a value  greater than or equal to 7% indicates suboptimal  control. A1c targets should be individualized based on  duration of diabetes, age, comorbid conditions, and  other considerations. . Currently, no consensus exists regarding use of hemoglobin A1c for diagnosis of diabetes for children. .          Failed - Valid encounter within last 6 months    Recent Outpatient Visits           1 year ago Community acquired pneumonia of left lower lobe of lung   [facility] Pickard, Priscille Heidelberg, MD   1 year ago Diabetes mellitus type 2 in nonobese [facility])   [facility] Donita Brooks, MD   2 years ago Panlobular emphysema [facility])   [facility] Donita Brooks, MD   2 years ago Shortness of breath   [facility] Donita Brooks, MD   2 years ago Chronic respiratory failure with hypoxia [facility])   [facility] Pickard, Priscille Heidelberg, MD              Passed - Cr in normal range and within 360 days    Creat  Date Value Ref Range Status  01/22/2022 0.65 0.60 - 0.95 mg/dL Final   Creatinine, Ser  Date Value Ref Range Status  06/10/2022 0.84 0.44 - 1.00 mg/dL Final

## 2022-11-24 DIAGNOSIS — F0393 Unspecified dementia, unspecified severity, with mood disturbance: Secondary | ICD-10-CM | POA: Diagnosis not present

## 2022-11-24 DIAGNOSIS — I10 Essential (primary) hypertension: Secondary | ICD-10-CM | POA: Diagnosis not present

## 2022-11-24 DIAGNOSIS — J449 Chronic obstructive pulmonary disease, unspecified: Secondary | ICD-10-CM | POA: Diagnosis not present

## 2022-11-24 DIAGNOSIS — J9611 Chronic respiratory failure with hypoxia: Secondary | ICD-10-CM | POA: Diagnosis not present

## 2022-11-24 DIAGNOSIS — N390 Urinary tract infection, site not specified: Secondary | ICD-10-CM | POA: Diagnosis not present

## 2022-11-24 DIAGNOSIS — E119 Type 2 diabetes mellitus without complications: Secondary | ICD-10-CM | POA: Diagnosis not present

## 2022-11-26 DIAGNOSIS — J449 Chronic obstructive pulmonary disease, unspecified: Secondary | ICD-10-CM | POA: Diagnosis not present

## 2022-11-26 DIAGNOSIS — N390 Urinary tract infection, site not specified: Secondary | ICD-10-CM | POA: Diagnosis not present

## 2022-11-26 DIAGNOSIS — I10 Essential (primary) hypertension: Secondary | ICD-10-CM | POA: Diagnosis not present

## 2022-11-26 DIAGNOSIS — F0393 Unspecified dementia, unspecified severity, with mood disturbance: Secondary | ICD-10-CM | POA: Diagnosis not present

## 2022-11-26 DIAGNOSIS — J9611 Chronic respiratory failure with hypoxia: Secondary | ICD-10-CM | POA: Diagnosis not present

## 2022-11-26 DIAGNOSIS — E119 Type 2 diabetes mellitus without complications: Secondary | ICD-10-CM | POA: Diagnosis not present

## 2022-11-27 DIAGNOSIS — E119 Type 2 diabetes mellitus without complications: Secondary | ICD-10-CM | POA: Diagnosis not present

## 2022-11-27 DIAGNOSIS — F0393 Unspecified dementia, unspecified severity, with mood disturbance: Secondary | ICD-10-CM | POA: Diagnosis not present

## 2022-11-27 DIAGNOSIS — J449 Chronic obstructive pulmonary disease, unspecified: Secondary | ICD-10-CM | POA: Diagnosis not present

## 2022-11-27 DIAGNOSIS — J9611 Chronic respiratory failure with hypoxia: Secondary | ICD-10-CM | POA: Diagnosis not present

## 2022-11-27 DIAGNOSIS — I10 Essential (primary) hypertension: Secondary | ICD-10-CM | POA: Diagnosis not present

## 2022-11-27 DIAGNOSIS — N390 Urinary tract infection, site not specified: Secondary | ICD-10-CM | POA: Diagnosis not present

## 2022-12-01 DIAGNOSIS — J449 Chronic obstructive pulmonary disease, unspecified: Secondary | ICD-10-CM | POA: Diagnosis not present

## 2022-12-01 DIAGNOSIS — N390 Urinary tract infection, site not specified: Secondary | ICD-10-CM | POA: Diagnosis not present

## 2022-12-01 DIAGNOSIS — E119 Type 2 diabetes mellitus without complications: Secondary | ICD-10-CM | POA: Diagnosis not present

## 2022-12-01 DIAGNOSIS — F0393 Unspecified dementia, unspecified severity, with mood disturbance: Secondary | ICD-10-CM | POA: Diagnosis not present

## 2022-12-01 DIAGNOSIS — I10 Essential (primary) hypertension: Secondary | ICD-10-CM | POA: Diagnosis not present

## 2022-12-01 DIAGNOSIS — J9611 Chronic respiratory failure with hypoxia: Secondary | ICD-10-CM | POA: Diagnosis not present

## 2022-12-02 DIAGNOSIS — E119 Type 2 diabetes mellitus without complications: Secondary | ICD-10-CM | POA: Diagnosis not present

## 2022-12-02 DIAGNOSIS — F0393 Unspecified dementia, unspecified severity, with mood disturbance: Secondary | ICD-10-CM | POA: Diagnosis not present

## 2022-12-02 DIAGNOSIS — N390 Urinary tract infection, site not specified: Secondary | ICD-10-CM | POA: Diagnosis not present

## 2022-12-02 DIAGNOSIS — I10 Essential (primary) hypertension: Secondary | ICD-10-CM | POA: Diagnosis not present

## 2022-12-02 DIAGNOSIS — J9611 Chronic respiratory failure with hypoxia: Secondary | ICD-10-CM | POA: Diagnosis not present

## 2022-12-02 DIAGNOSIS — J449 Chronic obstructive pulmonary disease, unspecified: Secondary | ICD-10-CM | POA: Diagnosis not present

## 2022-12-04 DIAGNOSIS — F0393 Unspecified dementia, unspecified severity, with mood disturbance: Secondary | ICD-10-CM | POA: Diagnosis not present

## 2022-12-04 DIAGNOSIS — J9611 Chronic respiratory failure with hypoxia: Secondary | ICD-10-CM | POA: Diagnosis not present

## 2022-12-04 DIAGNOSIS — E119 Type 2 diabetes mellitus without complications: Secondary | ICD-10-CM | POA: Diagnosis not present

## 2022-12-04 DIAGNOSIS — N390 Urinary tract infection, site not specified: Secondary | ICD-10-CM | POA: Diagnosis not present

## 2022-12-04 DIAGNOSIS — I10 Essential (primary) hypertension: Secondary | ICD-10-CM | POA: Diagnosis not present

## 2022-12-04 DIAGNOSIS — J449 Chronic obstructive pulmonary disease, unspecified: Secondary | ICD-10-CM | POA: Diagnosis not present

## 2022-12-08 DIAGNOSIS — J449 Chronic obstructive pulmonary disease, unspecified: Secondary | ICD-10-CM | POA: Diagnosis not present

## 2022-12-08 DIAGNOSIS — J9611 Chronic respiratory failure with hypoxia: Secondary | ICD-10-CM | POA: Diagnosis not present

## 2022-12-08 DIAGNOSIS — E119 Type 2 diabetes mellitus without complications: Secondary | ICD-10-CM | POA: Diagnosis not present

## 2022-12-08 DIAGNOSIS — I10 Essential (primary) hypertension: Secondary | ICD-10-CM | POA: Diagnosis not present

## 2022-12-08 DIAGNOSIS — F0393 Unspecified dementia, unspecified severity, with mood disturbance: Secondary | ICD-10-CM | POA: Diagnosis not present

## 2022-12-08 DIAGNOSIS — N390 Urinary tract infection, site not specified: Secondary | ICD-10-CM | POA: Diagnosis not present

## 2022-12-09 DIAGNOSIS — N390 Urinary tract infection, site not specified: Secondary | ICD-10-CM | POA: Diagnosis not present

## 2022-12-09 DIAGNOSIS — E119 Type 2 diabetes mellitus without complications: Secondary | ICD-10-CM | POA: Diagnosis not present

## 2022-12-09 DIAGNOSIS — J9611 Chronic respiratory failure with hypoxia: Secondary | ICD-10-CM | POA: Diagnosis not present

## 2022-12-09 DIAGNOSIS — J449 Chronic obstructive pulmonary disease, unspecified: Secondary | ICD-10-CM | POA: Diagnosis not present

## 2022-12-09 DIAGNOSIS — F0393 Unspecified dementia, unspecified severity, with mood disturbance: Secondary | ICD-10-CM | POA: Diagnosis not present

## 2022-12-09 DIAGNOSIS — I10 Essential (primary) hypertension: Secondary | ICD-10-CM | POA: Diagnosis not present

## 2022-12-10 ENCOUNTER — Other Ambulatory Visit: Payer: Self-pay | Admitting: Family Medicine

## 2022-12-10 ENCOUNTER — Other Ambulatory Visit: Payer: Self-pay

## 2022-12-10 ENCOUNTER — Telehealth: Payer: Self-pay | Admitting: Family Medicine

## 2022-12-10 DIAGNOSIS — F0390 Unspecified dementia without behavioral disturbance: Secondary | ICD-10-CM

## 2022-12-10 DIAGNOSIS — R630 Anorexia: Secondary | ICD-10-CM

## 2022-12-10 DIAGNOSIS — J441 Chronic obstructive pulmonary disease with (acute) exacerbation: Secondary | ICD-10-CM

## 2022-12-10 DIAGNOSIS — J431 Panlobular emphysema: Secondary | ICD-10-CM

## 2022-12-10 DIAGNOSIS — F32A Depression, unspecified: Secondary | ICD-10-CM

## 2022-12-10 DIAGNOSIS — K219 Gastro-esophageal reflux disease without esophagitis: Secondary | ICD-10-CM

## 2022-12-10 DIAGNOSIS — I10 Essential (primary) hypertension: Secondary | ICD-10-CM

## 2022-12-10 DIAGNOSIS — E119 Type 2 diabetes mellitus without complications: Secondary | ICD-10-CM

## 2022-12-10 MED ORDER — OMEPRAZOLE 20 MG PO CPDR
DELAYED_RELEASE_CAPSULE | ORAL | 1 refills | Status: AC
Start: 2022-12-10 — End: ?

## 2022-12-10 MED ORDER — GLIPIZIDE ER 5 MG PO TB24
5.0000 mg | ORAL_TABLET | Freq: Every morning | ORAL | 1 refills | Status: AC
Start: 2022-12-10 — End: ?

## 2022-12-10 MED ORDER — ESCITALOPRAM OXALATE 10 MG PO TABS
10.0000 mg | ORAL_TABLET | Freq: Every morning | ORAL | 1 refills | Status: AC
Start: 2022-12-10 — End: ?

## 2022-12-10 MED ORDER — LOSARTAN POTASSIUM 50 MG PO TABS
50.0000 mg | ORAL_TABLET | Freq: Every morning | ORAL | 1 refills | Status: AC
Start: 2022-12-10 — End: ?

## 2022-12-10 MED ORDER — HYDROCODONE-ACETAMINOPHEN 5-325 MG PO TABS: ORAL_TABLET | ORAL | 0 refills | Status: AC

## 2022-12-10 NOTE — Telephone Encounter (Signed)
Prescription Request  12/10/2022  LOV: 01/22/2022  What is the name of the medication or equipment?   HYDROcodone-acetaminophen (NORCO/VICODIN) 5-325 MG tablet   losartan (COZAAR) 50 MG tablet [[id number]]    glipiZIDE (GLUCOTROL XL) 5 MG 24 hr tablet [[id number]]   escitalopram (LEXAPRO) 10 MG tablet [[id number]]   omeprazole (PRILOSEC) 20 MG capsule [[id number]]   Have you contacted your pharmacy to request a refill? Yes   Which pharmacy would you like this sent to?  [facility] - [place], [place] - [address] [place] [address] Phone: [phone] Fax: [phone]    Patient notified that their request is being sent to the clinical staff for review and that they should receive a response within 2 business days.   Please advise pharmacist

## 2022-12-10 NOTE — Telephone Encounter (Signed)
Pharmacy sent separate script to also request refill of glipiZIDE (GLUCOTROL XL) 5 MG 24 hr tablet [[id number]]   Please see previous message in thread.

## 2022-12-10 NOTE — Telephone Encounter (Signed)
Prescription Request  12/10/2022  LOV: 01/22/2022  What is the name of the medication or equipment?   HYDROcodone-acetaminophen (NORCO/VICODIN) 5-325 MG tablet   escitalopram (LEXAPRO) 10 MG tablet [[id number]]  **Pharmacy requesting for refills to be faxed**  Have you contacted your pharmacy to request a refill? Yes   Which pharmacy would you like this sent to?  [facility] - [place], [place] - [address] [place] [address] Phone: [phone] Fax: [phone]    Patient notified that their request is being sent to the clinical staff for review and that they should receive a response within 2 business days.   Please advise pharmacist

## 2022-12-11 DIAGNOSIS — I10 Essential (primary) hypertension: Secondary | ICD-10-CM | POA: Diagnosis not present

## 2022-12-11 DIAGNOSIS — F0393 Unspecified dementia, unspecified severity, with mood disturbance: Secondary | ICD-10-CM | POA: Diagnosis not present

## 2022-12-11 DIAGNOSIS — N390 Urinary tract infection, site not specified: Secondary | ICD-10-CM | POA: Diagnosis not present

## 2022-12-11 DIAGNOSIS — J9611 Chronic respiratory failure with hypoxia: Secondary | ICD-10-CM | POA: Diagnosis not present

## 2022-12-11 DIAGNOSIS — J449 Chronic obstructive pulmonary disease, unspecified: Secondary | ICD-10-CM | POA: Diagnosis not present

## 2022-12-11 DIAGNOSIS — E119 Type 2 diabetes mellitus without complications: Secondary | ICD-10-CM | POA: Diagnosis not present

## 2022-12-13 DIAGNOSIS — J9611 Chronic respiratory failure with hypoxia: Secondary | ICD-10-CM | POA: Diagnosis not present

## 2022-12-13 DIAGNOSIS — J449 Chronic obstructive pulmonary disease, unspecified: Secondary | ICD-10-CM | POA: Diagnosis not present

## 2022-12-13 DIAGNOSIS — N390 Urinary tract infection, site not specified: Secondary | ICD-10-CM | POA: Diagnosis not present

## 2022-12-13 DIAGNOSIS — K219 Gastro-esophageal reflux disease without esophagitis: Secondary | ICD-10-CM | POA: Diagnosis not present

## 2022-12-13 DIAGNOSIS — I10 Essential (primary) hypertension: Secondary | ICD-10-CM | POA: Diagnosis not present

## 2022-12-13 DIAGNOSIS — R32 Unspecified urinary incontinence: Secondary | ICD-10-CM | POA: Diagnosis not present

## 2022-12-13 DIAGNOSIS — K2270 Barrett's esophagus without dysplasia: Secondary | ICD-10-CM | POA: Diagnosis not present

## 2022-12-13 DIAGNOSIS — F0393 Unspecified dementia, unspecified severity, with mood disturbance: Secondary | ICD-10-CM | POA: Diagnosis not present

## 2022-12-13 DIAGNOSIS — Z9981 Dependence on supplemental oxygen: Secondary | ICD-10-CM | POA: Diagnosis not present

## 2022-12-13 DIAGNOSIS — E785 Hyperlipidemia, unspecified: Secondary | ICD-10-CM | POA: Diagnosis not present

## 2022-12-13 DIAGNOSIS — E119 Type 2 diabetes mellitus without complications: Secondary | ICD-10-CM | POA: Diagnosis not present

## 2022-12-13 DIAGNOSIS — R159 Full incontinence of feces: Secondary | ICD-10-CM | POA: Diagnosis not present

## 2022-12-14 DIAGNOSIS — E119 Type 2 diabetes mellitus without complications: Secondary | ICD-10-CM | POA: Diagnosis not present

## 2022-12-14 DIAGNOSIS — F0393 Unspecified dementia, unspecified severity, with mood disturbance: Secondary | ICD-10-CM | POA: Diagnosis not present

## 2022-12-14 DIAGNOSIS — I10 Essential (primary) hypertension: Secondary | ICD-10-CM | POA: Diagnosis not present

## 2022-12-14 DIAGNOSIS — J9611 Chronic respiratory failure with hypoxia: Secondary | ICD-10-CM | POA: Diagnosis not present

## 2022-12-14 DIAGNOSIS — N390 Urinary tract infection, site not specified: Secondary | ICD-10-CM | POA: Diagnosis not present

## 2022-12-14 DIAGNOSIS — J449 Chronic obstructive pulmonary disease, unspecified: Secondary | ICD-10-CM | POA: Diagnosis not present

## 2022-12-15 DIAGNOSIS — I10 Essential (primary) hypertension: Secondary | ICD-10-CM | POA: Diagnosis not present

## 2022-12-15 DIAGNOSIS — J449 Chronic obstructive pulmonary disease, unspecified: Secondary | ICD-10-CM | POA: Diagnosis not present

## 2022-12-15 DIAGNOSIS — J9611 Chronic respiratory failure with hypoxia: Secondary | ICD-10-CM | POA: Diagnosis not present

## 2022-12-15 DIAGNOSIS — F0393 Unspecified dementia, unspecified severity, with mood disturbance: Secondary | ICD-10-CM | POA: Diagnosis not present

## 2022-12-15 DIAGNOSIS — N390 Urinary tract infection, site not specified: Secondary | ICD-10-CM | POA: Diagnosis not present

## 2022-12-15 DIAGNOSIS — E119 Type 2 diabetes mellitus without complications: Secondary | ICD-10-CM | POA: Diagnosis not present

## 2022-12-16 DIAGNOSIS — N390 Urinary tract infection, site not specified: Secondary | ICD-10-CM | POA: Diagnosis not present

## 2022-12-16 DIAGNOSIS — J9611 Chronic respiratory failure with hypoxia: Secondary | ICD-10-CM | POA: Diagnosis not present

## 2022-12-16 DIAGNOSIS — E119 Type 2 diabetes mellitus without complications: Secondary | ICD-10-CM | POA: Diagnosis not present

## 2022-12-16 DIAGNOSIS — I10 Essential (primary) hypertension: Secondary | ICD-10-CM | POA: Diagnosis not present

## 2022-12-16 DIAGNOSIS — J449 Chronic obstructive pulmonary disease, unspecified: Secondary | ICD-10-CM | POA: Diagnosis not present

## 2022-12-16 DIAGNOSIS — F0393 Unspecified dementia, unspecified severity, with mood disturbance: Secondary | ICD-10-CM | POA: Diagnosis not present

## 2022-12-17 DIAGNOSIS — N390 Urinary tract infection, site not specified: Secondary | ICD-10-CM | POA: Diagnosis not present

## 2022-12-17 DIAGNOSIS — I10 Essential (primary) hypertension: Secondary | ICD-10-CM | POA: Diagnosis not present

## 2022-12-17 DIAGNOSIS — F0393 Unspecified dementia, unspecified severity, with mood disturbance: Secondary | ICD-10-CM | POA: Diagnosis not present

## 2022-12-17 DIAGNOSIS — J449 Chronic obstructive pulmonary disease, unspecified: Secondary | ICD-10-CM | POA: Diagnosis not present

## 2022-12-17 DIAGNOSIS — J9611 Chronic respiratory failure with hypoxia: Secondary | ICD-10-CM | POA: Diagnosis not present

## 2022-12-17 DIAGNOSIS — E119 Type 2 diabetes mellitus without complications: Secondary | ICD-10-CM | POA: Diagnosis not present

## 2022-12-18 DIAGNOSIS — E119 Type 2 diabetes mellitus without complications: Secondary | ICD-10-CM | POA: Diagnosis not present

## 2022-12-18 DIAGNOSIS — J9611 Chronic respiratory failure with hypoxia: Secondary | ICD-10-CM | POA: Diagnosis not present

## 2022-12-18 DIAGNOSIS — J449 Chronic obstructive pulmonary disease, unspecified: Secondary | ICD-10-CM | POA: Diagnosis not present

## 2022-12-18 DIAGNOSIS — F0393 Unspecified dementia, unspecified severity, with mood disturbance: Secondary | ICD-10-CM | POA: Diagnosis not present

## 2022-12-18 DIAGNOSIS — N390 Urinary tract infection, site not specified: Secondary | ICD-10-CM | POA: Diagnosis not present

## 2022-12-18 DIAGNOSIS — I10 Essential (primary) hypertension: Secondary | ICD-10-CM | POA: Diagnosis not present

## 2022-12-19 DIAGNOSIS — J449 Chronic obstructive pulmonary disease, unspecified: Secondary | ICD-10-CM | POA: Diagnosis not present

## 2022-12-19 DIAGNOSIS — J9611 Chronic respiratory failure with hypoxia: Secondary | ICD-10-CM | POA: Diagnosis not present

## 2022-12-19 DIAGNOSIS — I10 Essential (primary) hypertension: Secondary | ICD-10-CM | POA: Diagnosis not present

## 2022-12-19 DIAGNOSIS — E119 Type 2 diabetes mellitus without complications: Secondary | ICD-10-CM | POA: Diagnosis not present

## 2022-12-19 DIAGNOSIS — N390 Urinary tract infection, site not specified: Secondary | ICD-10-CM | POA: Diagnosis not present

## 2022-12-19 DIAGNOSIS — F0393 Unspecified dementia, unspecified severity, with mood disturbance: Secondary | ICD-10-CM | POA: Diagnosis not present

## 2022-12-20 DIAGNOSIS — E119 Type 2 diabetes mellitus without complications: Secondary | ICD-10-CM | POA: Diagnosis not present

## 2022-12-20 DIAGNOSIS — J9611 Chronic respiratory failure with hypoxia: Secondary | ICD-10-CM | POA: Diagnosis not present

## 2022-12-20 DIAGNOSIS — J449 Chronic obstructive pulmonary disease, unspecified: Secondary | ICD-10-CM | POA: Diagnosis not present

## 2022-12-20 DIAGNOSIS — I10 Essential (primary) hypertension: Secondary | ICD-10-CM | POA: Diagnosis not present

## 2022-12-20 DIAGNOSIS — F0393 Unspecified dementia, unspecified severity, with mood disturbance: Secondary | ICD-10-CM | POA: Diagnosis not present

## 2022-12-20 DIAGNOSIS — N390 Urinary tract infection, site not specified: Secondary | ICD-10-CM | POA: Diagnosis not present

## 2022-12-21 DIAGNOSIS — E119 Type 2 diabetes mellitus without complications: Secondary | ICD-10-CM | POA: Diagnosis not present

## 2022-12-21 DIAGNOSIS — J9611 Chronic respiratory failure with hypoxia: Secondary | ICD-10-CM | POA: Diagnosis not present

## 2022-12-21 DIAGNOSIS — N390 Urinary tract infection, site not specified: Secondary | ICD-10-CM | POA: Diagnosis not present

## 2022-12-21 DIAGNOSIS — I10 Essential (primary) hypertension: Secondary | ICD-10-CM | POA: Diagnosis not present

## 2022-12-21 DIAGNOSIS — J449 Chronic obstructive pulmonary disease, unspecified: Secondary | ICD-10-CM | POA: Diagnosis not present

## 2022-12-21 DIAGNOSIS — F0393 Unspecified dementia, unspecified severity, with mood disturbance: Secondary | ICD-10-CM | POA: Diagnosis not present

## 2022-12-22 DIAGNOSIS — E119 Type 2 diabetes mellitus without complications: Secondary | ICD-10-CM | POA: Diagnosis not present

## 2022-12-22 DIAGNOSIS — I10 Essential (primary) hypertension: Secondary | ICD-10-CM | POA: Diagnosis not present

## 2022-12-22 DIAGNOSIS — N390 Urinary tract infection, site not specified: Secondary | ICD-10-CM | POA: Diagnosis not present

## 2022-12-22 DIAGNOSIS — J449 Chronic obstructive pulmonary disease, unspecified: Secondary | ICD-10-CM | POA: Diagnosis not present

## 2022-12-22 DIAGNOSIS — J9611 Chronic respiratory failure with hypoxia: Secondary | ICD-10-CM | POA: Diagnosis not present

## 2022-12-22 DIAGNOSIS — F0393 Unspecified dementia, unspecified severity, with mood disturbance: Secondary | ICD-10-CM | POA: Diagnosis not present

## 2022-12-23 DIAGNOSIS — N390 Urinary tract infection, site not specified: Secondary | ICD-10-CM | POA: Diagnosis not present

## 2022-12-23 DIAGNOSIS — J9611 Chronic respiratory failure with hypoxia: Secondary | ICD-10-CM | POA: Diagnosis not present

## 2022-12-23 DIAGNOSIS — J449 Chronic obstructive pulmonary disease, unspecified: Secondary | ICD-10-CM | POA: Diagnosis not present

## 2022-12-23 DIAGNOSIS — F0393 Unspecified dementia, unspecified severity, with mood disturbance: Secondary | ICD-10-CM | POA: Diagnosis not present

## 2022-12-23 DIAGNOSIS — E119 Type 2 diabetes mellitus without complications: Secondary | ICD-10-CM | POA: Diagnosis not present

## 2022-12-23 DIAGNOSIS — I10 Essential (primary) hypertension: Secondary | ICD-10-CM | POA: Diagnosis not present

## 2022-12-24 DIAGNOSIS — F0393 Unspecified dementia, unspecified severity, with mood disturbance: Secondary | ICD-10-CM | POA: Diagnosis not present

## 2022-12-24 DIAGNOSIS — E119 Type 2 diabetes mellitus without complications: Secondary | ICD-10-CM | POA: Diagnosis not present

## 2022-12-24 DIAGNOSIS — N390 Urinary tract infection, site not specified: Secondary | ICD-10-CM | POA: Diagnosis not present

## 2022-12-24 DIAGNOSIS — I10 Essential (primary) hypertension: Secondary | ICD-10-CM | POA: Diagnosis not present

## 2022-12-24 DIAGNOSIS — J449 Chronic obstructive pulmonary disease, unspecified: Secondary | ICD-10-CM | POA: Diagnosis not present

## 2022-12-24 DIAGNOSIS — J9611 Chronic respiratory failure with hypoxia: Secondary | ICD-10-CM | POA: Diagnosis not present

## 2022-12-25 DIAGNOSIS — F0393 Unspecified dementia, unspecified severity, with mood disturbance: Secondary | ICD-10-CM | POA: Diagnosis not present

## 2022-12-25 DIAGNOSIS — J9611 Chronic respiratory failure with hypoxia: Secondary | ICD-10-CM | POA: Diagnosis not present

## 2022-12-25 DIAGNOSIS — J449 Chronic obstructive pulmonary disease, unspecified: Secondary | ICD-10-CM | POA: Diagnosis not present

## 2022-12-25 DIAGNOSIS — I10 Essential (primary) hypertension: Secondary | ICD-10-CM | POA: Diagnosis not present

## 2022-12-25 DIAGNOSIS — N390 Urinary tract infection, site not specified: Secondary | ICD-10-CM | POA: Diagnosis not present

## 2022-12-25 DIAGNOSIS — E119 Type 2 diabetes mellitus without complications: Secondary | ICD-10-CM | POA: Diagnosis not present

## 2022-12-26 DIAGNOSIS — N390 Urinary tract infection, site not specified: Secondary | ICD-10-CM | POA: Diagnosis not present

## 2022-12-26 DIAGNOSIS — E119 Type 2 diabetes mellitus without complications: Secondary | ICD-10-CM | POA: Diagnosis not present

## 2022-12-26 DIAGNOSIS — I10 Essential (primary) hypertension: Secondary | ICD-10-CM | POA: Diagnosis not present

## 2022-12-26 DIAGNOSIS — F0393 Unspecified dementia, unspecified severity, with mood disturbance: Secondary | ICD-10-CM | POA: Diagnosis not present

## 2022-12-26 DIAGNOSIS — J449 Chronic obstructive pulmonary disease, unspecified: Secondary | ICD-10-CM | POA: Diagnosis not present

## 2022-12-26 DIAGNOSIS — J9611 Chronic respiratory failure with hypoxia: Secondary | ICD-10-CM | POA: Diagnosis not present

## 2022-12-27 DIAGNOSIS — F0393 Unspecified dementia, unspecified severity, with mood disturbance: Secondary | ICD-10-CM | POA: Diagnosis not present

## 2022-12-27 DIAGNOSIS — E119 Type 2 diabetes mellitus without complications: Secondary | ICD-10-CM | POA: Diagnosis not present

## 2022-12-27 DIAGNOSIS — J449 Chronic obstructive pulmonary disease, unspecified: Secondary | ICD-10-CM | POA: Diagnosis not present

## 2022-12-27 DIAGNOSIS — N390 Urinary tract infection, site not specified: Secondary | ICD-10-CM | POA: Diagnosis not present

## 2022-12-27 DIAGNOSIS — I10 Essential (primary) hypertension: Secondary | ICD-10-CM | POA: Diagnosis not present

## 2022-12-27 DIAGNOSIS — J9611 Chronic respiratory failure with hypoxia: Secondary | ICD-10-CM | POA: Diagnosis not present

## 2022-12-28 DIAGNOSIS — F0393 Unspecified dementia, unspecified severity, with mood disturbance: Secondary | ICD-10-CM | POA: Diagnosis not present

## 2022-12-28 DIAGNOSIS — N390 Urinary tract infection, site not specified: Secondary | ICD-10-CM | POA: Diagnosis not present

## 2022-12-28 DIAGNOSIS — E119 Type 2 diabetes mellitus without complications: Secondary | ICD-10-CM | POA: Diagnosis not present

## 2022-12-28 DIAGNOSIS — I10 Essential (primary) hypertension: Secondary | ICD-10-CM | POA: Diagnosis not present

## 2022-12-28 DIAGNOSIS — J9611 Chronic respiratory failure with hypoxia: Secondary | ICD-10-CM | POA: Diagnosis not present

## 2022-12-28 DIAGNOSIS — J449 Chronic obstructive pulmonary disease, unspecified: Secondary | ICD-10-CM | POA: Diagnosis not present

## 2022-12-29 DIAGNOSIS — E119 Type 2 diabetes mellitus without complications: Secondary | ICD-10-CM | POA: Diagnosis not present

## 2022-12-29 DIAGNOSIS — F0393 Unspecified dementia, unspecified severity, with mood disturbance: Secondary | ICD-10-CM | POA: Diagnosis not present

## 2022-12-29 DIAGNOSIS — J449 Chronic obstructive pulmonary disease, unspecified: Secondary | ICD-10-CM | POA: Diagnosis not present

## 2022-12-29 DIAGNOSIS — N390 Urinary tract infection, site not specified: Secondary | ICD-10-CM | POA: Diagnosis not present

## 2022-12-29 DIAGNOSIS — J9611 Chronic respiratory failure with hypoxia: Secondary | ICD-10-CM | POA: Diagnosis not present

## 2022-12-29 DIAGNOSIS — I10 Essential (primary) hypertension: Secondary | ICD-10-CM | POA: Diagnosis not present

## 2022-12-30 DIAGNOSIS — F0393 Unspecified dementia, unspecified severity, with mood disturbance: Secondary | ICD-10-CM | POA: Diagnosis not present

## 2022-12-30 DIAGNOSIS — J9611 Chronic respiratory failure with hypoxia: Secondary | ICD-10-CM | POA: Diagnosis not present

## 2022-12-30 DIAGNOSIS — N390 Urinary tract infection, site not specified: Secondary | ICD-10-CM | POA: Diagnosis not present

## 2022-12-30 DIAGNOSIS — J449 Chronic obstructive pulmonary disease, unspecified: Secondary | ICD-10-CM | POA: Diagnosis not present

## 2022-12-30 DIAGNOSIS — I10 Essential (primary) hypertension: Secondary | ICD-10-CM | POA: Diagnosis not present

## 2022-12-30 DIAGNOSIS — E119 Type 2 diabetes mellitus without complications: Secondary | ICD-10-CM | POA: Diagnosis not present

## 2022-12-31 DIAGNOSIS — I10 Essential (primary) hypertension: Secondary | ICD-10-CM | POA: Diagnosis not present

## 2022-12-31 DIAGNOSIS — F0393 Unspecified dementia, unspecified severity, with mood disturbance: Secondary | ICD-10-CM | POA: Diagnosis not present

## 2022-12-31 DIAGNOSIS — J9611 Chronic respiratory failure with hypoxia: Secondary | ICD-10-CM | POA: Diagnosis not present

## 2022-12-31 DIAGNOSIS — J449 Chronic obstructive pulmonary disease, unspecified: Secondary | ICD-10-CM | POA: Diagnosis not present

## 2022-12-31 DIAGNOSIS — N390 Urinary tract infection, site not specified: Secondary | ICD-10-CM | POA: Diagnosis not present

## 2022-12-31 DIAGNOSIS — E119 Type 2 diabetes mellitus without complications: Secondary | ICD-10-CM | POA: Diagnosis not present

## 2023-01-01 DIAGNOSIS — N390 Urinary tract infection, site not specified: Secondary | ICD-10-CM | POA: Diagnosis not present

## 2023-01-01 DIAGNOSIS — J9611 Chronic respiratory failure with hypoxia: Secondary | ICD-10-CM | POA: Diagnosis not present

## 2023-01-01 DIAGNOSIS — F0393 Unspecified dementia, unspecified severity, with mood disturbance: Secondary | ICD-10-CM | POA: Diagnosis not present

## 2023-01-01 DIAGNOSIS — I10 Essential (primary) hypertension: Secondary | ICD-10-CM | POA: Diagnosis not present

## 2023-01-01 DIAGNOSIS — E119 Type 2 diabetes mellitus without complications: Secondary | ICD-10-CM | POA: Diagnosis not present

## 2023-01-01 DIAGNOSIS — J449 Chronic obstructive pulmonary disease, unspecified: Secondary | ICD-10-CM | POA: Diagnosis not present

## 2023-01-02 DIAGNOSIS — E119 Type 2 diabetes mellitus without complications: Secondary | ICD-10-CM | POA: Diagnosis not present

## 2023-01-02 DIAGNOSIS — F0393 Unspecified dementia, unspecified severity, with mood disturbance: Secondary | ICD-10-CM | POA: Diagnosis not present

## 2023-01-02 DIAGNOSIS — N390 Urinary tract infection, site not specified: Secondary | ICD-10-CM | POA: Diagnosis not present

## 2023-01-02 DIAGNOSIS — J9611 Chronic respiratory failure with hypoxia: Secondary | ICD-10-CM | POA: Diagnosis not present

## 2023-01-02 DIAGNOSIS — J449 Chronic obstructive pulmonary disease, unspecified: Secondary | ICD-10-CM | POA: Diagnosis not present

## 2023-01-02 DIAGNOSIS — I10 Essential (primary) hypertension: Secondary | ICD-10-CM | POA: Diagnosis not present

## 2023-01-03 DIAGNOSIS — J449 Chronic obstructive pulmonary disease, unspecified: Secondary | ICD-10-CM | POA: Diagnosis not present

## 2023-01-03 DIAGNOSIS — F0393 Unspecified dementia, unspecified severity, with mood disturbance: Secondary | ICD-10-CM | POA: Diagnosis not present

## 2023-01-03 DIAGNOSIS — J9611 Chronic respiratory failure with hypoxia: Secondary | ICD-10-CM | POA: Diagnosis not present

## 2023-01-03 DIAGNOSIS — N390 Urinary tract infection, site not specified: Secondary | ICD-10-CM | POA: Diagnosis not present

## 2023-01-03 DIAGNOSIS — E119 Type 2 diabetes mellitus without complications: Secondary | ICD-10-CM | POA: Diagnosis not present

## 2023-01-03 DIAGNOSIS — I10 Essential (primary) hypertension: Secondary | ICD-10-CM | POA: Diagnosis not present

## 2023-01-04 DIAGNOSIS — J449 Chronic obstructive pulmonary disease, unspecified: Secondary | ICD-10-CM | POA: Diagnosis not present

## 2023-01-04 DIAGNOSIS — F0393 Unspecified dementia, unspecified severity, with mood disturbance: Secondary | ICD-10-CM | POA: Diagnosis not present

## 2023-01-04 DIAGNOSIS — N390 Urinary tract infection, site not specified: Secondary | ICD-10-CM | POA: Diagnosis not present

## 2023-01-04 DIAGNOSIS — I10 Essential (primary) hypertension: Secondary | ICD-10-CM | POA: Diagnosis not present

## 2023-01-04 DIAGNOSIS — J9611 Chronic respiratory failure with hypoxia: Secondary | ICD-10-CM | POA: Diagnosis not present

## 2023-01-04 DIAGNOSIS — E119 Type 2 diabetes mellitus without complications: Secondary | ICD-10-CM | POA: Diagnosis not present

## 2023-01-05 DIAGNOSIS — N390 Urinary tract infection, site not specified: Secondary | ICD-10-CM | POA: Diagnosis not present

## 2023-01-05 DIAGNOSIS — J9611 Chronic respiratory failure with hypoxia: Secondary | ICD-10-CM | POA: Diagnosis not present

## 2023-01-05 DIAGNOSIS — F0393 Unspecified dementia, unspecified severity, with mood disturbance: Secondary | ICD-10-CM | POA: Diagnosis not present

## 2023-01-05 DIAGNOSIS — J449 Chronic obstructive pulmonary disease, unspecified: Secondary | ICD-10-CM | POA: Diagnosis not present

## 2023-01-05 DIAGNOSIS — I10 Essential (primary) hypertension: Secondary | ICD-10-CM | POA: Diagnosis not present

## 2023-01-05 DIAGNOSIS — E119 Type 2 diabetes mellitus without complications: Secondary | ICD-10-CM | POA: Diagnosis not present

## 2023-01-06 DIAGNOSIS — N390 Urinary tract infection, site not specified: Secondary | ICD-10-CM | POA: Diagnosis not present

## 2023-01-06 DIAGNOSIS — J449 Chronic obstructive pulmonary disease, unspecified: Secondary | ICD-10-CM | POA: Diagnosis not present

## 2023-01-06 DIAGNOSIS — J9611 Chronic respiratory failure with hypoxia: Secondary | ICD-10-CM | POA: Diagnosis not present

## 2023-01-06 DIAGNOSIS — F0393 Unspecified dementia, unspecified severity, with mood disturbance: Secondary | ICD-10-CM | POA: Diagnosis not present

## 2023-01-06 DIAGNOSIS — I10 Essential (primary) hypertension: Secondary | ICD-10-CM | POA: Diagnosis not present

## 2023-01-06 DIAGNOSIS — E119 Type 2 diabetes mellitus without complications: Secondary | ICD-10-CM | POA: Diagnosis not present

## 2023-01-07 DIAGNOSIS — J9611 Chronic respiratory failure with hypoxia: Secondary | ICD-10-CM | POA: Diagnosis not present

## 2023-01-07 DIAGNOSIS — I10 Essential (primary) hypertension: Secondary | ICD-10-CM | POA: Diagnosis not present

## 2023-01-07 DIAGNOSIS — F0393 Unspecified dementia, unspecified severity, with mood disturbance: Secondary | ICD-10-CM | POA: Diagnosis not present

## 2023-01-07 DIAGNOSIS — J449 Chronic obstructive pulmonary disease, unspecified: Secondary | ICD-10-CM | POA: Diagnosis not present

## 2023-01-07 DIAGNOSIS — E119 Type 2 diabetes mellitus without complications: Secondary | ICD-10-CM | POA: Diagnosis not present

## 2023-01-07 DIAGNOSIS — N390 Urinary tract infection, site not specified: Secondary | ICD-10-CM | POA: Diagnosis not present

## 2023-01-08 DIAGNOSIS — J449 Chronic obstructive pulmonary disease, unspecified: Secondary | ICD-10-CM | POA: Diagnosis not present

## 2023-01-08 DIAGNOSIS — J9611 Chronic respiratory failure with hypoxia: Secondary | ICD-10-CM | POA: Diagnosis not present

## 2023-01-08 DIAGNOSIS — F0393 Unspecified dementia, unspecified severity, with mood disturbance: Secondary | ICD-10-CM | POA: Diagnosis not present

## 2023-01-08 DIAGNOSIS — N390 Urinary tract infection, site not specified: Secondary | ICD-10-CM | POA: Diagnosis not present

## 2023-01-08 DIAGNOSIS — I10 Essential (primary) hypertension: Secondary | ICD-10-CM | POA: Diagnosis not present

## 2023-01-08 DIAGNOSIS — E119 Type 2 diabetes mellitus without complications: Secondary | ICD-10-CM | POA: Diagnosis not present

## 2023-01-09 DIAGNOSIS — J9611 Chronic respiratory failure with hypoxia: Secondary | ICD-10-CM | POA: Diagnosis not present

## 2023-01-09 DIAGNOSIS — I10 Essential (primary) hypertension: Secondary | ICD-10-CM | POA: Diagnosis not present

## 2023-01-09 DIAGNOSIS — J449 Chronic obstructive pulmonary disease, unspecified: Secondary | ICD-10-CM | POA: Diagnosis not present

## 2023-01-09 DIAGNOSIS — N390 Urinary tract infection, site not specified: Secondary | ICD-10-CM | POA: Diagnosis not present

## 2023-01-09 DIAGNOSIS — E119 Type 2 diabetes mellitus without complications: Secondary | ICD-10-CM | POA: Diagnosis not present

## 2023-01-09 DIAGNOSIS — F0393 Unspecified dementia, unspecified severity, with mood disturbance: Secondary | ICD-10-CM | POA: Diagnosis not present

## 2023-01-10 DIAGNOSIS — F0393 Unspecified dementia, unspecified severity, with mood disturbance: Secondary | ICD-10-CM | POA: Diagnosis not present

## 2023-01-10 DIAGNOSIS — N390 Urinary tract infection, site not specified: Secondary | ICD-10-CM | POA: Diagnosis not present

## 2023-01-10 DIAGNOSIS — E119 Type 2 diabetes mellitus without complications: Secondary | ICD-10-CM | POA: Diagnosis not present

## 2023-01-10 DIAGNOSIS — J449 Chronic obstructive pulmonary disease, unspecified: Secondary | ICD-10-CM | POA: Diagnosis not present

## 2023-01-10 DIAGNOSIS — J9611 Chronic respiratory failure with hypoxia: Secondary | ICD-10-CM | POA: Diagnosis not present

## 2023-01-10 DIAGNOSIS — I10 Essential (primary) hypertension: Secondary | ICD-10-CM | POA: Diagnosis not present

## 2023-01-11 DIAGNOSIS — E119 Type 2 diabetes mellitus without complications: Secondary | ICD-10-CM | POA: Diagnosis not present

## 2023-01-11 DIAGNOSIS — J9611 Chronic respiratory failure with hypoxia: Secondary | ICD-10-CM | POA: Diagnosis not present

## 2023-01-11 DIAGNOSIS — N390 Urinary tract infection, site not specified: Secondary | ICD-10-CM | POA: Diagnosis not present

## 2023-01-11 DIAGNOSIS — J449 Chronic obstructive pulmonary disease, unspecified: Secondary | ICD-10-CM | POA: Diagnosis not present

## 2023-01-11 DIAGNOSIS — F0393 Unspecified dementia, unspecified severity, with mood disturbance: Secondary | ICD-10-CM | POA: Diagnosis not present

## 2023-01-11 DIAGNOSIS — I10 Essential (primary) hypertension: Secondary | ICD-10-CM | POA: Diagnosis not present

## 2023-01-12 DIAGNOSIS — R159 Full incontinence of feces: Secondary | ICD-10-CM | POA: Diagnosis not present

## 2023-01-12 DIAGNOSIS — J449 Chronic obstructive pulmonary disease, unspecified: Secondary | ICD-10-CM | POA: Diagnosis not present

## 2023-01-12 DIAGNOSIS — N390 Urinary tract infection, site not specified: Secondary | ICD-10-CM | POA: Diagnosis not present

## 2023-01-12 DIAGNOSIS — K219 Gastro-esophageal reflux disease without esophagitis: Secondary | ICD-10-CM | POA: Diagnosis not present

## 2023-01-12 DIAGNOSIS — Z9981 Dependence on supplemental oxygen: Secondary | ICD-10-CM | POA: Diagnosis not present

## 2023-01-12 DIAGNOSIS — E785 Hyperlipidemia, unspecified: Secondary | ICD-10-CM | POA: Diagnosis not present

## 2023-01-12 DIAGNOSIS — I10 Essential (primary) hypertension: Secondary | ICD-10-CM | POA: Diagnosis not present

## 2023-01-12 DIAGNOSIS — R32 Unspecified urinary incontinence: Secondary | ICD-10-CM | POA: Diagnosis not present

## 2023-01-12 DIAGNOSIS — F0393 Unspecified dementia, unspecified severity, with mood disturbance: Secondary | ICD-10-CM | POA: Diagnosis not present

## 2023-01-12 DIAGNOSIS — J9611 Chronic respiratory failure with hypoxia: Secondary | ICD-10-CM | POA: Diagnosis not present

## 2023-01-12 DIAGNOSIS — E119 Type 2 diabetes mellitus without complications: Secondary | ICD-10-CM | POA: Diagnosis not present

## 2023-01-12 DIAGNOSIS — K2270 Barrett's esophagus without dysplasia: Secondary | ICD-10-CM | POA: Diagnosis not present

## 2023-01-13 DIAGNOSIS — E119 Type 2 diabetes mellitus without complications: Secondary | ICD-10-CM | POA: Diagnosis not present

## 2023-01-13 DIAGNOSIS — F0393 Unspecified dementia, unspecified severity, with mood disturbance: Secondary | ICD-10-CM | POA: Diagnosis not present

## 2023-01-13 DIAGNOSIS — I10 Essential (primary) hypertension: Secondary | ICD-10-CM | POA: Diagnosis not present

## 2023-01-13 DIAGNOSIS — J9611 Chronic respiratory failure with hypoxia: Secondary | ICD-10-CM | POA: Diagnosis not present

## 2023-01-13 DIAGNOSIS — N390 Urinary tract infection, site not specified: Secondary | ICD-10-CM | POA: Diagnosis not present

## 2023-01-13 DIAGNOSIS — J449 Chronic obstructive pulmonary disease, unspecified: Secondary | ICD-10-CM | POA: Diagnosis not present

## 2023-01-14 DIAGNOSIS — E119 Type 2 diabetes mellitus without complications: Secondary | ICD-10-CM | POA: Diagnosis not present

## 2023-01-14 DIAGNOSIS — F0393 Unspecified dementia, unspecified severity, with mood disturbance: Secondary | ICD-10-CM | POA: Diagnosis not present

## 2023-01-14 DIAGNOSIS — I10 Essential (primary) hypertension: Secondary | ICD-10-CM | POA: Diagnosis not present

## 2023-01-14 DIAGNOSIS — N390 Urinary tract infection, site not specified: Secondary | ICD-10-CM | POA: Diagnosis not present

## 2023-01-14 DIAGNOSIS — J9611 Chronic respiratory failure with hypoxia: Secondary | ICD-10-CM | POA: Diagnosis not present

## 2023-01-14 DIAGNOSIS — J449 Chronic obstructive pulmonary disease, unspecified: Secondary | ICD-10-CM | POA: Diagnosis not present

## 2023-01-15 DIAGNOSIS — E119 Type 2 diabetes mellitus without complications: Secondary | ICD-10-CM | POA: Diagnosis not present

## 2023-01-15 DIAGNOSIS — F0393 Unspecified dementia, unspecified severity, with mood disturbance: Secondary | ICD-10-CM | POA: Diagnosis not present

## 2023-01-15 DIAGNOSIS — J9611 Chronic respiratory failure with hypoxia: Secondary | ICD-10-CM | POA: Diagnosis not present

## 2023-01-15 DIAGNOSIS — I10 Essential (primary) hypertension: Secondary | ICD-10-CM | POA: Diagnosis not present

## 2023-01-15 DIAGNOSIS — J449 Chronic obstructive pulmonary disease, unspecified: Secondary | ICD-10-CM | POA: Diagnosis not present

## 2023-01-15 DIAGNOSIS — N390 Urinary tract infection, site not specified: Secondary | ICD-10-CM | POA: Diagnosis not present

## 2023-01-16 DIAGNOSIS — J9611 Chronic respiratory failure with hypoxia: Secondary | ICD-10-CM | POA: Diagnosis not present

## 2023-01-16 DIAGNOSIS — E119 Type 2 diabetes mellitus without complications: Secondary | ICD-10-CM | POA: Diagnosis not present

## 2023-01-16 DIAGNOSIS — I10 Essential (primary) hypertension: Secondary | ICD-10-CM | POA: Diagnosis not present

## 2023-01-16 DIAGNOSIS — F0393 Unspecified dementia, unspecified severity, with mood disturbance: Secondary | ICD-10-CM | POA: Diagnosis not present

## 2023-01-16 DIAGNOSIS — J449 Chronic obstructive pulmonary disease, unspecified: Secondary | ICD-10-CM | POA: Diagnosis not present

## 2023-01-16 DIAGNOSIS — N390 Urinary tract infection, site not specified: Secondary | ICD-10-CM | POA: Diagnosis not present

## 2023-01-17 DIAGNOSIS — J449 Chronic obstructive pulmonary disease, unspecified: Secondary | ICD-10-CM | POA: Diagnosis not present

## 2023-01-17 DIAGNOSIS — N390 Urinary tract infection, site not specified: Secondary | ICD-10-CM | POA: Diagnosis not present

## 2023-01-17 DIAGNOSIS — E119 Type 2 diabetes mellitus without complications: Secondary | ICD-10-CM | POA: Diagnosis not present

## 2023-01-17 DIAGNOSIS — I10 Essential (primary) hypertension: Secondary | ICD-10-CM | POA: Diagnosis not present

## 2023-01-17 DIAGNOSIS — J9611 Chronic respiratory failure with hypoxia: Secondary | ICD-10-CM | POA: Diagnosis not present

## 2023-01-17 DIAGNOSIS — F0393 Unspecified dementia, unspecified severity, with mood disturbance: Secondary | ICD-10-CM | POA: Diagnosis not present

## 2023-01-18 DIAGNOSIS — J449 Chronic obstructive pulmonary disease, unspecified: Secondary | ICD-10-CM | POA: Diagnosis not present

## 2023-01-18 DIAGNOSIS — I10 Essential (primary) hypertension: Secondary | ICD-10-CM | POA: Diagnosis not present

## 2023-01-18 DIAGNOSIS — F0393 Unspecified dementia, unspecified severity, with mood disturbance: Secondary | ICD-10-CM | POA: Diagnosis not present

## 2023-01-18 DIAGNOSIS — E119 Type 2 diabetes mellitus without complications: Secondary | ICD-10-CM | POA: Diagnosis not present

## 2023-01-18 DIAGNOSIS — N390 Urinary tract infection, site not specified: Secondary | ICD-10-CM | POA: Diagnosis not present

## 2023-01-18 DIAGNOSIS — J9611 Chronic respiratory failure with hypoxia: Secondary | ICD-10-CM | POA: Diagnosis not present

## 2023-01-19 DIAGNOSIS — N390 Urinary tract infection, site not specified: Secondary | ICD-10-CM | POA: Diagnosis not present

## 2023-01-19 DIAGNOSIS — I10 Essential (primary) hypertension: Secondary | ICD-10-CM | POA: Diagnosis not present

## 2023-01-19 DIAGNOSIS — F0393 Unspecified dementia, unspecified severity, with mood disturbance: Secondary | ICD-10-CM | POA: Diagnosis not present

## 2023-01-19 DIAGNOSIS — J449 Chronic obstructive pulmonary disease, unspecified: Secondary | ICD-10-CM | POA: Diagnosis not present

## 2023-01-19 DIAGNOSIS — J9611 Chronic respiratory failure with hypoxia: Secondary | ICD-10-CM | POA: Diagnosis not present

## 2023-01-19 DIAGNOSIS — E119 Type 2 diabetes mellitus without complications: Secondary | ICD-10-CM | POA: Diagnosis not present

## 2023-01-20 DIAGNOSIS — J449 Chronic obstructive pulmonary disease, unspecified: Secondary | ICD-10-CM | POA: Diagnosis not present

## 2023-01-20 DIAGNOSIS — F0393 Unspecified dementia, unspecified severity, with mood disturbance: Secondary | ICD-10-CM | POA: Diagnosis not present

## 2023-01-20 DIAGNOSIS — E119 Type 2 diabetes mellitus without complications: Secondary | ICD-10-CM | POA: Diagnosis not present

## 2023-01-20 DIAGNOSIS — J9611 Chronic respiratory failure with hypoxia: Secondary | ICD-10-CM | POA: Diagnosis not present

## 2023-01-20 DIAGNOSIS — I10 Essential (primary) hypertension: Secondary | ICD-10-CM | POA: Diagnosis not present

## 2023-01-20 DIAGNOSIS — N390 Urinary tract infection, site not specified: Secondary | ICD-10-CM | POA: Diagnosis not present

## 2023-01-21 DIAGNOSIS — J449 Chronic obstructive pulmonary disease, unspecified: Secondary | ICD-10-CM | POA: Diagnosis not present

## 2023-01-21 DIAGNOSIS — J9611 Chronic respiratory failure with hypoxia: Secondary | ICD-10-CM | POA: Diagnosis not present

## 2023-01-21 DIAGNOSIS — N390 Urinary tract infection, site not specified: Secondary | ICD-10-CM | POA: Diagnosis not present

## 2023-01-21 DIAGNOSIS — I10 Essential (primary) hypertension: Secondary | ICD-10-CM | POA: Diagnosis not present

## 2023-01-21 DIAGNOSIS — F0393 Unspecified dementia, unspecified severity, with mood disturbance: Secondary | ICD-10-CM | POA: Diagnosis not present

## 2023-01-21 DIAGNOSIS — E119 Type 2 diabetes mellitus without complications: Secondary | ICD-10-CM | POA: Diagnosis not present

## 2023-01-22 DIAGNOSIS — J9611 Chronic respiratory failure with hypoxia: Secondary | ICD-10-CM | POA: Diagnosis not present

## 2023-01-22 DIAGNOSIS — N390 Urinary tract infection, site not specified: Secondary | ICD-10-CM | POA: Diagnosis not present

## 2023-01-22 DIAGNOSIS — E119 Type 2 diabetes mellitus without complications: Secondary | ICD-10-CM | POA: Diagnosis not present

## 2023-01-22 DIAGNOSIS — J449 Chronic obstructive pulmonary disease, unspecified: Secondary | ICD-10-CM | POA: Diagnosis not present

## 2023-01-22 DIAGNOSIS — F0393 Unspecified dementia, unspecified severity, with mood disturbance: Secondary | ICD-10-CM | POA: Diagnosis not present

## 2023-01-22 DIAGNOSIS — I10 Essential (primary) hypertension: Secondary | ICD-10-CM | POA: Diagnosis not present

## 2023-01-23 DIAGNOSIS — J449 Chronic obstructive pulmonary disease, unspecified: Secondary | ICD-10-CM | POA: Diagnosis not present

## 2023-01-23 DIAGNOSIS — N390 Urinary tract infection, site not specified: Secondary | ICD-10-CM | POA: Diagnosis not present

## 2023-01-23 DIAGNOSIS — F0393 Unspecified dementia, unspecified severity, with mood disturbance: Secondary | ICD-10-CM | POA: Diagnosis not present

## 2023-01-23 DIAGNOSIS — I10 Essential (primary) hypertension: Secondary | ICD-10-CM | POA: Diagnosis not present

## 2023-01-23 DIAGNOSIS — J9611 Chronic respiratory failure with hypoxia: Secondary | ICD-10-CM | POA: Diagnosis not present

## 2023-01-23 DIAGNOSIS — E119 Type 2 diabetes mellitus without complications: Secondary | ICD-10-CM | POA: Diagnosis not present

## 2023-01-24 DIAGNOSIS — I10 Essential (primary) hypertension: Secondary | ICD-10-CM | POA: Diagnosis not present

## 2023-01-24 DIAGNOSIS — N390 Urinary tract infection, site not specified: Secondary | ICD-10-CM | POA: Diagnosis not present

## 2023-01-24 DIAGNOSIS — J449 Chronic obstructive pulmonary disease, unspecified: Secondary | ICD-10-CM | POA: Diagnosis not present

## 2023-01-24 DIAGNOSIS — J9611 Chronic respiratory failure with hypoxia: Secondary | ICD-10-CM | POA: Diagnosis not present

## 2023-01-24 DIAGNOSIS — F0393 Unspecified dementia, unspecified severity, with mood disturbance: Secondary | ICD-10-CM | POA: Diagnosis not present

## 2023-01-24 DIAGNOSIS — E119 Type 2 diabetes mellitus without complications: Secondary | ICD-10-CM | POA: Diagnosis not present

## 2023-01-25 DIAGNOSIS — J9611 Chronic respiratory failure with hypoxia: Secondary | ICD-10-CM | POA: Diagnosis not present

## 2023-01-25 DIAGNOSIS — E119 Type 2 diabetes mellitus without complications: Secondary | ICD-10-CM | POA: Diagnosis not present

## 2023-01-25 DIAGNOSIS — F0393 Unspecified dementia, unspecified severity, with mood disturbance: Secondary | ICD-10-CM | POA: Diagnosis not present

## 2023-01-25 DIAGNOSIS — N390 Urinary tract infection, site not specified: Secondary | ICD-10-CM | POA: Diagnosis not present

## 2023-01-25 DIAGNOSIS — J449 Chronic obstructive pulmonary disease, unspecified: Secondary | ICD-10-CM | POA: Diagnosis not present

## 2023-01-25 DIAGNOSIS — I10 Essential (primary) hypertension: Secondary | ICD-10-CM | POA: Diagnosis not present

## 2023-01-26 DIAGNOSIS — I10 Essential (primary) hypertension: Secondary | ICD-10-CM | POA: Diagnosis not present

## 2023-01-26 DIAGNOSIS — J449 Chronic obstructive pulmonary disease, unspecified: Secondary | ICD-10-CM | POA: Diagnosis not present

## 2023-01-26 DIAGNOSIS — F0393 Unspecified dementia, unspecified severity, with mood disturbance: Secondary | ICD-10-CM | POA: Diagnosis not present

## 2023-01-26 DIAGNOSIS — N390 Urinary tract infection, site not specified: Secondary | ICD-10-CM | POA: Diagnosis not present

## 2023-01-26 DIAGNOSIS — J9611 Chronic respiratory failure with hypoxia: Secondary | ICD-10-CM | POA: Diagnosis not present

## 2023-01-26 DIAGNOSIS — E119 Type 2 diabetes mellitus without complications: Secondary | ICD-10-CM | POA: Diagnosis not present

## 2023-01-27 DIAGNOSIS — I10 Essential (primary) hypertension: Secondary | ICD-10-CM | POA: Diagnosis not present

## 2023-01-27 DIAGNOSIS — N390 Urinary tract infection, site not specified: Secondary | ICD-10-CM | POA: Diagnosis not present

## 2023-01-27 DIAGNOSIS — E119 Type 2 diabetes mellitus without complications: Secondary | ICD-10-CM | POA: Diagnosis not present

## 2023-01-27 DIAGNOSIS — J9611 Chronic respiratory failure with hypoxia: Secondary | ICD-10-CM | POA: Diagnosis not present

## 2023-01-27 DIAGNOSIS — J449 Chronic obstructive pulmonary disease, unspecified: Secondary | ICD-10-CM | POA: Diagnosis not present

## 2023-01-27 DIAGNOSIS — F0393 Unspecified dementia, unspecified severity, with mood disturbance: Secondary | ICD-10-CM | POA: Diagnosis not present

## 2023-01-28 DIAGNOSIS — J9611 Chronic respiratory failure with hypoxia: Secondary | ICD-10-CM | POA: Diagnosis not present

## 2023-01-28 DIAGNOSIS — N390 Urinary tract infection, site not specified: Secondary | ICD-10-CM | POA: Diagnosis not present

## 2023-01-28 DIAGNOSIS — F0393 Unspecified dementia, unspecified severity, with mood disturbance: Secondary | ICD-10-CM | POA: Diagnosis not present

## 2023-01-28 DIAGNOSIS — E119 Type 2 diabetes mellitus without complications: Secondary | ICD-10-CM | POA: Diagnosis not present

## 2023-01-28 DIAGNOSIS — I10 Essential (primary) hypertension: Secondary | ICD-10-CM | POA: Diagnosis not present

## 2023-01-28 DIAGNOSIS — J449 Chronic obstructive pulmonary disease, unspecified: Secondary | ICD-10-CM | POA: Diagnosis not present

## 2023-01-29 DIAGNOSIS — E119 Type 2 diabetes mellitus without complications: Secondary | ICD-10-CM | POA: Diagnosis not present

## 2023-01-29 DIAGNOSIS — J9611 Chronic respiratory failure with hypoxia: Secondary | ICD-10-CM | POA: Diagnosis not present

## 2023-01-29 DIAGNOSIS — I10 Essential (primary) hypertension: Secondary | ICD-10-CM | POA: Diagnosis not present

## 2023-01-29 DIAGNOSIS — F0393 Unspecified dementia, unspecified severity, with mood disturbance: Secondary | ICD-10-CM | POA: Diagnosis not present

## 2023-01-29 DIAGNOSIS — N390 Urinary tract infection, site not specified: Secondary | ICD-10-CM | POA: Diagnosis not present

## 2023-01-29 DIAGNOSIS — J449 Chronic obstructive pulmonary disease, unspecified: Secondary | ICD-10-CM | POA: Diagnosis not present

## 2023-01-30 DIAGNOSIS — J9611 Chronic respiratory failure with hypoxia: Secondary | ICD-10-CM | POA: Diagnosis not present

## 2023-01-30 DIAGNOSIS — N390 Urinary tract infection, site not specified: Secondary | ICD-10-CM | POA: Diagnosis not present

## 2023-01-30 DIAGNOSIS — J449 Chronic obstructive pulmonary disease, unspecified: Secondary | ICD-10-CM | POA: Diagnosis not present

## 2023-01-30 DIAGNOSIS — F0393 Unspecified dementia, unspecified severity, with mood disturbance: Secondary | ICD-10-CM | POA: Diagnosis not present

## 2023-01-30 DIAGNOSIS — E119 Type 2 diabetes mellitus without complications: Secondary | ICD-10-CM | POA: Diagnosis not present

## 2023-01-30 DIAGNOSIS — I10 Essential (primary) hypertension: Secondary | ICD-10-CM | POA: Diagnosis not present

## 2023-01-31 DIAGNOSIS — J9611 Chronic respiratory failure with hypoxia: Secondary | ICD-10-CM | POA: Diagnosis not present

## 2023-01-31 DIAGNOSIS — N390 Urinary tract infection, site not specified: Secondary | ICD-10-CM | POA: Diagnosis not present

## 2023-01-31 DIAGNOSIS — F0393 Unspecified dementia, unspecified severity, with mood disturbance: Secondary | ICD-10-CM | POA: Diagnosis not present

## 2023-01-31 DIAGNOSIS — J449 Chronic obstructive pulmonary disease, unspecified: Secondary | ICD-10-CM | POA: Diagnosis not present

## 2023-01-31 DIAGNOSIS — E119 Type 2 diabetes mellitus without complications: Secondary | ICD-10-CM | POA: Diagnosis not present

## 2023-01-31 DIAGNOSIS — I10 Essential (primary) hypertension: Secondary | ICD-10-CM | POA: Diagnosis not present

## 2023-02-01 DIAGNOSIS — E119 Type 2 diabetes mellitus without complications: Secondary | ICD-10-CM | POA: Diagnosis not present

## 2023-02-01 DIAGNOSIS — F0393 Unspecified dementia, unspecified severity, with mood disturbance: Secondary | ICD-10-CM | POA: Diagnosis not present

## 2023-02-01 DIAGNOSIS — I10 Essential (primary) hypertension: Secondary | ICD-10-CM | POA: Diagnosis not present

## 2023-02-01 DIAGNOSIS — N390 Urinary tract infection, site not specified: Secondary | ICD-10-CM | POA: Diagnosis not present

## 2023-02-01 DIAGNOSIS — J9611 Chronic respiratory failure with hypoxia: Secondary | ICD-10-CM | POA: Diagnosis not present

## 2023-02-01 DIAGNOSIS — J449 Chronic obstructive pulmonary disease, unspecified: Secondary | ICD-10-CM | POA: Diagnosis not present

## 2023-02-02 DIAGNOSIS — I10 Essential (primary) hypertension: Secondary | ICD-10-CM | POA: Diagnosis not present

## 2023-02-02 DIAGNOSIS — J449 Chronic obstructive pulmonary disease, unspecified: Secondary | ICD-10-CM | POA: Diagnosis not present

## 2023-02-02 DIAGNOSIS — F0393 Unspecified dementia, unspecified severity, with mood disturbance: Secondary | ICD-10-CM | POA: Diagnosis not present

## 2023-02-02 DIAGNOSIS — N390 Urinary tract infection, site not specified: Secondary | ICD-10-CM | POA: Diagnosis not present

## 2023-02-02 DIAGNOSIS — E119 Type 2 diabetes mellitus without complications: Secondary | ICD-10-CM | POA: Diagnosis not present

## 2023-02-02 DIAGNOSIS — J9611 Chronic respiratory failure with hypoxia: Secondary | ICD-10-CM | POA: Diagnosis not present

## 2023-02-03 DIAGNOSIS — E119 Type 2 diabetes mellitus without complications: Secondary | ICD-10-CM | POA: Diagnosis not present

## 2023-02-03 DIAGNOSIS — N390 Urinary tract infection, site not specified: Secondary | ICD-10-CM | POA: Diagnosis not present

## 2023-02-03 DIAGNOSIS — J9611 Chronic respiratory failure with hypoxia: Secondary | ICD-10-CM | POA: Diagnosis not present

## 2023-02-03 DIAGNOSIS — F0393 Unspecified dementia, unspecified severity, with mood disturbance: Secondary | ICD-10-CM | POA: Diagnosis not present

## 2023-02-03 DIAGNOSIS — I10 Essential (primary) hypertension: Secondary | ICD-10-CM | POA: Diagnosis not present

## 2023-02-03 DIAGNOSIS — J449 Chronic obstructive pulmonary disease, unspecified: Secondary | ICD-10-CM | POA: Diagnosis not present

## 2023-02-04 DIAGNOSIS — E119 Type 2 diabetes mellitus without complications: Secondary | ICD-10-CM | POA: Diagnosis not present

## 2023-02-04 DIAGNOSIS — F0393 Unspecified dementia, unspecified severity, with mood disturbance: Secondary | ICD-10-CM | POA: Diagnosis not present

## 2023-02-04 DIAGNOSIS — J9611 Chronic respiratory failure with hypoxia: Secondary | ICD-10-CM | POA: Diagnosis not present

## 2023-02-04 DIAGNOSIS — I10 Essential (primary) hypertension: Secondary | ICD-10-CM | POA: Diagnosis not present

## 2023-02-04 DIAGNOSIS — N390 Urinary tract infection, site not specified: Secondary | ICD-10-CM | POA: Diagnosis not present

## 2023-02-04 DIAGNOSIS — J449 Chronic obstructive pulmonary disease, unspecified: Secondary | ICD-10-CM | POA: Diagnosis not present

## 2023-02-05 DIAGNOSIS — I10 Essential (primary) hypertension: Secondary | ICD-10-CM | POA: Diagnosis not present

## 2023-02-05 DIAGNOSIS — N390 Urinary tract infection, site not specified: Secondary | ICD-10-CM | POA: Diagnosis not present

## 2023-02-05 DIAGNOSIS — J449 Chronic obstructive pulmonary disease, unspecified: Secondary | ICD-10-CM | POA: Diagnosis not present

## 2023-02-05 DIAGNOSIS — J9611 Chronic respiratory failure with hypoxia: Secondary | ICD-10-CM | POA: Diagnosis not present

## 2023-02-05 DIAGNOSIS — E119 Type 2 diabetes mellitus without complications: Secondary | ICD-10-CM | POA: Diagnosis not present

## 2023-02-05 DIAGNOSIS — F0393 Unspecified dementia, unspecified severity, with mood disturbance: Secondary | ICD-10-CM | POA: Diagnosis not present

## 2023-02-06 DIAGNOSIS — E119 Type 2 diabetes mellitus without complications: Secondary | ICD-10-CM | POA: Diagnosis not present

## 2023-02-06 DIAGNOSIS — N390 Urinary tract infection, site not specified: Secondary | ICD-10-CM | POA: Diagnosis not present

## 2023-02-06 DIAGNOSIS — J9611 Chronic respiratory failure with hypoxia: Secondary | ICD-10-CM | POA: Diagnosis not present

## 2023-02-06 DIAGNOSIS — J449 Chronic obstructive pulmonary disease, unspecified: Secondary | ICD-10-CM | POA: Diagnosis not present

## 2023-02-06 DIAGNOSIS — I10 Essential (primary) hypertension: Secondary | ICD-10-CM | POA: Diagnosis not present

## 2023-02-06 DIAGNOSIS — F0393 Unspecified dementia, unspecified severity, with mood disturbance: Secondary | ICD-10-CM | POA: Diagnosis not present

## 2023-02-07 DIAGNOSIS — I10 Essential (primary) hypertension: Secondary | ICD-10-CM | POA: Diagnosis not present

## 2023-02-07 DIAGNOSIS — J9611 Chronic respiratory failure with hypoxia: Secondary | ICD-10-CM | POA: Diagnosis not present

## 2023-02-07 DIAGNOSIS — F0393 Unspecified dementia, unspecified severity, with mood disturbance: Secondary | ICD-10-CM | POA: Diagnosis not present

## 2023-02-07 DIAGNOSIS — J449 Chronic obstructive pulmonary disease, unspecified: Secondary | ICD-10-CM | POA: Diagnosis not present

## 2023-02-07 DIAGNOSIS — E119 Type 2 diabetes mellitus without complications: Secondary | ICD-10-CM | POA: Diagnosis not present

## 2023-02-07 DIAGNOSIS — N390 Urinary tract infection, site not specified: Secondary | ICD-10-CM | POA: Diagnosis not present

## 2023-02-08 DIAGNOSIS — N390 Urinary tract infection, site not specified: Secondary | ICD-10-CM | POA: Diagnosis not present

## 2023-02-08 DIAGNOSIS — F0393 Unspecified dementia, unspecified severity, with mood disturbance: Secondary | ICD-10-CM | POA: Diagnosis not present

## 2023-02-08 DIAGNOSIS — J449 Chronic obstructive pulmonary disease, unspecified: Secondary | ICD-10-CM | POA: Diagnosis not present

## 2023-02-08 DIAGNOSIS — J9611 Chronic respiratory failure with hypoxia: Secondary | ICD-10-CM | POA: Diagnosis not present

## 2023-02-08 DIAGNOSIS — I10 Essential (primary) hypertension: Secondary | ICD-10-CM | POA: Diagnosis not present

## 2023-02-08 DIAGNOSIS — E119 Type 2 diabetes mellitus without complications: Secondary | ICD-10-CM | POA: Diagnosis not present

## 2023-02-09 DIAGNOSIS — I10 Essential (primary) hypertension: Secondary | ICD-10-CM | POA: Diagnosis not present

## 2023-02-09 DIAGNOSIS — N390 Urinary tract infection, site not specified: Secondary | ICD-10-CM | POA: Diagnosis not present

## 2023-02-09 DIAGNOSIS — F0393 Unspecified dementia, unspecified severity, with mood disturbance: Secondary | ICD-10-CM | POA: Diagnosis not present

## 2023-02-09 DIAGNOSIS — J449 Chronic obstructive pulmonary disease, unspecified: Secondary | ICD-10-CM | POA: Diagnosis not present

## 2023-02-09 DIAGNOSIS — E119 Type 2 diabetes mellitus without complications: Secondary | ICD-10-CM | POA: Diagnosis not present

## 2023-02-09 DIAGNOSIS — J9611 Chronic respiratory failure with hypoxia: Secondary | ICD-10-CM | POA: Diagnosis not present

## 2023-02-10 DIAGNOSIS — E119 Type 2 diabetes mellitus without complications: Secondary | ICD-10-CM | POA: Diagnosis not present

## 2023-02-10 DIAGNOSIS — F0393 Unspecified dementia, unspecified severity, with mood disturbance: Secondary | ICD-10-CM | POA: Diagnosis not present

## 2023-02-10 DIAGNOSIS — I10 Essential (primary) hypertension: Secondary | ICD-10-CM | POA: Diagnosis not present

## 2023-02-10 DIAGNOSIS — J449 Chronic obstructive pulmonary disease, unspecified: Secondary | ICD-10-CM | POA: Diagnosis not present

## 2023-02-10 DIAGNOSIS — N390 Urinary tract infection, site not specified: Secondary | ICD-10-CM | POA: Diagnosis not present

## 2023-02-10 DIAGNOSIS — J9611 Chronic respiratory failure with hypoxia: Secondary | ICD-10-CM | POA: Diagnosis not present

## 2023-02-11 DIAGNOSIS — I10 Essential (primary) hypertension: Secondary | ICD-10-CM | POA: Diagnosis not present

## 2023-02-11 DIAGNOSIS — E119 Type 2 diabetes mellitus without complications: Secondary | ICD-10-CM | POA: Diagnosis not present

## 2023-02-11 DIAGNOSIS — F0393 Unspecified dementia, unspecified severity, with mood disturbance: Secondary | ICD-10-CM | POA: Diagnosis not present

## 2023-02-11 DIAGNOSIS — N390 Urinary tract infection, site not specified: Secondary | ICD-10-CM | POA: Diagnosis not present

## 2023-02-11 DIAGNOSIS — J449 Chronic obstructive pulmonary disease, unspecified: Secondary | ICD-10-CM | POA: Diagnosis not present

## 2023-02-11 DIAGNOSIS — J9611 Chronic respiratory failure with hypoxia: Secondary | ICD-10-CM | POA: Diagnosis not present

## 2023-02-12 DIAGNOSIS — J9611 Chronic respiratory failure with hypoxia: Secondary | ICD-10-CM | POA: Diagnosis not present

## 2023-02-12 DIAGNOSIS — N390 Urinary tract infection, site not specified: Secondary | ICD-10-CM | POA: Diagnosis not present

## 2023-02-12 DIAGNOSIS — F0393 Unspecified dementia, unspecified severity, with mood disturbance: Secondary | ICD-10-CM | POA: Diagnosis not present

## 2023-02-12 DIAGNOSIS — R159 Full incontinence of feces: Secondary | ICD-10-CM | POA: Diagnosis not present

## 2023-02-12 DIAGNOSIS — E785 Hyperlipidemia, unspecified: Secondary | ICD-10-CM | POA: Diagnosis not present

## 2023-02-12 DIAGNOSIS — J449 Chronic obstructive pulmonary disease, unspecified: Secondary | ICD-10-CM | POA: Diagnosis not present

## 2023-02-12 DIAGNOSIS — E119 Type 2 diabetes mellitus without complications: Secondary | ICD-10-CM | POA: Diagnosis not present

## 2023-02-12 DIAGNOSIS — I10 Essential (primary) hypertension: Secondary | ICD-10-CM | POA: Diagnosis not present

## 2023-02-12 DIAGNOSIS — K2270 Barrett's esophagus without dysplasia: Secondary | ICD-10-CM | POA: Diagnosis not present

## 2023-02-12 DIAGNOSIS — K219 Gastro-esophageal reflux disease without esophagitis: Secondary | ICD-10-CM | POA: Diagnosis not present

## 2023-02-12 DIAGNOSIS — Z9981 Dependence on supplemental oxygen: Secondary | ICD-10-CM | POA: Diagnosis not present

## 2023-02-12 DIAGNOSIS — R32 Unspecified urinary incontinence: Secondary | ICD-10-CM | POA: Diagnosis not present

## 2023-02-13 DIAGNOSIS — I10 Essential (primary) hypertension: Secondary | ICD-10-CM | POA: Diagnosis not present

## 2023-02-13 DIAGNOSIS — F0393 Unspecified dementia, unspecified severity, with mood disturbance: Secondary | ICD-10-CM | POA: Diagnosis not present

## 2023-02-13 DIAGNOSIS — E119 Type 2 diabetes mellitus without complications: Secondary | ICD-10-CM | POA: Diagnosis not present

## 2023-02-13 DIAGNOSIS — N390 Urinary tract infection, site not specified: Secondary | ICD-10-CM | POA: Diagnosis not present

## 2023-02-13 DIAGNOSIS — J9611 Chronic respiratory failure with hypoxia: Secondary | ICD-10-CM | POA: Diagnosis not present

## 2023-02-13 DIAGNOSIS — J449 Chronic obstructive pulmonary disease, unspecified: Secondary | ICD-10-CM | POA: Diagnosis not present

## 2023-02-14 DIAGNOSIS — N390 Urinary tract infection, site not specified: Secondary | ICD-10-CM | POA: Diagnosis not present

## 2023-02-14 DIAGNOSIS — J449 Chronic obstructive pulmonary disease, unspecified: Secondary | ICD-10-CM | POA: Diagnosis not present

## 2023-02-14 DIAGNOSIS — E119 Type 2 diabetes mellitus without complications: Secondary | ICD-10-CM | POA: Diagnosis not present

## 2023-02-14 DIAGNOSIS — I10 Essential (primary) hypertension: Secondary | ICD-10-CM | POA: Diagnosis not present

## 2023-02-14 DIAGNOSIS — J9611 Chronic respiratory failure with hypoxia: Secondary | ICD-10-CM | POA: Diagnosis not present

## 2023-02-14 DIAGNOSIS — F0393 Unspecified dementia, unspecified severity, with mood disturbance: Secondary | ICD-10-CM | POA: Diagnosis not present

## 2023-02-15 DIAGNOSIS — J9611 Chronic respiratory failure with hypoxia: Secondary | ICD-10-CM | POA: Diagnosis not present

## 2023-02-15 DIAGNOSIS — N390 Urinary tract infection, site not specified: Secondary | ICD-10-CM | POA: Diagnosis not present

## 2023-02-15 DIAGNOSIS — F0393 Unspecified dementia, unspecified severity, with mood disturbance: Secondary | ICD-10-CM | POA: Diagnosis not present

## 2023-02-15 DIAGNOSIS — I10 Essential (primary) hypertension: Secondary | ICD-10-CM | POA: Diagnosis not present

## 2023-02-15 DIAGNOSIS — J449 Chronic obstructive pulmonary disease, unspecified: Secondary | ICD-10-CM | POA: Diagnosis not present

## 2023-02-15 DIAGNOSIS — E119 Type 2 diabetes mellitus without complications: Secondary | ICD-10-CM | POA: Diagnosis not present

## 2023-02-16 DIAGNOSIS — E119 Type 2 diabetes mellitus without complications: Secondary | ICD-10-CM | POA: Diagnosis not present

## 2023-02-16 DIAGNOSIS — N390 Urinary tract infection, site not specified: Secondary | ICD-10-CM | POA: Diagnosis not present

## 2023-02-16 DIAGNOSIS — J9611 Chronic respiratory failure with hypoxia: Secondary | ICD-10-CM | POA: Diagnosis not present

## 2023-02-16 DIAGNOSIS — J449 Chronic obstructive pulmonary disease, unspecified: Secondary | ICD-10-CM | POA: Diagnosis not present

## 2023-02-16 DIAGNOSIS — I10 Essential (primary) hypertension: Secondary | ICD-10-CM | POA: Diagnosis not present

## 2023-02-16 DIAGNOSIS — F0393 Unspecified dementia, unspecified severity, with mood disturbance: Secondary | ICD-10-CM | POA: Diagnosis not present

## 2023-02-17 DIAGNOSIS — I10 Essential (primary) hypertension: Secondary | ICD-10-CM | POA: Diagnosis not present

## 2023-02-17 DIAGNOSIS — E119 Type 2 diabetes mellitus without complications: Secondary | ICD-10-CM | POA: Diagnosis not present

## 2023-02-17 DIAGNOSIS — F0393 Unspecified dementia, unspecified severity, with mood disturbance: Secondary | ICD-10-CM | POA: Diagnosis not present

## 2023-02-17 DIAGNOSIS — N390 Urinary tract infection, site not specified: Secondary | ICD-10-CM | POA: Diagnosis not present

## 2023-02-17 DIAGNOSIS — J9611 Chronic respiratory failure with hypoxia: Secondary | ICD-10-CM | POA: Diagnosis not present

## 2023-02-17 DIAGNOSIS — J449 Chronic obstructive pulmonary disease, unspecified: Secondary | ICD-10-CM | POA: Diagnosis not present

## 2023-02-19 DIAGNOSIS — N390 Urinary tract infection, site not specified: Secondary | ICD-10-CM | POA: Diagnosis not present

## 2023-02-19 DIAGNOSIS — F0393 Unspecified dementia, unspecified severity, with mood disturbance: Secondary | ICD-10-CM | POA: Diagnosis not present

## 2023-02-19 DIAGNOSIS — I10 Essential (primary) hypertension: Secondary | ICD-10-CM | POA: Diagnosis not present

## 2023-02-19 DIAGNOSIS — J9611 Chronic respiratory failure with hypoxia: Secondary | ICD-10-CM | POA: Diagnosis not present

## 2023-02-19 DIAGNOSIS — E119 Type 2 diabetes mellitus without complications: Secondary | ICD-10-CM | POA: Diagnosis not present

## 2023-02-19 DIAGNOSIS — J449 Chronic obstructive pulmonary disease, unspecified: Secondary | ICD-10-CM | POA: Diagnosis not present

## 2023-02-23 DIAGNOSIS — I10 Essential (primary) hypertension: Secondary | ICD-10-CM | POA: Diagnosis not present

## 2023-02-23 DIAGNOSIS — N390 Urinary tract infection, site not specified: Secondary | ICD-10-CM | POA: Diagnosis not present

## 2023-02-23 DIAGNOSIS — J449 Chronic obstructive pulmonary disease, unspecified: Secondary | ICD-10-CM | POA: Diagnosis not present

## 2023-02-23 DIAGNOSIS — F0393 Unspecified dementia, unspecified severity, with mood disturbance: Secondary | ICD-10-CM | POA: Diagnosis not present

## 2023-02-23 DIAGNOSIS — J9611 Chronic respiratory failure with hypoxia: Secondary | ICD-10-CM | POA: Diagnosis not present

## 2023-02-23 DIAGNOSIS — E119 Type 2 diabetes mellitus without complications: Secondary | ICD-10-CM | POA: Diagnosis not present

## 2023-02-24 DIAGNOSIS — E119 Type 2 diabetes mellitus without complications: Secondary | ICD-10-CM | POA: Diagnosis not present

## 2023-02-24 DIAGNOSIS — N390 Urinary tract infection, site not specified: Secondary | ICD-10-CM | POA: Diagnosis not present

## 2023-02-24 DIAGNOSIS — J449 Chronic obstructive pulmonary disease, unspecified: Secondary | ICD-10-CM | POA: Diagnosis not present

## 2023-02-24 DIAGNOSIS — F0393 Unspecified dementia, unspecified severity, with mood disturbance: Secondary | ICD-10-CM | POA: Diagnosis not present

## 2023-02-24 DIAGNOSIS — I10 Essential (primary) hypertension: Secondary | ICD-10-CM | POA: Diagnosis not present

## 2023-02-24 DIAGNOSIS — J9611 Chronic respiratory failure with hypoxia: Secondary | ICD-10-CM | POA: Diagnosis not present

## 2023-02-26 DIAGNOSIS — J9611 Chronic respiratory failure with hypoxia: Secondary | ICD-10-CM | POA: Diagnosis not present

## 2023-02-26 DIAGNOSIS — F0393 Unspecified dementia, unspecified severity, with mood disturbance: Secondary | ICD-10-CM | POA: Diagnosis not present

## 2023-02-26 DIAGNOSIS — N390 Urinary tract infection, site not specified: Secondary | ICD-10-CM | POA: Diagnosis not present

## 2023-02-26 DIAGNOSIS — I10 Essential (primary) hypertension: Secondary | ICD-10-CM | POA: Diagnosis not present

## 2023-02-26 DIAGNOSIS — J449 Chronic obstructive pulmonary disease, unspecified: Secondary | ICD-10-CM | POA: Diagnosis not present

## 2023-02-26 DIAGNOSIS — E119 Type 2 diabetes mellitus without complications: Secondary | ICD-10-CM | POA: Diagnosis not present

## 2023-02-27 DIAGNOSIS — F0393 Unspecified dementia, unspecified severity, with mood disturbance: Secondary | ICD-10-CM | POA: Diagnosis not present

## 2023-02-27 DIAGNOSIS — E119 Type 2 diabetes mellitus without complications: Secondary | ICD-10-CM | POA: Diagnosis not present

## 2023-02-27 DIAGNOSIS — I10 Essential (primary) hypertension: Secondary | ICD-10-CM | POA: Diagnosis not present

## 2023-02-27 DIAGNOSIS — J9611 Chronic respiratory failure with hypoxia: Secondary | ICD-10-CM | POA: Diagnosis not present

## 2023-02-27 DIAGNOSIS — N390 Urinary tract infection, site not specified: Secondary | ICD-10-CM | POA: Diagnosis not present

## 2023-02-27 DIAGNOSIS — J449 Chronic obstructive pulmonary disease, unspecified: Secondary | ICD-10-CM | POA: Diagnosis not present

## 2023-03-02 DIAGNOSIS — N390 Urinary tract infection, site not specified: Secondary | ICD-10-CM | POA: Diagnosis not present

## 2023-03-02 DIAGNOSIS — J449 Chronic obstructive pulmonary disease, unspecified: Secondary | ICD-10-CM | POA: Diagnosis not present

## 2023-03-02 DIAGNOSIS — E119 Type 2 diabetes mellitus without complications: Secondary | ICD-10-CM | POA: Diagnosis not present

## 2023-03-02 DIAGNOSIS — F0393 Unspecified dementia, unspecified severity, with mood disturbance: Secondary | ICD-10-CM | POA: Diagnosis not present

## 2023-03-02 DIAGNOSIS — J9611 Chronic respiratory failure with hypoxia: Secondary | ICD-10-CM | POA: Diagnosis not present

## 2023-03-02 DIAGNOSIS — I10 Essential (primary) hypertension: Secondary | ICD-10-CM | POA: Diagnosis not present

## 2023-03-03 DIAGNOSIS — F0393 Unspecified dementia, unspecified severity, with mood disturbance: Secondary | ICD-10-CM | POA: Diagnosis not present

## 2023-03-03 DIAGNOSIS — N390 Urinary tract infection, site not specified: Secondary | ICD-10-CM | POA: Diagnosis not present

## 2023-03-03 DIAGNOSIS — I10 Essential (primary) hypertension: Secondary | ICD-10-CM | POA: Diagnosis not present

## 2023-03-03 DIAGNOSIS — E119 Type 2 diabetes mellitus without complications: Secondary | ICD-10-CM | POA: Diagnosis not present

## 2023-03-03 DIAGNOSIS — J9611 Chronic respiratory failure with hypoxia: Secondary | ICD-10-CM | POA: Diagnosis not present

## 2023-03-03 DIAGNOSIS — J449 Chronic obstructive pulmonary disease, unspecified: Secondary | ICD-10-CM | POA: Diagnosis not present

## 2023-03-05 DIAGNOSIS — E119 Type 2 diabetes mellitus without complications: Secondary | ICD-10-CM | POA: Diagnosis not present

## 2023-03-05 DIAGNOSIS — I10 Essential (primary) hypertension: Secondary | ICD-10-CM | POA: Diagnosis not present

## 2023-03-05 DIAGNOSIS — F0393 Unspecified dementia, unspecified severity, with mood disturbance: Secondary | ICD-10-CM | POA: Diagnosis not present

## 2023-03-05 DIAGNOSIS — J449 Chronic obstructive pulmonary disease, unspecified: Secondary | ICD-10-CM | POA: Diagnosis not present

## 2023-03-05 DIAGNOSIS — N390 Urinary tract infection, site not specified: Secondary | ICD-10-CM | POA: Diagnosis not present

## 2023-03-05 DIAGNOSIS — J9611 Chronic respiratory failure with hypoxia: Secondary | ICD-10-CM | POA: Diagnosis not present

## 2023-03-09 DIAGNOSIS — J9611 Chronic respiratory failure with hypoxia: Secondary | ICD-10-CM | POA: Diagnosis not present

## 2023-03-09 DIAGNOSIS — J449 Chronic obstructive pulmonary disease, unspecified: Secondary | ICD-10-CM | POA: Diagnosis not present

## 2023-03-09 DIAGNOSIS — E119 Type 2 diabetes mellitus without complications: Secondary | ICD-10-CM | POA: Diagnosis not present

## 2023-03-09 DIAGNOSIS — F0393 Unspecified dementia, unspecified severity, with mood disturbance: Secondary | ICD-10-CM | POA: Diagnosis not present

## 2023-03-09 DIAGNOSIS — I10 Essential (primary) hypertension: Secondary | ICD-10-CM | POA: Diagnosis not present

## 2023-03-09 DIAGNOSIS — N390 Urinary tract infection, site not specified: Secondary | ICD-10-CM | POA: Diagnosis not present

## 2023-03-10 DIAGNOSIS — I10 Essential (primary) hypertension: Secondary | ICD-10-CM | POA: Diagnosis not present

## 2023-03-10 DIAGNOSIS — J9611 Chronic respiratory failure with hypoxia: Secondary | ICD-10-CM | POA: Diagnosis not present

## 2023-03-10 DIAGNOSIS — E119 Type 2 diabetes mellitus without complications: Secondary | ICD-10-CM | POA: Diagnosis not present

## 2023-03-10 DIAGNOSIS — N390 Urinary tract infection, site not specified: Secondary | ICD-10-CM | POA: Diagnosis not present

## 2023-03-10 DIAGNOSIS — J449 Chronic obstructive pulmonary disease, unspecified: Secondary | ICD-10-CM | POA: Diagnosis not present

## 2023-03-10 DIAGNOSIS — F0393 Unspecified dementia, unspecified severity, with mood disturbance: Secondary | ICD-10-CM | POA: Diagnosis not present

## 2023-03-12 DIAGNOSIS — E119 Type 2 diabetes mellitus without complications: Secondary | ICD-10-CM | POA: Diagnosis not present

## 2023-03-12 DIAGNOSIS — I10 Essential (primary) hypertension: Secondary | ICD-10-CM | POA: Diagnosis not present

## 2023-03-12 DIAGNOSIS — F0393 Unspecified dementia, unspecified severity, with mood disturbance: Secondary | ICD-10-CM | POA: Diagnosis not present

## 2023-03-12 DIAGNOSIS — J9611 Chronic respiratory failure with hypoxia: Secondary | ICD-10-CM | POA: Diagnosis not present

## 2023-03-12 DIAGNOSIS — J449 Chronic obstructive pulmonary disease, unspecified: Secondary | ICD-10-CM | POA: Diagnosis not present

## 2023-03-12 DIAGNOSIS — N390 Urinary tract infection, site not specified: Secondary | ICD-10-CM | POA: Diagnosis not present

## 2023-03-14 DIAGNOSIS — E785 Hyperlipidemia, unspecified: Secondary | ICD-10-CM | POA: Diagnosis not present

## 2023-03-14 DIAGNOSIS — R32 Unspecified urinary incontinence: Secondary | ICD-10-CM | POA: Diagnosis not present

## 2023-03-14 DIAGNOSIS — F0393 Unspecified dementia, unspecified severity, with mood disturbance: Secondary | ICD-10-CM | POA: Diagnosis not present

## 2023-03-14 DIAGNOSIS — Z9981 Dependence on supplemental oxygen: Secondary | ICD-10-CM | POA: Diagnosis not present

## 2023-03-14 DIAGNOSIS — E119 Type 2 diabetes mellitus without complications: Secondary | ICD-10-CM | POA: Diagnosis not present

## 2023-03-14 DIAGNOSIS — K219 Gastro-esophageal reflux disease without esophagitis: Secondary | ICD-10-CM | POA: Diagnosis not present

## 2023-03-14 DIAGNOSIS — K2270 Barrett's esophagus without dysplasia: Secondary | ICD-10-CM | POA: Diagnosis not present

## 2023-03-14 DIAGNOSIS — J449 Chronic obstructive pulmonary disease, unspecified: Secondary | ICD-10-CM | POA: Diagnosis not present

## 2023-03-14 DIAGNOSIS — I10 Essential (primary) hypertension: Secondary | ICD-10-CM | POA: Diagnosis not present

## 2023-03-14 DIAGNOSIS — N390 Urinary tract infection, site not specified: Secondary | ICD-10-CM | POA: Diagnosis not present

## 2023-03-14 DIAGNOSIS — R159 Full incontinence of feces: Secondary | ICD-10-CM | POA: Diagnosis not present

## 2023-03-14 DIAGNOSIS — J9611 Chronic respiratory failure with hypoxia: Secondary | ICD-10-CM | POA: Diagnosis not present

## 2023-03-15 DIAGNOSIS — J449 Chronic obstructive pulmonary disease, unspecified: Secondary | ICD-10-CM | POA: Diagnosis not present

## 2023-03-15 DIAGNOSIS — F0393 Unspecified dementia, unspecified severity, with mood disturbance: Secondary | ICD-10-CM | POA: Diagnosis not present

## 2023-03-15 DIAGNOSIS — E119 Type 2 diabetes mellitus without complications: Secondary | ICD-10-CM | POA: Diagnosis not present

## 2023-03-15 DIAGNOSIS — N390 Urinary tract infection, site not specified: Secondary | ICD-10-CM | POA: Diagnosis not present

## 2023-03-15 DIAGNOSIS — J9611 Chronic respiratory failure with hypoxia: Secondary | ICD-10-CM | POA: Diagnosis not present

## 2023-03-15 DIAGNOSIS — I10 Essential (primary) hypertension: Secondary | ICD-10-CM | POA: Diagnosis not present

## 2023-03-16 DIAGNOSIS — F0393 Unspecified dementia, unspecified severity, with mood disturbance: Secondary | ICD-10-CM | POA: Diagnosis not present

## 2023-03-16 DIAGNOSIS — N390 Urinary tract infection, site not specified: Secondary | ICD-10-CM | POA: Diagnosis not present

## 2023-03-16 DIAGNOSIS — J9611 Chronic respiratory failure with hypoxia: Secondary | ICD-10-CM | POA: Diagnosis not present

## 2023-03-16 DIAGNOSIS — J449 Chronic obstructive pulmonary disease, unspecified: Secondary | ICD-10-CM | POA: Diagnosis not present

## 2023-03-16 DIAGNOSIS — E119 Type 2 diabetes mellitus without complications: Secondary | ICD-10-CM | POA: Diagnosis not present

## 2023-03-16 DIAGNOSIS — I10 Essential (primary) hypertension: Secondary | ICD-10-CM | POA: Diagnosis not present

## 2023-03-17 DIAGNOSIS — F0393 Unspecified dementia, unspecified severity, with mood disturbance: Secondary | ICD-10-CM | POA: Diagnosis not present

## 2023-03-17 DIAGNOSIS — J449 Chronic obstructive pulmonary disease, unspecified: Secondary | ICD-10-CM | POA: Diagnosis not present

## 2023-03-17 DIAGNOSIS — N390 Urinary tract infection, site not specified: Secondary | ICD-10-CM | POA: Diagnosis not present

## 2023-03-17 DIAGNOSIS — J9611 Chronic respiratory failure with hypoxia: Secondary | ICD-10-CM | POA: Diagnosis not present

## 2023-03-17 DIAGNOSIS — I10 Essential (primary) hypertension: Secondary | ICD-10-CM | POA: Diagnosis not present

## 2023-03-17 DIAGNOSIS — E119 Type 2 diabetes mellitus without complications: Secondary | ICD-10-CM | POA: Diagnosis not present

## 2023-03-18 DIAGNOSIS — I10 Essential (primary) hypertension: Secondary | ICD-10-CM | POA: Diagnosis not present

## 2023-03-18 DIAGNOSIS — E119 Type 2 diabetes mellitus without complications: Secondary | ICD-10-CM | POA: Diagnosis not present

## 2023-03-18 DIAGNOSIS — N390 Urinary tract infection, site not specified: Secondary | ICD-10-CM | POA: Diagnosis not present

## 2023-03-18 DIAGNOSIS — F0393 Unspecified dementia, unspecified severity, with mood disturbance: Secondary | ICD-10-CM | POA: Diagnosis not present

## 2023-03-18 DIAGNOSIS — J449 Chronic obstructive pulmonary disease, unspecified: Secondary | ICD-10-CM | POA: Diagnosis not present

## 2023-03-18 DIAGNOSIS — J9611 Chronic respiratory failure with hypoxia: Secondary | ICD-10-CM | POA: Diagnosis not present

## 2023-03-19 DIAGNOSIS — J449 Chronic obstructive pulmonary disease, unspecified: Secondary | ICD-10-CM | POA: Diagnosis not present

## 2023-03-19 DIAGNOSIS — E119 Type 2 diabetes mellitus without complications: Secondary | ICD-10-CM | POA: Diagnosis not present

## 2023-03-19 DIAGNOSIS — F0393 Unspecified dementia, unspecified severity, with mood disturbance: Secondary | ICD-10-CM | POA: Diagnosis not present

## 2023-03-19 DIAGNOSIS — J9611 Chronic respiratory failure with hypoxia: Secondary | ICD-10-CM | POA: Diagnosis not present

## 2023-03-19 DIAGNOSIS — N390 Urinary tract infection, site not specified: Secondary | ICD-10-CM | POA: Diagnosis not present

## 2023-03-19 DIAGNOSIS — I10 Essential (primary) hypertension: Secondary | ICD-10-CM | POA: Diagnosis not present

## 2023-03-20 DIAGNOSIS — E119 Type 2 diabetes mellitus without complications: Secondary | ICD-10-CM | POA: Diagnosis not present

## 2023-03-20 DIAGNOSIS — F0393 Unspecified dementia, unspecified severity, with mood disturbance: Secondary | ICD-10-CM | POA: Diagnosis not present

## 2023-03-20 DIAGNOSIS — J449 Chronic obstructive pulmonary disease, unspecified: Secondary | ICD-10-CM | POA: Diagnosis not present

## 2023-03-20 DIAGNOSIS — I10 Essential (primary) hypertension: Secondary | ICD-10-CM | POA: Diagnosis not present

## 2023-03-20 DIAGNOSIS — J9611 Chronic respiratory failure with hypoxia: Secondary | ICD-10-CM | POA: Diagnosis not present

## 2023-03-20 DIAGNOSIS — N390 Urinary tract infection, site not specified: Secondary | ICD-10-CM | POA: Diagnosis not present

## 2023-03-21 DIAGNOSIS — I10 Essential (primary) hypertension: Secondary | ICD-10-CM | POA: Diagnosis not present

## 2023-03-21 DIAGNOSIS — E119 Type 2 diabetes mellitus without complications: Secondary | ICD-10-CM | POA: Diagnosis not present

## 2023-03-21 DIAGNOSIS — J449 Chronic obstructive pulmonary disease, unspecified: Secondary | ICD-10-CM | POA: Diagnosis not present

## 2023-03-21 DIAGNOSIS — F0393 Unspecified dementia, unspecified severity, with mood disturbance: Secondary | ICD-10-CM | POA: Diagnosis not present

## 2023-03-21 DIAGNOSIS — J9611 Chronic respiratory failure with hypoxia: Secondary | ICD-10-CM | POA: Diagnosis not present

## 2023-03-21 DIAGNOSIS — N390 Urinary tract infection, site not specified: Secondary | ICD-10-CM | POA: Diagnosis not present

## 2023-03-22 DIAGNOSIS — J9611 Chronic respiratory failure with hypoxia: Secondary | ICD-10-CM | POA: Diagnosis not present

## 2023-03-22 DIAGNOSIS — N390 Urinary tract infection, site not specified: Secondary | ICD-10-CM | POA: Diagnosis not present

## 2023-03-22 DIAGNOSIS — J449 Chronic obstructive pulmonary disease, unspecified: Secondary | ICD-10-CM | POA: Diagnosis not present

## 2023-03-22 DIAGNOSIS — I10 Essential (primary) hypertension: Secondary | ICD-10-CM | POA: Diagnosis not present

## 2023-03-22 DIAGNOSIS — E119 Type 2 diabetes mellitus without complications: Secondary | ICD-10-CM | POA: Diagnosis not present

## 2023-03-22 DIAGNOSIS — F0393 Unspecified dementia, unspecified severity, with mood disturbance: Secondary | ICD-10-CM | POA: Diagnosis not present

## 2023-03-23 DIAGNOSIS — E119 Type 2 diabetes mellitus without complications: Secondary | ICD-10-CM | POA: Diagnosis not present

## 2023-03-23 DIAGNOSIS — F0393 Unspecified dementia, unspecified severity, with mood disturbance: Secondary | ICD-10-CM | POA: Diagnosis not present

## 2023-03-23 DIAGNOSIS — N390 Urinary tract infection, site not specified: Secondary | ICD-10-CM | POA: Diagnosis not present

## 2023-03-23 DIAGNOSIS — J449 Chronic obstructive pulmonary disease, unspecified: Secondary | ICD-10-CM | POA: Diagnosis not present

## 2023-03-23 DIAGNOSIS — I10 Essential (primary) hypertension: Secondary | ICD-10-CM | POA: Diagnosis not present

## 2023-03-23 DIAGNOSIS — J9611 Chronic respiratory failure with hypoxia: Secondary | ICD-10-CM | POA: Diagnosis not present

## 2023-03-24 DIAGNOSIS — I10 Essential (primary) hypertension: Secondary | ICD-10-CM | POA: Diagnosis not present

## 2023-03-24 DIAGNOSIS — J9611 Chronic respiratory failure with hypoxia: Secondary | ICD-10-CM | POA: Diagnosis not present

## 2023-03-24 DIAGNOSIS — J449 Chronic obstructive pulmonary disease, unspecified: Secondary | ICD-10-CM | POA: Diagnosis not present

## 2023-03-24 DIAGNOSIS — N390 Urinary tract infection, site not specified: Secondary | ICD-10-CM | POA: Diagnosis not present

## 2023-03-24 DIAGNOSIS — F0393 Unspecified dementia, unspecified severity, with mood disturbance: Secondary | ICD-10-CM | POA: Diagnosis not present

## 2023-03-24 DIAGNOSIS — E119 Type 2 diabetes mellitus without complications: Secondary | ICD-10-CM | POA: Diagnosis not present

## 2023-03-25 DIAGNOSIS — N390 Urinary tract infection, site not specified: Secondary | ICD-10-CM | POA: Diagnosis not present

## 2023-03-25 DIAGNOSIS — I10 Essential (primary) hypertension: Secondary | ICD-10-CM | POA: Diagnosis not present

## 2023-03-25 DIAGNOSIS — F0393 Unspecified dementia, unspecified severity, with mood disturbance: Secondary | ICD-10-CM | POA: Diagnosis not present

## 2023-03-25 DIAGNOSIS — J449 Chronic obstructive pulmonary disease, unspecified: Secondary | ICD-10-CM | POA: Diagnosis not present

## 2023-03-25 DIAGNOSIS — E119 Type 2 diabetes mellitus without complications: Secondary | ICD-10-CM | POA: Diagnosis not present

## 2023-03-25 DIAGNOSIS — J9611 Chronic respiratory failure with hypoxia: Secondary | ICD-10-CM | POA: Diagnosis not present

## 2023-03-26 DIAGNOSIS — J449 Chronic obstructive pulmonary disease, unspecified: Secondary | ICD-10-CM | POA: Diagnosis not present

## 2023-03-26 DIAGNOSIS — F0393 Unspecified dementia, unspecified severity, with mood disturbance: Secondary | ICD-10-CM | POA: Diagnosis not present

## 2023-03-26 DIAGNOSIS — E119 Type 2 diabetes mellitus without complications: Secondary | ICD-10-CM | POA: Diagnosis not present

## 2023-03-26 DIAGNOSIS — I10 Essential (primary) hypertension: Secondary | ICD-10-CM | POA: Diagnosis not present

## 2023-03-26 DIAGNOSIS — N390 Urinary tract infection, site not specified: Secondary | ICD-10-CM | POA: Diagnosis not present

## 2023-03-26 DIAGNOSIS — J9611 Chronic respiratory failure with hypoxia: Secondary | ICD-10-CM | POA: Diagnosis not present

## 2023-03-27 DIAGNOSIS — J9611 Chronic respiratory failure with hypoxia: Secondary | ICD-10-CM | POA: Diagnosis not present

## 2023-03-27 DIAGNOSIS — F0393 Unspecified dementia, unspecified severity, with mood disturbance: Secondary | ICD-10-CM | POA: Diagnosis not present

## 2023-03-27 DIAGNOSIS — J449 Chronic obstructive pulmonary disease, unspecified: Secondary | ICD-10-CM | POA: Diagnosis not present

## 2023-03-27 DIAGNOSIS — E119 Type 2 diabetes mellitus without complications: Secondary | ICD-10-CM | POA: Diagnosis not present

## 2023-03-27 DIAGNOSIS — N390 Urinary tract infection, site not specified: Secondary | ICD-10-CM | POA: Diagnosis not present

## 2023-03-27 DIAGNOSIS — I10 Essential (primary) hypertension: Secondary | ICD-10-CM | POA: Diagnosis not present

## 2023-03-28 DIAGNOSIS — J9611 Chronic respiratory failure with hypoxia: Secondary | ICD-10-CM | POA: Diagnosis not present

## 2023-03-28 DIAGNOSIS — I10 Essential (primary) hypertension: Secondary | ICD-10-CM | POA: Diagnosis not present

## 2023-03-28 DIAGNOSIS — E119 Type 2 diabetes mellitus without complications: Secondary | ICD-10-CM | POA: Diagnosis not present

## 2023-03-28 DIAGNOSIS — F0393 Unspecified dementia, unspecified severity, with mood disturbance: Secondary | ICD-10-CM | POA: Diagnosis not present

## 2023-03-28 DIAGNOSIS — N390 Urinary tract infection, site not specified: Secondary | ICD-10-CM | POA: Diagnosis not present

## 2023-03-28 DIAGNOSIS — J449 Chronic obstructive pulmonary disease, unspecified: Secondary | ICD-10-CM | POA: Diagnosis not present

## 2023-03-29 DIAGNOSIS — R739 Hyperglycemia, unspecified: Secondary | ICD-10-CM | POA: Diagnosis not present

## 2023-03-29 DIAGNOSIS — R0902 Hypoxemia: Secondary | ICD-10-CM | POA: Diagnosis not present

## 2023-03-29 DIAGNOSIS — R404 Transient alteration of awareness: Secondary | ICD-10-CM | POA: Diagnosis not present

## 2023-03-29 DIAGNOSIS — N390 Urinary tract infection, site not specified: Secondary | ICD-10-CM | POA: Diagnosis not present

## 2023-03-29 DIAGNOSIS — E119 Type 2 diabetes mellitus without complications: Secondary | ICD-10-CM | POA: Diagnosis not present

## 2023-03-29 DIAGNOSIS — R000 Tachycardia, unspecified: Secondary | ICD-10-CM | POA: Diagnosis not present

## 2023-03-29 DIAGNOSIS — J9611 Chronic respiratory failure with hypoxia: Secondary | ICD-10-CM | POA: Diagnosis not present

## 2023-03-29 DIAGNOSIS — F0393 Unspecified dementia, unspecified severity, with mood disturbance: Secondary | ICD-10-CM | POA: Diagnosis not present

## 2023-03-29 DIAGNOSIS — I10 Essential (primary) hypertension: Secondary | ICD-10-CM | POA: Diagnosis not present

## 2023-03-29 DIAGNOSIS — J449 Chronic obstructive pulmonary disease, unspecified: Secondary | ICD-10-CM | POA: Diagnosis not present

## 2023-04-14 DEATH — deceased
